# Patient Record
Sex: Male | Born: 1945 | Race: White | Hispanic: No | Marital: Married | Smoking: Former smoker
Health system: Southern US, Community
[De-identification: ages and names within clinical notes are randomized; demographics above are authoritative.]

## PROBLEM LIST (undated history)

## (undated) DIAGNOSIS — T7840XA Allergy, unspecified, initial encounter: Secondary | ICD-10-CM

## (undated) DIAGNOSIS — K589 Irritable bowel syndrome without diarrhea: Secondary | ICD-10-CM

## (undated) DIAGNOSIS — D496 Neoplasm of unspecified behavior of brain: Secondary | ICD-10-CM

## (undated) DIAGNOSIS — C61 Malignant neoplasm of prostate: Secondary | ICD-10-CM

## (undated) DIAGNOSIS — G4730 Sleep apnea, unspecified: Secondary | ICD-10-CM

## (undated) DIAGNOSIS — E7800 Pure hypercholesterolemia, unspecified: Secondary | ICD-10-CM

## (undated) DIAGNOSIS — B029 Zoster without complications: Secondary | ICD-10-CM

## (undated) DIAGNOSIS — G459 Transient cerebral ischemic attack, unspecified: Secondary | ICD-10-CM

## (undated) DIAGNOSIS — I639 Cerebral infarction, unspecified: Secondary | ICD-10-CM

## (undated) DIAGNOSIS — R569 Unspecified convulsions: Secondary | ICD-10-CM

## (undated) DIAGNOSIS — T4145XA Adverse effect of unspecified anesthetic, initial encounter: Secondary | ICD-10-CM

## (undated) DIAGNOSIS — T8859XA Other complications of anesthesia, initial encounter: Secondary | ICD-10-CM

## (undated) HISTORY — PX: JOINT REPLACEMENT: SHX530

## (undated) HISTORY — DX: Allergy, unspecified, initial encounter: T78.40XA

---

## 1978-11-04 HISTORY — PX: UVULOPALATOPHARYNGOPLASTY: SHX827

## 1988-11-03 HISTORY — PX: PROSTATECTOMY: SHX69

## 1998-04-22 ENCOUNTER — Encounter: Payer: Self-pay | Admitting: Neurology

## 1998-04-22 ENCOUNTER — Ambulatory Visit (HOSPITAL_COMMUNITY): Admission: RE | Admit: 1998-04-22 | Discharge: 1998-04-22 | Payer: Self-pay | Admitting: Neurology

## 1998-11-04 HISTORY — PX: SHOULDER ARTHROSCOPY: SHX128

## 1998-11-04 HISTORY — PX: CATARACT EXTRACTION W/ INTRAOCULAR LENS IMPLANT: SHX1309

## 1998-11-04 HISTORY — PX: TOTAL KNEE ARTHROPLASTY: SHX125

## 2000-10-17 ENCOUNTER — Encounter (INDEPENDENT_AMBULATORY_CARE_PROVIDER_SITE_OTHER): Payer: Self-pay | Admitting: Specialist

## 2000-10-17 ENCOUNTER — Other Ambulatory Visit: Admission: RE | Admit: 2000-10-17 | Discharge: 2000-10-17 | Payer: Self-pay | Admitting: Urology

## 2000-10-22 ENCOUNTER — Encounter: Payer: Self-pay | Admitting: Urology

## 2000-10-22 ENCOUNTER — Encounter: Admission: RE | Admit: 2000-10-22 | Discharge: 2000-10-22 | Payer: Self-pay | Admitting: Urology

## 2002-01-23 ENCOUNTER — Encounter (INDEPENDENT_AMBULATORY_CARE_PROVIDER_SITE_OTHER): Payer: Self-pay | Admitting: Specialist

## 2002-01-23 ENCOUNTER — Ambulatory Visit (HOSPITAL_COMMUNITY): Admission: RE | Admit: 2002-01-23 | Discharge: 2002-01-23 | Payer: Self-pay | Admitting: Gastroenterology

## 2002-08-10 ENCOUNTER — Encounter: Admission: RE | Admit: 2002-08-10 | Discharge: 2002-08-25 | Payer: Self-pay | Admitting: Family Medicine

## 2007-12-19 ENCOUNTER — Ambulatory Visit (HOSPITAL_BASED_OUTPATIENT_CLINIC_OR_DEPARTMENT_OTHER): Admission: RE | Admit: 2007-12-19 | Discharge: 2007-12-19 | Payer: Self-pay | Admitting: Specialist

## 2009-03-07 ENCOUNTER — Inpatient Hospital Stay (HOSPITAL_COMMUNITY): Admission: RE | Admit: 2009-03-07 | Discharge: 2009-03-12 | Payer: Self-pay | Admitting: Specialist

## 2009-03-07 ENCOUNTER — Encounter (INDEPENDENT_AMBULATORY_CARE_PROVIDER_SITE_OTHER): Payer: Self-pay | Admitting: Specialist

## 2010-05-21 LAB — BASIC METABOLIC PANEL
BUN: 12 mg/dL (ref 6–23)
BUN: 8 mg/dL (ref 6–23)
CO2: 28 mEq/L (ref 19–32)
CO2: 29 mEq/L (ref 19–32)
CO2: 29 mEq/L (ref 19–32)
Calcium: 8.6 mg/dL (ref 8.4–10.5)
Chloride: 101 mEq/L (ref 96–112)
Chloride: 101 mEq/L (ref 96–112)
Chloride: 101 mEq/L (ref 96–112)
Chloride: 101 mEq/L (ref 96–112)
Creatinine, Ser: 0.81 mg/dL (ref 0.4–1.5)
GFR calc non Af Amer: >60 mL/min (ref >60)
Glucose, Bld: 96 mg/dL (ref 70–99)
Potassium: 3.2 mEq/L — ABNORMAL LOW (ref 3.5–5.1)
Potassium: 3.3 mEq/L — ABNORMAL LOW (ref 3.5–5.1)
Potassium: 3.6 mEq/L (ref 3.5–5.1)
Potassium: 4.1 mEq/L (ref 3.5–5.1)
Sodium: 135 mEq/L (ref 135–145)
Sodium: 136 mEq/L (ref 135–145)
Sodium: 137 mEq/L (ref 135–145)
Sodium: 137 mEq/L (ref 135–145)

## 2010-05-21 LAB — PROTIME-INR
INR: 1.1 (ref 0.00–1.49)
INR: 1.89 — ABNORMAL HIGH (ref 0.00–1.49)
Prothrombin Time: 14.1 seconds (ref 11.6–15.2)
Prothrombin Time: 16.1 seconds — ABNORMAL HIGH (ref 11.6–15.2)

## 2010-05-21 LAB — CBC
HCT: 30.7 % — ABNORMAL LOW (ref 39.0–52.0)
HCT: 32.3 % — ABNORMAL LOW (ref 39.0–52.0)
HCT: 33.1 % — ABNORMAL LOW (ref 39.0–52.0)
Hemoglobin: 10.3 g/dL — ABNORMAL LOW (ref 13.0–17.0)
Hemoglobin: 10.9 g/dL — ABNORMAL LOW (ref 13.0–17.0)
Hemoglobin: 11.1 g/dL — ABNORMAL LOW (ref 13.0–17.0)
Hemoglobin: 12.2 g/dL — ABNORMAL LOW (ref 13.0–17.0)
MCHC: 33.8 g/dL (ref 30.0–36.0)
MCV: 95.6 fL (ref 78.0–100.0)
MCV: 95.8 fL (ref 78.0–100.0)
MCV: 96 fL (ref 78.0–100.0)
Platelets: 211 10*3/uL (ref 150–400)
RBC: 3.37 MIL/uL — ABNORMAL LOW (ref 4.22–5.81)
RBC: 3.44 MIL/uL — ABNORMAL LOW (ref 4.22–5.81)
RBC: 3.64 MIL/uL — ABNORMAL LOW (ref 4.22–5.81)
RBC: 3.83 MIL/uL — ABNORMAL LOW (ref 4.22–5.81)
RDW: 13 % (ref 11.5–15.5)
RDW: 13.5 % (ref 11.5–15.5)
RDW: 13.6 % (ref 11.5–15.5)
WBC: 11.1 10*3/uL — ABNORMAL HIGH (ref 4.0–10.5)
WBC: 8.1 10*3/uL (ref 4.0–10.5)
WBC: 9.6 10*3/uL (ref 4.0–10.5)

## 2010-06-05 LAB — URINALYSIS, ROUTINE W REFLEX MICROSCOPIC
Bilirubin Urine: NEGATIVE
Glucose, UA: NEGATIVE mg/dL
Hgb urine dipstick: NEGATIVE
Ketones, ur: NEGATIVE mg/dL
Protein, ur: NEGATIVE mg/dL

## 2010-06-05 LAB — CBC
HCT: 45.2 % (ref 39.0–52.0)
MCHC: 33.4 g/dL (ref 30.0–36.0)
MCV: 96.3 fL (ref 78.0–100.0)
RBC: 4.69 MIL/uL (ref 4.22–5.81)
WBC: 5.5 10*3/uL (ref 4.0–10.5)

## 2010-06-05 LAB — TYPE AND SCREEN
ABO/RH(D): O POS
Antibody Screen: NEGATIVE

## 2010-06-05 LAB — COMPREHENSIVE METABOLIC PANEL
CO2: 29 mEq/L (ref 19–32)
Calcium: 9.2 mg/dL (ref 8.4–10.5)
Creatinine, Ser: 0.86 mg/dL (ref 0.4–1.5)
GFR calc non Af Amer: >60 mL/min (ref >60)
Glucose, Bld: 61 mg/dL — ABNORMAL LOW (ref 70–99)
Sodium: 141 mEq/L (ref 135–145)
Total Protein: 7.2 g/dL (ref 6.0–8.3)

## 2010-06-05 LAB — PROTIME-INR
INR: 0.99 (ref 0.00–1.49)
Prothrombin Time: 13 seconds (ref 11.6–15.2)

## 2010-06-05 LAB — DIFFERENTIAL
Basophils Absolute: 0 10*3/uL (ref 0.0–0.1)
Lymphocytes Relative: 21 % (ref 12–46)
Lymphs Abs: 1.2 10*3/uL (ref 0.7–4.0)
Neutro Abs: 3.7 10*3/uL (ref 1.7–7.7)
Neutrophils Relative %: 68 % (ref 43–77)

## 2010-06-05 LAB — ABO/RH: ABO/RH(D): O POS

## 2010-06-05 LAB — APTT: aPTT: 32 seconds (ref 24–37)

## 2010-07-04 ENCOUNTER — Ambulatory Visit (HOSPITAL_COMMUNITY)
Admission: RE | Admit: 2010-07-04 | Discharge: 2010-07-04 | Disposition: A | Discharge status: Home / Self Care | Payer: BC Managed Care – PPO | Source: Ambulatory Visit | Attending: Specialist | Admitting: Specialist

## 2010-07-04 ENCOUNTER — Other Ambulatory Visit: Payer: Self-pay | Admitting: Specialist

## 2010-07-04 ENCOUNTER — Other Ambulatory Visit (HOSPITAL_COMMUNITY): Payer: Self-pay | Admitting: Specialist

## 2010-07-04 ENCOUNTER — Encounter (HOSPITAL_COMMUNITY): Payer: BC Managed Care – PPO

## 2010-07-04 DIAGNOSIS — M25819 Other specified joint disorders, unspecified shoulder: Secondary | ICD-10-CM | POA: Insufficient documentation

## 2010-07-04 DIAGNOSIS — Z01810 Encounter for preprocedural cardiovascular examination: Secondary | ICD-10-CM | POA: Insufficient documentation

## 2010-07-04 DIAGNOSIS — Z01812 Encounter for preprocedural laboratory examination: Secondary | ICD-10-CM | POA: Insufficient documentation

## 2010-07-04 DIAGNOSIS — G4730 Sleep apnea, unspecified: Secondary | ICD-10-CM | POA: Insufficient documentation

## 2010-07-04 DIAGNOSIS — I498 Other specified cardiac arrhythmias: Secondary | ICD-10-CM | POA: Insufficient documentation

## 2010-07-04 DIAGNOSIS — C61 Malignant neoplasm of prostate: Secondary | ICD-10-CM | POA: Insufficient documentation

## 2010-07-04 DIAGNOSIS — Z01818 Encounter for other preprocedural examination: Secondary | ICD-10-CM | POA: Insufficient documentation

## 2010-07-04 DIAGNOSIS — M7542 Impingement syndrome of left shoulder: Secondary | ICD-10-CM

## 2010-07-04 LAB — SURGICAL PCR SCREEN: MRSA, PCR: NEGATIVE

## 2010-07-04 LAB — BASIC METABOLIC PANEL
CO2: 30 mEq/L (ref 19–32)
Chloride: 103 mEq/L (ref 96–112)
Creatinine, Ser: 0.99 mg/dL (ref 0.4–1.5)
GFR calc Af Amer: >60 mL/min (ref >60)

## 2010-07-04 LAB — CBC
HCT: 43.8 % (ref 39.0–52.0)
Hemoglobin: 14.4 g/dL (ref 13.0–17.0)
MCHC: 32.9 g/dL (ref 30.0–36.0)
MCV: 94.6 fL (ref 78.0–100.0)

## 2010-07-13 ENCOUNTER — Ambulatory Visit (HOSPITAL_COMMUNITY)
Admission: RE | Admit: 2010-07-13 | Discharge: 2010-07-13 | Disposition: A | Discharge status: Home / Self Care | Payer: BC Managed Care – PPO | Source: Ambulatory Visit | Attending: Specialist | Admitting: Specialist

## 2010-07-13 DIAGNOSIS — Z79899 Other long term (current) drug therapy: Secondary | ICD-10-CM | POA: Insufficient documentation

## 2010-07-13 DIAGNOSIS — M19019 Primary osteoarthritis, unspecified shoulder: Secondary | ICD-10-CM | POA: Insufficient documentation

## 2010-07-13 DIAGNOSIS — G40909 Epilepsy, unspecified, not intractable, without status epilepticus: Secondary | ICD-10-CM | POA: Insufficient documentation

## 2010-07-13 DIAGNOSIS — S43429A Sprain of unspecified rotator cuff capsule, initial encounter: Secondary | ICD-10-CM | POA: Insufficient documentation

## 2010-07-13 DIAGNOSIS — X58XXXA Exposure to other specified factors, initial encounter: Secondary | ICD-10-CM | POA: Insufficient documentation

## 2010-07-13 DIAGNOSIS — M25819 Other specified joint disorders, unspecified shoulder: Secondary | ICD-10-CM | POA: Insufficient documentation

## 2010-07-13 DIAGNOSIS — M67919 Unspecified disorder of synovium and tendon, unspecified shoulder: Secondary | ICD-10-CM | POA: Insufficient documentation

## 2010-07-13 DIAGNOSIS — M719 Bursopathy, unspecified: Secondary | ICD-10-CM | POA: Insufficient documentation

## 2010-07-18 NOTE — Op Note (Signed)
NAME:  Rodney Potts, Rodney Potts NO.:  [id number]   MEDICAL RECORD NO.:  [id number]          PATIENT TYPE:  AMB   LOCATION:  [facility]                         FACILITY:  [facility]   PHYSICIAN:  Jene Every, M.D.    DATE OF BIRTH:  1945/12/18   DATE OF PROCEDURE:  12/19/2007  DATE OF DISCHARGE:                               OPERATIVE REPORT   PREOPERATIVE DIAGNOSIS:  Impingement syndrome, rotator cuff arthropathy,  interstitial tear in the rotator cuff, arthritis.   POSTOPERATIVE DIAGNOSIS:  Impingement syndrome, rotator cuff  arthropathy, interstitial tear in the rotator cuff, arthritis, glenoid  labral tear.   PROCEDURE:  1. Right shoulder exam under anesthesia.  2. Right shoulder arthroscopy, debridement of glenoid labral tear,      debridement of glenohumeral joint.  3. Subacromial decompression with bursectomy.  4. Debridement of partial tear of the rotator cuff.   SURGEON:  Jene Every, M.D.   ASSISTANT:  Roma Schanz, P.A.   INDICATION:  This is a 65 year old with shoulder pain refractory.  MRI  indicating degenerative arthritis, interstitial tear of the rotator  cuff.  Refractory to conservative treatment including corticosteroid  injection, activity modification, partial relief from a subacromial  injection who is indicated for arthroscopic debridement, subacromial  decompression, possible rotator cuff repair.  Risks and benefits  discussed including bleeding, infection, damage to adjacent structures,  no change in symptoms, worsening symptoms, need for repeat debridement,  open rotator cuff or debridement of rotator cuff in the future.   TECHNIQUE:  With the patient in supine beach chair position after  adequate induction of general anesthesia and a gram of vanco the right  shoulder and upper extremity was prepped and draped in the usual sterile  fashion.  Range of motion was examined on the patient.  He had slightly  decreased forward flexion otherwise  unremarkable.  Surgical marker was  utilized to delineate the acromion and coracoid.  Incision was made  through the skin only in the posterolateral corner, the anterolateral  aspect of the lateral to the acromion and anteriorly where the standard  anterior lateral and posterior portals were placed.  The arm in a 70/30  position.  We guided the arthroscopic cannula into the glenohumeral  joint, slightly tight though not overly, penetrating atraumatically.  Through the anterior portal under direct visualization we introduced a  cannula blunt beneath the biceps penetrating the capsule atraumatically.  There were significant degenerative changes of glenohumeral joint and a  degenerative tearing of the anterior and superior part of the labrum.  This was debrided with a shaver to a stable base.  The joint was lavaged  as well as some light debridement of glenoid.  We evaluated the biceps  tendon, mild tendinopathy, mild fraying of the rotator cuff, no full  tear.  We examined it in internal and external rotation.  Subscap was  unremarkable.   We redirected the camera in the subacromial space and the anterior  portal was switched to the lateral portal.  Exuberant hypertrophic bursa  was noted in the subacromial space.  We therefore then performed a full  bursectomy.  Some tightness anteriorly.  We therefore released the CA  ligament and morselized it with an ArthroWand.  Next we examined  anteriorly and posteriorly and laterally.  There was a small tearing of  the rotator cuff out laterally.  It was not significant, perhaps 10% of  tendon thickness.  This was debrided with a light shaving.  Internal and  external rotation and full evaluation and no evidence of full thickness  tear was noted.  This was probed extensively.   We therefore decided with the release of the CA ligament and bursectomy  hopefully this will go on to healing.  There was no evidence of a  significant spur in the acromion  noted here after the release of the CA  ligament.   Next the shoulder was copiously lavaged and all instrumentation was  removed.  Portals were closed with 4-0 nylon simple suture.  Marcaine  0.25% with epinephrine was infiltrated in the joint.  The wound was  dressed sterilely and was awakened without difficulty and transported to  recovery in satisfactory condition.  The patient tolerated the  procedure.  No complications.      Jene Every, M.D.  Electronically Signed     JB/MEDQ  D:  12/19/2007  T:  12/19/2007  Job:  [id number]

## 2010-07-21 NOTE — Op Note (Signed)
NAME:  Rodney Potts, Rodney Potts NO.:  [id number]   MEDICAL RECORD NO.:  [id number]                   PATIENT TYPE:  AMB   LOCATION:  ENDO                                 FACILITY:  [facility]   PHYSICIAN:  Petra Kuba, M.D.                 DATE OF BIRTH:  December 26, 1945   DATE OF PROCEDURE:  01/23/2002  DATE OF DISCHARGE:                                 OPERATIVE REPORT   PROCEDURE:  Colonoscopy with polypectomy.   INDICATIONS:  Patient with multiple cancers and tumors, due for colonic  screening.  Consent was signed after risks, benefits, methods, and options  thoroughly discussed in the office.   MEDICATIONS:  Demerol 60 mg, Versed 5 mg.   DESCRIPTION OF PROCEDURE:  Rectal inspection was pertinent for external  hemorrhoids, small.  Digital exam was negative.  The video pediatric  adjustable colonoscope was inserted and advanced around the colon to the  cecum.  This did require rolling him on his back and some abdominal  pressure.  On insertion in the proximal level of the hepatic flexure, a  small polyp was seen which was snared, electrocautery applied, and the polyp  was suctioned through the scope and collected in the trap.  The cecum was  identified by the appendiceal orifice and the ileocecal valve.  In fact, the  scope was inserted a short way into the terminal ileum, which was normal.  Photo documentation was obtained, and the scope was slowly withdrawn.  Prep  was adequate.  There was some liquid stool that required washing and  suctioning.  On slow withdrawal through the colon, in the midascending a  questionable tiny polyp was seen.  It was cold biopsied x2 and put in the  same container with the polyp snared on insertion.  The scope was slowly  withdrawn.  There were a few other tiny questionable polyps in the  descending and sigmoid and rectum, which appeared hyperplastic and were cold  biopsied and put in the same container.  Also in the rectum was a  small  polyp that was snared, electrocautery applied.  This polyp was suctioned  through the scope, collected in the trap, and this was put in a separate  container.  No other abnormalities were seen as we slowly withdrew back to  the rectum.  Once back in the rectum the scope was then retroflexed,  pertinent for some internal hemorrhoids.  The scope was straightened and  readvanced a short way up the left side of the colon, air was suctioned, the  scope removed.  The patient tolerated the procedure well.  There was no  obvious immediate complication.   ENDOSCOPIC DIAGNOSES:  1. Internal-external hemorrhoids.  2. Small rectal polyp, snared, put in container #2.  3. Multiple tiny probably hyperplastic-appearing polyps in the rectum,     descending, sigmoid, and ascending, all cold biopsied.  4. Small  hepatic flexure polyp snared and removed on insertion.  5. Otherwise within normal limits to the terminal ileum.   PLAN:  Await pathology to determine future colonic screening.  Happy to see  back p.r.n., otherwise return care to Dr. Andrey Campanile for the customary health  care maintenance to include yearly rectals and guaiacs.                                              Petra Kuba, M.D.     MEM/MEDQ  D:  01/23/2002  T:  01/23/2002  Job:  [id number]   cc:   Gloriajean Dell. Andrey Campanile, M.D.

## 2010-07-23 NOTE — Op Note (Signed)
NAME:  Rodney Potts, Rodney Potts NO.:  [id number]  MEDICAL RECORD NO.:  [id number]           PATIENT TYPE:  O  LOCATION:  [facility]                         FACILITY:  [facility]  PHYSICIAN:  Jene Every, M.D.    DATE OF BIRTH:  1945-07-08  DATE OF PROCEDURE:  07/13/2010 DATE OF DISCHARGE:                              OPERATIVE REPORT   PREOPERATIVE DIAGNOSES:  Impingement syndrome, left shoulder; acromioclavicular arthrosis; possible rotator cuff tear.  POSTOPERATIVE DIAGNOSES: 1. Glenohumeral arthrosis with anterior-superior labral tear. 2. Impingement syndrome. 3. Acromioclavicular arthrosis.  PROCEDURES PERFORMED: 1. Left shoulder arthroscopy with debridement of the anterior and     superior labrum and bicipital tendonitis. 2. Subacromial decompression, acromioplasty, repair of rotator cuff     tear. 3. Mini-open distal clavicle resection.  ANESTHESIA:  General.  ASSISTANT:  Roma Schanz, P.A.  BRIEF HISTORY:  The patient is a 65 year old with shoulder pain, AC arthrosis, impingement pain, glenohumeral arthrosis, indicated for arthroscopic debridement, distal clavicle resection, possible open rotator cuff repair.  Risks and benefits discussed including bleeding, infection, no change in symptoms, worsening of symptoms, need for repeat debridement, DVT, PE, anesthetic complications, etc.  TECHNIQUE:  The patient in supine beach-chair position, adequate general anesthesia, 1 g of Kefzol, shoulder was examined, full range.  Left shoulder and upper extremity was prepped and draped in the usual sterile fashion.  Surgical marker was utilized to delineate the acromion, AC joint, coracoid.  Standard posterolateral, anterolateral incisions were made through the skin with the arm in 70/30 position.  We advanced the cannula into glenohumeral joint penetrating atraumatically.  An 18 gauge needle was advanced in the glenohumeral joint beneath the biceps tendon. I made a  small incision and inserted cannula and 3.5 Cuda shaver.  He had significant glenohumeral arthrosis, multiple grade 3 changes, mild grade 4 changes and degenerative fraying and tearing of the labrum, bicipital tendonitis, and partial tears of the biceps tendon.  We debrided the labrum and tendonitis and the synovitis around the tendon sheath.  He did not have a detachment of his labrum.  Felt the majority of the biceps was intact.  We preserved the mechanism, we lavaged the glenohumeral joint.  Rotator cuff was not torn from beneath the subscap, was unremarkable as well, redirected camera in subacromial space. Hypertrophic bursa and synovitis was noted, performed a bursectomy with mild fraying of the biceps tendon that was over the rotator cuff and the supraspinatus was lightly debrided.  Good bleeding tissue.  Less than 10% thickness.  Next we evaluated the rotator cuff, unable to deliver the subacromial space, decided to perform a mini-open rotator cuff repair, made a small incision over the clavicle.  Subcutaneous tissue was dissected.Electrocautery was utilized to achieve hemostasis.  Deltotrapezial fascia and the capsule was divided in line with the skin incision.  We skeletonized distal clavicle, removed 1.5 cm of distal clavicle with an oscillating saw, protecting anteriorly, posteriorly and the rotator cuff.  We then undercut the distal clavicle.  Large anterior spur which was impinging upon the cuff of the supraspinatus muscle.  We removed that with 3 mm Kerrison, copiously irrigated the  wound, no active bleeding, bone wax over the distal clavicle.  It was stable.  We preserved the coracoclavicular ligaments, repaired the capsule with 0 Vicryl interrupted figure-of-8 sutures, subcu with 2-0 Vicryl simple sutures.  Skin was reapproximated with 4-0 subcuticular Prolene.  Wound reinforced with Steri-Strips.  Sterile dressing applied.  We injected Marcaine with epinephrine in the  subacromial joint, entered the glenohumeral joint.  Sterile dressing applied, placing a sling, extubated without difficulty and transported to the recovery in satisfactory condition.  The patient tolerated the procedure well.  No complications.  Minimal blood loss.     Jene Every, M.D.     [name]  D:  07/13/2010  T:  07/13/2010  Job:  [id number]  Electronically Signed by Jene Every M.D. on 07/23/2010 01:30:18 PM

## 2010-09-15 ENCOUNTER — Ambulatory Visit (HOSPITAL_BASED_OUTPATIENT_CLINIC_OR_DEPARTMENT_OTHER): Admission: RE | Admit: 2010-09-15 | Payer: BC Managed Care – PPO | Source: Ambulatory Visit | Admitting: Specialist

## 2010-10-23 ENCOUNTER — Other Ambulatory Visit (HOSPITAL_COMMUNITY): Payer: Self-pay | Admitting: Specialist

## 2010-10-23 ENCOUNTER — Encounter (HOSPITAL_COMMUNITY): Payer: BC Managed Care – PPO

## 2010-10-23 ENCOUNTER — Other Ambulatory Visit: Payer: Self-pay | Admitting: Specialist

## 2010-10-23 ENCOUNTER — Ambulatory Visit (HOSPITAL_COMMUNITY)
Admission: RE | Admit: 2010-10-23 | Discharge: 2010-10-23 | Disposition: A | Discharge status: Home / Self Care | Payer: BC Managed Care – PPO | Source: Ambulatory Visit | Attending: Specialist | Admitting: Specialist

## 2010-10-23 DIAGNOSIS — M1710 Unilateral primary osteoarthritis, unspecified knee: Secondary | ICD-10-CM | POA: Insufficient documentation

## 2010-10-23 DIAGNOSIS — Z01812 Encounter for preprocedural laboratory examination: Secondary | ICD-10-CM | POA: Insufficient documentation

## 2010-10-23 DIAGNOSIS — M25469 Effusion, unspecified knee: Secondary | ICD-10-CM | POA: Insufficient documentation

## 2010-10-23 DIAGNOSIS — Z01818 Encounter for other preprocedural examination: Secondary | ICD-10-CM

## 2010-10-23 LAB — CBC
Platelets: 225 10*3/uL (ref 150–400)
RBC: 4.75 MIL/uL (ref 4.22–5.81)
RDW: 13.8 % (ref 11.5–15.5)
WBC: 6.7 10*3/uL (ref 4.0–10.5)

## 2010-10-23 LAB — URINALYSIS, ROUTINE W REFLEX MICROSCOPIC
Bilirubin Urine: NEGATIVE
Hgb urine dipstick: NEGATIVE
Ketones, ur: NEGATIVE mg/dL
Specific Gravity, Urine: 1.025 (ref 1.005–1.030)
Urobilinogen, UA: 0.2 mg/dL (ref 0.0–1.0)
pH: 6 (ref 5.0–8.0)

## 2010-10-23 LAB — COMPREHENSIVE METABOLIC PANEL
ALT: 11 U/L (ref 0–53)
AST: 18 U/L (ref 0–37)
Alkaline Phosphatase: 63 U/L (ref 39–117)
CO2: 32 mEq/L (ref 19–32)
Calcium: 10.1 mg/dL (ref 8.4–10.5)
GFR calc non Af Amer: >60 mL/min (ref >60)
Glucose, Bld: 96 mg/dL (ref 70–99)
Potassium: 4.7 mEq/L (ref 3.5–5.1)
Sodium: 139 mEq/L (ref 135–145)

## 2010-10-23 LAB — DIFFERENTIAL
Basophils Absolute: 0 10*3/uL (ref 0.0–0.1)
Eosinophils Relative: 1 % (ref 0–5)
Lymphocytes Relative: 22 % (ref 12–46)
Lymphs Abs: 1.5 10*3/uL (ref 0.7–4.0)
Neutro Abs: 4.5 10*3/uL (ref 1.7–7.7)
Neutrophils Relative %: 67 % (ref 43–77)

## 2010-10-23 LAB — URINE MICROSCOPIC-ADD ON

## 2010-10-23 LAB — SURGICAL PCR SCREEN: MRSA, PCR: NEGATIVE

## 2010-10-23 LAB — APTT: aPTT: 32 seconds (ref 24–37)

## 2010-10-23 LAB — PROTIME-INR: INR: 1.04 (ref 0.00–1.49)

## 2010-11-02 ENCOUNTER — Inpatient Hospital Stay (HOSPITAL_COMMUNITY): Payer: BC Managed Care – PPO

## 2010-11-02 ENCOUNTER — Inpatient Hospital Stay (HOSPITAL_COMMUNITY)
Admission: RE | Admit: 2010-11-02 | Discharge: 2010-11-05 | DRG: 209 | Disposition: A | Discharge status: Home Health | Payer: BC Managed Care – PPO | Source: Ambulatory Visit | Attending: Specialist | Admitting: Specialist

## 2010-11-02 DIAGNOSIS — Z96659 Presence of unspecified artificial knee joint: Secondary | ICD-10-CM

## 2010-11-02 DIAGNOSIS — M1710 Unilateral primary osteoarthritis, unspecified knee: Principal | ICD-10-CM | POA: Diagnosis present

## 2010-11-02 DIAGNOSIS — Z01812 Encounter for preprocedural laboratory examination: Secondary | ICD-10-CM

## 2010-11-02 DIAGNOSIS — Z8546 Personal history of malignant neoplasm of prostate: Secondary | ICD-10-CM

## 2010-11-02 LAB — TYPE AND SCREEN

## 2010-11-03 LAB — BASIC METABOLIC PANEL
BUN: 11 mg/dL (ref 6–23)
Chloride: 101 mEq/L (ref 96–112)
GFR calc non Af Amer: >60 mL/min (ref >60)
Glucose, Bld: 111 mg/dL — ABNORMAL HIGH (ref 70–99)
Potassium: 4.3 mEq/L (ref 3.5–5.1)

## 2010-11-03 LAB — CBC
HCT: 41.6 % (ref 39.0–52.0)
Hemoglobin: 13.8 g/dL (ref 13.0–17.0)
MCHC: 33.2 g/dL (ref 30.0–36.0)

## 2010-11-03 LAB — URINALYSIS, ROUTINE W REFLEX MICROSCOPIC
Bilirubin Urine: NEGATIVE
Hgb urine dipstick: NEGATIVE
Specific Gravity, Urine: 1.015 (ref 1.005–1.030)
pH: 5.5 (ref 5.0–8.0)

## 2010-11-03 NOTE — H&P (Signed)
NAME:  Rodney Potts, Rodney Potts NO.:  [id number]  MEDICAL RECORD NO.:  [id number]  LOCATION:                                 FACILITY:  PHYSICIAN:  Jene Every, M.D.    DATE OF BIRTH:  Nov 14, 1945  DATE OF ADMISSION:  11/02/2010 DATE OF DISCHARGE:                             HISTORY & PHYSICAL   CHIEF COMPLAINT:  Left knee pain.  HISTORY:  Rodney Potts is a 65 year old gentleman is well known to our practice.  He has had previous right knee arthroplasty in the past and did well from this.  He has now developed recurrent left knee pain.  We proceeded with conservative treatment including injections without significant relief.  Dr. Shelle Iron did recommend arthroscopy for his knee medial meniscal tear; however, the patient preferred to proceed with a total knee replacement.  He did not want to undergo multiple treatments for his knee.  X-rays do show a valgus deformity as well as significant medial meniscal tear, so at this time, the patient would benefit from total knee arthroplasty.  The risks and benefits of the surgery were discussed with the patient.  He does wish to proceed.  PAST MEDICAL HISTORY:  Significant for: 1. Brain tumor. 2. Sleep apnea. 3. IBS. 4. Prostate cancer.  CURRENT MEDICATIONS: 1. Phenobarbital 2 tablets at bedtime. 2. Keppra 1 p.o. b.i.d.  ALLERGIES: 1. SULFA. 2. AMOXICILLIN. 3. DILAUDID cause delusions. 4. He is sensitive to LACTOSE.  PREVIOUS SURGERIES: 1. Prostate surgery. 2. Soft palate. 3. Right total knee and shoulder arthroscopy.  SOCIAL HISTORY:  The patient is married.  He is retired.  He does drink occasional alcohol.  He did smoke.  PRIMARY CARE PHYSICIAN:  Dr. Benedetto Goad.  He plans to go home with his wife following surgery.  FAMILY HISTORY:  His father deceased at 55 from melanoma complications. Mother with history of breast cancer and colon disease.  REVIEW OF SYSTEMS:  GENERAL:  The patient denies any fever,  chills, night sweats, or bleeding tendencies.  CNS:  The patient does note balance disturbance.  No blurred double vision, seizure, headache, or paralysis.  RESPIRATORY:  The patient has occasional cough.  Denies any shortness of breath.  He denies any chest pain or palpitations.  GI:  He does note occasional diarrhea.  No nausea, vomiting, or melena.  GU:  He does note urinary incontinence.  No frequency or painful urination. MUSCULOSKELETAL:  As per in HPI.  PHYSICAL EXAMINATION:  GENERAL:  This healthy gentleman, seen upright in no acute distress.  He does have antalgic gait.  He has a very blunt affect. HEENT:  Atraumatic. NECK:  Supple.  No lymphadenopathy. CHEST:  Clear to auscultation bilaterally. HEART:  Regular rate and rhythm without murmurs, gallops, or rubs. ABDOMEN:  Soft, nontender, and nondistended. SKIN:  No rashes or lesions noted. MUSCULOSKELETAL:  In regarding to the left knee, there is mild effusion noted.  He is tender along the medial joint line.  Range of motion 0 to 140.  He has slight laxity with valgus stressing.  IMPRESSION:  Degenerative joint disease, left knee.  PLAN:  The patient will be admitted and to undergo a left  total knee arthroplasty.     Roma Schanz, P.A.   ______________________________ Jene Every, M.D.    CS/MEDQ  D:  11/01/2010  T:  11/01/2010  Job:  [id number]  Electronically Signed by Roma Schanz P.A. on 11/03/2010 12:13:43 PM Electronically Signed by Jene Every M.D. on 11/03/2010 02:42:06 PM

## 2010-11-03 NOTE — Op Note (Signed)
NAME:  Rodney Potts, Rodney Potts NO.:  [id number]  MEDICAL RECORD NO.:  [id number]  LOCATION:  1614                         [facility]ACILITY:  [facility]  PHYSICIAN:  Jene Every, M.D.    DATE OF BIRTH:  Jul 13, 1945  DATE OF PROCEDURE:  11/02/2010 DATE OF DISCHARGE:                              OPERATIVE REPORT   PREOPERATIVE DIAGNOSIS:  Degenerative joint disease of left knee, bone- on-bone arthritis, lateral compartment, slight valgus deformity.  POSTOPERATIVE DIAGNOSIS:  Degenerative joint disease of left knee, bone- on-bone arthritis, lateral compartment, slight valgus deformity.  PROCEDURES PERFORMED:  Left total knee arthroplasty utilizing DePuy rotating platform, 5 femur, 6 tibia 10 mm insert, 41 patella, 10 polyethylene.  HISTORY:  65 year old with end-stage osteoarthrosis, bone-on-bone, lateral compartment, varus deformity, refractory to conservative treatment was indicated for replacement of degenerated joint, failing conservative treatment including therapy, corticosteroid injection, activity modification, and assisted devices.  Risks and benefits discussed including bleeding, infection, damage to neurovascular structures, no change in symptoms, worsening symptoms, need for revision, DVT, PE, anesthetic complications, etc.  TECHNIQUE:  With the patient in supine position, after adequate general anesthesia 1 gram of vancomycin, left lower extremity was prepped and draped in usual sterile fashion.  Thigh tourniquet inflated to 300 mmHg after it was exsanguinated.  Midline incision was made over the patella. Full-thickness flaps developed.  Median parapatellar arthrotomy performed.  Patella was everted, knee was flexed, tricompartmental osteoarthrosis was noted, severe in the lateral compartment, bone-on- bone, removed the median and lateral menisci remnants.  We then elevated soft tissue medially, removed osteotomes with a Beyer rongeur.  Step drill was utilized,  entered the femoral canal used a 5 degree left, 11 off the distal femur and with a slight contracture.  Soft tissues protected, performed a cut.  We completed the femur by sizing off the anterior cortex of the femur to 5.  This was pinned to 3 degrees of external rotation.  Oscillating saw utilized to perform the anterior- posterior chamfer cuts and soft tissues protected at all times. Attention turned then towards the tibia was subluxed with McHale's, medial and lateral menisci remnants removed, cauterized the geniculate. Using 2-degree posterior slope, external alignment guide bisecting the ankle.  We chose two of the defects which were laterally, pinned, performed a proximal tibial cut.  Then we incised in flexion/extension with extension block, they were equivalent and stable in varus-valgus stressing, 0 and 30 degrees.  Once patient's all trials were removed, attention turned back toward the tibia and subluxed it, actually had fairly good bone and decided not to perform the revision tray.  Sized to a 6 with maximal coverage, noted just over the medial tibial tubercle.  This was then pinned. Central drill utilized, punch guide utilized as well, returned attention back towards the tibia, performed a box cut jig bisecting the canal, pinned and used the oscillating saw to perform the box cut.  Then we used a trial femur, tibia 10 mm insert.  We had slightly off neutral from the extension, with full flexion, good stability, varus-valgus stressing 0 to 30 degrees, good patellofemoral tracking, and the patella measured at 26, __________ 15, performed a patellar cut and then drilled  3 peg holes, medializing the patellar component.  We trialed the patella and there was good patellofemoral tracking.  All instrumentation was then removed.  There was a slight flexion and extension.  I used a release posterior capsule. Soft tissues protected.  I then used pulsatile lavage cleaning all bony  surfaces from the bone marrow.  I then flexed the knee, dried the surfaces after mixing the cement in the appropriate fashion.  We injected into the tibial canal, digitally pressurized the canals and cemented the proximal tibial component at 6.  Cemented the femur.  Cemented the __________ .  Redundant cement removed.  Placed a 10 insert, brought into extension and provided an axial load throughout the case.  We had full extension.  During this curing we cemented the patella as well.  After curing with the cement, we flexed knee and removed the insert.  We had actually good flexion, good extension, good stability, varus-valgus stressing 0 to 30 degrees.  We removed all redundant cement, copiously irrigated the joint, low-pressure pulsatile lavage with bone wax on the free cancellous surfaces.  I then placed 10 permanent insert.  Again, full extension, full flexion, good stability, varus valgus stressing 0 to 30 degrees, negative anterior drawer at 90 degrees.  We then copiously irrigated with warm saline, placed Hemovac and brought out through a lateral stab wound of the skin.  We injected Marcaine with epinephrine into periosteum over the femur and repairedthe patellar arthrotomy with #1 Vicryl interrupted figure-of-eight sutures with slight flexion, subcu with 2-0 Vicryl simple sutures.  Skin was reapproximated with staples with flexion to gravity at 90 degrees. Sterile dressing was applied.  Tourniquet was deflated with adequate vascularization of lower extremity appreciated.  The patient tolerated the procedure well.  No complications.  ASSISTANT:  Roma Schanz, P.A.  TOURNIQUET TIME:  1 hour and 45 minutes.     Jene Every, M.D.     [name]  D:  11/02/2010  T:  11/03/2010  Job:  [id number]  Electronically Signed by Jene Every M.D. on 11/03/2010 02:42:09 PM

## 2010-11-04 LAB — BASIC METABOLIC PANEL
BUN: 13 mg/dL (ref 6–23)
CO2: 29 mEq/L (ref 19–32)
Chloride: 101 mEq/L (ref 96–112)
Glucose, Bld: 98 mg/dL (ref 70–99)
Potassium: 3.7 mEq/L (ref 3.5–5.1)

## 2010-11-04 LAB — CBC
HCT: 37.2 % — ABNORMAL LOW (ref 39.0–52.0)
Hemoglobin: 12.1 g/dL — ABNORMAL LOW (ref 13.0–17.0)
MCH: 30.9 pg (ref 26.0–34.0)
MCHC: 32.5 g/dL (ref 30.0–36.0)

## 2010-11-05 LAB — BASIC METABOLIC PANEL
BUN: 12 mg/dL (ref 6–23)
CO2: 29 mEq/L (ref 19–32)
Calcium: 8.5 mg/dL (ref 8.4–10.5)
Creatinine, Ser: 0.88 mg/dL (ref 0.50–1.35)
GFR calc Af Amer: >60 mL/min (ref >60)

## 2010-11-05 LAB — CBC
MCH: 31.2 pg (ref 26.0–34.0)
MCV: 94.5 fL (ref 78.0–100.0)
Platelets: 187 10*3/uL (ref 150–400)
RDW: 14 % (ref 11.5–15.5)

## 2010-12-01 NOTE — Discharge Summary (Signed)
NAMEMarland Kitchen  FLORIAN, CHAUCA NO.:  [id number]  MEDICAL RECORD NO.:  [id number]  LOCATION:  1614                         FACILITY:  [facility]  PHYSICIAN:  Jene Every, M.D.    DATE OF BIRTH:  1945-11-17  DATE OF ADMISSION:  11/02/2010 DATE OF DISCHARGE:  11/05/2010                              DISCHARGE SUMMARY   ADMISSION DIAGNOSES: 1. Degenerative joint disease, left knee. 2. Previous brain tumor. 3. Sleep apnea. 4. Corrected irritable bowel syndrome. 5. Prostate cancer.  DISCHARGE DIAGNOSES: 1. Degenerative joint disease, left knee. 2. Previous brain tumor. 3. Sleep apnea. 4. Corrected irritable bowel syndrome. 5. Prostate cancer. 6. Status post left total knee arthroplasty.  HISTORY:  Rodney Potts is a pleasant 65 year old gentleman who is well- known to our practice.  He has had previous right knee arthroplasty in the past and did well from this.  He has now developed persistent left knee pain, which has failed conservative treatment.  Radiographs do reveal end-stage osteoarthritis of knee with valgus deformity.  It is felt at this time, the patient would benefit from a total knee arthroplasty.  The risks and benefits of the surgery were discussed with the patient.  He does elect to proceed.  PROCEDURE:  The patient was taken to the OR, underwent left total knee arthroplasty.  SURGEON:  Jene Every, MD  ASSISTANT:  Roma Schanz, PA-C  ANESTHESIA:  General.  COMPLICATIONS:  None.  CONSULTS:  PT/OT, Care Management.  POSTOPERATIVE LABORATORIES:  Showed a white cell count 11.3, hemoglobin 13.8.  This was monitored throughout the hospital stay.  White cell count normalized at time of discharge at 9.6, hemoglobin stable at 10.7, hematocrit 32.4.  Routine chemistries done postoperatively showed sodium 138, potassium 4.3 with normal BUN and creatinine.  These were monitored throughout the hospital stay.  Sodium remained stable.   Potassium slightly decreased at time of discharge 3.4.  BUN remained within normal range.  GFR is greater than 60.  Postoperative urinalysis was negative.  HOSPITAL COURSE:  The patient was admitted, taken to the OR, and underwent the above-stated procedure.  One Hemovac drain was placed intraoperatively.  He was then transferred to PACU and then to the orthopedic floor for continued postoperative care.  He was placed on PCA for pain relief.  Postop day #1, the patient was doing fairly well.  He did note some stiffness in the knee.  Otherwise, no complaints.  Denied any nausea, vomiting.  Vital signs were stable.  He was afebrile. Hemovac was removed, tip intact.  Xarelto was started per Pharmacy. Discharge planning was initiated.  The patient did well over the course of the next few days.  Postop day #2, dressing was changed.  Incision was clean, dry, and intact.  Postop day #3, it was felt at this time the patient was stable to be discharged home.  DISPOSITION:  He is stable to be discharged home with all home health needs met.  He is to follow with Dr. Shelle Iron in approximately 10 to 14 days for suture removal and x-ray.  ACTIVITIES:  To ambulate as tolerated utilizing fall precautions. Continue to use the knee immobilizer doing straight leg raise x10, ice  and elevate legs 6 times a day for 20 minutes at a time.  DIET:  As tolerated.  CONDITION ON DISCHARGE:  Stable.  MEDICATIONS:  As per med rec sheet.  The patient will need to be on Xarelto for a total of 3 weeks.  FINAL DIAGNOSIS:  Doing well, status post left total knee arthroplasty.     Roma Schanz, P.A.   ______________________________ Jene Every, M.D.    CS/MEDQ  D:  11/24/2010  T:  11/24/2010  Job:  [id number]  Electronically Signed by Roma Schanz P.A. on 11/27/2010 12:14:28 PM Electronically Signed by Jene Every M.D. on 12/01/2010 01:52:41 PM

## 2010-12-05 LAB — CBC
MCHC: 32.8
Platelets: 218
RBC: 4.3

## 2010-12-05 LAB — BASIC METABOLIC PANEL
BUN: 15
CO2: 30
Calcium: 9.2
Creatinine, Ser: 0.9
GFR calc Af Amer: >60

## 2011-01-19 ENCOUNTER — Other Ambulatory Visit: Payer: Self-pay | Admitting: Family Medicine

## 2011-01-19 DIAGNOSIS — R270 Ataxia, unspecified: Secondary | ICD-10-CM

## 2011-01-22 ENCOUNTER — Ambulatory Visit
Admission: RE | Admit: 2011-01-22 | Discharge: 2011-01-22 | Disposition: A | Discharge status: Home / Self Care | Payer: BC Managed Care – PPO | Source: Ambulatory Visit | Attending: Family Medicine | Admitting: Family Medicine

## 2011-01-22 DIAGNOSIS — R270 Ataxia, unspecified: Secondary | ICD-10-CM

## 2011-03-24 ENCOUNTER — Emergency Department (HOSPITAL_COMMUNITY): Payer: BC Managed Care – PPO

## 2011-03-24 ENCOUNTER — Other Ambulatory Visit: Payer: Self-pay

## 2011-03-24 ENCOUNTER — Inpatient Hospital Stay (HOSPITAL_COMMUNITY)
Admission: EM | Admit: 2011-03-24 | Discharge: 2011-03-27 | DRG: 014 | Disposition: A | Discharge status: Home / Self Care | Payer: BC Managed Care – PPO | Attending: Internal Medicine | Admitting: Internal Medicine

## 2011-03-24 ENCOUNTER — Encounter (HOSPITAL_COMMUNITY): Payer: Self-pay | Admitting: Emergency Medicine

## 2011-03-24 DIAGNOSIS — R29810 Facial weakness: Secondary | ICD-10-CM | POA: Diagnosis present

## 2011-03-24 DIAGNOSIS — Z880 Allergy status to penicillin: Secondary | ICD-10-CM

## 2011-03-24 DIAGNOSIS — Z8546 Personal history of malignant neoplasm of prostate: Secondary | ICD-10-CM

## 2011-03-24 DIAGNOSIS — R209 Unspecified disturbances of skin sensation: Secondary | ICD-10-CM | POA: Diagnosis present

## 2011-03-24 DIAGNOSIS — G40909 Epilepsy, unspecified, not intractable, without status epilepticus: Secondary | ICD-10-CM | POA: Diagnosis present

## 2011-03-24 DIAGNOSIS — Z8673 Personal history of transient ischemic attack (TIA), and cerebral infarction without residual deficits: Secondary | ICD-10-CM

## 2011-03-24 DIAGNOSIS — R531 Weakness: Secondary | ICD-10-CM

## 2011-03-24 DIAGNOSIS — R5381 Other malaise: Secondary | ICD-10-CM | POA: Diagnosis present

## 2011-03-24 DIAGNOSIS — R569 Unspecified convulsions: Secondary | ICD-10-CM | POA: Diagnosis present

## 2011-03-24 DIAGNOSIS — Z791 Long term (current) use of non-steroidal anti-inflammatories (NSAID): Secondary | ICD-10-CM

## 2011-03-24 DIAGNOSIS — I6350 Cerebral infarction due to unspecified occlusion or stenosis of unspecified cerebral artery: Principal | ICD-10-CM | POA: Diagnosis present

## 2011-03-24 DIAGNOSIS — Z888 Allergy status to other drugs, medicaments and biological substances status: Secondary | ICD-10-CM

## 2011-03-24 DIAGNOSIS — E785 Hyperlipidemia, unspecified: Secondary | ICD-10-CM | POA: Diagnosis present

## 2011-03-24 DIAGNOSIS — R200 Anesthesia of skin: Secondary | ICD-10-CM

## 2011-03-24 DIAGNOSIS — G4733 Obstructive sleep apnea (adult) (pediatric): Secondary | ICD-10-CM | POA: Diagnosis present

## 2011-03-24 DIAGNOSIS — Z882 Allergy status to sulfonamides status: Secondary | ICD-10-CM

## 2011-03-24 DIAGNOSIS — Z85841 Personal history of malignant neoplasm of brain: Secondary | ICD-10-CM

## 2011-03-24 DIAGNOSIS — K589 Irritable bowel syndrome without diarrhea: Secondary | ICD-10-CM | POA: Diagnosis present

## 2011-03-24 DIAGNOSIS — I639 Cerebral infarction, unspecified: Secondary | ICD-10-CM | POA: Diagnosis present

## 2011-03-24 DIAGNOSIS — C719 Malignant neoplasm of brain, unspecified: Secondary | ICD-10-CM | POA: Insufficient documentation

## 2011-03-24 HISTORY — DX: Zoster without complications: B02.9

## 2011-03-24 HISTORY — DX: Sleep apnea, unspecified: G47.30

## 2011-03-24 HISTORY — DX: Neoplasm of unspecified behavior of brain: D49.6

## 2011-03-24 HISTORY — DX: Irritable bowel syndrome, unspecified: K58.9

## 2011-03-24 HISTORY — DX: Transient cerebral ischemic attack, unspecified: G45.9

## 2011-03-24 HISTORY — DX: Unspecified convulsions: R56.9

## 2011-03-24 LAB — DIFFERENTIAL
Basophils Absolute: 0 10*3/uL (ref 0.0–0.1)
Lymphocytes Relative: 18 % (ref 12–46)
Lymphs Abs: 1.1 10*3/uL (ref 0.7–4.0)
Neutro Abs: 4.1 10*3/uL (ref 1.7–7.7)
Neutrophils Relative %: 66 % (ref 43–77)

## 2011-03-24 LAB — URINALYSIS, ROUTINE W REFLEX MICROSCOPIC
Bilirubin Urine: NEGATIVE
Glucose, UA: NEGATIVE mg/dL
Ketones, ur: NEGATIVE mg/dL
Leukocytes, UA: NEGATIVE
Nitrite: NEGATIVE
Specific Gravity, Urine: 1.012 (ref 1.005–1.030)
pH: 7 (ref 5.0–8.0)

## 2011-03-24 LAB — CBC
MCV: 92.4 fL (ref 78.0–100.0)
Platelets: 230 10*3/uL (ref 150–400)
RBC: 4.87 MIL/uL (ref 4.22–5.81)
WBC: 6.2 10*3/uL (ref 4.0–10.5)

## 2011-03-24 LAB — COMPREHENSIVE METABOLIC PANEL
ALT: 14 U/L (ref 0–53)
AST: 16 U/L (ref 0–37)
Alkaline Phosphatase: 62 U/L (ref 39–117)
CO2: 29 mEq/L (ref 19–32)
Chloride: 101 mEq/L (ref 96–112)
GFR calc Af Amer: >90 mL/min (ref >90)
GFR calc non Af Amer: 85 mL/min — ABNORMAL LOW (ref >90)
Glucose, Bld: 90 mg/dL (ref 70–99)
Potassium: 4.3 mEq/L (ref 3.5–5.1)
Sodium: 138 mEq/L (ref 135–145)

## 2011-03-24 LAB — URINE CULTURE
Colony Count: 35000
Culture  Setup Time: 201301200244

## 2011-03-24 MED ORDER — COENZYME Q10 30 MG PO CAPS: 200.0000 mg | ORAL_CAPSULE | Freq: Every day | ORAL | Status: DC

## 2011-03-24 MED ORDER — ADULT MULTIVITAMIN W/MINERALS CH
1.0000 | ORAL_TABLET | Freq: Every day | ORAL | Status: DC
  Administered 2011-03-25 – 2011-03-27 (×3): 1 via ORAL
  Filled 2011-03-24 (×3): qty 1

## 2011-03-24 MED ORDER — ONDANSETRON HCL 4 MG/2ML IJ SOLN: 4.0000 mg | Freq: Four times a day (QID) | INTRAMUSCULAR | Status: DC | PRN

## 2011-03-24 MED ORDER — PHENOBARBITAL 32.4 MG PO TABS
97.2000 mg | ORAL_TABLET | Freq: Every day | ORAL | Status: DC
  Administered 2011-03-24: 97.2 mg via ORAL
  Filled 2011-03-24: qty 3

## 2011-03-24 MED ORDER — SODIUM CHLORIDE 0.9 % IV SOLN
INTRAVENOUS | Status: AC
  Administered 2011-03-24 – 2011-03-25 (×2): via INTRAVENOUS

## 2011-03-24 MED ORDER — LEVETIRACETAM 500 MG PO TABS
1000.0000 mg | ORAL_TABLET | Freq: Two times a day (BID) | ORAL | Status: DC
  Administered 2011-03-24 – 2011-03-27 (×6): 1000 mg via ORAL
  Filled 2011-03-24 (×9): qty 2

## 2011-03-24 MED ORDER — ROSUVASTATIN CALCIUM 20 MG PO TABS
20.0000 mg | ORAL_TABLET | Freq: Every day | ORAL | Status: DC
  Administered 2011-03-25 – 2011-03-26 (×2): 20 mg via ORAL
  Filled 2011-03-24 (×4): qty 1

## 2011-03-24 MED ORDER — ASPIRIN EC 81 MG PO TBEC
81.0000 mg | DELAYED_RELEASE_TABLET | Freq: Every day | ORAL | Status: DC
  Administered 2011-03-25 – 2011-03-27 (×3): 81 mg via ORAL
  Filled 2011-03-24 (×5): qty 1

## 2011-03-24 MED ORDER — SENNOSIDES-DOCUSATE SODIUM 8.6-50 MG PO TABS: 1.0000 | ORAL_TABLET | Freq: Every evening | ORAL | Status: DC | PRN

## 2011-03-24 NOTE — H&P (Signed)
Rodney Potts MRN: [id number] DOB/AGE: May 03, 1945 66 y.o. 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 Patient states that one day prior to admission at approximately 11 AM after bringing in some firewood he noted some left hand numbness he also noted some left facial numbness and tingling which has been constant in nature and hasn't improved. Per patient's wife overnight patient almost fell due to left lower extremity weakness to the point that it affected his gait. Patient had to use his cane that he hasn't used in a while. Patient denies any slurred speech, no facial droop, no fever, no chills, no chest pain, no nausea, no vomiting, no abdominal pain, no diarrhea, no constipation, no dysuria. Patient as did have some intermittent nonproductive cough as been ongoing for the past 6 months to one year. Patient does endorse some generalized weakness. Patient said that and due to non-improvement of his symptoms and due to his left lower extremity weakness subsequently presented to the emergency room. In the emergency room CBC which was obtained was within normal limits. He met which was obtained was within normal limits. CT of the head which was done was negative for any acute intracranial abnormality and showed posttreatment changes in the right frontal lobe. MRI of the head was done which showed an acute subcentimeter right thalamic infarction. We were called to admit the patient for further evaluation and management.  Past Medical History    Diagnosis Date  . Brain tumor   . Cancer     PROSTATE CANCER  . TIA (transient ischemic attack)     APPROX N2 MONTHS AGO 01/2011  . Sleep apnea   . IBS (irritable bowel syndrome)   . Shingles   . Cataract   . Seizures     Past Surgical History  Procedure Date  . Eye surgery     CATARACT REMOVAL  . Joint replacement     BILATERAL KNEE REPLACEMENT  . Prostatectomy   . Palate surgery   . Shoulder arthroscopy     Prior to Admission medications   Medication Sig Start Date End Date Taking? Authorizing Provider  aspirin EC 81 MG tablet Take 81 mg by mouth daily.   Yes Historical Provider, MD  atorvastatin (LIPITOR) 40 MG tablet Take 40 mg by mouth daily.   Yes Historical Provider, MD  calcium-vitamin D (OSCAL WITH D) 500-200 MG-UNIT per tablet Take 1 tablet by mouth daily.   Yes Historical Provider, MD  co-enzyme Q-10 30 MG capsule Take 200 mg by mouth daily.   Yes Historical Provider, MD  levETIRAcetam (KEPPRA) 1000 MG tablet Take 1,000 mg by mouth 2 (two) times daily.   Yes Historical Provider, MD  Multiple Vitamin (MULITIVITAMIN WITH MINERALS) TABS Take 1 tablet by mouth daily.   Yes Historical Provider, MD  PHENobarbital (LUMINAL) 97.2 MG tablet Take 97.2 mg by mouth daily.   Yes Historical Provider, MD    Allergies:  Allergies  Allergen Reactions  . Amoxicillin Diarrhea  . Dilaudid (Hydromorphone Hcl) Other (See Comments)    DELUSIONS  . Lactose Intolerance (Gi) Diarrhea  .  Sulfa Antibiotics Other (See Comments)    Cant control bladder    History reviewed. No pertinent family history.  Social History:  reports that he has quit smoking. He does not have any smokeless tobacco history on file. He reports that he drinks about 3 ounces of alcohol per week. His drug history not on file.  ROS: All systems reviewed with the patient and was positive as per HPI otherwise all other systems are negative.  PHYSICAL EXAM: Blood pressure 107/75, pulse 69, temperature 97.3 F  (36.3 C), temperature source Oral, resp. rate 20, SpO2 95.00%. General: Well-developed well-nourished in no acute cardiopulmonary distress.  HEENT: Normocephalic atraumatic. Pupils equal round and reactive to light and accommodation. Extraocular movements intact. Oropharynx is clear, no lesions, no exudates. Neck is supple no lymphadenopathy. No bruits, no goiter. Heart: Regular rate and rhythm, without murmurs, rubs, gallops. Lungs: Clear to auscultation bilaterally. Abdomen: Soft, nontender, nondistended, positive bowel sounds. Extremities: No clubbing cyanosis or edema with positive pedal pulses. Neuro: Alert and oriented x3. Cranial nerves II through XII are grossly intact. Visual fields are intact. 5 out of 5 bilateral upper extremity strength. 5 out of 5 bilateral lower extremity strength. Unable to elicit reflexes symmetrically and are diffusely. Gait not tested secondary to safety. Patient with slight dysmetria with finger to nose on the left upper extremity. Decreased sensation in the left lower face otherwise intact elsewhere .   EKG: Normal sinus rhythm  No results found for this or any previous visit (from the past 240 hour(s)).   Lab results:  [facility] 03/24/11 1259  NA 138  K 4.3  CL 101  CO2 29  GLUCOSE 90  BUN 17  CREATININE 0.97  CALCIUM 9.6  MG --  PHOS --    Basename 03/24/11 1259  AST 16  ALT 14  ALKPHOS 62  BILITOT 0.2*  PROT 7.2  ALBUMIN 4.0   No results found for this basename: LIPASE:2,AMYLASE:2 in the last 72 hours  Basename 03/24/11 1259  WBC 6.2  NEUTROABS 4.1  HGB 15.0  HCT 45.0  MCV 92.4  PLT 230   No results found for this basename: CKTOTAL:3,CKMB:3,CKMBINDEX:3,TROPONINI:3 in the last 72 hours No components found with this basename: POCBNP:3 No results found for this basename: DDIMER in the last 72 hours No results found for this basename: HGBA1C:2 in the last 72 hours No results found for this basename:  CHOL:2,HDL:2,LDLCALC:2,TRIG:2,CHOLHDL:2,LDLDIRECT:2 in the last 72 hours No results found for this basename: TSH,T4TOTAL,FREET3,T3FREE,THYROIDAB in the last 72 hours No results found for this basename: VITAMINB12:2,FOLATE:2,FERRITIN:2,TIBC:2,IRON:2,RETICCTPCT:2 in the last 72 hours Imaging results:  Ct Head Wo Contrast  03/24/2011  *RADIOLOGY REPORT*  Clinical Data: Left-sided weakness, confusion  CT HEAD WITHOUT CONTRAST  Technique:  Contiguous axial images were obtained from the base of the skull through the vertex without contrast.  Comparison: [facility] MRI brain dated 01/22/2011  Findings: No evidence of parenchymal hemorrhage or extra-axial fluid collection. No mass lesion, mass effect, or midline shift.  No CT evidence of acute infarction.  Post-treatment changes in the right frontal lobe.  Old right thalamic lacunar infarct.  Extensive subcortical white matter and periventricular small vessel ischemic changes. Intracranial atherosclerosis.  Mild global cortical atrophy.  Secondary ventricular prominence.  The visualized paranasal sinuses are essentially clear. The mastoid air cells are unopacified.  No evidence of calvarial fracture.  IMPRESSION: No evidence of acute intracranial abnormality.  Post-treatment changes in the right frontal lobe.  Atrophy with extensive small vessel ischemic changes and  intracranial atherosclerosis.  Original Report Authenticated By: Charline Bills, M.D.   Mr Brain Wo Contrast  03/24/2011  *RADIOLOGY REPORT*  Clinical Data: New left hand and jaw numbness.  History of brain tumor.  MRI HEAD WITHOUT CONTRAST  Technique:  Multiplanar, multiecho pulse sequences of the brain and surrounding structures were obtained according to standard protocol without intravenous contrast.  Comparison: 03/24/2011 CT earlier in the day  Findings: There is a 4 x 9 mm area of acute infarction affecting the right lateral thalamus adjacent to an area of old lacunar infarction.  There  is gliosis and encephalomalacia in the right frontotemporal region where reportedly there has been treatment for a brain tumor in 1982.  I do not see changes of craniotomy either on today's study or on the prior CT. There is no visible recurrent disease based on lack of mass effect and no visible vasogenic edema.  There is no acute hemorrhage, mass lesion, acute hydrocephalus, or extra-axial fluid.  Ventricular enlargement is felt to be on an  ex vacuo basis.  Widespread foci of chronic hemorrhage are seen throughout the brain.  These likely represent post treatment effect from radiotherapy.  Extensive atrophy and chronic microvascular ischemic changes are seen in the supratentorial compartment. There is no midline shift.  The intracranial vasculature is widely patent. There is no acute sinus or mastoid disease.  Compared with prior CT, there is no significant change.  Compared with prior MR, the acute infarction was not present at that time.  IMPRESSION: Acute subcentimeter right thalamic infarction likely explains the patient's symptoms.  Atrophy and small vessel disease with multiple small foci of chronic hemorrhage, likely related to post treatment effect from radiation.  Post treatment changes right inferior frontal lobe, reportedly brain tumor many years ago with no visible recurrent disease.  Original Report Authenticated By: Elsie Stain, M.D.   Impression/Plan:  Principal Problem:  *CVA (cerebral infarction) Active Problems:  Seizures   #1 acute subcentimeter right thalamic infarction Patient with prior TIA 2 months ago. On the chronic baby aspirin. Will admit the patient to telemetry. We'll get a stroke workup including carotid Dopplers 2-D echo. We'll check a fasting lipid panel. Will check MRA of the head and neck. EKG does show normal sinus rhythm. PT OT ST. Patient was told per his cardiologist and his neurologist never to go on Plavix do too increase bleeding from his prior glioma. Will  continue patient on his home dose of aspirin. Will consult with neurology for further evaluation and recommendations.?? Is Aggrenox suitable for this candidate. #2 history of seizures Secondary to history of glioma. Continue home dose Keppra and phenobarbital. Seizure precautions. #3 IBS Stable #4 prophylaxis SCDs for DVT prophylaxis.   Guthrie Lemme 03/24/2011, 7:07 PM

## 2011-03-24 NOTE — ED Notes (Signed)
Pt is in mri 

## 2011-03-24 NOTE — ED Notes (Signed)
Pt presenting to ed with c/o left side weakness onset yesterday. Pt states he developed weakness in his left arm and left leg. Pt states he had some difficulty walking in the middle of the night. Pt and wife deny slurred speech at this time. Pt with no facial droop noted. Pt is alert and oriented at this time. Per pt's wife pt was disoriented yesterday. No arm drift noted at this time. Pt with slight weakness noted on the left side.

## 2011-03-24 NOTE — ED Provider Notes (Signed)
Ct Head Wo Contrast  03/24/2011  *RADIOLOGY REPORT*  Clinical Data: Left-sided weakness, confusion  CT HEAD WITHOUT CONTRAST  Technique:  Contiguous axial images were obtained from the base of the skull through the vertex without contrast.  Comparison: [facility] MRI brain dated 01/22/2011  Findings: No evidence of parenchymal hemorrhage or extra-axial fluid collection. No mass lesion, mass effect, or midline shift.  No CT evidence of acute infarction.  Post-treatment changes in the right frontal lobe.  Old right thalamic lacunar infarct.  Extensive subcortical white matter and periventricular small vessel ischemic changes. Intracranial atherosclerosis.  Mild global cortical atrophy.  Secondary ventricular prominence.  The visualized paranasal sinuses are essentially clear. The mastoid air cells are unopacified.  No evidence of calvarial fracture.  IMPRESSION: No evidence of acute intracranial abnormality.  Post-treatment changes in the right frontal lobe.  Atrophy with extensive small vessel ischemic changes and intracranial atherosclerosis.  Original Report Authenticated By: Charline Bills, M.D.   Mr Brain Wo Contrast  03/24/2011  *RADIOLOGY REPORT*  Clinical Data: New left hand and jaw numbness.  History of brain tumor.  MRI HEAD WITHOUT CONTRAST  Technique:  Multiplanar, multiecho pulse sequences of the brain and surrounding structures were obtained according to standard protocol without intravenous contrast.  Comparison: 03/24/2011 CT earlier in the day  Findings: There is a 4 x 9 mm area of acute infarction affecting the right lateral thalamus adjacent to an area of old lacunar infarction.  There is gliosis and encephalomalacia in the right frontotemporal region where reportedly there has been treatment for a brain tumor in 1982.  I do not see changes of craniotomy either on today's study or on the prior CT. There is no visible recurrent disease based on lack of mass effect and no visible  vasogenic edema.  There is no acute hemorrhage, mass lesion, acute hydrocephalus, or extra-axial fluid.  Ventricular enlargement is felt to be on an  ex vacuo basis.  Widespread foci of chronic hemorrhage are seen throughout the brain.  These likely represent post treatment effect from radiotherapy.  Extensive atrophy and chronic microvascular ischemic changes are seen in the supratentorial compartment. There is no midline shift.  The intracranial vasculature is widely patent. There is no acute sinus or mastoid disease.  Compared with prior CT, there is no significant change.  Compared with prior MR, the acute infarction was not present at that time.  IMPRESSION: Acute subcentimeter right thalamic infarction likely explains the patient's symptoms.  Atrophy and small vessel disease with multiple small foci of chronic hemorrhage, likely related to post treatment effect from radiation.  Post treatment changes right inferior frontal lobe, reportedly brain tumor many years ago with no visible recurrent disease.  Original Report Authenticated By: Elsie Stain, M.D.    MRI report reviewed, discussed with Dr. Lyman Speller and he asked that the patient be transferred to [facility] to stroke team with hospitalist to admit.  Discussed with hospitalist and family.    Hilario Quarry, MD 03/24/11 [phone]

## 2011-03-24 NOTE — ED Notes (Signed)
Patient transported to X-ray 

## 2011-03-24 NOTE — ED Provider Notes (Addendum)
History     CSN: [id number]  Arrival date & time 03/24/11  1155   First MD Initiated Contact with Patient 03/24/11 1201      Chief Complaint  Patient presents with  . Weakness    (Consider location/radiation/quality/duration/timing/severity/associated sxs/prior treatment) HPI  Patient relates yesterday he was bringing in firewood and noticed he started getting numbness in his left hand and on his left facial cheek. He relates that has persisted. He relates this morning he got up to go to the bathroom and started having weakness in his left lower leg and it gave out on him with weight bearing and now is using a cane. He relates he's had some weakness in his left leg since had a total knee replacement done but relates it's much worse today and he hasn't had to use his cane in months. He's had normal speech per his wife. He relates he has good grip with his left hand however he's having trouble with fine motor control such as buttoning his clothing. He denies headache, facial drooping, visual change, nausea, vomiting, chest pain, or shortness of breath. They relate he had something similar to this a few months ago when he had inability to talk for about 10 minutes. He had MRI showing he has chronic changes from having had radiation therapy and he was having bleeding from a blood vessel damage from the radiation therapy.   PCP Dr. Benedetto Goad with [facility] Cardiologist Dr. Alanda Amass Neurologist Dr. Terrace Arabia  Past Medical History  Diagnosis Date  . Cancer   . Brain tumor     No past surgical history on file.  No family history on file.  History  Substance Use Topics  . Smoking status: no  . Smokeless tobacco: Not on file  . 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    Allergies  Amoxicillin; Lactose intolerance (gi); and Sulfa antibiotics  Home Medications    Current Outpatient Rx  Name Route Sig Dispense Refill  . ASPIRIN EC 81 MG PO TBEC Oral Take 81 mg by mouth daily.    . ATORVASTATIN CALCIUM 40 MG PO TABS Oral Take 40 mg by mouth daily.    Marland Kitchen CALCIUM CARBONATE-VITAMIN D 500-200 MG-UNIT PO TABS Oral Take 1 tablet by mouth daily.    Marland Kitchen CLOPIDOGREL BISULFATE 75 MG PO TABS Oral Take 75 mg by mouth daily.    Marland Kitchen COENZYME Q10 30 MG PO CAPS Oral Take 200 mg by mouth daily.    Marland Kitchen LEVETIRACETAM 1000 MG PO TABS Oral Take 1,000 mg by mouth 2 (two) times daily.    . ADULT MULTIVITAMIN W/MINERALS CH Oral Take 1 tablet by mouth daily.    Marland Kitchen PHENOBARBITAL 97.2 MG PO TABS Oral Take 97.2 mg by mouth daily.      BP 107/75  Pulse 69  Temp(Src) 97.3 F (36.3 C) (Oral)  Resp 20  SpO2 95% Vital signs normal    Physical Exam  Nursing note and vitals reviewed. Constitutional: He is oriented to person, place, and time. He appears well-developed and well-nourished.  Non-toxic appearance. He does not appear ill. No distress.  HENT:  Head: Normocephalic and atraumatic.  Right Ear: External ear normal.  Left Ear: External ear normal.  Nose: Nose normal. No mucosal edema or rhinorrhea.  Mouth/Throat: Oropharynx is clear and moist and mucous membranes are normal. No dental abscesses or uvula swelling.  Eyes: Conjunctivae and EOM are normal. Pupils are equal, round, and reactive to light.  Neck: Normal range of motion and full passive range of motion without pain. Neck supple.  Cardiovascular: Normal rate, regular rhythm and normal heart sounds.  Exam reveals no gallop and no friction rub.   No murmur heard. Pulmonary/Chest: Effort normal and breath sounds normal. No respiratory distress. He has no wheezes. He has no rhonchi. He has no rales. He exhibits no tenderness and no crepitus.  Abdominal: Soft. Normal appearance and bowel sounds are normal. He exhibits no distension. There is no tenderness. There is no rebound and no guarding.  Musculoskeletal: Normal  range of motion. He exhibits no edema and no tenderness.       Moves all extremities well.   Neurological: He is alert and oriented to person, place, and time. He has normal strength. No cranial nerve deficit.       Patient describes subjective will change in sensation of his left facial cheek. He has no pronator drift. He has intact straight leg raising but it appears to be a little difficult and he has some wavering while he is holding it up. His grip is normal.  Skin: Skin is warm, dry and intact. No rash noted. No erythema. No pallor.  Psychiatric: He has a normal mood and affect. His speech is normal and behavior is normal. His mood appears not anxious.    ED Course  Procedures (including critical care time)  Dr Lyman Speller 14:57 States if MRI is negative to discharge, if postive to call back.   Results for orders placed during the hospital encounter of 03/24/11  CBC      Component Value Range   WBC 6.2  4.0 - 10.5 (K/uL)   RBC 4.87  4.22 - 5.81 (MIL/uL)   Hemoglobin 15.0  13.0 - 17.0 (g/dL)   HCT 56.2  13.0 - 86.5 (%)   MCV 92.4  78.0 - 100.0 (fL)   MCH 30.8  26.0 - 34.0 (pg)   MCHC 33.3  30.0 - 36.0 (g/dL)   RDW 78.4  69.6 - 29.5 (%)   Platelets 230  150 - 400 (K/uL)  DIFFERENTIAL      Component Value Range   Neutrophils Relative 66  43 - 77 (%)   Neutro Abs 4.1  1.7 - 7.7 (K/uL)   Lymphocytes Relative 18  12 - 46 (%)   Lymphs Abs 1.1  0.7 - 4.0 (K/uL)   Monocytes Relative 15 (*) 3 - 12 (%)   Monocytes Absolute 0.9  0.1 - 1.0 (K/uL)   Eosinophils Relative 1  0 - 5 (%)   Eosinophils Absolute 0.1  0.0 - 0.7 (K/uL)   Basophils Relative 0  0 - 1 (%)   Basophils Absolute 0.0  0.0 - 0.1 (K/uL)  COMPREHENSIVE METABOLIC PANEL      Component Value Range   Sodium 138  135 - 145 (mEq/L)   Potassium 4.3  3.5 - 5.1 (mEq/L)   Chloride 101  96 - 112 (mEq/L)   CO2 29  19 - 32 (mEq/L)   Glucose, Bld 90  70 - 99 (mg/dL)   BUN 17  6 - 23 (mg/dL)   Creatinine, Ser 2.84  0.50 - 1.35 (mg/dL)    Calcium 9.6  8.4 - 10.5 (mg/dL)   Total Protein 7.2  6.0 - 8.3 (g/dL)   Albumin 4.0  3.5 - 5.2 (g/dL)   AST 16  0 - 37 (U/L)   ALT  14  0 - 53 (U/L)   Alkaline Phosphatase 62  39 - 117 (U/L)   Total Bilirubin 0.2 (*) 0.3 - 1.2 (mg/dL)   GFR calc non Af Amer 85 (*) >90 (mL/min)   GFR calc Af Amer >90  >90 (mL/min)  APTT      Component Value Range   aPTT 31  24 - 37 (seconds)  PROTIME-INR      Component Value Range   Prothrombin Time 13.4  11.6 - 15.2 (seconds)   INR 1.00  0.00 - 1.49    Laboratory interpretation all normal   Ct Head Wo Contrast  03/24/2011  *RADIOLOGY REPORT*  Clinical Data: Left-sided weakness, confusion  CT HEAD WITHOUT CONTRAST  Technique:  Contiguous axial images were obtained from the base of the skull through the vertex without contrast.  Comparison: [facility] MRI brain dated 01/22/2011  Findings: No evidence of parenchymal hemorrhage or extra-axial fluid collection. No mass lesion, mass effect, or midline shift.  No CT evidence of acute infarction.  Post-treatment changes in the right frontal lobe.  Old right thalamic lacunar infarct.  Extensive subcortical white matter and periventricular small vessel ischemic changes. Intracranial atherosclerosis.  Mild global cortical atrophy.  Secondary ventricular prominence.  The visualized paranasal sinuses are essentially clear. The mastoid air cells are unopacified.  No evidence of calvarial fracture.  IMPRESSION: No evidence of acute intracranial abnormality.  Post-treatment changes in the right frontal lobe.  Atrophy with extensive small vessel ischemic changes and intracranial atherosclerosis.  Original Report Authenticated By: Charline Bills, M.D.     Date: 03/24/2011  Rate: 65  Rhythm: normal sinus rhythm  QRS Axis: normal  Intervals: normal  ST/T Wave abnormalities: normal  Conduction Disutrbances:none  Narrative Interpretation:   Old EKG Reviewed: unchanged from 07/04/2010 (rate was 50)    Diagnoses  that have been ruled out:  None  Diagnoses that are still under consideration:  None  Final diagnoses:  Numbness  Weakness   Plan per Dr Rosalia Hammers after reviewing his MR head results  Devoria Albe, MD, FACEP     MDM    8:18 AM 04/02/2010 Mr Brain Wo Contrast  03/24/2011  *RADIOLOGY REPORT*  Clinical Data: New left hand and jaw numbness.  History of brain tumor.  MRI HEAD WITHOUT CONTRAST  Technique:  Multiplanar, multiecho pulse sequences of the brain and surrounding structures were obtained according to standard protocol without intravenous contrast.  Comparison: 03/24/2011 CT earlier in the day  Findings: There is a 4 x 9 mm area of acute infarction affecting the right lateral thalamus adjacent to an area of old lacunar infarction.  There is gliosis and encephalomalacia in the right frontotemporal region where reportedly there has been treatment for a brain tumor in 1982.  I do not see changes of craniotomy either on today's study or on the prior CT. There is no visible recurrent disease based on lack of mass effect and no visible vasogenic edema.  There is no acute hemorrhage, mass lesion, acute hydrocephalus, or extra-axial fluid.  Ventricular enlargement is felt to be on an  ex vacuo basis.  Widespread foci of chronic hemorrhage are seen throughout the brain.  These likely represent post treatment effect from radiotherapy.  Extensive atrophy and chronic microvascular ischemic changes are seen in the supratentorial compartment. There is no midline shift.  The intracranial vasculature is widely patent. There is no acute sinus or mastoid disease.  Compared with prior CT, there is no significant change.  Compared with  prior MR, the acute infarction was not present at that time.  IMPRESSION: Acute subcentimeter right thalamic infarction likely explains the patient's symptoms.  Atrophy and small vessel disease with multiple small foci of chronic hemorrhage, likely related to post treatment effect from  radiation.  Post treatment changes right inferior frontal lobe, reportedly brain tumor many years ago with no visible recurrent disease.  Original Report Authenticated By: Elsie Stain, M.D.    Additional diagnosis acute stroke  Plan admission     Ward Givens, MD 03/24/11 1458  Ward Givens, MD 03/25/11 [phone]

## 2011-03-24 NOTE — Progress Notes (Signed)
PHARMACIST - PHYSICIAN ORDER COMMUNICATION  CONCERNING: P&T Medication Policy on Herbal Medications  DESCRIPTION:  This patient's order for:  Co-enzyme Q10 has been noted.  This product(s) is classified as an "herbal" or natural product. Due to a lack of definitive safety studies or FDA approval, nonstandard manufacturing practices, plus the potential risk of unknown drug-drug interactions while on inpatient medications, the Pharmacy and Therapeutics Committee does not permit the use of "herbal" or natural products of this type within [place].   ACTION TAKEN: The pharmacy department is unable to verify this order at this time and your patient has been informed of this safety policy. Please reevaluate patient's clinical condition at discharge and address if the herbal or natural product(s) should be resumed at that time.   

## 2011-03-25 MED ORDER — PHENOBARBITAL 32.4 MG PO TABS
194.4000 mg | ORAL_TABLET | Freq: Every day | ORAL | Status: DC
  Administered 2011-03-25 – 2011-03-26 (×2): 194.4 mg via ORAL
  Filled 2011-03-25 (×2): qty 6

## 2011-03-25 MED ORDER — ACETAMINOPHEN 325 MG PO TABS
650.0000 mg | ORAL_TABLET | Freq: Four times a day (QID) | ORAL | Status: DC | PRN
  Administered 2011-03-25 – 2011-03-26 (×3): 650 mg via ORAL
  Filled 2011-03-25 (×3): qty 2

## 2011-03-25 NOTE — Progress Notes (Signed)
Physical Therapy Evaluation Patient Details Name: Rodney Potts MRN: [id number] DOB: Apr 27, 1945 Today's Date: 03/25/2011  Problem List:  Patient Active Problem List  Diagnoses  . CVA (cerebral infarction)  . Seizures  . OSA (obstructive sleep apnea)  . IBS (irritable bowel syndrome)  . Glioma of brain    Past Medical History:  Past Medical History  Diagnosis Date  . Brain tumor   . Cancer     PROSTATE CANCER  . TIA (transient ischemic attack)     APPROX N2 MONTHS AGO 01/2011  . Sleep apnea   . IBS (irritable bowel syndrome)   . Shingles   . Cataract   . Seizures    Past Surgical History:  Past Surgical History  Procedure Date  . Eye surgery     CATARACT REMOVAL  . Joint replacement     BILATERAL KNEE REPLACEMENT  . Prostatectomy   . Palate surgery   . Shoulder arthroscopy     PT Assessment/Plan/Recommendation PT Assessment Clinical Impression Statement: 66 y.o. male with history of right-sided glioma with radiation treatments who developed stroke like symptoms while carrying fire-wood. Patient found to have an acute right thalamic stroke. Pt presents today with Lt. sided weakness and decreased motor control of Lt. side. Pt has h/o Lt. knee surgery and has had difficulty getting knee extension with ambulation however this was worsened by the stroke. Attempted gait with 1 hand hold assist, cane, and RW. Pt only safe right now with RW. Recommend outpatient PT per pt preference for improved gait mechanics, safety, and to establish a community exercise program.  PT Recommendation/Assessment: Patient will need skilled PT in the acute care venue PT Problem List: Decreased strength;Decreased activity tolerance;Decreased range of motion;Decreased balance;Decreased mobility;Decreased coordination;Decreased knowledge of use of DME Barriers to Discharge: None PT Therapy Diagnosis : Difficulty walking;Abnormality of gait;Generalized weakness PT Plan PT Frequency: Min 4X/week PT  Treatment/Interventions: DME instruction;Gait training;Stair training;Functional mobility training;Therapeutic activities;Therapeutic exercise;Balance training;Neuromuscular re-education;Patient/family education PT Recommendation Recommendations for Other Services: OT consult Follow Up Recommendations: Outpatient PT;Supervision for mobility/OOB Equipment Recommended: Rolling walker with 5" wheels PT Goals  Acute Rehab PT Goals PT Goal Formulation: With patient Time For Goal Achievement: 2 weeks Pt will go Sit to Stand: with modified independence PT Goal: Sit to Stand - Progress: Not met Pt will go Stand to Sit: with modified independence PT Goal: Stand to Sit - Progress: Not met Pt will Ambulate: >150 feet;with modified independence;with least restrictive assistive device PT Goal: Ambulate - Progress: Not met Pt will Go Up / Down Stairs: 3-5 stairs;with modified independence;with least restrictive assistive device PT Goal: Up/Down Stairs - Progress: Not met Additional Goals Additional Goal #1: Verbalize signs/symptoms of stroke, appropriate response to symptoms.  PT Goal: Additional Goal #1 - Progress: Not met  PT Evaluation Precautions/Restrictions  Precautions Precautions: Fall Precaution Comments: Needs supervision with gait, needs RW as well.  Prior Functioning  Home Living Lives With: Spouse Type of Home: House Home Layout: Two level;Able to live on main level with bedroom/bathroom Alternate Level Stairs-Rails: Left Alternate Level Stairs-Number of Steps: 12 Home Access: Stairs to enter Entrance Stairs-Rails: Right Entrance Stairs-Number of Steps: 3 Bathroom Shower/Tub: Walk-in shower;Tub/shower unit Bathroom Toilet: Standard Home Adaptive Equipment: Straight cane Prior Function Level of Independence: Independent with basic ADLs Driving: Yes Vocation: Retired Financial risk analyst Arousal/Alertness: Awake/alert Overall Cognitive Status: Appears within functional  limits for tasks assessed Orientation Level: Oriented X4 Cognition - Other Comments: minimal decreased processing.  Sensation/Coordination Sensation Light Touch: Appears  Intact Coordination Gross Motor Movements are Fluid and Coordinated: Yes Finger Nose Finger Test: WNL but slow, pt notices difference.  Extremity Assessment RLE Assessment RLE Assessment: Within Functional Limits LLE Assessment LLE Assessment: Exceptions to WFL LLE AROM (degrees) Overall AROM Left Lower Extremity: Deficits LLE Overall AROM Comments: Decreased knee extension (limited by ~20 degrees)  LLE Strength LLE Overall Strength: Deficits LLE Overall Strength Comments: Generally weaker than Rt. LE, grossly >/= 4/5 Mobility (including Balance) Bed Mobility Bed Mobility: Yes Supine to Sit: 7: Independent Sit to Supine: 7: Independent Transfers Transfers: Yes Sit to Stand: 5: Supervision;4: Min assist;From bed;From chair/3-in-1;With upper extremity assist Sit to Stand Details (indicate cue type and reason): min assist without assistive device in standing, supervision with.  Stand to Sit: 5: Supervision;To chair/3-in-1;To bed Ambulation/Gait Ambulation/Gait: Yes Ambulation/Gait Assistance: 4: Min assist;3: Mod assist;5: Supervision Ambulation/Gait Assistance Details (indicate cue type and reason): Up to moderate assist with ambulation with 1 hand hold assist and with cane, particularly with turning Lt. secondary loss of balance. With RW pt at supervisional level. Pt with decreased ability to bear weight on Lt. LE, knee flexed through stance. Decreased foot clearance of Lt. foot. (Present pta however worsened secondary to recent stroke.) Verbal cues to increase knee extension with heel strike/throughout stance and increase heel strike/dorsiflexion.  Ambulation Distance (Feet): 100 Feet (1 seated rest at 50' ) Assistive device: Rolling walker;1 person hand held assist;Straight cane Gait Pattern: Step-to  pattern;Step-through pattern;Trunk flexed;Decreased stance time - left;Decreased weight shift to left;Decreased dorsiflexion - left;Left foot flat Stairs: Yes Stairs Assistance: 5: Supervision Stairs Assistance Details (indicate cue type and reason): Verbal cues for safe sequencing of steps. Supervision needed for safety.  Stair Management Technique: One rail Left;Step to pattern;Forwards Number of Stairs: 4   Posture/Postural Control Posture/Postural Control: No significant limitations Balance Balance Assessed: Yes Static Sitting Balance Static Sitting - Balance Support: No upper extremity supported;Feet supported Static Sitting - Level of Assistance: 7: Independent Static Standing Balance Static Standing - Balance Support: No upper extremity supported Static Standing - Level of Assistance: 4: Min assist Static Standing - Comment/# of Minutes: Min assist needed with eyes closed normal base, narrow base, and with looking over shoulder. Pt unable to perform single limb stance bilaterally.  End of Session PT - End of Session Equipment Utilized During Treatment: Gait belt Activity Tolerance: Patient tolerated treatment well;Patient limited by fatigue Patient left: in bed;with call bell in reach;with family/visitor present Nurse Communication: Mobility status for ambulation General Behavior During Session: [facility] for tasks performed Cognition: [facility] for tasks performed  Sherie Don 03/25/2011, 12:07 PM   Sherie Don) Carleene Mains PT, DPT Acute Rehabilitation [phone]

## 2011-03-25 NOTE — Progress Notes (Signed)
Transport came up to transport pt for MRA.  Pt refused.  States he already had test done 4-6 weeks ago. Dr. Eather Colas. Dr going to get copy of results tomorrow. A. Ethon Wymer, RN

## 2011-03-25 NOTE — Progress Notes (Signed)
Subjective: No acute complaints. Feels well. States he has some chronic balance issues due to a left knee replacement.  Objective: Vital signs in last 24 hours: Temp:  [97.2 F (36.2 C)-97.8 F (36.6 C)] 97.6 F (36.4 C) (01/20 0645) Pulse Rate:  [50-69] 50  (01/20 0645) Resp:  [16-20] 18  (01/20 0645) BP: (106-156)/(62-77) 114/73 mmHg (01/20 0645) SpO2:  [91 %-97 %] 93 % (01/20 0645) Weight:  [77.4 kg (170 lb 10.2 oz)] 77.4 kg (170 lb 10.2 oz) (01/19 2248) Weight change:     Intake/Output from previous day: 01/19 0701 - 01/20 0700 In: -  Out: 600 [Urine:600]     Physical Exam: General: Alert, awake, oriented x3, in no acute distress. HEENT: No bruits, no goiter. Heart: Regular rate and rhythm, without murmurs, rubs, gallops. Lungs: Clear to auscultation bilaterally. Abdomen: Soft, nontender, nondistended, positive bowel sounds. Extremities: No clubbing cyanosis or edema with positive pedal pulses. Neuro: Grossly intact, nonfocal.    Lab Results: Basic Metabolic Panel:  Basename 03/24/11 1259  NA 138  K 4.3  CL 101  CO2 29  GLUCOSE 90  BUN 17  CREATININE 0.97  CALCIUM 9.6  MG --  PHOS --   Liver Function Tests:  Basename 03/24/11 1259  AST 16  ALT 14  ALKPHOS 62  BILITOT 0.2*  PROT 7.2  ALBUMIN 4.0   CBC:  Basename 03/24/11 1259  WBC 6.2  NEUTROABS 4.1  HGB 15.0  HCT 45.0  MCV 92.4  PLT 230    Coagulation:  Basename 03/24/11 1259  LABPROT 13.4  INR 1.00    Studies/Results: Dg Chest 2 View  03/24/2011  *RADIOLOGY REPORT*  Clinical Data: Stroke.  CHEST - 2 VIEW  Comparison: 07/04/2010  Findings: Heart and mediastinal contours are within normal limits. No focal opacities or effusions.  No acute bony abnormality.  IMPRESSION: No active cardiopulmonary disease.  Original Report Authenticated By: Cyndie Chime, M.D.   Ct Head Wo Contrast  03/24/2011  *RADIOLOGY REPORT*  Clinical Data: Left-sided weakness, confusion  CT HEAD WITHOUT  CONTRAST  Technique:  Contiguous axial images were obtained from the base of the skull through the vertex without contrast.  Comparison: [facility] MRI brain dated 01/22/2011  Findings: No evidence of parenchymal hemorrhage or extra-axial fluid collection. No mass lesion, mass effect, or midline shift.  No CT evidence of acute infarction.  Post-treatment changes in the right frontal lobe.  Old right thalamic lacunar infarct.  Extensive subcortical white matter and periventricular small vessel ischemic changes. Intracranial atherosclerosis.  Mild global cortical atrophy.  Secondary ventricular prominence.  The visualized paranasal sinuses are essentially clear. The mastoid air cells are unopacified.  No evidence of calvarial fracture.  IMPRESSION: No evidence of acute intracranial abnormality.  Post-treatment changes in the right frontal lobe.  Atrophy with extensive small vessel ischemic changes and intracranial atherosclerosis.  Original Report Authenticated By: Charline Bills, M.D.   Mr Brain Wo Contrast  03/24/2011  *RADIOLOGY REPORT*  Clinical Data: New left hand and jaw numbness.  History of brain tumor.  MRI HEAD WITHOUT CONTRAST  Technique:  Multiplanar, multiecho pulse sequences of the brain and surrounding structures were obtained according to standard protocol without intravenous contrast.  Comparison: 03/24/2011 CT earlier in the day  Findings: There is a 4 x 9 mm area of acute infarction affecting the right lateral thalamus adjacent to an area of old lacunar infarction.  There is gliosis and encephalomalacia in the right frontotemporal region where reportedly there has  been treatment for a brain tumor in 1982.  I do not see changes of craniotomy either on today's study or on the prior CT. There is no visible recurrent disease based on lack of mass effect and no visible vasogenic edema.  There is no acute hemorrhage, mass lesion, acute hydrocephalus, or extra-axial fluid.  Ventricular  enlargement is felt to be on an  ex vacuo basis.  Widespread foci of chronic hemorrhage are seen throughout the brain.  These likely represent post treatment effect from radiotherapy.  Extensive atrophy and chronic microvascular ischemic changes are seen in the supratentorial compartment. There is no midline shift.  The intracranial vasculature is widely patent. There is no acute sinus or mastoid disease.  Compared with prior CT, there is no significant change.  Compared with prior MR, the acute infarction was not present at that time.  IMPRESSION: Acute subcentimeter right thalamic infarction likely explains the patient's symptoms.  Atrophy and small vessel disease with multiple small foci of chronic hemorrhage, likely related to post treatment effect from radiation.  Post treatment changes right inferior frontal lobe, reportedly brain tumor many years ago with no visible recurrent disease.  Original Report Authenticated By: Elsie Stain, M.D.    Medications: Scheduled Meds:   . aspirin EC  81 mg Oral Daily  . levETIRAcetam  1,000 mg Oral BID  . mulitivitamin with minerals  1 tablet Oral Daily  . PHENobarbital  194.4 mg Oral Daily  . rosuvastatin  20 mg Oral q1800  . DISCONTD: co-enzyme Q-10  210 mg Oral Daily  . DISCONTD: PHENobarbital  97.2 mg Oral Daily   Continuous Infusions:   . sodium chloride 75 mL/hr at 03/24/11 2028   PRN Meds:.acetaminophen, ondansetron (ZOFRAN) IV, senna-docusate  Assessment/Plan:  Principal Problem:  *CVA (cerebral infarction) Active Problems:  Seizures  #1 small right thalamic CVA: Only risk factor for CVA is hyperlipidemia. He is already on a statin as well as aspirin. He has been seen in consultation by neurology. No further recommendations. He recently had a 2-D echo and a carotid Doppler performed about 10 days ago at his cardiologist's office that per his report is normal, we'll see no need to repeat these tests at this time. Will await physical and  occupational therapy evaluation.   LOS: 1 day   [facility] Pager: [phone] 03/25/2011, 9:30 AM

## 2011-03-25 NOTE — Progress Notes (Signed)
*  PRELIMINARY RESULTS*  Patient stated that he had an Echo and Carotid Dopplers last week at [facility]. Doctor said OK to disregard Echo and Carotid orders.     Sherren Kerns Renee 03/25/2011, 9:13 AM

## 2011-03-25 NOTE — Progress Notes (Signed)
[facility] progress note. Chief complaint. Left upper extremity and left facial numbness. History of present illness. 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 He presented to [facility] long emergency room with complaint as above. CT scan of the head was negative but an MRI of the head did show an acute sub-centimeter right thalamic infarction. The patient was transferred to [facility] and I am seeing him to ensure he remained stable and ensure his orders transferred appropriately. Vital signs. Temperature 97.6, pulse 61, respiration 18, blood pressure 110/62. O2 sats 91%. General appearance well-developed male. He is alert and oriented. Cardiac. Rate and rhythm regular. No jugular venous distention. Negative Homans. Lungs. Clear and equal bilaterally. No distress or cough. Abdomen. Soft with positive bowel sounds. No pain or guarding. Neurologic. Cranial nerves 2-12 grossly intact. Patient alert and oriented x3. No unilateral or focal defects. He states the numbness left arm and face appears improved but not resolved. Impression/plan Problem #1 acute right polemic infarction. Patient for MRA, carotid Dopplers, and 2-D echocardiogram. Aspirin continued. Neurology consult. Problem #2 seizure disorder. Patient on Keppra and phenobarbital. He reports that his phenobarbital dosing at bedtime is not 97.2 mg but instead 194.4 mg. I have made this change per patient's request. I have added a phenobarbital level with the a.m. blood draw. All orders are reviewed and appear appropriate.

## 2011-03-25 NOTE — Consult Note (Signed)
Chief Complaint: "left facial droop, left face and arm numbness and tingling  HPI: Rodney Potts is an 66 y.o. male with history of right-sided glioma with radiation treatments to this area who 3 days ago developed above-mentioned symptoms while carrying fire-wood. Patient went to [facility] where he was found to have an acute right thalamic stroke. He was transferred to [facility] for work-up. Patient had echo and CD last week and was started on ASA and Lipitor 2 weeks ago.   LSN: 3 days ago  tPA Given: No: out of window mRankin: 0  Past Medical History  Diagnosis Date  . Brain tumor   . Cancer     PROSTATE CANCER  . TIA (transient ischemic attack)     APPROX N2 MONTHS AGO 01/2011  . Sleep apnea   . IBS (irritable bowel syndrome)   . Shingles   . Cataract   . Seizures    Past Surgical History  Procedure Date  . Eye surgery     CATARACT REMOVAL  . Joint replacement     BILATERAL KNEE REPLACEMENT  . Prostatectomy   . Palate surgery   . Shoulder arthroscopy    History reviewed. No pertinent family history. Social History:  reports that he has quit smoking. He does not have any smokeless tobacco history on file. He reports that he drinks about 3 ounces of alcohol per week. His drug history not on file.  Allergies:  Allergies  Allergen Reactions  . Amoxicillin Diarrhea  . Dilaudid (Hydromorphone Hcl) Other (See Comments)    DELUSIONS  . Lactose Intolerance (Gi) Diarrhea  . Sulfa Antibiotics Other (See Comments)    Cant control bladder   Medications: I have reviewed the patient's current medications.  ROS: As above  Physical Examination: Blood pressure 114/73, pulse 50, temperature 97.6 F (36.4 C), temperature source Oral, resp. rate 18, height 5' 9.5" (1.765 m), weight 77.4 kg (170 lb 10.2 oz), SpO2 93.00%.  Neurologic Examination: MS: AAO*3, no aphasia, follows complex commands CN: EOMI, PERRL, VFF, left facial droop, tongue midline, V1-V3 intact and symmetric  bilaterally Motor: no drift, 5/5 strength throughout Sensory: decreased sensation to pain in LLE Coord: F to N intact b/l Reflexes: 1+ in UE, 1+ KJ, trace AJ, mute plantars b/l Gait: deferred  Results for orders placed during the hospital encounter of 03/24/11 (from the past 48 hour(s))  CBC     Status: Normal   Collection Time   03/24/11 12:59 PM      Component Value Range Comment   WBC 6.2  4.0 - 10.5 (K/uL)    RBC 4.87  4.22 - 5.81 (MIL/uL)    Hemoglobin 15.0  13.0 - 17.0 (g/dL)    HCT 09.8  11.9 - 14.7 (%)    MCV 92.4  78.0 - 100.0 (fL)    MCH 30.8  26.0 - 34.0 (pg)    MCHC 33.3  30.0 - 36.0 (g/dL)    RDW 82.9  56.2 - 13.0 (%)    Platelets 230  150 - 400 (K/uL)   DIFFERENTIAL     Status: Abnormal   Collection Time   03/24/11 12:59 PM      Component Value Range Comment   Neutrophils Relative 66  43 - 77 (%)    Neutro Abs 4.1  1.7 - 7.7 (K/uL)    Lymphocytes Relative 18  12 - 46 (%)    Lymphs Abs 1.1  0.7 - 4.0 (K/uL)    Monocytes Relative 15 (*) 3 -  12 (%)    Monocytes Absolute 0.9  0.1 - 1.0 (K/uL)    Eosinophils Relative 1  0 - 5 (%)    Eosinophils Absolute 0.1  0.0 - 0.7 (K/uL)    Basophils Relative 0  0 - 1 (%)    Basophils Absolute 0.0  0.0 - 0.1 (K/uL)   COMPREHENSIVE METABOLIC PANEL     Status: Abnormal   Collection Time   03/24/11 12:59 PM      Component Value Range Comment   Sodium 138  135 - 145 (mEq/L)    Potassium 4.3  3.5 - 5.1 (mEq/L)    Chloride 101  96 - 112 (mEq/L)    CO2 29  19 - 32 (mEq/L)    Glucose, Bld 90  70 - 99 (mg/dL)    BUN 17  6 - 23 (mg/dL)    Creatinine, Ser 9.81  0.50 - 1.35 (mg/dL)    Calcium 9.6  8.4 - 10.5 (mg/dL)    Total Protein 7.2  6.0 - 8.3 (g/dL)    Albumin 4.0  3.5 - 5.2 (g/dL)    AST 16  0 - 37 (U/L)    ALT 14  0 - 53 (U/L)    Alkaline Phosphatase 62  39 - 117 (U/L)    Total Bilirubin 0.2 (*) 0.3 - 1.2 (mg/dL)    GFR calc non Af Amer 85 (*) >90 (mL/min)    GFR calc Af Amer >90  >90 (mL/min)   APTT     Status: Normal    Collection Time   03/24/11 12:59 PM      Component Value Range Comment   aPTT 31  24 - 37 (seconds)   PROTIME-INR     Status: Normal   Collection Time   03/24/11 12:59 PM      Component Value Range Comment   Prothrombin Time 13.4  11.6 - 15.2 (seconds)    INR 1.00  0.00 - 1.49    URINALYSIS, ROUTINE W REFLEX MICROSCOPIC     Status: Normal   Collection Time   03/24/11  8:55 PM      Component Value Range Comment   Color, Urine YELLOW  YELLOW     APPearance CLEAR  CLEAR     Specific Gravity, Urine 1.012  1.005 - 1.030     pH 7.0  5.0 - 8.0     Glucose, UA NEGATIVE  NEGATIVE (mg/dL)    Hgb urine dipstick NEGATIVE  NEGATIVE     Bilirubin Urine NEGATIVE  NEGATIVE     Ketones, ur NEGATIVE  NEGATIVE (mg/dL)    Protein, ur NEGATIVE  NEGATIVE (mg/dL)    Urobilinogen, UA 0.2  0.0 - 1.0 (mg/dL)    Nitrite NEGATIVE  NEGATIVE     Leukocytes, UA NEGATIVE  NEGATIVE  MICROSCOPIC NOT DONE ON URINES WITH NEGATIVE PROTEIN, BLOOD, LEUKOCYTES, NITRITE, OR GLUCOSE <1000 mg/dL.  PHENOBARBITAL LEVEL     Status: Normal   Collection Time   03/25/11  6:15 AM      Component Value Range Comment   Phenobarbital 28.1  15.0 - 40.0 (ug/mL)    Dg Chest 2 View  03/24/2011  *RADIOLOGY REPORT*  Clinical Data: Stroke.  CHEST - 2 VIEW  Comparison: 07/04/2010  Findings: Heart and mediastinal contours are within normal limits. No focal opacities or effusions.  No acute bony abnormality.  IMPRESSION: No active cardiopulmonary disease.  Original Report Authenticated By: Cyndie Chime, M.D.   Ct Head Wo Contrast  03/24/2011  *  RADIOLOGY REPORT*  Clinical Data: Left-sided weakness, confusion  CT HEAD WITHOUT CONTRAST  Technique:  Contiguous axial images were obtained from the base of the skull through the vertex without contrast.  Comparison: [facility] MRI brain dated 01/22/2011  Findings: No evidence of parenchymal hemorrhage or extra-axial fluid collection. No mass lesion, mass effect, or midline shift.  No CT evidence  of acute infarction.  Post-treatment changes in the right frontal lobe.  Old right thalamic lacunar infarct.  Extensive subcortical white matter and periventricular small vessel ischemic changes. Intracranial atherosclerosis.  Mild global cortical atrophy.  Secondary ventricular prominence.  The visualized paranasal sinuses are essentially clear. The mastoid air cells are unopacified.  No evidence of calvarial fracture.  IMPRESSION: No evidence of acute intracranial abnormality.  Post-treatment changes in the right frontal lobe.  Atrophy with extensive small vessel ischemic changes and intracranial atherosclerosis.  Original Report Authenticated By: Charline Bills, M.D.   Mr Brain Wo Contrast  03/24/2011  *RADIOLOGY REPORT*  Clinical Data: New left hand and jaw numbness.  History of brain tumor.  MRI HEAD WITHOUT CONTRAST  Technique:  Multiplanar, multiecho pulse sequences of the brain and surrounding structures were obtained according to standard protocol without intravenous contrast.  Comparison: 03/24/2011 CT earlier in the day  Findings: There is a 4 x 9 mm area of acute infarction affecting the right lateral thalamus adjacent to an area of old lacunar infarction.  There is gliosis and encephalomalacia in the right frontotemporal region where reportedly there has been treatment for a brain tumor in 1982.  I do not see changes of craniotomy either on today's study or on the prior CT. There is no visible recurrent disease based on lack of mass effect and no visible vasogenic edema.  There is no acute hemorrhage, mass lesion, acute hydrocephalus, or extra-axial fluid.  Ventricular enlargement is felt to be on an  ex vacuo basis.  Widespread foci of chronic hemorrhage are seen throughout the brain.  These likely represent post treatment effect from radiotherapy.  Extensive atrophy and chronic microvascular ischemic changes are seen in the supratentorial compartment. There is no midline shift.  The intracranial  vasculature is widely patent. There is no acute sinus or mastoid disease.  Compared with prior CT, there is no significant change.  Compared with prior MR, the acute infarction was not present at that time.  IMPRESSION: Acute subcentimeter right thalamic infarction likely explains the patient's symptoms.  Atrophy and small vessel disease with multiple small foci of chronic hemorrhage, likely related to post treatment effect from radiation.  Post treatment changes right inferior frontal lobe, reportedly brain tumor many years ago with no visible recurrent disease.  Original Report Authenticated By: Elsie Stain, M.D.   Assessment: 66 y.o. male with new right thalamic CVA  Stroke Risk Factors - hyperlipidemia  Plan: 1. HgbA1c, fasting lipid panel 2. PT consult, OT consult, Speech consult 3. Prophylactic therapy-Antiplatelet med: Aspirin - dose 81 mg PO daily 4. Risk factor modification 5. Please get in touch with Dr. Alanda Amass regarding patient's latest work-up results (echo, CD, MRA)  Kayley Zeiders 03/25/2011, 9:07 AM

## 2011-03-26 ENCOUNTER — Inpatient Hospital Stay (HOSPITAL_COMMUNITY): Payer: BC Managed Care – PPO

## 2011-03-26 MED ORDER — ASPIRIN-DIPYRIDAMOLE ER 25-200 MG PO CP12
1.0000 | ORAL_CAPSULE | Freq: Every day | ORAL | Status: DC
  Administered 2011-03-26: 1 via ORAL
  Filled 2011-03-26 (×2): qty 1

## 2011-03-26 MED ORDER — IOHEXOL 350 MG/ML SOLN
50.0000 mL | Freq: Once | INTRAVENOUS | Status: AC | PRN
  Administered 2011-03-26: 50 mL via INTRAVENOUS

## 2011-03-26 MED ORDER — ASPIRIN-DIPYRIDAMOLE ER 25-200 MG PO CP12: 1.0000 | ORAL_CAPSULE | Freq: Two times a day (BID) | ORAL | Status: DC

## 2011-03-26 NOTE — Progress Notes (Signed)
Speech Language Pathology  Spoke with both the pt and his wife, report he has baseline memory deficits but they have not noticed any cognitive changes since recent CVA. Pt passed stroke screen and family reports pt is tolerating a regular diet with thin liquids without overt s/s of aspiration. Defer BSE per protocol. Pt does not need skilled SLP services at this time.   Feliberto Gottron, MA,CCC-SLP [phone]

## 2011-03-26 NOTE — Progress Notes (Signed)
VASCULAR LAB  TCD completed.  Terance Hart RVT 03/26/2011 2:36 PM

## 2011-03-26 NOTE — Progress Notes (Signed)
Stroke Team Progress Note  HISTORY Rodney Potts is a 66 year old Caucasian gentleman with prior history of right frontal glioma status post radiation and chemotherapy, history of obstructive sleep apnea, history of recent TIA per patient in approximately 2 months ago who is on daily baby aspirin, history of seizures who presents to the ED with a one to two-day history of left hand numbness and left facial numbness. Patient states that one day prior to admission at approximately 11 AM after bringing in some firewood he noted some left hand numbness he also noted some left facial numbness and tingling which has been constant in nature and hasn't improved. Per patient's wife overnight patient almost fell due to left lower extremity weakness to the point that it affected his gait. Patient had to use his cane that he hasn't used in a while. Patient denies any slurred speech, no facial droop, no fever, no chills, no chest pain, no nausea, no vomiting, no abdominal pain, no diarrhea, no constipation, no dysuria. Patient as did have some intermittent nonproductive cough as been ongoing for the past 6 months to one year. Patient does endorse some generalized weakness. Patient said that and due to non-improvement of his symptoms and due to his left lower extremity weakness subsequently presented to the emergency room. In the emergency room CBC which was obtained was within normal limits. He met which was obtained was within normal limits. CT of the head which was done was negative for any acute intracranial abnormality and showed posttreatment changes in the right frontal lobe. MRI of the head was done which showed an acute subcentimeter right thalamic infarction. We were called to admit the patient for further evaluation and management.  SUBJECTIVE His wife is at the bedside. Overall he feels his condition is stable. He had a stroke 6 weeks ago - was seen by Dr. Alanda Amass and Dr. Anne Hahn as an OP with complete  workup.  OBJECTIVE Most recent Vital Signs: Temp: 97.4 F (36.3 C) (01/21 0941) Temp src: Oral (01/21 0941) BP: 110/70 mmHg (01/21 0941) Pulse Rate: 57  (01/21 0941) Respiratory Rate: 18 O2 Saturation: 95%  CBG (last 3) No results found for this basename: GLUCAP:3 in the last 72 hours Intake/Output from previous day: 01/20 0701 - 01/21 0700 In: 630 [P.O.:630] Out: 950 [Urine:950]  IV Fluid Intake:     . sodium chloride 75 mL/hr at 03/25/11 1153   Medications    . aspirin EC  81 mg Oral Daily  . levETIRAcetam  1,000 mg Oral BID  . mulitivitamin with minerals  1 tablet Oral Daily  . PHENobarbital  194.4 mg Oral Daily  . rosuvastatin  20 mg Oral q1800  PRN acetaminophen, ondansetron (ZOFRAN) IV, senna-docusate  Diet:  Cardiac thin liquids Activity:   Up with assistance DVT Prophylaxis:  SCDs   Studies: CBC    Component Value Date/Time   WBC 6.2 03/24/2011 1259   RBC 4.87 03/24/2011 1259   HGB 15.0 03/24/2011 1259   HCT 45.0 03/24/2011 1259   PLT 230 03/24/2011 1259   MCV 92.4 03/24/2011 1259   MCH 30.8 03/24/2011 1259   MCHC 33.3 03/24/2011 1259   RDW 14.2 03/24/2011 1259   LYMPHSABS 1.1 03/24/2011 1259   MONOABS 0.9 03/24/2011 1259   EOSABS 0.1 03/24/2011 1259   BASOSABS 0.0 03/24/2011 1259   CMP    Component Value Date/Time   NA 138 03/24/2011 1259   K 4.3 03/24/2011 1259   CL 101 03/24/2011 1259   CO2  29 03/24/2011 1259   GLUCOSE 90 03/24/2011 1259   BUN 17 03/24/2011 1259   CREATININE 0.97 03/24/2011 1259   CALCIUM 9.6 03/24/2011 1259   PROT 7.2 03/24/2011 1259   ALBUMIN 4.0 03/24/2011 1259   AST 16 03/24/2011 1259   ALT 14 03/24/2011 1259   ALKPHOS 62 03/24/2011 1259   BILITOT 0.2* 03/24/2011 1259   GFRNONAA 85* 03/24/2011 1259   GFRAA >90 03/24/2011 1259   COAGS Lab Results  Component Value Date   INR 1.00 03/24/2011   INR 1.04 10/23/2010   INR 2.01* 03/12/2009   Lipid Panel No results found for this basename: chol, trig, hdl, cholhdl, vldl, ldlcalc   HgbA1C  No  results found for this basename: HGBA1C   Urine Drug Screen  No results found for this basename: labopia, cocainscrnur, labbenz, amphetmu, thcu, labbarb    Alcohol Level No results found for this basename: eth     No results found for this or any previous visit (from the past 24 hour(s)).  Dg Chest 2 View  CT of the brain   No evidence of acute intracranial abnormality.  Post-treatment changes in the right frontal lobe.  Atrophy with extensive small vessel ischemic changes and intracranial atherosclerosis.   CT angio head and neck ordered   MRI of the brain  Acute subcentimeter right thalamic infarction likely explains the patient's symptoms.  Atrophy and small vessel disease with multiple small foci of chronic hemorrhage, likely related to post treatment effect from radiation.  Post treatment changes right inferior frontal lobe, reportedly brain tumor many years ago with no visible recurrent disease.   MRA of the brain  Done 6 wks ago as on outpatient through [facility]  2D Echocardiogram  Done 6 weeks ago at Dr. Kandis Cocking office, [facility] has copy  Carotid Doppler  See CT angio  CXR   No active cardiopulmonary disease.    EKG  nonspecific ST and T waves changes, 1st degree AV block.   Physical Exam   Present middle-aged Caucasian male currently not in distress. Afebrile. Head is nontraumatic. Neck is supple without bruit. Hearing is normal. Cardiac exam the murmur or gallop. Lungs clear to auscultation. This no pedal edema.  Neurological exam awake alert oriented x3 with normal speech and language function. Extraocular movements are full range without nystagmus. Face is symmetric without weakness. His smile is likely a symmetric. Tongue is midline. Motor system exam revealed no upper or lower expected risk symmetric and equal strength in all 4 extremities. Fine finger movements are slightly diminished on the left. He has some subjective paresthesias in the left hand but no objective sensory  loss. Gait was not tested. ASSESSMENT [name] is a 66 y.o. male with a right thalamic infarct, secondary to likely small vessel disease. On aspirin 81 mg orally every day for secondary stroke prevention.  Hyperlipidemia. On zocor.  OSA   Stroke risk factors:  hyperlipidemia and OSA, TIA  Hospital day # 2  TREATMENT/PLAN Add dipyridamole SR 250 mg/aspirin 25 mg orally q hs for 2 weeks then increase to twice a day for secondary stroke prevention. Aspirin 81mg  every morning x 2 weeks, then discontinue. Cancel MRA and carotid doppler. Check CTA of head and neck. Discontinue telemetry.  Joaquin Music, ANP-BC, GNP-BC [facility] Pager: [phone] 03/26/2011 11:25 AM  Dr. Delia Heady, Stroke Center Medical Director, has personally reviewed chart, pertinent data, examined the patient and developed the plan of care.

## 2011-03-26 NOTE — Progress Notes (Signed)
Physical Therapy Treatment Patient Details Name: Rodney Potts MRN: [id number] DOB: [birth year] Today's Date: 03/26/2011  PT Assessment/Plan  PT - Assessment/Plan Comments on Treatment Session: 66 y.o. male with history of right-sided glioma with radiation treatments who developed stroke like symptoms while carrying fire-wood. Patient found to have an acute right thalamic stroke. Pt continues with Lt. sided weakness and decreased motor control of Lt. side however showed much improvement from yesterday. Recommend pt continue to use RW and have supervision for long distances. Educated pt on need to keep ambulation to short distances at home when he does not have supervision. Neighbors are to come by for intermittant supervision while wife at work.   PT Plan: Discharge plan needs to be updated PT Frequency: Min 4X/week Recommendations for Other Services: OT consult Follow Up Recommendations: Outpatient PT;Supervision - Intermittent Equipment Recommended: Rolling walker with 5" wheels (Declining 3n1) PT Goals  Acute Rehab PT Goals Pt will go Sit to Stand: with modified independence PT Goal: Sit to Stand - Progress: Met Pt will go Stand to Sit: with modified independence PT Goal: Stand to Sit - Progress: Met Pt will Ambulate: >150 feet;with modified independence;with least restrictive assistive device PT Goal: Ambulate - Progress: Progressing toward goal Pt will Go Up / Down Stairs: 3-5 stairs;with modified independence;with least restrictive assistive device PT Goal: Up/Down Stairs - Progress: Progressing toward goal Additional Goals Additional Goal #1: Verbalize signs/symptoms of stroke, appropriate response to symptoms.  PT Goal: Additional Goal #1 - Progress: Progressing toward goal  PT Treatment Precautions/Restrictions  Precautions Precautions: Fall Precaution Comments: Needs supervision with long distance gait, needs RW  Restrictions Weight Bearing Restrictions: No Mobility  (including Balance) Bed Mobility Bed Mobility: Yes Supine to Sit: 7: Independent Sit to Supine: 7: Independent Transfers Transfers: Yes Sit to Stand: From chair/3-in-1;With upper extremity assist;6: Modified independent (Device/Increase time);From bed Stand to Sit: 5: Supervision;To chair/3-in-1 Stand to Sit Details: Supervision for cues to lower self using armrests. Pt progressing to modified independent.  Ambulation/Gait Ambulation/Gait: Yes Ambulation/Gait Assistance: 5: Supervision Ambulation/Gait Assistance Details (indicate cue type and reason): Supervision for long distances for safety as deficits increase with fatigue. Verbal cues for improved heel strike (Lt.) and increased knee extension and hip extension during stance phase.  Ambulation Distance (Feet): 300 Feet Assistive device: Rolling walker Gait Pattern: Step-through pattern;Trunk flexed;Decreased stance time - left;Decreased weight shift to left;Decreased dorsiflexion - left;Left foot flat Stairs: No (Verbally reviewed sequencing and safety for pt and wife)    Exercise  General Exercises - Lower Extremity Long Arc Quad: AROM;Left;20 reps (with full dorsiflexion) Toe Raises: AROM;Both;10 reps;Seated Heel Raises: AROM;Both;10 reps;Seated Other Exercises Other Exercises: Performed sit to/from stand for quadricep strengthening, 2 x5 reps.  End of Session PT - End of Session Equipment Utilized During Treatment: Gait belt Activity Tolerance: Patient tolerated treatment well;Patient limited by fatigue Patient left: in bed;with call bell in reach;with family/visitor present (initially in chair, needed to be in bed for IV Team) Nurse Communication: Mobility status for ambulation General Behavior During Session: [facility] for tasks performed Cognition: [facility] for tasks performed  Sherie Don 03/26/2011, 12:45 PM  Sherie Don) Carleene Mains PT, DPT Acute Rehabilitation [phone]

## 2011-03-26 NOTE — Progress Notes (Signed)
Subjective: Chart reviewed. Overall feeling better. Improved general strength especially in the left leg. Decreased numbness the left side of the face and left upper extremity. Complains of mild headache.  Objective: Blood pressure 101/67, pulse 65, temperature 98.3 F (36.8 C), temperature source Oral, resp. rate 18, height 5' 9.5" (1.765 m), weight 77.4 kg (170 lb 10.2 oz), SpO2 93.00%.  Intake/Output Summary (Last 24 hours) at 03/26/11 1438 Last data filed at 03/26/11 1200  Gross per 24 hour  Intake    470 ml  Output   1250 ml  Net   -780 ml    General Exam: Comfortable. Respiratory System: Clear. No increased work of breathing. Off telemetry Cardiovascular System: First and second heart sounds heard. Regular rate and rhythm. No JVD/murmurs. Gastrointestinal System: Abdomen is non distended, soft and normal bowel sounds heard. Central Nervous System: Alert and oriented. ? Left facial asymmetry and mild dysarthria. Extremities: Grade 4 x 5 power in the left limbs.  Lab Results: Basic Metabolic Panel:  Basename 03/24/11 1259  NA 138  K 4.3  CL 101  CO2 29  GLUCOSE 90  BUN 17  CREATININE 0.97  CALCIUM 9.6  MG --  PHOS --   Liver Function Tests:  Basename 03/24/11 1259  AST 16  ALT 14  ALKPHOS 62  BILITOT 0.2*  PROT 7.2  ALBUMIN 4.0   No results found for this basename: LIPASE:2,AMYLASE:2 in the last 72 hours No results found for this basename: AMMONIA:2 in the last 72 hours CBC:  Basename 03/24/11 1259  WBC 6.2  NEUTROABS 4.1  HGB 15.0  HCT 45.0  MCV 92.4  PLT 230   Cardiac Enzymes: No results found for this basename: CKTOTAL:3,CKMB:3,CKMBINDEX:3,TROPONINI:3 in the last 72 hours BNP: No results found for this basename: PROBNP:3 in the last 72 hours D-Dimer: No results found for this basename: DDIMER:2 in the last 72 hours CBG: No results found for this basename: GLUCAP:6 in the last 72 hours Hemoglobin A1C: No results found for this basename: HGBA1C  in the last 72 hours Fasting Lipid Panel: No results found for this basename: CHOL,HDL,LDLCALC,TRIG,CHOLHDL,LDLDIRECT in the last 72 hours Thyroid Function Tests: No results found for this basename: TSH,T4TOTAL,FREET4,T3FREE,THYROIDAB in the last 72 hours Anemia Panel: No results found for this basename: VITAMINB12,FOLATE,FERRITIN,TIBC,IRON,RETICCTPCT in the last 72 hours Coagulation:  Basename 03/24/11 1259  LABPROT 13.4  INR 1.00   Urine Drug Screen: Drugs of Abuse  No results found for this basename: labopia,  cocainscrnur,  labbenz,  amphetmu,  thcu,  labbarb    Alcohol Level: No results found for this basename: ETH:2 in the last 72 hours  Micro Results: Recent Results (from the past 240 hour(s))  URINE CULTURE     Status: Normal   Collection Time   03/24/11  8:55 PM      Component Value Range Status Comment   Specimen Description URINE, CLEAN CATCH   Final    Special Requests NONE   Final    Setup Time [id number]   Final    Colony Count 35,000 COLONIES/ML   Final    Culture     Final    Value: Multiple bacterial morphotypes present, none predominant. Suggest appropriate recollection if clinically indicated.   Report Status 03/25/2011 FINAL   Final     Studies/Results: Dg Chest 2 View  03/24/2011  *RADIOLOGY REPORT*  Clinical Data: Stroke.  CHEST - 2 VIEW  Comparison: 07/04/2010  Findings: Heart and mediastinal contours are within normal limits. No focal opacities  or effusions.  No acute bony abnormality.  IMPRESSION: No active cardiopulmonary disease.  Original Report Authenticated By: Cyndie Chime, M.D.   Ct Head Wo Contrast  03/24/2011  *RADIOLOGY REPORT*  Clinical Data: Left-sided weakness, confusion  CT HEAD WITHOUT CONTRAST  Technique:  Contiguous axial images were obtained from the base of the skull through the vertex without contrast.  Comparison: [facility] MRI brain dated 01/22/2011  Findings: No evidence of parenchymal hemorrhage or extra-axial fluid  collection. No mass lesion, mass effect, or midline shift.  No CT evidence of acute infarction.  Post-treatment changes in the right frontal lobe.  Old right thalamic lacunar infarct.  Extensive subcortical white matter and periventricular small vessel ischemic changes. Intracranial atherosclerosis.  Mild global cortical atrophy.  Secondary ventricular prominence.  The visualized paranasal sinuses are essentially clear. The mastoid air cells are unopacified.  No evidence of calvarial fracture.  IMPRESSION: No evidence of acute intracranial abnormality.  Post-treatment changes in the right frontal lobe.  Atrophy with extensive small vessel ischemic changes and intracranial atherosclerosis.  Original Report Authenticated By: Charline Bills, M.D.   Mr Brain Wo Contrast  03/24/2011  *RADIOLOGY REPORT*  Clinical Data: New left hand and jaw numbness.  History of brain tumor.  MRI HEAD WITHOUT CONTRAST  Technique:  Multiplanar, multiecho pulse sequences of the brain and surrounding structures were obtained according to standard protocol without intravenous contrast.  Comparison: 03/24/2011 CT earlier in the day  Findings: There is a 4 x 9 mm area of acute infarction affecting the right lateral thalamus adjacent to an area of old lacunar infarction.  There is gliosis and encephalomalacia in the right frontotemporal region where reportedly there has been treatment for a brain tumor in 1982.  I do not see changes of craniotomy either on today's study or on the prior CT. There is no visible recurrent disease based on lack of mass effect and no visible vasogenic edema.  There is no acute hemorrhage, mass lesion, acute hydrocephalus, or extra-axial fluid.  Ventricular enlargement is felt to be on an  ex vacuo basis.  Widespread foci of chronic hemorrhage are seen throughout the brain.  These likely represent post treatment effect from radiotherapy.  Extensive atrophy and chronic microvascular ischemic changes are seen in  the supratentorial compartment. There is no midline shift.  The intracranial vasculature is widely patent. There is no acute sinus or mastoid disease.  Compared with prior CT, there is no significant change.  Compared with prior MR, the acute infarction was not present at that time.  IMPRESSION: Acute subcentimeter right thalamic infarction likely explains the patient's symptoms.  Atrophy and small vessel disease with multiple small foci of chronic hemorrhage, likely related to post treatment effect from radiation.  Post treatment changes right inferior frontal lobe, reportedly brain tumor many years ago with no visible recurrent disease.  Original Report Authenticated By: Elsie Stain, M.D.    Medications: Scheduled Meds:    . aspirin EC  81 mg Oral Daily  . dipyridamole-aspirin  1 capsule Oral QHS   Followed by  . dipyridamole-aspirin  1 capsule Oral BID  . levETIRAcetam  1,000 mg Oral BID  . mulitivitamin with minerals  1 tablet Oral Daily  . PHENobarbital  194.4 mg Oral Daily  . rosuvastatin  20 mg Oral q1800   Continuous Infusions:    . sodium chloride 75 mL/hr at 03/25/11 1153   PRN Meds:.acetaminophen, ondansetron (ZOFRAN) IV, senna-docusate  Assessment/Plan: 1. Right thalamic infarct, secondary to  likely small vessel disease: Neurology followup appreciated. Patient is being started on Aggrenox. Followup CT angiogram of the head and neck. Followup transcranial Doppler results. For outpatient physical therapy on discharge. 2. History of seizures: Secondary to history of glioma. Continue Keppra and phenobarbital. 3. History of Right frontal glioma status post radiation and chemotherapy: Outpatient follow up  Disposition: Possible discharge tomorrow after neurology followup with CT angiogram results. Discussed patient's care at length with his spouse who was at the bedside.   Domani Bakos 03/26/2011, 2:38 PM

## 2011-03-27 MED ORDER — ASPIRIN EC 81 MG PO TBEC: 81.0000 mg | DELAYED_RELEASE_TABLET | Freq: Every day | ORAL | Status: DC

## 2011-03-27 MED ORDER — ACETAMINOPHEN 325 MG PO TABS: 650.0000 mg | ORAL_TABLET | Freq: Four times a day (QID) | ORAL | Status: DC | PRN

## 2011-03-27 MED ORDER — ASPIRIN-DIPYRIDAMOLE ER 25-200 MG PO CP12: 1.0000 | ORAL_CAPSULE | Freq: Every day | ORAL | Status: DC

## 2011-03-27 NOTE — Evaluation (Signed)
Occupational Therapy Evaluation Patient Details Name: Rodney Potts MRN: [id number] DOB: January 13, 1946 Today's Date: 03/27/2011  Problem List:  Patient Active Problem List  Diagnoses  . CVA (cerebral infarction)  . Seizures  . OSA (obstructive sleep apnea)  . IBS (irritable bowel syndrome)  . Glioma of brain    Past Medical History:  Past Medical History  Diagnosis Date  . Brain tumor   . Cancer     PROSTATE CANCER  . TIA (transient ischemic attack)     APPROX N2 MONTHS AGO 01/2011  . Sleep apnea   . IBS (irritable bowel syndrome)   . Shingles   . Cataract   . Seizures    Past Surgical History:  Past Surgical History  Procedure Date  . Eye surgery     CATARACT REMOVAL  . Joint replacement     BILATERAL KNEE REPLACEMENT  . Prostatectomy   . Palate surgery   . Shoulder arthroscopy     OT Assessment/Plan/Recommendation OT Assessment Clinical Impression Statement: Pleasant 66 yr old man suffered a right thalamic CVA.  Pt presents with slight coordiantion deficits in the LUE and weakness in the LLE. He also exhibits slight impairment in balance affecting independence with ADLs.  Feel pt will benefit from follow-up outpatient OT since he is D/C ing later today. Recommend  intermittent supervison at D/C. OT Recommendation/Assessment: All further OT needs can be met in the next venue of care OT Problem List: Decreased coordination;Impaired balance (sitting and/or standing) OT Recommendation Follow Up Recommendations: Outpatient OT Equipment Recommended: None recommended by OT Individuals Consulted Consulted and Agree with Results and Recommendations: Patient OT Goals    OT Evaluation Precautions/Restrictions  Precautions Precautions: Fall Precaution Comments: Needs supervision with long distance gait, needs RW  Required Braces or Orthoses: No Restrictions Weight Bearing Restrictions: No Prior Functioning Home Living Lives With: Spouse Type of Home: House Home  Layout: Two level;Able to live on main level with bedroom/bathroom Alternate Level Stairs-Rails: Left Alternate Level Stairs-Number of Steps: 12 Home Access: Stairs to enter Entrance Stairs-Rails: Right Entrance Stairs-Number of Steps: 3 Bathroom Shower/Tub: Walk-in shower;Tub/shower unit (Walk-in shower is on second level.) Bathroom Toilet: Standard Bathroom Accessibility: Yes Home Adaptive Equipment: Straight cane;Grab bars in shower Additional Comments: Pt has built in shower set on second level walk-in shower. Prior Function Level of Independence: Independent with basic ADLs Driving: Yes Vocation: Retired ADL ADL Eating/Feeding: Simulated;Modified independent Where Assessed - Eating/Feeding: Edge of bed Grooming: Simulated;Supervision/safety Where Assessed - Grooming: Standing at sink Upper Body Bathing: Simulated;Set up Where Assessed - Upper Body Bathing: Sitting, bed;Unsupported Lower Body Bathing: Simulated;Set up Where Assessed - Lower Body Bathing: Sit to stand from bed Upper Body Dressing: Simulated;Set up Where Assessed - Upper Body Dressing: Sitting, bed;Unsupported Lower Body Dressing: Performed;Set up Lower Body Dressing Details (indicate cue type and reason): For gripper socks only Where Assessed - Lower Body Dressing: Sit to stand from bed Toilet Transfer: Performed;Supervision/safety Toilet Transfer Details (indicate cue type and reason): Pt supervision without assistive device to regular toilet. Toilet Transfer Method: Proofreader: Regular height toilet Toileting - Clothing Manipulation: Simulated;Supervision/safety Where Assessed - Toileting Clothing Manipulation: Sit to stand from 3-in-1 or toilet Toileting - Hygiene: Simulated;Modified independent Where Assessed - Toileting Hygiene: Sit on 3-in-1 or toilet Tub/Shower Transfer: Performed;Supervision/safety;Other (comment) Tub/Shower Transfer Details (indicate cue type and reason):  Step over edge of bath tub Tub/Shower Transfer Method: Ambulating Ambulation Related to ADLs: Pt overall close supervision for ambulation to and from the  gym to practice showere transfers.  Demonstrates increased weakness and increased slight knee buckling on the left side. ADL Comments: Pt overall supervision to modified independent with simulated selfcare tasks and ADLs.  Recommend use of a RW for safety and use of his built in shower seat as well.  Overall needs increased time for tying and buttoning secondary to decreased FM capabilities on the left hand. Vision/Perception  Vision - History Baseline Vision: Wears glasses all the time Patient Visual Report: No change from baseline Vision - Assessment Eye Alignment: Within Functional Limits Vision Assessment: Vision tested Additional Comments: WFLs for tracking, saccades, and peripheral vision. Perception Perception: Within Functional Limits Praxis Praxis: Intact Cognition Cognition Arousal/Alertness: Awake/alert Overall Cognitive Status: Appears within functional limits for tasks assessed Orientation Level: Oriented X4 Sensation/Coordination Sensation Light Touch: Impaired Detail (Pt with very slight difference in acuity of the left hand ) Stereognosis: Not tested Hot/Cold: Not tested Proprioception: Appears Intact Coordination Gross Motor Movements are Fluid and Coordinated: No Fine Motor Movements are Fluid and Coordinated: No Coordination and Movement Description: Pt with decreased FM coordination and gross motor coordination in the left hand. Finger Nose Finger Test: Pt with decreased accurracy and speed on the left side compared to the right. Extremity Assessment RUE Assessment RUE Assessment: Within Functional Limits LUE Assessment LUE Assessment: Exceptions to WFL LUE AROM (degrees) LUE Overall AROM Comments: Pt with LUE coordination deficits but strength 4/5 throughout. Mobility  Bed Mobility Bed Mobility:  Yes Supine to Sit: 7: Independent Transfers Transfers: Yes Stand to Sit: 5: Supervision;Without upper extremity assist;To bed Exercises Other Exercises Other Exercises: Pt perfomred theraputty exercise on the left hand using red theraputty to increase dexterity.  Pt also educated and given handout on FM coordination exercises. End of Session OT - End of Session Equipment Utilized During Treatment: Gait belt Activity Tolerance: Patient tolerated treatment well Patient left: in bed;with call bell in reach General Behavior During Session: [facility] for tasks performed Cognition: [facility] for tasks performed   Alayiah Fontes OTR/L 03/27/2011, 10:45 AM  Pager number [phone]

## 2011-03-27 NOTE — Discharge Summary (Signed)
Discharge Summary  Rodney Potts MR#: [id number]  DOB:May 16, 1945  Date of Admission: 03/24/2011 Date of Discharge: 03/27/2011  Patient's PCP: Pamelia Hoit, MD, MD  Attending Physician:Salaam Battershell  Consults: Neurology: Delia Heady, M.D.   Discharge Diagnoses: 1. Right thalamic infarct/CVA 2. Hyperlipidemia 3. History of seizures 4. 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 History of obstructive sleep apnea 6. History of recent TIA   Brief Admitting History and Physical 66 year old Caucasian gentleman with prior history of right frontal glioma status post radiation and chemotherapy, history of obstructive sleep apnea, history of recent TIA per patient in approximately 2 months ago who is on daily baby aspirin, history of seizures who presents to the ED with a one to two-day history of left hand numbness and left facial numbness. Patient states that one day prior to admission at approximately 11 AM after bringing in some firewood he noted some left hand numbness he also noted some left facial numbness and tingling which has been constant in nature and hasn't improved. Per patient's wife overnight patient almost fell due to left lower extremity weakness to the point that it affected his gait. Patient had to use his cane that he hasn't used in a while. Patient denies any slurred speech, no facial droop, no fever, no chills, no chest pain, no nausea, no vomiting, no abdominal pain, no diarrhea, no constipation, no dysuria. Patient as did have some intermittent nonproductive cough as been ongoing for the past 6 months to one year. Patient does endorse some generalized weakness. Patient said that and due to non-improvement of his symptoms and due to his left lower extremity weakness subsequently presented to the emergency room. In the emergency room CBC which was obtained was within normal limits. BMP which was obtained was within normal limits. CT of the head  which was done was negative for any acute intracranial abnormality and showed posttreatment changes in the right frontal lobe. MRI of the head was done which showed an acute subcentimeter right thalamic infarction. We were called to admit the patient for further evaluation and management.   Discharge Medications Current Discharge Medication List    START taking these medications   Details  acetaminophen (TYLENOL) 325 MG tablet Take 2 tablets (650 mg total) by mouth every 6 (six) hours as needed (headache prevention. Take half an hour before Aggrenox for 1 week only.).    dipyridamole-aspirin (AGGRENOX) 25-200 MG per 12 hr capsule Take 1 capsule by mouth at bedtime. Take 1 capsule by mouth at bedtime daily until 04/08/2011. From 04/09/2011, take 1 capsule by mouth 2 times daily. Qty: 60 capsule, Refills: 0      CONTINUE these medications which have CHANGED   Details  aspirin EC 81 MG tablet Take 1 tablet (81 mg total) by mouth daily. Stop taking after 04/08/2011 dose.      CONTINUE these medications which have NOT CHANGED   Details  atorvastatin (LIPITOR) 40 MG tablet Take 40 mg by mouth daily.    calcium-vitamin D (OSCAL WITH D) 500-200 MG-UNIT per tablet Take 1 tablet by mouth daily.    co-enzyme Q-10 30 MG capsule Take 200 mg by mouth daily.    levETIRAcetam (KEPPRA) 1000 MG tablet Take 1,000 mg by mouth 2 (two) times daily.    Multiple Vitamin (MULITIVITAMIN WITH MINERALS) TABS Take 1 tablet by mouth daily.    PHENobarbital (LUMINAL) 97.2 MG tablet Take 97.2 mg by mouth daily.      STOP taking these medications  clopidogrel (PLAVIX) 75 MG tablet Comments:  Reason for Stopping:          Hospital Course: 1. Right thalamic infarct, secondary to small vessel disease: Patient was admitted. Neurology was consulted and extensive stroke workup was completed. He was started on Aggrenox 1 capsule orally at bedtime which will be titrated to twice daily in 2 weeks. He is to continue  low dose aspirin until he starts twice daily Aggrenox. Physical therapy and occupational therapy recommend outpatient therapy which will be arranged. He will followup with the neurologist in 2 weeks from hospital discharge. 2. History of seizures: No no seizures while hospitalized. Continue Keppra and phenobarbital. 3. Hyperlipidemia: Continue home statins 4. Right frontal glioma status post radiation and chemotherapy:outpatient followup  Day of Discharge BP 114/75  Pulse 55  Temp(Src) 97.4 F (36.3 C) (Oral)  Resp 18  Ht 5' 9.5" (1.765 m)  Wt 77.4 kg (170 lb 10.2 oz)  BMI 24.84 kg/m2  SpO2 96% General Exam: Comfortable.  Respiratory System: Clear. No increased work of breathing. Off telemetry  Cardiovascular System: First and second heart sounds heard. Regular rate and rhythm. No JVD/murmurs.  Gastrointestinal System: Abdomen is non distended, soft and normal bowel sounds heard.  Central Nervous System: Alert and oriented. ? Left facial asymmetry and mild dysarthria.  Extremities: Grade 4 x 5 power in the left limbs  Lab data 1. BMP was unremarkable. BUN 17 and creatinine 0.97 2. Hepatic panel unremarkable. 3. CBC within normal limits 4. Coagulation in the cyst were within normal limits. 5. CBGs ranged between 90-111 mg/dL. 6. Urinalysis negative for features of urinary tract infection. 7. Urine culture was suggestive of contamination.  CT of the brain No evidence of acute intracranial abnormality. Post-treatment changes in the right frontal lobe. Atrophy with extensive small vessel ischemic changes and intracranial atherosclerosis.  CT angio of head 1. 5 mm segment of occluded right PCA with reconstituted distal right PCA flow. This is congruent with the recent MRI finding of acute right thalamic lacunar infarct. 2. Otherwise negative posterior circulation. 3. ICA siphon atherosclerosis without stenosis. Mild left MCA origin stenosis. Multifocal right MCA branch irregularity without  branch stenosis or occlusion. 4. No new intracranial abnormality.  CT angio of neck 1. Mild carotid bifurcation and distal arch atherosclerosis with no arterial stenosis in the neck.  MRI of the brain Acute subcentimeter right thalamic infarction likely explains the patient's symptoms. Atrophy and small vessel disease with multiple small foci of chronic hemorrhage, likely related to post treatment effect from radiation. Post treatment changes right inferior frontal lobe, reportedly brain tumor many years ago with no visible recurrent disease.  MRA of the brain Done 6 wks ago as on outpatient through [facility]  2D Echocardiogram Done 6 weeks ago at Dr. Kandis Cocking office, [facility] has copy  Carotid Doppler See CT angio  CXR No active cardiopulmonary disease.  EKG nonspecific ST and T waves changes, 1st degree AV block.  Transcranial Doppler: Summary: This was a normal transcranial Doppler study, with normal flow direction and velocity of all identified vessels of the anterior and posterior circulations, with no evidence of stenosis, vasospasm or occlusion. Low vertebral mean flow velocities are due to suboptimal waveforms from poor windows.       Disposition: Discharged home in stable condition  Diet: Heart healthy  Activity: Increase activity gradually  Follow-up Appts: Discharge Orders    Future Orders Please Complete By Expires   Diet - low sodium heart healthy  Increase activity slowly      Call MD for:  severe uncontrolled pain         TESTS THAT NEED FOLLOW-UP None  Time spent on discharge, talking to the patient, and coordinating care: 45 mins.   SignedMarcellus Scott, MD 03/27/2011, 11:48 AM

## 2011-03-27 NOTE — Progress Notes (Signed)
Stroke Team Progress Note  HISTORY Rodney Potts is a 66 year old Caucasian gentleman with prior history of right frontal glioma status post radiation and chemotherapy, history of obstructive sleep apnea, history of recent TIA per patient in approximately 2 months ago who is on daily baby aspirin, history of seizures who presents to the ED with a one to two-day history of left hand numbness and left facial numbness. Patient states that one day prior to admission at approximately 11 AM after bringing in some firewood he noted some left hand numbness he also noted some left facial numbness and tingling which has been constant in nature and hasn't improved. Per patient's wife overnight patient almost fell due to left lower extremity weakness to the point that it affected his gait. Patient had to use his cane that he hasn't used in a while. Patient denies any slurred speech, no facial droop, no fever, no chills, no chest pain, no nausea, no vomiting, no abdominal pain, no diarrhea, no constipation, no dysuria. Patient as did have some intermittent nonproductive cough as been ongoing for the past 6 months to one year. Patient does endorse some generalized weakness. Patient said that and due to non-improvement of his symptoms and due to his left lower extremity weakness subsequently presented to the emergency room. In the emergency room CBC which was obtained was within normal limits. He met which was obtained was within normal limits. CT of the head which was done was negative for any acute intracranial abnormality and showed posttreatment changes in the right frontal lobe. MRI of the head was done which showed an acute subcentimeter right thalamic infarction. We were called to admit the patient for further evaluation and management.  SUBJECTIVE His wife is at the bedside. Overall he feels his condition is stable. He had a stroke 6 weeks ago - was seen by Dr. Alanda Amass and Dr. Anne Hahn as an OP with complete  workup.  OBJECTIVE Most recent Vital Signs: Temp: 97.9 F (36.6 C) (01/22 0500) Temp src: Oral (01/22 0500) BP: 110/76 mmHg (01/22 0500) Pulse Rate: 57  (01/22 0500) Respiratory Rate: 18 O2 Saturation: 96%  CBG (last 3) No results found for this basename: GLUCAP:3 in the last 72 hours Intake/Output from previous day: 01/21 0701 - 01/22 0700 In: 800 [P.O.:800] Out: 300 [Urine:300]  IV Fluid Intake:    Medications     . aspirin EC  81 mg Oral Daily  . dipyridamole-aspirin  1 capsule Oral QHS   Followed by  . dipyridamole-aspirin  1 capsule Oral BID  . levETIRAcetam  1,000 mg Oral BID  . mulitivitamin with minerals  1 tablet Oral Daily  . PHENobarbital  194.4 mg Oral Daily  . rosuvastatin  20 mg Oral q1800  PRN acetaminophen, iohexol, ondansetron (ZOFRAN) IV, senna-docusate  Diet:  Cardiac thin liquids Activity:   Up with assistance DVT Prophylaxis:  SCDs   Studies: CBC    Component Value Date/Time   WBC 6.2 03/24/2011 1259   RBC 4.87 03/24/2011 1259   HGB 15.0 03/24/2011 1259   HCT 45.0 03/24/2011 1259   PLT 230 03/24/2011 1259   MCV 92.4 03/24/2011 1259   MCH 30.8 03/24/2011 1259   MCHC 33.3 03/24/2011 1259   RDW 14.2 03/24/2011 1259   LYMPHSABS 1.1 03/24/2011 1259   MONOABS 0.9 03/24/2011 1259   EOSABS 0.1 03/24/2011 1259   BASOSABS 0.0 03/24/2011 1259   CMP    Component Value Date/Time   NA 138 03/24/2011 1259   K 4.3  03/24/2011 1259   CL 101 03/24/2011 1259   CO2 29 03/24/2011 1259   GLUCOSE 90 03/24/2011 1259   BUN 17 03/24/2011 1259   CREATININE 0.97 03/24/2011 1259   CALCIUM 9.6 03/24/2011 1259   PROT 7.2 03/24/2011 1259   ALBUMIN 4.0 03/24/2011 1259   AST 16 03/24/2011 1259   ALT 14 03/24/2011 1259   ALKPHOS 62 03/24/2011 1259   BILITOT 0.2* 03/24/2011 1259   GFRNONAA 85* 03/24/2011 1259   GFRAA >90 03/24/2011 1259   COAGS Lab Results  Component Value Date   INR 1.00 03/24/2011   INR 1.04 10/23/2010   INR 2.01* 03/12/2009   Lipid Panel No results found for this  basename: chol,  trig,  hdl,  cholhdl,  vldl,  ldlcalc   HgbA1C  No results found for this basename: HGBA1C   Urine Drug Screen  No results found for this basename: labopia,  cocainscrnur,  labbenz,  amphetmu,  thcu,  labbarb    Alcohol Level No results found for this basename: eth   CT of the brain   No evidence of acute intracranial abnormality.  Post-treatment changes in the right frontal lobe.  Atrophy with extensive small vessel ischemic changes and intracranial atherosclerosis.   CT angio of head  1.  5 mm segment of occluded right PCA with reconstituted distal right PCA flow. This is congruent with the recent MRI finding of acute right thalamic lacunar infarct. 2.  Otherwise negative posterior circulation. 3.  ICA siphon atherosclerosis without stenosis.  Mild left MCA origin stenosis.  Multifocal right MCA branch irregularity without branch stenosis or occlusion. 4.  No new intracranial abnormality.    CT angio of neck  1.  Mild carotid bifurcation and distal arch atherosclerosis with no arterial stenosis in the neck.  MRI of the brain  Acute subcentimeter right thalamic infarction likely explains the patient's symptoms.  Atrophy and small vessel disease with multiple small foci of chronic hemorrhage, likely related to post treatment effect from radiation.  Post treatment changes right inferior frontal lobe, reportedly brain tumor many years ago with no visible recurrent disease.   MRA of the brain  Done 6 wks ago as on outpatient through [facility]  2D Echocardiogram  Done 6 weeks ago at Dr. Kandis Cocking office, [facility] has copy  Carotid Doppler  See CT angio  CXR   No active cardiopulmonary disease.    EKG  nonspecific ST and T waves changes, 1st degree AV block.   Physical Exam   Present middle-aged Caucasian male currently not in distress. Afebrile. Head is nontraumatic. Neck is supple without bruit. Hearing is normal. Cardiac exam the murmur or gallop. Lungs clear to auscultation. This  no pedal edema.  Neurological exam awake alert oriented x3 with normal speech and language function. Extraocular movements are full range without nystagmus. Face is symmetric without weakness. His smile is likely a symmetric. Tongue is midline. Motor system exam revealed no upper or lower expected risk symmetric and equal strength in all 4 extremities. Fine finger movements are slightly diminished on the left. He has some subjective paresthesias in the left hand but no objective sensory loss. Gait was not tested.  ASSESSMENT [name] is a 66 y.o. male with a right thalamic infarct, secondary to small vessel disease. On aspirin 81 mg orally every day and Aggrenox for secondary stroke prevention.  Hyperlipidemia. On zocor.  OSA   Stroke risk factors:  hyperlipidemia and OSA, TIA  Hospital day # 3  TREATMENT/PLAN Add dipyridamole SR 250 mg/aspirin 25 mg orally q hs for 2 weeks then increase to twice a day for secondary stroke prevention. Aspirin 81mg  every morning x 2 weeks, then discontinue. Take tylenol 650mg  1 hour prior to Aggrenosx dose x 1 week only. Outpatient PT & OT evaluation.  Joaquin Music, ANP-BC, GNP-BC [facility] Pager: [phone] 03/27/2011 10:27 AM  Dr. Delia Heady, Stroke Center Medical Director, has personally reviewed chart, pertinent data, examined the patient and developed the plan of care.

## 2011-03-27 NOTE — Progress Notes (Signed)
   CARE MANAGEMENT NOTE 03/27/2011  Patient:  XAYDEN, LINSEY   Account Number:  [id number]  Date Initiated:  03/27/2011  Documentation initiated by:  [facility]  Subjective/Objective Assessment:   Admitted with CVA. Lives with wife.     Action/Plan:   PT and OT evals-recommended outpatient PT and OT and rolling walker   Anticipated DC Date:  03/27/2011   Anticipated DC Plan:  HOME/SELF CARE      DC Planning Services  CM consult      Choice offered to / List presented to:     DME arranged  Levan Hurst      DME agency  [facility]        Status of service:  Completed, signed off Medicare Important Message given?   (If response is "NO", the following Medicare IM given date fields will be blank) Date Medicare IM given:   Date Additional Medicare IM given:    Discharge Disposition:  HOME/SELF CARE  Per UR Regulation:  Reviewed for med. necessity/level of care/duration of stay  Comments:  PCP Dr. Benedetto Goad  03/27/11 Spoke with patient and his wife about outpatient PT and OT. They are agreeable to going to [facility].Faxed a referral and PT and OT notes to [phone] and confirmed   receipt. Gave patient's wife directions and phone #. Offered to make first appt but wife stated that she preferred to make appt due to her schedule. Neurorehab will call them at home to make the appt.Contacted [facility] and requested rolling walker be delivered to room prior to discharge.   Jacquelynn Cree RN, BSN, CCM

## 2011-03-27 NOTE — Progress Notes (Signed)
PT Cancellation Note  Pt going home soon. Answered questions about RW, pt and wife have no further questions.   Maleny Candy (Beverely Pace) Carleene Mains PT, DPT Acute Rehabilitation [phone]

## 2011-04-09 ENCOUNTER — Ambulatory Visit: Payer: BC Managed Care – PPO | Attending: Neurology | Admitting: Physical Therapy

## 2011-04-09 DIAGNOSIS — M6281 Muscle weakness (generalized): Secondary | ICD-10-CM | POA: Insufficient documentation

## 2011-04-09 DIAGNOSIS — R269 Unspecified abnormalities of gait and mobility: Secondary | ICD-10-CM | POA: Insufficient documentation

## 2011-04-09 DIAGNOSIS — IMO0001 Reserved for inherently not codable concepts without codable children: Secondary | ICD-10-CM | POA: Insufficient documentation

## 2011-04-13 ENCOUNTER — Ambulatory Visit: Payer: BC Managed Care – PPO | Admitting: Physical Therapy

## 2011-04-18 ENCOUNTER — Ambulatory Visit: Payer: BC Managed Care – PPO | Admitting: Physical Therapy

## 2011-04-20 ENCOUNTER — Ambulatory Visit: Payer: BC Managed Care – PPO | Admitting: Physical Therapy

## 2011-04-23 ENCOUNTER — Ambulatory Visit: Payer: BC Managed Care – PPO | Admitting: Physical Therapy

## 2011-04-26 ENCOUNTER — Ambulatory Visit: Payer: BC Managed Care – PPO | Admitting: Physical Therapy

## 2011-05-02 ENCOUNTER — Ambulatory Visit: Payer: BC Managed Care – PPO | Admitting: Physical Therapy

## 2011-05-04 ENCOUNTER — Ambulatory Visit: Payer: Medicare Other | Attending: Neurology | Admitting: Physical Therapy

## 2011-05-04 DIAGNOSIS — M6281 Muscle weakness (generalized): Secondary | ICD-10-CM | POA: Insufficient documentation

## 2011-05-04 DIAGNOSIS — IMO0001 Reserved for inherently not codable concepts without codable children: Secondary | ICD-10-CM | POA: Insufficient documentation

## 2011-05-04 DIAGNOSIS — R269 Unspecified abnormalities of gait and mobility: Secondary | ICD-10-CM | POA: Insufficient documentation

## 2011-05-04 DIAGNOSIS — I69998 Other sequelae following unspecified cerebrovascular disease: Secondary | ICD-10-CM | POA: Insufficient documentation

## 2011-05-06 ENCOUNTER — Other Ambulatory Visit: Payer: Self-pay | Admitting: Internal Medicine

## 2011-05-09 ENCOUNTER — Ambulatory Visit: Payer: Medicare Other | Admitting: Physical Therapy

## 2011-05-11 ENCOUNTER — Ambulatory Visit: Payer: Medicare Other | Admitting: Physical Therapy

## 2011-05-14 ENCOUNTER — Ambulatory Visit: Payer: Medicare Other | Admitting: Physical Therapy

## 2011-05-16 ENCOUNTER — Ambulatory Visit: Payer: BC Managed Care – PPO | Admitting: Physical Therapy

## 2011-05-16 ENCOUNTER — Ambulatory Visit: Payer: Medicare Other | Admitting: Physical Therapy

## 2011-05-21 ENCOUNTER — Ambulatory Visit: Payer: Medicare Other | Admitting: Physical Therapy

## 2011-05-25 ENCOUNTER — Ambulatory Visit: Payer: BC Managed Care – PPO | Admitting: Physical Therapy

## 2011-05-30 ENCOUNTER — Ambulatory Visit: Payer: Medicare Other | Admitting: Physical Therapy

## 2011-06-09 ENCOUNTER — Emergency Department (HOSPITAL_COMMUNITY): Payer: Medicare Other

## 2011-06-09 ENCOUNTER — Other Ambulatory Visit: Payer: Self-pay

## 2011-06-09 ENCOUNTER — Encounter (HOSPITAL_COMMUNITY): Payer: Self-pay | Admitting: *Deleted

## 2011-06-09 ENCOUNTER — Inpatient Hospital Stay (HOSPITAL_COMMUNITY)
Admission: EM | Admit: 2011-06-09 | Discharge: 2011-06-12 | DRG: 093 | Disposition: A | Discharge status: Home Health | Payer: Medicare Other | Attending: Internal Medicine | Admitting: Internal Medicine

## 2011-06-09 DIAGNOSIS — R4789 Other speech disturbances: Principal | ICD-10-CM | POA: Diagnosis present

## 2011-06-09 DIAGNOSIS — Z888 Allergy status to other drugs, medicaments and biological substances status: Secondary | ICD-10-CM

## 2011-06-09 DIAGNOSIS — I639 Cerebral infarction, unspecified: Secondary | ICD-10-CM

## 2011-06-09 DIAGNOSIS — Z8546 Personal history of malignant neoplasm of prostate: Secondary | ICD-10-CM

## 2011-06-09 DIAGNOSIS — R531 Weakness: Secondary | ICD-10-CM | POA: Diagnosis present

## 2011-06-09 DIAGNOSIS — Z87891 Personal history of nicotine dependence: Secondary | ICD-10-CM

## 2011-06-09 DIAGNOSIS — I498 Other specified cardiac arrhythmias: Secondary | ICD-10-CM | POA: Diagnosis present

## 2011-06-09 DIAGNOSIS — Z8673 Personal history of transient ischemic attack (TIA), and cerebral infarction without residual deficits: Secondary | ICD-10-CM

## 2011-06-09 DIAGNOSIS — G4733 Obstructive sleep apnea (adult) (pediatric): Secondary | ICD-10-CM | POA: Diagnosis present

## 2011-06-09 DIAGNOSIS — R4781 Slurred speech: Secondary | ICD-10-CM | POA: Diagnosis present

## 2011-06-09 DIAGNOSIS — T423X5A Adverse effect of barbiturates, initial encounter: Secondary | ICD-10-CM | POA: Diagnosis present

## 2011-06-09 DIAGNOSIS — Z96659 Presence of unspecified artificial knee joint: Secondary | ICD-10-CM

## 2011-06-09 DIAGNOSIS — R569 Unspecified convulsions: Secondary | ICD-10-CM | POA: Diagnosis present

## 2011-06-09 DIAGNOSIS — K589 Irritable bowel syndrome without diarrhea: Secondary | ICD-10-CM | POA: Diagnosis present

## 2011-06-09 DIAGNOSIS — Z6826 Body mass index (BMI) 26.0-26.9, adult: Secondary | ICD-10-CM

## 2011-06-09 DIAGNOSIS — Z880 Allergy status to penicillin: Secondary | ICD-10-CM

## 2011-06-09 DIAGNOSIS — Z882 Allergy status to sulfonamides status: Secondary | ICD-10-CM

## 2011-06-09 DIAGNOSIS — G40909 Epilepsy, unspecified, not intractable, without status epilepticus: Secondary | ICD-10-CM | POA: Diagnosis present

## 2011-06-09 DIAGNOSIS — Z79899 Other long term (current) drug therapy: Secondary | ICD-10-CM

## 2011-06-09 DIAGNOSIS — Z9849 Cataract extraction status, unspecified eye: Secondary | ICD-10-CM

## 2011-06-09 DIAGNOSIS — R5381 Other malaise: Secondary | ICD-10-CM | POA: Diagnosis present

## 2011-06-09 DIAGNOSIS — C719 Malignant neoplasm of brain, unspecified: Secondary | ICD-10-CM

## 2011-06-09 DIAGNOSIS — R001 Bradycardia, unspecified: Secondary | ICD-10-CM

## 2011-06-09 HISTORY — DX: Cerebral infarction, unspecified: I63.9

## 2011-06-09 LAB — URINALYSIS, ROUTINE W REFLEX MICROSCOPIC
Bilirubin Urine: NEGATIVE
Hgb urine dipstick: NEGATIVE
Specific Gravity, Urine: 1.014 (ref 1.005–1.030)
Urobilinogen, UA: 0.2 mg/dL (ref 0.0–1.0)
pH: 6.5 (ref 5.0–8.0)

## 2011-06-09 LAB — COMPREHENSIVE METABOLIC PANEL
ALT: 10 U/L (ref 0–53)
AST: 17 U/L (ref 0–37)
Albumin: 3.6 g/dL (ref 3.5–5.2)
CO2: 32 mEq/L (ref 19–32)
Calcium: 9.5 mg/dL (ref 8.4–10.5)
Creatinine, Ser: 0.92 mg/dL (ref 0.50–1.35)
GFR calc non Af Amer: 87 mL/min — ABNORMAL LOW (ref >90)
Sodium: 142 mEq/L (ref 135–145)
Total Protein: 6.2 g/dL (ref 6.0–8.3)

## 2011-06-09 LAB — CBC
HCT: 42.8 % (ref 39.0–52.0)
MCH: 31.4 pg (ref 26.0–34.0)
MCHC: 33.4 g/dL (ref 30.0–36.0)
MCV: 94.1 fL (ref 78.0–100.0)
Platelets: 223 10*3/uL (ref 150–400)
RDW: 14.5 % (ref 11.5–15.5)
WBC: 6.3 10*3/uL (ref 4.0–10.5)

## 2011-06-09 LAB — LACTIC ACID, PLASMA: Lactic Acid, Venous: 1 mmol/L (ref 0.5–2.2)

## 2011-06-09 LAB — DIFFERENTIAL
Basophils Absolute: 0 10*3/uL (ref 0.0–0.1)
Basophils Relative: 0 % (ref 0–1)
Eosinophils Absolute: 0.2 10*3/uL (ref 0.0–0.7)
Eosinophils Relative: 2 % (ref 0–5)
Lymphocytes Relative: 25 % (ref 12–46)
Monocytes Absolute: 0.6 10*3/uL (ref 0.1–1.0)

## 2011-06-09 LAB — PHENOBARBITAL LEVEL: Phenobarbital: 51.5 ug/mL (ref 15.0–40.0)

## 2011-06-09 LAB — POCT I-STAT TROPONIN I: Troponin i, poc: 0 ng/mL (ref 0.00–0.08)

## 2011-06-09 LAB — RAPID URINE DRUG SCREEN, HOSP PERFORMED
Amphetamines: NOT DETECTED
Barbiturates: POSITIVE — AB

## 2011-06-09 LAB — PROTIME-INR: INR: 1.11 (ref 0.00–1.49)

## 2011-06-09 LAB — CARDIAC PANEL(CRET KIN+CKTOT+MB+TROPI)
Relative Index: 4.6 — ABNORMAL HIGH (ref 0.0–2.5)
Total CK: 236 U/L — ABNORMAL HIGH (ref 7–232)

## 2011-06-09 MED ORDER — LEVETIRACETAM 500 MG PO TABS: 500.0000 mg | ORAL_TABLET | Freq: Two times a day (BID) | ORAL | Status: DC

## 2011-06-09 MED ORDER — SODIUM CHLORIDE 0.9 % IJ SOLN
3.0000 mL | Freq: Two times a day (BID) | INTRAMUSCULAR | Status: DC
  Administered 2011-06-09 – 2011-06-12 (×6): 3 mL via INTRAVENOUS

## 2011-06-09 MED ORDER — SODIUM CHLORIDE 0.9 % IV BOLUS (SEPSIS): 250.0000 mL | Freq: Once | INTRAVENOUS | Status: DC

## 2011-06-09 MED ORDER — ONDANSETRON HCL 4 MG/2ML IJ SOLN: 4.0000 mg | Freq: Three times a day (TID) | INTRAMUSCULAR | Status: AC | PRN

## 2011-06-09 MED ORDER — ADULT MULTIVITAMIN W/MINERALS CH
1.0000 | ORAL_TABLET | Freq: Every day | ORAL | Status: DC
  Administered 2011-06-09 – 2011-06-12 (×4): 1 via ORAL
  Filled 2011-06-09 (×4): qty 1

## 2011-06-09 MED ORDER — SENNOSIDES-DOCUSATE SODIUM 8.6-50 MG PO TABS
1.0000 | ORAL_TABLET | Freq: Every evening | ORAL | Status: DC | PRN
  Filled 2011-06-09: qty 1

## 2011-06-09 MED ORDER — ATROPINE SULFATE 1 MG/ML IJ SOLN: 0.5000 mg | Freq: Once | INTRAMUSCULAR | Status: DC

## 2011-06-09 MED ORDER — ATORVASTATIN CALCIUM 40 MG PO TABS
40.0000 mg | ORAL_TABLET | Freq: Every day | ORAL | Status: DC
  Administered 2011-06-09 – 2011-06-11 (×3): 40 mg via ORAL
  Filled 2011-06-09 (×4): qty 1

## 2011-06-09 MED ORDER — ONDANSETRON HCL 4 MG/2ML IJ SOLN: 4.0000 mg | Freq: Four times a day (QID) | INTRAMUSCULAR | Status: DC | PRN

## 2011-06-09 MED ORDER — ATROPINE SULFATE 0.1 MG/ML IJ SOLN
INTRAMUSCULAR | Status: AC
  Filled 2011-06-09: qty 10

## 2011-06-09 MED ORDER — ATROPINE SULFATE 1 MG/ML IJ SOLN
0.5000 mg | Freq: Once | INTRAMUSCULAR | Status: AC
  Administered 2011-06-09: 0.5 mg via INTRAVENOUS
  Filled 2011-06-09: qty 1

## 2011-06-09 MED ORDER — PHENOBARBITAL 32.4 MG PO TABS: 194.4000 mg | ORAL_TABLET | Freq: Every day | ORAL | Status: DC

## 2011-06-09 MED ORDER — ACETAMINOPHEN 650 MG RE SUPP: 650.0000 mg | RECTAL | Status: DC | PRN

## 2011-06-09 MED ORDER — GADOBENATE DIMEGLUMINE 529 MG/ML IV SOLN
15.0000 mL | Freq: Once | INTRAVENOUS | Status: AC | PRN
  Administered 2011-06-09: 15 mL via INTRAVENOUS

## 2011-06-09 MED ORDER — DARIFENACIN HYDROBROMIDE ER 7.5 MG PO TB24
7.5000 mg | ORAL_TABLET | Freq: Every day | ORAL | Status: DC
  Administered 2011-06-09 – 2011-06-12 (×4): 7.5 mg via ORAL
  Filled 2011-06-09 (×4): qty 1

## 2011-06-09 MED ORDER — ASPIRIN-DIPYRIDAMOLE ER 25-200 MG PO CP12
1.0000 | ORAL_CAPSULE | Freq: Two times a day (BID) | ORAL | Status: DC
  Administered 2011-06-09 – 2011-06-12 (×6): 1 via ORAL
  Filled 2011-06-09 (×7): qty 1

## 2011-06-09 MED ORDER — CALCIUM CARBONATE-VITAMIN D 500-200 MG-UNIT PO TABS
1.0000 | ORAL_TABLET | Freq: Every day | ORAL | Status: DC
  Administered 2011-06-09 – 2011-06-12 (×4): 1 via ORAL
  Filled 2011-06-09 (×4): qty 1

## 2011-06-09 MED ORDER — SODIUM CHLORIDE 0.9 % IJ SOLN: 3.0000 mL | INTRAMUSCULAR | Status: DC | PRN

## 2011-06-09 MED ORDER — ACETAMINOPHEN 325 MG PO TABS
650.0000 mg | ORAL_TABLET | ORAL | Status: DC | PRN
  Administered 2011-06-10 – 2011-06-12 (×4): 650 mg via ORAL
  Filled 2011-06-09 (×4): qty 2
  Filled 2011-06-09: qty 1

## 2011-06-09 MED ORDER — SODIUM CHLORIDE 0.9 % IV SOLN: 250.0000 mL | INTRAVENOUS | Status: DC | PRN

## 2011-06-09 MED ORDER — LEVETIRACETAM 500 MG PO TABS
500.0000 mg | ORAL_TABLET | Freq: Every day | ORAL | Status: DC
  Administered 2011-06-09 – 2011-06-12 (×4): 500 mg via ORAL
  Filled 2011-06-09 (×4): qty 1

## 2011-06-09 MED ORDER — LEVETIRACETAM 500 MG PO TABS
1000.0000 mg | ORAL_TABLET | Freq: Every day | ORAL | Status: DC
  Administered 2011-06-09 – 2011-06-11 (×3): 1000 mg via ORAL
  Filled 2011-06-09 (×4): qty 2

## 2011-06-09 MED ORDER — SODIUM CHLORIDE 0.9 % IV SOLN
INTRAVENOUS | Status: AC
  Administered 2011-06-09: 22:00:00 via INTRAVENOUS

## 2011-06-09 MED ORDER — ASPIRIN 81 MG PO CHEW
324.0000 mg | CHEWABLE_TABLET | Freq: Once | ORAL | Status: AC
  Administered 2011-06-09: 324 mg via ORAL
  Filled 2011-06-09: qty 4

## 2011-06-09 NOTE — ED Notes (Signed)
Patient reported to have traveled home from [place] last night,  Went to bed at 10pm.  Today patient could not get up out of bed.  He has hx of cva and bil knee surgery.  He also has hx of brain cancer.  Patient with reported slowed speech today,  No other new neuro deficits.  Patient is alert and oriented.

## 2011-06-09 NOTE — Progress Notes (Signed)
CRITICAL VALUE ALERT  Critical value received:  ckmb of 10.8  Date of notification:  06/09/2011  Time of notification:  1850  Critical value read back:yes  Nurse who received alert:  Kandyce Dieguez L.Maja Mccaffery RN,BSN  MD notified (1st page):  vann  Time of first page:  1850  MD notified (2nd page):  Time of second page:  Responding MD:  vann  Time MD responded:  [phone]

## 2011-06-09 NOTE — H&P (Signed)
Patient's PCP: Pamelia Hoit, MD, MD  Chief Complaint: slurred speech and weakness  History of Present Illness: Rodney Potts is a 66 y.o. white male who just came back from [facility] yesterday.  Has some mild difficulty walking at baseline but wife thinks it was worse yesterday as he walked upstairs to bed.  He does have baseline walking problems related to his knee replacements.  Patient woke up this AM with generalized weakness and slurred speech, no facial drooping.  No fever. No chills.  Patient was recently hospitalized with a CVA back in January 2013.  He also has a history of TIAs and a glioma that was treated with radiation and chemotherapy.  He thinks he is constantly bradycardic and this is not new.      Meds: Scheduled Meds:    . aspirin  324 mg Oral Once  . atropine      . atropine  0.5 mg Intravenous Once   Continuous Infusions:  PRN Meds:. Allergies: Amoxicillin; Dilaudid; Lactose intolerance (gi); and Sulfa antibiotics Past Medical History  Diagnosis Date  . Brain tumor     glioma  . Cancer     PROSTATE CANCER  . TIA (transient ischemic attack)     APPROX N2 MONTHS AGO 01/2011  . Sleep apnea   . IBS (irritable bowel syndrome)   . Shingles   . Cataract   . Seizures   . CVA (cerebral infarction)     R thalamic  . Stroke    Past Surgical History  Procedure Date  . Eye surgery     CATARACT REMOVAL  . Joint replacement     BILATERAL KNEE REPLACEMENT  . Prostatectomy   . Palate surgery   . Shoulder arthroscopy    No family history on file. History   Social History  . Marital Status: Married    Spouse Name: N/A    Number of Children: N/A  . Years of Education: N/A   Occupational History  . Not on file.   Social History Main Topics  . Smoking status: Former Games developer  . Smokeless tobacco: Not on file  . Alcohol Use: 3.0 oz/week    5 Glasses of wine per week  . Drug Use:   . Sexually Active:    Other Topics Concern  . Not on file   Social  History Narrative  . No narrative on file   Review of Systems: All systems reviewed with the patient and positive as per history of present illness, otherwise all other systems are negative.   Physical Exam: Blood pressure 94/72, pulse 48, temperature 98 F (36.7 C), temperature source Oral, resp. rate 11, SpO2 100.00%. General: Awake, Oriented x3, No acute distress, some slurred speech HEENT: EOMI, Moist mucous membranes Neck: Supple CV: S1 and S2, bradycardic Lungs: Clear to ascultation bilaterally, no wheezing Abdomen: Soft, Nontender, Nondistended, +bowel sounds. Ext: Good pulses. Trace edema. No clubbing or cyanosis noted. Neuro: Cranial Nerves II-XII grossly intact. Has mild L sided weakness    Lab results:  Basename 06/09/11 1122  NA 142  K 4.1  CL 105  CO2 32  GLUCOSE 82  BUN 15  CREATININE 0.92  CALCIUM 9.5  MG --  PHOS --    Basename 06/09/11 1122  AST 17  ALT 10  ALKPHOS 59  BILITOT 0.3  PROT 6.2  ALBUMIN 3.6   No results found for this basename: LIPASE:2,AMYLASE:2 in the last 72 hours  Basename 06/09/11 1122  WBC 6.3  NEUTROABS 4.0  HGB 14.3  HCT 42.8  MCV 94.1  PLT 223   No results found for this basename: CKTOTAL:3,CKMB:3,CKMBINDEX:3,TROPONINI:3 in the last 72 hours No components found with this basename: POCBNP:3 No results found for this basename: DDIMER in the last 72 hours No results found for this basename: HGBA1C:2 in the last 72 hours No results found for this basename: CHOL:2,HDL:2,LDLCALC:2,TRIG:2,CHOLHDL:2,LDLDIRECT:2 in the last 72 hours No results found for this basename: TSH,T4TOTAL,FREET3,T3FREE,THYROIDAB in the last 72 hours No results found for this basename: VITAMINB12:2,FOLATE:2,FERRITIN:2,TIBC:2,IRON:2,RETICCTPCT:2 in the last 72 hours Imaging results:  Dg Chest 1 View  06/09/2011  *RADIOLOGY REPORT*  Clinical Data: 66 year old male with weakness.  CHEST - 1 VIEW  Comparison: 03/24/2011 and earlier.  Findings: AP semi  upright view 1152 hours.  Slightly lower lung volumes.  Cardiac size and mediastinal contours are within normal limits.  Visualized tracheal air column is within normal limits. Allowing for portable technique, the lungs are clear.  No pneumothorax or effusion.  Stable scoliosis.  IMPRESSION: No acute cardiopulmonary abnormality.  Original Report Authenticated By: Harley Hallmark, M.D.   Ct Head Wo Contrast  06/09/2011  *RADIOLOGY REPORT*  Clinical Data: Weakness and altered mental status  CT HEAD WITHOUT CONTRAST  Technique:  Contiguous axial images were obtained from the base of the skull through the vertex without contrast.  Comparison: 03/26/2011  Findings: There is diffuse patchy low density throughout the subcortical and periventricular white matter consistent with chronic small vessel ischemic change.  There is prominence of the sulci and ventricles consistent with brain atrophy.  Right frontal lobe calcification and adjacent encephalomalacia is again noted.  There is ex vacuo dilatation of the anterior horn of the right lateral ventricle.  There is no evidence for acute brain infarct, hemorrhage or mass.  The paranasal sinuses are clear.  The mastoid air cells are clear. The skull appears intact.  IMPRESSION:  1.  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 2.  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 3.  No acute intracranial abnormalities.  Original Report Authenticated By: Rosealee Albee, M.D.   Other results: EKG: bradycardic  Assessment & Plan by Problem:   *Slurred speech- MRI/MRA pending, carotids and echo done    CVA (cerebral infarction)   Seizures- keppra, seizure precuations   OSA (obstructive sleep apnea)- bipap at night   Glioma of brain- treated with chemo/radiation   Weakness generalized- check U/A, ? Seizure, PT eval  Bradycardia- monitor on tele   Time spent on admission, talking to the patient, and coordinating care was: 45  mins.  Jaggar Benko, DO 06/09/2011, 1:50 PM

## 2011-06-09 NOTE — ED Notes (Signed)
Patient is alert and oriented.  He denies any pain.  His complaint is weakness. He states he could not get up out of bed and had incontinence of urine.  Patient is on aggrenox,  CT of head has been ordered.  Patient with slurred speech,

## 2011-06-09 NOTE — Progress Notes (Signed)
Pt BP 94/71, neuro exam unchanged, pt denies any complaints.  NS at 75cc/hr started per previous MD orders.  MD on call paged.  Will continue to monitor.

## 2011-06-09 NOTE — Consult Note (Signed)
[facility] CONSULT NOTE     Reason for Consult: Slurred speech and generalized weakness    HPI:    Rodney Potts is an 66 y.o. 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 Exact onset of symptoms is unclear. Patient indicates that he has been very exhausted since he's got back from [place] yesterday. His wife indicated that his speech was more slurred than yesterday and patient feels generally weak. He has some residual left-sided weakness from his old tumor as well as surgery for knee replacement. Patient is not aware of any more weakness than usual involving his left side. He said no difficulty with swallowing. He's been taking Aggrenox daily for antiplatelet therapy. Patient also seizure disorder and has been on Keppra as well as phenobarbital. Said no changes in anticonvulsant medication doses. CT scan of his head showed no acute intracranial abnormality. Area of encephalomalacia and calcification was noted associated with his previous tumor and unchanged from January 2013. MRI and MRA of the brain were ordered and results are pending. NIH stroke score was 1 for dysarthria. Laboratory studies were unremarkable with no demonstrable metabolic abnormality. Patient was afebrile. Phenobarbital level is pending.  Past Medical History  Diagnosis Date  . Brain tumor     glioma  . Cancer     PROSTATE CANCER  . TIA (transient ischemic attack)     APPROX N2 MONTHS AGO 01/2011  . Sleep apnea   . IBS (irritable bowel syndrome)   . Shingles   . Cataract   . Seizures   . CVA (cerebral infarction)     R thalamic  . Stroke     Past Surgical History  Procedure Date  . Eye surgery     CATARACT REMOVAL  . Joint replacement     BILATERAL KNEE REPLACEMENT  . Prostatectomy   . Palate surgery   . Shoulder arthroscopy      No family history on file.  Social History:  reports that he has quit smoking. He does not have any smokeless tobacco history on file. He reports that he drinks about 3 ounces of alcohol per week. His drug history not on file.  Allergies  Allergen Reactions  . Amoxicillin Diarrhea  . Dilaudid (Hydromorphone Hcl) Other (See Comments)    DELUSIONS  . Lactose Intolerance (Gi) Diarrhea  . Sulfa Antibiotics Other (See Comments)    Cant control bladder    Medications:    Prior to Admission:  Prescriptions prior to admission  Medication Sig Dispense Refill  . calcium-vitamin D (OSCAL WITH D) 500-200 MG-UNIT per tablet Take 1 tablet by mouth daily.      . Coenzyme Q10 (CO Q 10 PO) Take 1 tablet by mouth daily.      Marland Kitchen dipyridamole-aspirin (AGGRENOX) 25-200 MG per 12 hr capsule Take 1 capsule by mouth 2 (two) times daily. Take 1 capsule by mouth at bedtime daily until 04/08/2011. From 04/09/2011, take 1 capsule by mouth 2 times daily.      Marland Kitchen levETIRAcetam (KEPPRA) 1000 MG tablet Take 500-1,000 mg by mouth 2 (two) times daily. 500 mg in the am and 1000 mg at bedtime.      . Multiple Vitamin (MULITIVITAMIN WITH MINERALS) TABS Take 1 tablet by mouth daily.      Marland Kitchen  PHENobarbital (LUMINAL) 97.2 MG tablet Take 194.4 mg by mouth at bedtime.       . solifenacin (VESICARE) 10 MG tablet Take 10 mg by mouth daily.      Marland Kitchen atorvastatin (LIPITOR) 40 MG tablet Take 40 mg by mouth daily.        Blood pressure 94/72, pulse 48, temperature 98 F (36.7 C), temperature source Oral, resp. rate 11, SpO2 100.00%.  Neurologic Examination:  Mental Status: Alert, oriented, thought content appropriate.  Speech dysarthric without evidence of aphasia. Able to follow commands without difficulty. Cranial Nerves: II-Visual fields were normal. III/IV/VI-Pupils were equal and reacted. Extraocular movements were full and conjugate.    V/VII-no facial numbness and no facial weakness. VIII-normal. X-normal speech and  symmetrical palatal movement. XII-midline tongue extension Motor: 5/5 bilaterally with normal tone and bulk Sensory: Normal throughout. Deep Tendon Reflexes: Hypoactive throughout and symmetric Plantars: Mute bilaterally Cerebellar: Normal finger-to-nose testing. Carotid auscultation: Normal   Dg Chest 1 View  06/09/2011  *RADIOLOGY REPORT*  Clinical Data: 66 year old male with weakness.  CHEST - 1 VIEW  Comparison: 03/24/2011 and earlier.  Findings: AP semi upright view 1152 hours.  Slightly lower lung volumes.  Cardiac size and mediastinal contours are within normal limits.  Visualized tracheal air column is within normal limits. Allowing for portable technique, the lungs are clear.  No pneumothorax or effusion.  Stable scoliosis.  IMPRESSION: No acute cardiopulmonary abnormality.  Original Report Authenticated By: Harley Hallmark, M.D.   Ct Head Wo Contrast  06/09/2011  *RADIOLOGY REPORT*  Clinical Data: Weakness and altered mental status  CT HEAD WITHOUT CONTRAST  Technique:  Contiguous axial images were obtained from the base of the skull through the vertex without contrast.  Comparison: 03/26/2011  Findings: There is diffuse patchy low density throughout the subcortical and periventricular white matter consistent with chronic small vessel ischemic change.  There is prominence of the sulci and ventricles consistent with brain atrophy.  Right frontal lobe calcification and adjacent encephalomalacia is again noted.  There is ex vacuo dilatation of the anterior horn of the right lateral ventricle.  There is no evidence for acute brain infarct, hemorrhage or mass.  The paranasal sinuses are clear.  The mastoid air cells are clear. The skull appears intact.  IMPRESSION:  1.  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 2.  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 3.  No acute intracranial abnormalities.  Original Report Authenticated By: Rosealee Albee, M.D.    Assessment/Plan:  66 year old man with history of brain tumor treated with radiation therapy and chemotherapy, as well as small subcortical right CVA in January 2013, presenting with slurred speech and feeling of generalized weakness. Patient does not clinically show any indications of acute stroke. Etiology for this artery is unclear. Stroke cannot be ruled out at this point however.  Recommendations: 1. Agree with obtaining MRI of the brain to rule out acute stroke. 2. Stroke workup to include cerebral vascular studies if MRI shows recurrent acute stroke. 3. Speech therapy consult 4. Phenobarbital level to rule out phenobarbital toxicity as a contributing factor to the patient's dysarthria and feelings of generalized weakness. 5. Continue Aggrenox for antiplatelet therapy twice a day.  Venetia Maxon M.D. [facility] [phone]  06/09/2011, 3:37 PM

## 2011-06-09 NOTE — ED Provider Notes (Signed)
History     CSN: [id number]  Arrival date & time 06/09/11  4098   First MD Initiated Contact with Patient 06/09/11 1106      Chief Complaint  Patient presents with  . Weakness  . Altered Mental Status  . Bradycardia    (Consider location/radiation/quality/duration/timing/severity/associated sxs/prior treatment) Patient is a 66 y.o. male presenting with weakness. The history is provided by the patient. No language interpreter was used.  Weakness The primary symptoms include speech change. Primary symptoms do not include headaches, syncope, loss of consciousness, altered mental status, seizures, dizziness, paresthesias, focal weakness, loss of sensation, fever, nausea or vomiting. The symptoms began yesterday. The symptoms are worsening. The neurological symptoms are multifocal. Context: on awakening.  Additional symptoms include weakness and leg pain. Additional symptoms do not include neck stiffness or lower back pain.    Past Medical History  Diagnosis Date  . Brain tumor   . Cancer     PROSTATE CANCER  . TIA (transient ischemic attack)     APPROX N2 MONTHS AGO 01/2011  . Sleep apnea   . IBS (irritable bowel syndrome)   . Shingles   . Cataract   . Seizures     Past Surgical History  Procedure Date  . Eye surgery     CATARACT REMOVAL  . Joint replacement     BILATERAL KNEE REPLACEMENT  . Prostatectomy   . Palate surgery   . Shoulder arthroscopy     No family history on file.  History  Substance Use Topics  . Smoking status: Former Games developer  . Smokeless tobacco: Not on file  . Alcohol Use: 3.0 oz/week    5 Glasses of wine per week      Review of Systems  Constitutional: Positive for activity change, appetite change and fatigue. Negative for fever and chills.  HENT: Negative for congestion, sore throat, rhinorrhea, neck pain and neck stiffness.   Respiratory: Negative for cough and shortness of breath.   Cardiovascular: Negative for chest pain, palpitations  and syncope.  Gastrointestinal: Positive for diarrhea. Negative for nausea, vomiting and abdominal pain.  Genitourinary: Negative for dysuria, urgency, frequency and flank pain.  Musculoskeletal: Positive for gait problem. Negative for myalgias, back pain and arthralgias.  Neurological: Positive for speech change, weakness and numbness. Negative for dizziness, focal weakness, seizures, loss of consciousness, light-headedness, headaches and paresthesias.  Psychiatric/Behavioral: Negative for altered mental status.  All other systems reviewed and are negative.    Allergies  Amoxicillin; Dilaudid; Lactose intolerance (gi); and Sulfa antibiotics  Home Medications   Current Outpatient Rx  Name Route Sig Dispense Refill  . CALCIUM CARBONATE-VITAMIN D 500-200 MG-UNIT PO TABS Oral Take 1 tablet by mouth daily.    . CO Q 10 PO Oral Take 1 tablet by mouth daily.    . ASPIRIN-DIPYRIDAMOLE ER 25-200 MG PO CP12 Oral Take 1 capsule by mouth 2 (two) times daily. Take 1 capsule by mouth at bedtime daily until 04/08/2011. From 04/09/2011, take 1 capsule by mouth 2 times daily.    Marland Kitchen LEVETIRACETAM 1000 MG PO TABS Oral Take 500-1,000 mg by mouth 2 (two) times daily. 500 mg in the am and 1000 mg at bedtime.    . ADULT MULTIVITAMIN W/MINERALS CH Oral Take 1 tablet by mouth daily.    Marland Kitchen PHENOBARBITAL 97.2 MG PO TABS Oral Take 194.4 mg by mouth at bedtime.     . SOLIFENACIN SUCCINATE 10 MG PO TABS Oral Take 10 mg by mouth daily.    Marland Kitchen  ATORVASTATIN CALCIUM 40 MG PO TABS Oral Take 40 mg by mouth daily.      BP 123/85  Pulse 41  Temp(Src) 98 F (36.7 C) (Oral)  Resp 20  SpO2 100%  Physical Exam  Nursing note and vitals reviewed. Constitutional: He is oriented to person, place, and time. He appears well-developed and well-nourished. No distress.       Slurred and slowed speech  HENT:  Head: Normocephalic and atraumatic.  Mouth/Throat: Oropharynx is clear and moist.  Eyes: Conjunctivae and EOM are normal.  Pupils are equal, round, and reactive to light.  Neck: Normal range of motion. Neck supple.  Cardiovascular: Regular rhythm, normal heart sounds and intact distal pulses.  Exam reveals no gallop and no friction rub.   No murmur heard.      Bradycardic rate  Pulmonary/Chest: Effort normal and breath sounds normal. No respiratory distress. He exhibits no tenderness.  Abdominal: Soft. Bowel sounds are normal. There is no tenderness. There is no rebound and no guarding.  Neurological: He is alert and oriented to person, place, and time. He has normal strength and normal reflexes. No cranial nerve deficit or sensory deficit.       Slurred speech  Skin: Skin is warm and dry. No rash noted.    ED Course  Procedures (including critical care time)   Date: 06/09/2011  Rate: 51  Rhythm: sinus bradycardia  QRS Axis: left  Intervals: normal  ST/T Wave abnormalities: normal  Conduction Disutrbances:none  Narrative Interpretation:   Old EKG Reviewed: changes noted  Labs Reviewed  COMPREHENSIVE METABOLIC PANEL - Abnormal; Notable for the following:    GFR calc non Af Amer 87 (*)    All other components within normal limits  CBC  DIFFERENTIAL  APTT  PROTIME-INR  POCT I-STAT TROPONIN I  URINALYSIS, ROUTINE W REFLEX MICROSCOPIC  URINE CULTURE  LACTIC ACID, PLASMA   Dg Chest 1 View  06/09/2011  *RADIOLOGY REPORT*  Clinical Data: 66 year old male with weakness.  CHEST - 1 VIEW  Comparison: 03/24/2011 and earlier.  Findings: AP semi upright view 1152 hours.  Slightly lower lung volumes.  Cardiac size and mediastinal contours are within normal limits.  Visualized tracheal air column is within normal limits. Allowing for portable technique, the lungs are clear.  No pneumothorax or effusion.  Stable scoliosis.  IMPRESSION: No acute cardiopulmonary abnormality.  Original Report Authenticated By: Harley Hallmark, M.D.   Ct Head Wo Contrast  06/09/2011  *RADIOLOGY REPORT*  Clinical Data: Weakness and  altered mental status  CT HEAD WITHOUT CONTRAST  Technique:  Contiguous axial images were obtained from the base of the skull through the vertex without contrast.  Comparison: 03/26/2011  Findings: There is diffuse patchy low density throughout the subcortical and periventricular white matter consistent with chronic small vessel ischemic change.  There is prominence of the sulci and ventricles consistent with brain atrophy.  Right frontal lobe calcification and adjacent encephalomalacia is again noted.  There is ex vacuo dilatation of the anterior horn of the right lateral ventricle.  There is no evidence for acute brain infarct, hemorrhage or mass.  The paranasal sinuses are clear.  The mastoid air cells are clear. The skull appears intact.  IMPRESSION:  1.  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 2.  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 3.  No acute intracranial abnormalities.  Original Report Authenticated By: Rosealee Albee, M.D.     1. Weakness generalized   2. Bradycardia  MDM  Generalized weakness. CT scan of the head was negative. He received aspirin. He is unable to ambulate therefore he warrants admission to the hospital. remainder of his workup was relatively unremarkable. MRI of the rain and MRAs were ordered. I discussed the case the [facility] who accepted the patient for dimension. He received IV fluids and one dose of atropine. He is not to be bradycardic. On review of his medications I do not see a beta blocker. There is no evidence of heart block.        Dayton Bailiff, MD 06/09/11 1315

## 2011-06-09 NOTE — ED Notes (Signed)
Pt placed on cardiac monitor,cont. Pulse ox. o2 @2lt .  cont. bp.

## 2011-06-09 NOTE — Progress Notes (Signed)
CRITICAL VALUE ALERT  Critical value received:  Phenobarbital level of 51.5  Date of notification:  06/09/2011  Time of notification:  1605  Critical value read back:yes  Nurse who received alert:  Yeimy Brabant L. Solon Alban RN,BSN  MD notified (1st page):  Benjamine Mola, j  Time of first page:  16:11  MD notified (2nd page):  Time of second page:  Responding MD:  vann  Time MD responded:  [phone]

## 2011-06-09 NOTE — Progress Notes (Signed)
Per MD order 250cc saline bolus given with bp 110/90.

## 2011-06-09 NOTE — Evaluation (Signed)
Physical Therapy Evaluation Patient Details Name: Rodney Potts MRN: [id number] DOB: [birth year] Today's Date: 06/09/2011  Problem List:  Patient Active Problem List  Diagnoses  . CVA (cerebral infarction)  . Seizures  . OSA (obstructive sleep apnea)  . IBS (irritable bowel syndrome)  . Glioma of brain  . Slurred speech  . Weakness generalized    Past Medical History:  Past Medical History  Diagnosis Date  . Brain tumor     glioma  . Cancer     PROSTATE CANCER  . TIA (transient ischemic attack)     APPROX N2 MONTHS AGO 01/2011  . Sleep apnea   . IBS (irritable bowel syndrome)   . Shingles   . Cataract   . Seizures   . CVA (cerebral infarction)     R thalamic  . Stroke    Past Surgical History:  Past Surgical History  Procedure Date  . Eye surgery     CATARACT REMOVAL  . Joint replacement     BILATERAL KNEE REPLACEMENT  . Prostatectomy   . Palate surgery   . Shoulder arthroscopy     PT Assessment/Plan/Recommendation PT Assessment Clinical Impression Statement: 66 year old admitted for slurred speech, weakness. Found to have bradycardia and very high Phenobarbital levels, MRI pending. Pt presents with generalized weakness and decreased safety awareness, awareness of deficits, and slight impulsiveness which all increase his risk for falls. Pt familiar to me from previous admission, completed bout of outpatient PT and was able to ambulate with cane independently. He now needs a walker and min assist for safety. Wife works, gone ~9 hours a day. Discussed need for more care in the home, [facility] & HHPT may be needed. Hopefully some of pt's cognitive symptoms will clear with decreased levels of Phenobarbital, if they do not pt may need more extensive rehab for safety given decreased support at home.  PT Recommendation/Assessment: Patient will need skilled PT in the acute care venue PT Problem List: Decreased strength;Decreased activity tolerance;Decreased balance;Decreased  mobility;Decreased cognition;Decreased knowledge of use of DME;Decreased safety awareness;Decreased knowledge of precautions Barriers to Discharge: Decreased caregiver support PT Therapy Diagnosis : Difficulty walking;Abnormality of gait;Generalized weakness;Altered mental status PT Plan PT Frequency: Min 4X/week PT Treatment/Interventions: DME instruction;Gait training;Stair training;Functional mobility training;Therapeutic activities;Therapeutic exercise;Balance training;Neuromuscular re-education;Patient/family education;Cognitive remediation PT Recommendation Follow Up Recommendations: Home health PT;Skilled nursing facility;Supervision for mobility/OOB Equipment Recommended: Other (comment) (to be determined) PT Goals  Acute Rehab PT Goals PT Goal Formulation: With patient Time For Goal Achievement: 2 weeks Pt will go Supine/Side to Sit: with modified independence PT Goal: Supine/Side to Sit - Progress: Goal set today Pt will go Sit to Stand: with modified independence PT Goal: Sit to Stand - Progress: Goal set today Pt will go Stand to Sit: with modified independence PT Goal: Stand to Sit - Progress: Goal set today Pt will Ambulate: >150 feet;with modified independence;with least restrictive assistive device PT Goal: Ambulate - Progress: Goal set today Pt will Go Up / Down Stairs: 3-5 stairs;with modified independence;with least restrictive assistive device PT Goal: Up/Down Stairs - Progress: Goal set today  PT Evaluation Precautions/Restrictions  Precautions Precautions: Fall Precaution Comments: h/o falls, weakness, confusion Required Braces or Orthoses: No Restrictions Weight Bearing Restrictions: No Prior Functioning  Home Living Lives With: Spouse Type of Home: House Home Layout: Two level;Able to live on main level with bedroom/bathroom Alternate Level Stairs-Rails: Left Alternate Level Stairs-Number of Steps: 12 Home Access: Stairs to enter Entrance Stairs-Rails:  Right Entrance Stairs-Number of  Steps: 3 Bathroom Shower/Tub: Tub/shower unit;Walk-in shower Bathroom Toilet: Standard Bathroom Accessibility: Yes Home Adaptive Equipment: Straight cane Prior Function Level of Independence: Independent with basic ADLs;Requires assistive device for independence Driving: Yes Vocation: Retired Comments: Pt and wife report pt was to level of ambulating with cane and sometimes without cane. Pt was able to carry wood into house, up steps.  Cognition Cognition Arousal/Alertness: Awake/alert Overall Cognitive Status: Impaired Memory: Appears impaired Memory Deficits: Slightly impaired Orientation Level: Oriented X4 Safety/Judgement: Decreased awareness of safety precautions Decreased Safety/Judgement: Decreased awareness of need for assistance Awareness of Errors: Decreased awareness of errors made Problem Solving: Requires assistance for problem solving Sensation/Coordination Sensation Light Touch: Impaired by gross assessment (from prior stroke, not assessed in full detail.) Coordination Gross Motor Movements are Fluid and Coordinated: No Coordination and Movement Description: Impaired coordination of Lt. side, Lt. LE more impaired than Lt UE.  Extremity Assessment RLE Assessment RLE Assessment: Exceptions to [facility] RLE AROM (degrees) Overall AROM Right Lower Extremity: Within functional limits for tasks assessed RLE Strength RLE Overall Strength: Deficits RLE Overall Strength Comments: Generalized weakness, grossly >/= 4-/5 LLE Assessment LLE Assessment: Exceptions to WFL LLE AROM (degrees) Overall AROM Left Lower Extremity: Deficits (tone limits ROM secondary prior CVA) LLE Strength LLE Overall Strength: Deficits LLE Overall Strength Comments: Overal pt able to tolerate functional weightbearing, grossly >/= 3+/5. Lt. LE fatigues much faster than Rt. LE.  Mobility (including Balance) Bed Mobility Bed Mobility: Yes Supine to Sit: 4: Min  assist Supine to Sit Details (indicate cue type and reason): RN helping pt, pt likely able to perform by self. Pt eager to get OOB Transfers Transfers: Yes Sit to Stand: Other (comment) (min-guard assist) Sit to Stand Details (indicate cue type and reason): Verbal cues for UE placement, pt pulling up on RW instead of safe placement on bed. Stand to Sit: Other (comment) (min-guard assist) Stand to Sit Details: Verbal cues for squaring up with chair prior to sitting, verbal cues for UE placement. Pt initially with RW very far anterior to body, cues to correct. Min-guard assist for safety.  Ambulation/Gait Ambulation/Gait: Yes Ambulation/Gait Assistance: 4: Min assist Ambulation/Gait Assistance Details (indicate cue type and reason): Assist primarily for negotiation of RW and stabilization secondary to increased lateral sway. Pt allows RW to get very far ahead of body and requires cues to correct, also tends to get feet outside of base of walker increasing falls risk. Decreased problem solving in tight spaces such as bathroom noted.  Ambulation Distance (Feet): 130 Feet Assistive device: Rolling walker Gait Pattern: Step-through pattern;Decreased step length - left;Decreased stance time - left;Decreased hip/knee flexion - left (Decreased foot clearance bilaterally, Lt > impaired than Rt) Stairs: No  Posture/Postural Control Posture/Postural Control: No significant limitations Balance Balance Assessed: Yes Dynamic Standing Balance Dynamic Standing - Balance Support: Bilateral upper extremity supported;During functional activity Dynamic Standing - Level of Assistance: 4: Min assist Dynamic Standing - Comments: Pt with decreased safety awareness with use of RW, particularly in bathroom, increased imbalance. needs assist for safety. End of Session PT - End of Session Equipment Utilized During Treatment: Gait belt Activity Tolerance: Patient tolerated treatment well Patient left: in chair;with  call bell in reach;with family/visitor present Nurse Communication: Mobility status for ambulation;Other (comment) (need to be in bed with alarm if family leaves) General Behavior During Session: [facility] for tasks performed Cognition: Impaired Cognitive Impairment: Pt with decreased safety awareness, decreased ability to problem solve increasing his risk for falls.   Wilhemina Bonito  06/09/2011, 6:04 PM  Sherie Don) Carleene Mains PT, DPT Acute Rehabilitation [phone]

## 2011-06-10 LAB — LIPID PANEL
Cholesterol: 130 mg/dL (ref 0–200)
Total CHOL/HDL Ratio: 2.4 RATIO
VLDL: 11 mg/dL (ref 0–40)

## 2011-06-10 LAB — CBC
MCH: 31 pg (ref 26.0–34.0)
MCHC: 33.1 g/dL (ref 30.0–36.0)
MCV: 93.7 fL (ref 78.0–100.0)
Platelets: 220 10*3/uL (ref 150–400)
RBC: 4.1 MIL/uL — ABNORMAL LOW (ref 4.22–5.81)
RDW: 14.5 % (ref 11.5–15.5)

## 2011-06-10 LAB — CARDIAC PANEL(CRET KIN+CKTOT+MB+TROPI)
Relative Index: 4.4 — ABNORMAL HIGH (ref 0.0–2.5)
Total CK: 146 U/L (ref 7–232)

## 2011-06-10 LAB — GLUCOSE, CAPILLARY

## 2011-06-10 LAB — BASIC METABOLIC PANEL
CO2: 29 mEq/L (ref 19–32)
Calcium: 8.8 mg/dL (ref 8.4–10.5)
Creatinine, Ser: 0.87 mg/dL (ref 0.50–1.35)

## 2011-06-10 LAB — HEMOGLOBIN A1C: Hgb A1c MFr Bld: 5.5 % (ref <5.7)

## 2011-06-10 NOTE — Progress Notes (Signed)
Subjective: To improve speech was less dysarthria. Confusion has improved as well as level of energy overall. Phenobarbital toxicity as primary etiology for patient's new symptomatology with a level of 51.5 on admission.  Objective: Current vital signs: BP 101/74  Pulse 53  Temp(Src) 97.6 F (36.4 C) (Oral)  Resp 16  Ht 5' 9.5" (1.765 m)  Wt 81.194 kg (179 lb)  BMI 26.05 kg/m2  SpO2 98%  Neurologic Exam: Alert with no complaints. Patient is oriented well to time person and place. Dysarthria has improved. No focal motor deficits were noted. Coordination was normal.  Lab Results: Phenobarbital level today was 44.3 (normal level = 15.0-40.0)  Studies/Results: Dg Chest 1 View  06/09/2011  *RADIOLOGY REPORT*  Clinical Data: 66 year old male with weakness.  CHEST - 1 VIEW  Comparison: 03/24/2011 and earlier.  Findings: AP semi upright view 1152 hours.  Slightly lower lung volumes.  Cardiac size and mediastinal contours are within normal limits.  Visualized tracheal air column is within normal limits. Allowing for portable technique, the lungs are clear.  No pneumothorax or effusion.  Stable scoliosis.  IMPRESSION: No acute cardiopulmonary abnormality.  Original Report Authenticated By: Harley Hallmark, M.D.   Ct Head Wo Contrast  06/09/2011  *RADIOLOGY REPORT*  Clinical Data: Weakness and altered mental status  CT HEAD WITHOUT CONTRAST  Technique:  Contiguous axial images were obtained from the base of the skull through the vertex without contrast.  Comparison: 03/26/2011  Findings: There is diffuse patchy low density throughout the subcortical and periventricular white matter consistent with chronic small vessel ischemic change.  There is prominence of the sulci and ventricles consistent with brain atrophy.  Right frontal lobe calcification and adjacent encephalomalacia is again noted.  There is ex vacuo dilatation of the anterior horn of the right lateral ventricle.  There is no evidence for  acute brain infarct, hemorrhage or mass.  The paranasal sinuses are clear.  The mastoid air cells are clear. The skull appears intact.  IMPRESSION:  1.  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 2.  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 3.  No acute intracranial abnormalities.  Original Report Authenticated By: Rosealee Albee, M.D.   Mr Angiogram Head Wo Contrast  06/09/2011  *RADIOLOGY REPORT*  Clinical Data:  New onset gait instability and slurred speech and weakness.  History of brain tumor with radiation and chemotherapy.  MRI HEAD WITHOUT AND WITH CONTRAST MRA HEAD WITHOUT CONTRAST  Technique:  Multiplanar, multiecho pulse sequences of the brain and surrounding structures were obtained without and with intravenous contrast.  Angiographic images of the head were obtained using MRA technique without contrast.  Contrast: 15mL MULTIHANCE GADOBENATE DIMEGLUMINE 529 MG/ML IV SOLN  Comparison:  CT 06/09/2011, MRI 03/24/2011  MRI HEAD WITHOUT AND WITH CONTRAST  Findings:  Generalized atrophy.  Encephalomalacia in the right frontal temporal lobe is unchanged and does  not show abnormal enhancement.  There is a coarse calcification in this area on CT. No abnormal enhancement.  This may be a site of prior tumor treatment without recurrence.  No evidence of craniotomy is identified.  Chronic microvascular ischemia is present throughout the white matter.  Chronic infarct right thalamus.  Negative for acute infarct.  Numerous areas of chronic micro hemorrhage is seen throughout the brain. This may be related to prior radiation to the head.  Chronic hypertension and cerebral amyloid can also cause this.  Postcontrast images the brain reveal normal enhancement.  No mass lesion is present.  IMPRESSION: Atrophy and chronic microvascular ischemic changes. Encephalomalacia in the right frontal temporal lobe may be related to prior tumor treatment.  Extensive chronic micro hemorrhage  throughout the brain may be related prior cranial radiation.  Negative for acute infarct.  MRA HEAD  Findings: Both vertebral arteries are patent to the basilar. Basilar is widely patent.  Superior cerebellar arteries are patent. 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  Left posterior cerebral artery has a mild stenosis in the midportion.  Internal carotid artery is patent bilaterally without stenosis. The anterior and middle cerebral arteries are patent bilaterally without significant stenosis.  Negative for cerebral aneurysm.  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  Mild stenosis left posterior cerebral artery.  *RADIOLOGY REPORT*  Clinical Data:  New onset gait instability and slurred speech. Rule out TIA.  MRA NECK WITHOUT AND WITH CONTRAST  Technique:  Angiographic images of the neck were obtained using MRA technique without and with intravenous contrast.  Carotid stenosis measurements (when applicable) are obtained utilizing NASCET criteria, using the distal internal carotid diameter as the denominator.  Contrast: 15mL MULTIHANCE GADOBENATE DIMEGLUMINE 529 MG/ML IV SOLN  Comparison:   None.  Findings:  Suboptimal study due to timing of the injection.  There is suboptimal contrast opacification. The arteries are better visualized on the delayed venous images.  Both vertebral arteries are widely patent without stenosis.  Both carotid arteries are widely patent without stenosis.  IMPRESSION: No significant carotid or vertebral artery stenosis.  Suboptimal study for technical reasons.  Original Report Authenticated By: Camelia Phenes, M.D.   Mr Angiogram Neck W Wo Contrast  06/09/2011  *RADIOLOGY REPORT*  Clinical Data:  New onset gait instability and slurred speech and weakness.  History of brain tumor with radiation and chemotherapy.  MRI HEAD WITHOUT AND WITH CONTRAST MRA HEAD WITHOUT CONTRAST  Technique:  Multiplanar,  multiecho pulse sequences of the brain and surrounding structures were obtained without and with intravenous contrast.  Angiographic images of the head were obtained using MRA technique without contrast.  Contrast: 15mL MULTIHANCE GADOBENATE DIMEGLUMINE 529 MG/ML IV SOLN  Comparison:  CT 06/09/2011, MRI 03/24/2011  MRI HEAD WITHOUT AND WITH CONTRAST  Findings:  Generalized atrophy.  Encephalomalacia in the right frontal temporal lobe is unchanged and does  not show abnormal enhancement.  There is a coarse calcification in this area on CT. No abnormal enhancement.  This may be a site of prior tumor treatment without recurrence.  No evidence of craniotomy is identified.  Chronic microvascular ischemia is present throughout the white matter.  Chronic infarct right thalamus.  Negative for acute infarct.  Numerous areas of chronic micro hemorrhage is seen throughout the brain. This may be related to prior radiation to the head.  Chronic hypertension and cerebral amyloid can also cause this.  Postcontrast images the brain reveal normal enhancement.  No mass lesion is present.  IMPRESSION: Atrophy and chronic microvascular ischemic changes. Encephalomalacia in the right frontal temporal lobe may be related to prior tumor treatment.  Extensive chronic micro hemorrhage throughout the brain may be related prior cranial radiation.  Negative for acute infarct.  MRA HEAD  Findings: Both vertebral arteries are patent to the basilar. Basilar is widely patent.  Superior cerebellar arteries are patent. 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  Left posterior cerebral artery has a mild stenosis in the midportion.  Internal carotid artery is patent bilaterally without stenosis.  The anterior and middle cerebral arteries are patent bilaterally without significant stenosis.  Negative for cerebral aneurysm.  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  Mild  stenosis left posterior cerebral artery.  *RADIOLOGY REPORT*  Clinical Data:  New onset gait instability and slurred speech. Rule out TIA.  MRA NECK WITHOUT AND WITH CONTRAST  Technique:  Angiographic images of the neck were obtained using MRA technique without and with intravenous contrast.  Carotid stenosis measurements (when applicable) are obtained utilizing NASCET criteria, using the distal internal carotid diameter as the denominator.  Contrast: 15mL MULTIHANCE GADOBENATE DIMEGLUMINE 529 MG/ML IV SOLN  Comparison:   None.  Findings:  Suboptimal study due to timing of the injection.  There is suboptimal contrast opacification. The arteries are better visualized on the delayed venous images.  Both vertebral arteries are widely patent without stenosis.  Both carotid arteries are widely patent without stenosis.  IMPRESSION: No significant carotid or vertebral artery stenosis.  Suboptimal study for technical reasons.  Original Report Authenticated By: Camelia Phenes, M.D.   Mr Laqueta Jean Wo Contrast  06/09/2011  *RADIOLOGY REPORT*  Clinical Data:  New onset gait instability and slurred speech and weakness.  History of brain tumor with radiation and chemotherapy.  MRI HEAD WITHOUT AND WITH CONTRAST MRA HEAD WITHOUT CONTRAST  Technique:  Multiplanar, multiecho pulse sequences of the brain and surrounding structures were obtained without and with intravenous contrast.  Angiographic images of the head were obtained using MRA technique without contrast.  Contrast: 15mL MULTIHANCE GADOBENATE DIMEGLUMINE 529 MG/ML IV SOLN  Comparison:  CT 06/09/2011, MRI 03/24/2011  MRI HEAD WITHOUT AND WITH CONTRAST  Findings:  Generalized atrophy.  Encephalomalacia in the right frontal temporal lobe is unchanged and does  not show abnormal enhancement.  There is a coarse calcification in this area on CT. No abnormal enhancement.  This may be a site of prior tumor treatment without recurrence.  No evidence of craniotomy is identified.   Chronic microvascular ischemia is present throughout the white matter.  Chronic infarct right thalamus.  Negative for acute infarct.  Numerous areas of chronic micro hemorrhage is seen throughout the brain. This may be related to prior radiation to the head.  Chronic hypertension and cerebral amyloid can also cause this.  Postcontrast images the brain reveal normal enhancement.  No mass lesion is present.  IMPRESSION: Atrophy and chronic microvascular ischemic changes. Encephalomalacia in the right frontal temporal lobe may be related to prior tumor treatment.  Extensive chronic micro hemorrhage throughout the brain may be related prior cranial radiation.  Negative for acute infarct.  MRA HEAD  Findings: Both vertebral arteries are patent to the basilar. Basilar is widely patent.  Superior cerebellar arteries are patent. 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  Left posterior cerebral artery has a mild stenosis in the midportion.  Internal carotid artery is patent bilaterally without stenosis. The anterior and middle cerebral arteries are patent bilaterally without significant stenosis.  Negative for cerebral aneurysm.  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  Mild stenosis left posterior cerebral artery.  *RADIOLOGY REPORT*  Clinical Data:  New onset gait instability and slurred speech. Rule out TIA.  MRA NECK WITHOUT AND WITH CONTRAST  Technique:  Angiographic images of the neck were obtained using MRA technique without and with intravenous contrast.  Carotid stenosis measurements (when applicable) are obtained utilizing NASCET criteria, using the distal internal carotid diameter  as the denominator.  Contrast: 15mL MULTIHANCE GADOBENATE DIMEGLUMINE 529 MG/ML IV SOLN  Comparison:   None.  Findings:  Suboptimal study due to timing of the injection.  There is suboptimal contrast opacification. The arteries are better visualized on  the delayed venous images.  Both vertebral arteries are widely patent without stenosis.  Both carotid arteries are widely patent without stenosis.  IMPRESSION: No significant carotid or vertebral artery stenosis.  Suboptimal study for technical reasons.  Original Report Authenticated By: Camelia Phenes, M.D.    Medications:  Scheduled:   . sodium chloride   Intravenous STAT  . aspirin  324 mg Oral Once  . atorvastatin  40 mg Oral q1800  . atropine      . atropine  0.5 mg Intravenous Once  . atropine  0.5 mg Intravenous Once  . calcium-vitamin D  1 tablet Oral Daily  . darifenacin  7.5 mg Oral Daily  . dipyridamole-aspirin  1 capsule Oral BID  . levETIRAcetam  1,000 mg Oral QHS  . levETIRAcetam  500 mg Oral Daily  . mulitivitamin with minerals  1 tablet Oral Daily  . sodium chloride  250 mL Intravenous Once  . sodium chloride  3 mL Intravenous Q12H  . DISCONTD: levETIRAcetam  500-1,000 mg Oral BID  . DISCONTD: PHENobarbital  194.4 mg Oral QHS    Assessment/Plan: 1. Phenobarbital toxicity, resolving. Patient's blood level phenobarbital is still subtherapeutic however. 2. We'll continue to with goal phenobarbital until this level has returned to within normal therapeutic range. 3. Recommend resuming phenobarbital 97.2 mg at one and a half tablets each bedtime, instead of 2 tablets, once phenobarbital level has returned to normal therapeutic range. 4. Continued physical therapy intervention and recommendations for gait abnormality. 5. No further stroke workup is indicated.  C.R. Roseanne Reno, MD [facility]  06/10/2011  9:51 AM

## 2011-06-10 NOTE — Progress Notes (Signed)
Pt BP 94/50 with HR 50, pt remains asymptomatic.  MD notified with no new orders at present.  Will continue to monitor.

## 2011-06-10 NOTE — Progress Notes (Signed)
Subjective: Patient feeling better, no new c/o overnight No fever, no chills, speech better Weakness better   Objective: Vital signs in last 24 hours: Filed Vitals:   06/10/11 0200 06/10/11 0400 06/10/11 0551 06/10/11 0600  BP: 91/64 91/63  101/74  Pulse: 47 48  53  Temp:   97.6 F (36.4 C)   TempSrc:      Resp:   16   Height:      Weight:      SpO2:   98%    Weight change:   Intake/Output Summary (Last 24 hours) at 06/10/11 0823 Last data filed at 06/10/11 0700  Gross per 24 hour  Intake   1243 ml  Output    425 ml  Net    818 ml    Physical Exam: General: Awake, Oriented, No acute distress. HEENT: EOMI. Neck: Supple CV: S1 and S2 Lungs: Clear to ascultation bilaterally Abdomen: Soft, Nontender, Nondistended, +bowel sounds. Ext: Good pulses. Trace edema.  Lab Results:  [facility] 06/10/11 0007 06/09/11 1122  NA 139 142  K 3.8 4.1  CL 103 105  CO2 29 32  GLUCOSE 74 82  BUN 16 15  CREATININE 0.87 0.92  CALCIUM 8.8 9.5  MG -- --  PHOS -- --    Basename 06/09/11 1122  AST 17  ALT 10  ALKPHOS 59  BILITOT 0.3  PROT 6.2  ALBUMIN 3.6   No results found for this basename: LIPASE:2,AMYLASE:2 in the last 72 hours  Basename 06/10/11 0007 06/09/11 1122  WBC 6.1 6.3  NEUTROABS -- 4.0  HGB 12.7* 14.3  HCT 38.4* 42.8  MCV 93.7 94.1  PLT 220 223    Basename 06/10/11 0007 06/09/11 1650  CKTOTAL 173 236*  CKMB 7.6* 10.8*  CKMBINDEX -- --  TROPONINI <0.30 <0.30   No components found with this basename: POCBNP:3 No results found for this basename: DDIMER:2 in the last 72 hours No results found for this basename: HGBA1C:2 in the last 72 hours  Basename 06/10/11 0007  CHOL 130  HDL 55  LDLCALC 64  TRIG 53  CHOLHDL 2.4  LDLDIRECT --   No results found for this basename: TSH,T4TOTAL,FREET3,T3FREE,THYROIDAB in the last 72 hours No results found for this basename: VITAMINB12:2,FOLATE:2,FERRITIN:2,TIBC:2,IRON:2,RETICCTPCT:2 in the last 72 hours  Micro  Results: No results found for this or any previous visit (from the past 240 hour(s)).  Studies/Results: Dg Chest 1 View  06/09/2011  *RADIOLOGY REPORT*  Clinical Data: 66 year old male with weakness.  CHEST - 1 VIEW  Comparison: 03/24/2011 and earlier.  Findings: AP semi upright view 1152 hours.  Slightly lower lung volumes.  Cardiac size and mediastinal contours are within normal limits.  Visualized tracheal air column is within normal limits. Allowing for portable technique, the lungs are clear.  No pneumothorax or effusion.  Stable scoliosis.  IMPRESSION: No acute cardiopulmonary abnormality.  Original Report Authenticated By: Harley Hallmark, M.D.   Ct Head Wo Contrast  06/09/2011  *RADIOLOGY REPORT*  Clinical Data: Weakness and altered mental status  CT HEAD WITHOUT CONTRAST  Technique:  Contiguous axial images were obtained from the base of the skull through the vertex without contrast.  Comparison: 03/26/2011  Findings: There is diffuse patchy low density throughout the subcortical and periventricular white matter consistent with chronic small vessel ischemic change.  There is prominence of the sulci and ventricles consistent with brain atrophy.  Right frontal lobe calcification and adjacent encephalomalacia is again noted.  There is ex vacuo dilatation of the anterior  horn of the right lateral ventricle.  There is no evidence for acute brain infarct, hemorrhage or mass.  The paranasal sinuses are clear.  The mastoid air cells are clear. The skull appears intact.  IMPRESSION:  1.  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 2.  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 3.  No acute intracranial abnormalities.  Original Report Authenticated By: Rosealee Albee, M.D.   Mr Angiogram Head Wo Contrast  06/09/2011  *RADIOLOGY REPORT*  Clinical Data:  New onset gait instability and slurred speech and weakness.  History of brain tumor with radiation and chemotherapy.  MRI HEAD  WITHOUT AND WITH CONTRAST MRA HEAD WITHOUT CONTRAST  Technique:  Multiplanar, multiecho pulse sequences of the brain and surrounding structures were obtained without and with intravenous contrast.  Angiographic images of the head were obtained using MRA technique without contrast.  Contrast: 15mL MULTIHANCE GADOBENATE DIMEGLUMINE 529 MG/ML IV SOLN  Comparison:  CT 06/09/2011, MRI 03/24/2011  MRI HEAD WITHOUT AND WITH CONTRAST  Findings:  Generalized atrophy.  Encephalomalacia in the right frontal temporal lobe is unchanged and does  not show abnormal enhancement.  There is a coarse calcification in this area on CT. No abnormal enhancement.  This may be a site of prior tumor treatment without recurrence.  No evidence of craniotomy is identified.  Chronic microvascular ischemia is present throughout the white matter.  Chronic infarct right thalamus.  Negative for acute infarct.  Numerous areas of chronic micro hemorrhage is seen throughout the brain. This may be related to prior radiation to the head.  Chronic hypertension and cerebral amyloid can also cause this.  Postcontrast images the brain reveal normal enhancement.  No mass lesion is present.  IMPRESSION: Atrophy and chronic microvascular ischemic changes. Encephalomalacia in the right frontal temporal lobe may be related to prior tumor treatment.  Extensive chronic micro hemorrhage throughout the brain may be related prior cranial radiation.  Negative for acute infarct.  MRA HEAD  Findings: Both vertebral arteries are patent to the basilar. Basilar is widely patent.  Superior cerebellar arteries are patent. 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  Left posterior cerebral artery has a mild stenosis in the midportion.  Internal carotid artery is patent bilaterally without stenosis. The anterior and middle cerebral arteries are patent bilaterally without significant stenosis.  Negative for cerebral aneurysm.  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  Mild stenosis left posterior cerebral artery.  *RADIOLOGY REPORT*  Clinical Data:  New onset gait instability and slurred speech. Rule out TIA.  MRA NECK WITHOUT AND WITH CONTRAST  Technique:  Angiographic images of the neck were obtained using MRA technique without and with intravenous contrast.  Carotid stenosis measurements (when applicable) are obtained utilizing NASCET criteria, using the distal internal carotid diameter as the denominator.  Contrast: 15mL MULTIHANCE GADOBENATE DIMEGLUMINE 529 MG/ML IV SOLN  Comparison:   None.  Findings:  Suboptimal study due to timing of the injection.  There is suboptimal contrast opacification. The arteries are better visualized on the delayed venous images.  Both vertebral arteries are widely patent without stenosis.  Both carotid arteries are widely patent without stenosis.  IMPRESSION: No significant carotid or vertebral artery stenosis.  Suboptimal study for technical reasons.  Original Report Authenticated By: Camelia Phenes, M.D.   Mr Angiogram Neck W Wo Contrast  06/09/2011  *RADIOLOGY REPORT*  Clinical Data:  New onset gait instability and slurred speech  and weakness.  History of brain tumor with radiation and chemotherapy.  MRI HEAD WITHOUT AND WITH CONTRAST MRA HEAD WITHOUT CONTRAST  Technique:  Multiplanar, multiecho pulse sequences of the brain and surrounding structures were obtained without and with intravenous contrast.  Angiographic images of the head were obtained using MRA technique without contrast.  Contrast: 15mL MULTIHANCE GADOBENATE DIMEGLUMINE 529 MG/ML IV SOLN  Comparison:  CT 06/09/2011, MRI 03/24/2011  MRI HEAD WITHOUT AND WITH CONTRAST  Findings:  Generalized atrophy.  Encephalomalacia in the right frontal temporal lobe is unchanged and does  not show abnormal enhancement.  There is a coarse calcification in this area on CT. No abnormal enhancement.  This may be a  site of prior tumor treatment without recurrence.  No evidence of craniotomy is identified.  Chronic microvascular ischemia is present throughout the white matter.  Chronic infarct right thalamus.  Negative for acute infarct.  Numerous areas of chronic micro hemorrhage is seen throughout the brain. This may be related to prior radiation to the head.  Chronic hypertension and cerebral amyloid can also cause this.  Postcontrast images the brain reveal normal enhancement.  No mass lesion is present.  IMPRESSION: Atrophy and chronic microvascular ischemic changes. Encephalomalacia in the right frontal temporal lobe may be related to prior tumor treatment.  Extensive chronic micro hemorrhage throughout the brain may be related prior cranial radiation.  Negative for acute infarct.  MRA HEAD  Findings: Both vertebral arteries are patent to the basilar. Basilar is widely patent.  Superior cerebellar arteries are patent. 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  Left posterior cerebral artery has a mild stenosis in the midportion.  Internal carotid artery is patent bilaterally without stenosis. The anterior and middle cerebral arteries are patent bilaterally without significant stenosis.  Negative for cerebral aneurysm.  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  Mild stenosis left posterior cerebral artery.  *RADIOLOGY REPORT*  Clinical Data:  New onset gait instability and slurred speech. Rule out TIA.  MRA NECK WITHOUT AND WITH CONTRAST  Technique:  Angiographic images of the neck were obtained using MRA technique without and with intravenous contrast.  Carotid stenosis measurements (when applicable) are obtained utilizing NASCET criteria, using the distal internal carotid diameter as the denominator.  Contrast: 15mL MULTIHANCE GADOBENATE DIMEGLUMINE 529 MG/ML IV SOLN  Comparison:   None.  Findings:  Suboptimal study due to timing of the  injection.  There is suboptimal contrast opacification. The arteries are better visualized on the delayed venous images.  Both vertebral arteries are widely patent without stenosis.  Both carotid arteries are widely patent without stenosis.  IMPRESSION: No significant carotid or vertebral artery stenosis.  Suboptimal study for technical reasons.  Original Report Authenticated By: Camelia Phenes, M.D.   Mr Laqueta Jean Wo Contrast  06/09/2011  *RADIOLOGY REPORT*  Clinical Data:  New onset gait instability and slurred speech and weakness.  History of brain tumor with radiation and chemotherapy.  MRI HEAD WITHOUT AND WITH CONTRAST MRA HEAD WITHOUT CONTRAST  Technique:  Multiplanar, multiecho pulse sequences of the brain and surrounding structures were obtained without and with intravenous contrast.  Angiographic images of the head were obtained using MRA technique without contrast.  Contrast: 15mL MULTIHANCE GADOBENATE DIMEGLUMINE 529 MG/ML IV SOLN  Comparison:  CT 06/09/2011, MRI 03/24/2011  MRI HEAD WITHOUT AND WITH CONTRAST  Findings:  Generalized atrophy.  Encephalomalacia in the right frontal temporal lobe is unchanged and does  not show abnormal enhancement.  There is a coarse calcification in this area on CT. No abnormal enhancement.  This may be a site of prior tumor treatment without recurrence.  No evidence of craniotomy is identified.  Chronic microvascular ischemia is present throughout the white matter.  Chronic infarct right thalamus.  Negative for acute infarct.  Numerous areas of chronic micro hemorrhage is seen throughout the brain. This may be related to prior radiation to the head.  Chronic hypertension and cerebral amyloid can also cause this.  Postcontrast images the brain reveal normal enhancement.  No mass lesion is present.  IMPRESSION: Atrophy and chronic microvascular ischemic changes. Encephalomalacia in the right frontal temporal lobe may be related to prior tumor treatment.  Extensive chronic  micro hemorrhage throughout the brain may be related prior cranial radiation.  Negative for acute infarct.  MRA HEAD  Findings: Both vertebral arteries are patent to the basilar. Basilar is widely patent.  Superior cerebellar arteries are patent. 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  Left posterior cerebral artery has a mild stenosis in the midportion.  Internal carotid artery is patent bilaterally without stenosis. The anterior and middle cerebral arteries are patent bilaterally without significant stenosis.  Negative for cerebral aneurysm.  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  Mild stenosis left posterior cerebral artery.  *RADIOLOGY REPORT*  Clinical Data:  New onset gait instability and slurred speech. Rule out TIA.  MRA NECK WITHOUT AND WITH CONTRAST  Technique:  Angiographic images of the neck were obtained using MRA technique without and with intravenous contrast.  Carotid stenosis measurements (when applicable) are obtained utilizing NASCET criteria, using the distal internal carotid diameter as the denominator.  Contrast: 15mL MULTIHANCE GADOBENATE DIMEGLUMINE 529 MG/ML IV SOLN  Comparison:   None.  Findings:  Suboptimal study due to timing of the injection.  There is suboptimal contrast opacification. The arteries are better visualized on the delayed venous images.  Both vertebral arteries are widely patent without stenosis.  Both carotid arteries are widely patent without stenosis.  IMPRESSION: No significant carotid or vertebral artery stenosis.  Suboptimal study for technical reasons.  Original Report Authenticated By: Camelia Phenes, M.D.    Medications: I have reviewed the patient's current medications. Scheduled Meds:   . sodium chloride   Intravenous STAT  . aspirin  324 mg Oral Once  . atorvastatin  40 mg Oral q1800  . atropine      . atropine  0.5 mg Intravenous Once  . atropine  0.5 mg  Intravenous Once  . calcium-vitamin D  1 tablet Oral Daily  . darifenacin  7.5 mg Oral Daily  . dipyridamole-aspirin  1 capsule Oral BID  . levETIRAcetam  1,000 mg Oral QHS  . levETIRAcetam  500 mg Oral Daily  . mulitivitamin with minerals  1 tablet Oral Daily  . sodium chloride  250 mL Intravenous Once  . sodium chloride  3 mL Intravenous Q12H  . DISCONTD: levETIRAcetam  500-1,000 mg Oral BID  . 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 Rehab  vs HH     LOS: 1 day  Cortland Crehan, DO 06/10/2011, 8:23 AM

## 2011-06-11 LAB — URINE CULTURE

## 2011-06-11 MED ORDER — LOPERAMIDE HCL 2 MG PO CAPS
2.0000 mg | ORAL_CAPSULE | ORAL | Status: DC | PRN
  Administered 2011-06-11: 2 mg via ORAL
  Filled 2011-06-11 (×2): qty 1
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
## 2011-06-11 NOTE — Progress Notes (Signed)
06/11/2011 Fransico Michael SPARKS Case Management Note [phone]  HOME HEALTH AGENCIES SERVING [facility]   Agencies that are Medicare-Certified and are affiliated with [facility]  Telephone Number Address  [facility]   The [facility] has ownership interest in this company; however, you are under no obligation to use this agency. [phone] or  [phone] [address] [place], [place] [address]   Agencies that are Medicare-Certified and are not affiliated with [facility] Agency Telephone Number Address  [facility] [phone] Fax [phone] [address] [place], [place]  [address]  [facility] [phone] or [phone] Fax [phone] [address] [place], [place] [address]  [facility] [phone] Fax [phone] [address] [place], [place] [address]  [facility] [phone] Fax [phone] [address] [place], [place]  [address]  [facility] [phone] Fax [phone] [address], Suite [place], [place] [address]  [facility] [phone] [address] [place], [place] [address]  [facility] [phone]  [address] [place], [place] [address]  [facility] [phone] or [phone] Fax [phone] [address] [place], [place]  [address]  [facility] [phone] Fax [phone] [address] [place], [place]  [address]  [facility]  [phone] Fax [phone] [address] [place], [place] [address]               Agencies that are not Medicare-Certified and are not affiliated with [facility] Agency Telephone Number Address  [facility] [place], [place] [phone] or [phone] Fax [phone] [address] [place]  [address]  [facility] [phone] Fax [phone] [address] [place], [place]  [address]  [facility]  [phone] Fax [phone] [address] [place], [place] [address]  [facility] [phone] Fax [phone] [address] [place], [place] [address]  [facility] [phone] or [phone] Fax [phone] [address] [place], [place]  [address]  [facility] [phone] or [phone] Fax [phone] [address] Suite [place], [place]  [address]  [facility] [phone] Fax [phone] [address] [place], [place]  [address]  [facility] [phone] [address] [place], [place]  [address]  [facility] [phone] Fax [phone] [address] [place], [place]  [address]  [facility] [phone] Fax [phone] [address] [place], [place]  [address]  [facility] [phone] Fax [phone] [address] [place], [place] [address]  [facility] [phone] Fax [phone] [address] [place], [place]  [address]   In to speak with patient regarding recommendations for home OT/PT  services. Choice of agencies given to patient. Chose [facility]. Appropriate referral will be made.

## 2011-06-11 NOTE — Progress Notes (Addendum)
Physical Therapy Treatment Patient Details Name: Rodney Potts MRN: [id number] DOB: August 31, 1945 Today's Date: 06/11/2011  PT Assessment/Plan  PT - Assessment/Plan Comments on Treatment Session: Patient with improving functional mobility/activity tolerance.  PT Plan: Frequency remains appropriate Follow Up Recommendations: Home health PT;Supervision/Assistance - 24 hour Equipment Recommended: Defer to next venue PT Goals  Acute Rehab PT Goals PT Goal: Supine/Side to Sit - Progress: Met PT Goal: Sit to Stand - Progress: Progressing toward goal PT Goal: Stand to Sit - Progress: Progressing toward goal PT Goal: Ambulate - Progress: Progressing toward goal  PT Treatment Precautions/Restrictions  Precautions Precautions: Fall Precaution Comments: h/o falls, weakness, confusion Required Braces or Orthoses: No Restrictions Weight Bearing Restrictions: No Mobility (including Balance) Bed Mobility Supine to Sit: 6: Modified independent (Device/Increase time) Transfers Sit to Stand: Other (comment);With upper extremity assist;From bed (Min-guard assistance) Sit to Stand Details (indicate cue type and reason): Practiced 4 times during functional activity and rest breaks. Re-educated in hand placement to walker as attempts to pull up. Occasional weight posterior with stand so worked on impeoved anterior weight shift. Stand to Sit: Other (comment) (Min-guard assistance) Ambulation/Gait Ambulation/Gait Assistance: 4: Min assist Ambulation/Gait Assistance Details (indicate cue type and reason): Worked on improved foot clearance/heel strike, improved upright posture, and balane with gait. Patient able to make changes with gait pattern but requires cueing 50% of the time to correct  Ambulation Distance (Feet): 200 Feet Assistive device: Rolling walker Gait Pattern: Step-through pattern;Decreased stride length;Shuffle;Decreased dorsiflexion - left;Decreased hip/knee flexion - right;Decreased  hip/knee flexion - left;Decreased stance time - left (Some pre-morbid limitations secondary to lack of left knee extension)  Static Standing Balance Static Standing - Comment/# of Minutes: Worked on romberg/semi tandem with eyes open/closed and with pertubations. Patient with signifcant decline in balance with eyes clsoed with static activites -not dynamic and tendency to lose balance to left. High Level Balance High Level Balance Activites: Turns High Level Balance Comments: forward lunges - alternating and with eyes open/closed End of Session PT - End of Session Equipment Utilized During Treatment: Gait belt Activity Tolerance: Patient tolerated treatment well Patient left: in chair;with call bell in reach General Behavior During Session: [facility] for tasks performed Cognition: [facility] for tasks performed  Edwyna Perfect, PT  Pager [phone]  06/11/2011, 11:00 AM

## 2011-06-11 NOTE — Progress Notes (Signed)
Utilization review completed. Rodney Potts 06/11/2011 

## 2011-06-11 NOTE — Progress Notes (Addendum)
Subjective: Speech and gait both improved   Objective: Vital signs in last 24 hours: Filed Vitals:   06/10/11 1600 06/10/11 2100 06/11/11 0100 06/11/11 0500  BP: 107/69 111/79 106/64 107/65  Pulse: 54 57 52 55  Temp: 97.9 F (36.6 C) 97.8 F (36.6 C) 98 F (36.7 C) 98.1 F (36.7 C)  TempSrc: Oral Oral Oral Oral  Resp: 18 18 18 18   Height:      Weight:      SpO2: 98% 96% 96% 95%   Weight change:   Intake/Output Summary (Last 24 hours) at 06/11/11 0935 Last data filed at 06/11/11 0900  Gross per 24 hour  Intake    960 ml  Output   1301 ml  Net   -341 ml    Physical Exam: General: Awake, Oriented, No acute distress. HEENT: EOMI. Neck: Supple CV: S1 and S2, brady Lungs: Clear to ascultation bilaterally Abdomen: Soft, Nontender, Nondistended, +bowel sounds. Ext: Good pulses. Trace edema.  Lab Results:  [facility] 06/10/11 0007 06/09/11 1122  NA 139 142  K 3.8 4.1  CL 103 105  CO2 29 32  GLUCOSE 74 82  BUN 16 15  CREATININE 0.87 0.92  CALCIUM 8.8 9.5  MG -- --  PHOS -- --    Basename 06/09/11 1122  AST 17  ALT 10  ALKPHOS 59  BILITOT 0.3  PROT 6.2  ALBUMIN 3.6   No results found for this basename: LIPASE:2,AMYLASE:2 in the last 72 hours  Basename 06/10/11 0007 06/09/11 1122  WBC 6.1 6.3  NEUTROABS -- 4.0  HGB 12.7* 14.3  HCT 38.4* 42.8  MCV 93.7 94.1  PLT 220 223    Basename 06/10/11 0834 06/10/11 0007 06/09/11 1650  CKTOTAL 146 173 236*  CKMB 6.2* 7.6* 10.8*  CKMBINDEX -- -- --  TROPONINI <0.30 <0.30 <0.30   No components found with this basename: POCBNP:3 No results found for this basename: DDIMER:2 in the last 72 hours  Basename 06/10/11 0007  HGBA1C 5.5    Basename 06/10/11 0007  CHOL 130  HDL 55  LDLCALC 64  TRIG 53  CHOLHDL 2.4  LDLDIRECT --   No results found for this basename: TSH,T4TOTAL,FREET3,T3FREE,THYROIDAB in the last 72 hours No results found for this basename:  VITAMINB12:2,FOLATE:2,FERRITIN:2,TIBC:2,IRON:2,RETICCTPCT:2 in the last 72 hours  Micro Results: Recent Results (from the past 240 hour(s))  URINE CULTURE     Status: Normal (Preliminary result)   Collection Time   06/09/11  9:46 PM      Component Value Range Status Comment   Specimen Description URINE, CLEAN CATCH   Final    Special Requests NONE   Final    Culture  Setup Time [id number]   Final    Colony Count PENDING   Incomplete    Culture Culture reincubated for better growth   Final    Report Status PENDING   Incomplete     Studies/Results: Dg Chest 1 View  06/09/2011  *RADIOLOGY REPORT*  Clinical Data: 66 year old male with weakness.  CHEST - 1 VIEW  Comparison: 03/24/2011 and earlier.  Findings: AP semi upright view 1152 hours.  Slightly lower lung volumes.  Cardiac size and mediastinal contours are within normal limits.  Visualized tracheal air column is within normal limits. Allowing for portable technique, the lungs are clear.  No pneumothorax or effusion.  Stable scoliosis.  IMPRESSION: No acute cardiopulmonary abnormality.  Original Report Authenticated By: Harley Hallmark, M.D.   Ct Head Wo Contrast  06/09/2011  *RADIOLOGY REPORT*  Clinical Data: Weakness and altered mental status  CT HEAD WITHOUT CONTRAST  Technique:  Contiguous axial images were obtained from the base of the skull through the vertex without contrast.  Comparison: 03/26/2011  Findings: There is diffuse patchy low density throughout the subcortical and periventricular white matter consistent with chronic small vessel ischemic change.  There is prominence of the sulci and ventricles consistent with brain atrophy.  Right frontal lobe calcification and adjacent encephalomalacia is again noted.  There is ex vacuo dilatation of the anterior horn of the right lateral ventricle.  There is no evidence for acute brain infarct, hemorrhage or mass.  The paranasal sinuses are clear.  The mastoid air cells are clear. The skull  appears intact.  IMPRESSION:  1.  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 2.  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 3.  No acute intracranial abnormalities.  Original Report Authenticated By: Rosealee Albee, M.D.   Mr Angiogram Head Wo Contrast  06/09/2011  *RADIOLOGY REPORT*  Clinical Data:  New onset gait instability and slurred speech and weakness.  History of brain tumor with radiation and chemotherapy.  MRI HEAD WITHOUT AND WITH CONTRAST MRA HEAD WITHOUT CONTRAST  Technique:  Multiplanar, multiecho pulse sequences of the brain and surrounding structures were obtained without and with intravenous contrast.  Angiographic images of the head were obtained using MRA technique without contrast.  Contrast: 15mL MULTIHANCE GADOBENATE DIMEGLUMINE 529 MG/ML IV SOLN  Comparison:  CT 06/09/2011, MRI 03/24/2011  MRI HEAD WITHOUT AND WITH CONTRAST  Findings:  Generalized atrophy.  Encephalomalacia in the right frontal temporal lobe is unchanged and does  not show abnormal enhancement.  There is a coarse calcification in this area on CT. No abnormal enhancement.  This may be a site of prior tumor treatment without recurrence.  No evidence of craniotomy is identified.  Chronic microvascular ischemia is present throughout the white matter.  Chronic infarct right thalamus.  Negative for acute infarct.  Numerous areas of chronic micro hemorrhage is seen throughout the brain. This may be related to prior radiation to the head.  Chronic hypertension and cerebral amyloid can also cause this.  Postcontrast images the brain reveal normal enhancement.  No mass lesion is present.  IMPRESSION: Atrophy and chronic microvascular ischemic changes. Encephalomalacia in the right frontal temporal lobe may be related to prior tumor treatment.  Extensive chronic micro hemorrhage throughout the brain may be related prior cranial radiation.  Negative for acute infarct.  MRA HEAD  Findings: Both  vertebral arteries are patent to the basilar. Basilar is widely patent.  Superior cerebellar arteries are patent. 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  Left posterior cerebral artery has a mild stenosis in the midportion.  Internal carotid artery is patent bilaterally without stenosis. The anterior and middle cerebral arteries are patent bilaterally without significant stenosis.  Negative for cerebral aneurysm.  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  Mild stenosis left posterior cerebral artery.  *RADIOLOGY REPORT*  Clinical Data:  New onset gait instability and slurred speech. Rule out TIA.  MRA NECK WITHOUT AND WITH CONTRAST  Technique:  Angiographic images of the neck were obtained using MRA technique without and with intravenous contrast.  Carotid stenosis measurements (when applicable) are obtained utilizing NASCET criteria, using the distal internal carotid diameter as the denominator.  Contrast: 15mL MULTIHANCE GADOBENATE DIMEGLUMINE 529 MG/ML IV SOLN  Comparison:   None.  Findings:  Suboptimal  study due to timing of the injection.  There is suboptimal contrast opacification. The arteries are better visualized on the delayed venous images.  Both vertebral arteries are widely patent without stenosis.  Both carotid arteries are widely patent without stenosis.  IMPRESSION: No significant carotid or vertebral artery stenosis.  Suboptimal study for technical reasons.  Original Report Authenticated By: Camelia Phenes, M.D.   Mr Angiogram Neck W Wo Contrast  06/09/2011  *RADIOLOGY REPORT*  Clinical Data:  New onset gait instability and slurred speech and weakness.  History of brain tumor with radiation and chemotherapy.  MRI HEAD WITHOUT AND WITH CONTRAST MRA HEAD WITHOUT CONTRAST  Technique:  Multiplanar, multiecho pulse sequences of the brain and surrounding structures were obtained without and with intravenous contrast.   Angiographic images of the head were obtained using MRA technique without contrast.  Contrast: 15mL MULTIHANCE GADOBENATE DIMEGLUMINE 529 MG/ML IV SOLN  Comparison:  CT 06/09/2011, MRI 03/24/2011  MRI HEAD WITHOUT AND WITH CONTRAST  Findings:  Generalized atrophy.  Encephalomalacia in the right frontal temporal lobe is unchanged and does  not show abnormal enhancement.  There is a coarse calcification in this area on CT. No abnormal enhancement.  This may be a site of prior tumor treatment without recurrence.  No evidence of craniotomy is identified.  Chronic microvascular ischemia is present throughout the white matter.  Chronic infarct right thalamus.  Negative for acute infarct.  Numerous areas of chronic micro hemorrhage is seen throughout the brain. This may be related to prior radiation to the head.  Chronic hypertension and cerebral amyloid can also cause this.  Postcontrast images the brain reveal normal enhancement.  No mass lesion is present.  IMPRESSION: Atrophy and chronic microvascular ischemic changes. Encephalomalacia in the right frontal temporal lobe may be related to prior tumor treatment.  Extensive chronic micro hemorrhage throughout the brain may be related prior cranial radiation.  Negative for acute infarct.  MRA HEAD  Findings: Both vertebral arteries are patent to the basilar. Basilar is widely patent.  Superior cerebellar arteries are patent. 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  Left posterior cerebral artery has a mild stenosis in the midportion.  Internal carotid artery is patent bilaterally without stenosis. The anterior and middle cerebral arteries are patent bilaterally without significant stenosis.  Negative for cerebral aneurysm.  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  Mild stenosis left posterior cerebral artery.  *RADIOLOGY REPORT*  Clinical Data:  New onset gait instability and slurred  speech. Rule out TIA.  MRA NECK WITHOUT AND WITH CONTRAST  Technique:  Angiographic images of the neck were obtained using MRA technique without and with intravenous contrast.  Carotid stenosis measurements (when applicable) are obtained utilizing NASCET criteria, using the distal internal carotid diameter as the denominator.  Contrast: 15mL MULTIHANCE GADOBENATE DIMEGLUMINE 529 MG/ML IV SOLN  Comparison:   None.  Findings:  Suboptimal study due to timing of the injection.  There is suboptimal contrast opacification. The arteries are better visualized on the delayed venous images.  Both vertebral arteries are widely patent without stenosis.  Both carotid arteries are widely patent without stenosis.  IMPRESSION: No significant carotid or vertebral artery stenosis.  Suboptimal study for technical reasons.  Original Report Authenticated By: Camelia Phenes, M.D.   Mr Laqueta Jean Wo Contrast  06/09/2011  *RADIOLOGY REPORT*  Clinical Data:  New onset gait instability and slurred speech and weakness.  History of brain tumor  with radiation and chemotherapy.  MRI HEAD WITHOUT AND WITH CONTRAST MRA HEAD WITHOUT CONTRAST  Technique:  Multiplanar, multiecho pulse sequences of the brain and surrounding structures were obtained without and with intravenous contrast.  Angiographic images of the head were obtained using MRA technique without contrast.  Contrast: 15mL MULTIHANCE GADOBENATE DIMEGLUMINE 529 MG/ML IV SOLN  Comparison:  CT 06/09/2011, MRI 03/24/2011  MRI HEAD WITHOUT AND WITH CONTRAST  Findings:  Generalized atrophy.  Encephalomalacia in the right frontal temporal lobe is unchanged and does  not show abnormal enhancement.  There is a coarse calcification in this area on CT. No abnormal enhancement.  This may be a site of prior tumor treatment without recurrence.  No evidence of craniotomy is identified.  Chronic microvascular ischemia is present throughout the white matter.  Chronic infarct right thalamus.  Negative for  acute infarct.  Numerous areas of chronic micro hemorrhage is seen throughout the brain. This may be related to prior radiation to the head.  Chronic hypertension and cerebral amyloid can also cause this.  Postcontrast images the brain reveal normal enhancement.  No mass lesion is present.  IMPRESSION: Atrophy and chronic microvascular ischemic changes. Encephalomalacia in the right frontal temporal lobe may be related to prior tumor treatment.  Extensive chronic micro hemorrhage throughout the brain may be related prior cranial radiation.  Negative for acute infarct.  MRA HEAD  Findings: Both vertebral arteries are patent to the basilar. Basilar is widely patent.  Superior cerebellar arteries are patent. 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  Left posterior cerebral artery has a mild stenosis in the midportion.  Internal carotid artery is patent bilaterally without stenosis. The anterior and middle cerebral arteries are patent bilaterally without significant stenosis.  Negative for cerebral aneurysm.  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  Mild stenosis left posterior cerebral artery.  *RADIOLOGY REPORT*  Clinical Data:  New onset gait instability and slurred speech. Rule out TIA.  MRA NECK WITHOUT AND WITH CONTRAST  Technique:  Angiographic images of the neck were obtained using MRA technique without and with intravenous contrast.  Carotid stenosis measurements (when applicable) are obtained utilizing NASCET criteria, using the distal internal carotid diameter as the denominator.  Contrast: 15mL MULTIHANCE GADOBENATE DIMEGLUMINE 529 MG/ML IV SOLN  Comparison:   None.  Findings:  Suboptimal study due to timing of the injection.  There is suboptimal contrast opacification. The arteries are better visualized on the delayed venous images.  Both vertebral arteries are widely patent without stenosis.  Both carotid arteries are  widely patent without stenosis.  IMPRESSION: No significant carotid or vertebral artery stenosis.  Suboptimal study for technical reasons.  Original Report Authenticated By: Camelia Phenes, M.D.    Medications: I have reviewed the patient's current medications. Scheduled Meds:    . sodium chloride   Intravenous STAT  . atorvastatin  40 mg Oral q1800  . atropine  0.5 mg Intravenous Once  . calcium-vitamin D  1 tablet Oral Daily  . darifenacin  7.5 mg Oral Daily  . dipyridamole-aspirin  1 capsule Oral BID  . levETIRAcetam  1,000 mg Oral QHS  . levETIRAcetam  500 mg Oral Daily  . mulitivitamin with minerals  1 tablet Oral Daily  . sodium chloride  250 mL Intravenous Once  . sodium chloride  3 mL Intravenous Q12H   Continuous Infusions:  PRN Meds:.sodium chloride, acetaminophen, acetaminophen, loperamide, ondansetron (ZOFRAN) IV, senna-docusate, sodium chloride  Assessment/Plan: Phenobarbitol toxicity- holding medications- trend level (51-44- 41 today), appreciate neurology's help, once back in therapeutic range (<40) will plan on resuming phenobarbital 97.2 mg at one and a half tablets each bedtime, instead of 2 tablets, once phenobarbital level has returned to normal therapeutic range < 35 May need to go home and follow up with Dr. 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 Rehab vs HH     LOS: 2 days  Jamiah Recore, DO 06/11/2011, 9:35 AM

## 2011-06-11 NOTE — Progress Notes (Signed)
06/11/2011 [facility], Bosie Clos SPARKS Case Management Note [phone]    CARE MANAGEMENT NOTE 06/11/2011  Patient:  MACINTYRE, ALEXA   Account Number:  [id number]  Date Initiated:  06/11/2011  Documentation initiated by:  Fransico Michael  Subjective/Objective Assessment:   admitted on 06/09/11 with slurred speech and weakness     Action/Plan:   prior to admission, patient lived at home with spouse. Independent with ADLs   Anticipated DC Date:  06/12/2011   Anticipated DC Plan:  HOME W HOME HEALTH SERVICES      DC Planning Services  CM consult      [facility] Choice  HOME HEALTH   Choice offered to / List presented to:  [name]        HH arranged  HH-2 PT      [facility] agency  [facility]   Status of service:  In process, will continue to follow Medicare Important Message given?   (If response is "NO", the following Medicare IM given date fields will be blank) Date Medicare IM given:   Date Additional Medicare IM given:    Discharge Disposition:    Per UR Regulation:  Reviewed for med. necessity/level of care/duration of stay  If discussed at Long Length of Stay Meetings, dates discussed:    Comments:  PCP: Fredricka BonineZORIAN, GUNDERMAN (spouse) [phone]  06/11/11-1518-J.Elna Radovich,RN,BSN [phone]      In to speak with patient to offer choice of home care agencies due to recommendations of home health PT. [facility]. [name], RN notified of referral. CM will continue to follow.

## 2011-06-11 NOTE — Progress Notes (Signed)
[facility] PROGRESS NOTE    SUBJECTIVE   Speech has improved and feels his gait has also improved as phenobarbital levels have become therapeutic.   OBJECTIVE   Vital signs in last 24 hours: Temp:  [97.3 F (36.3 C)-98.1 F (36.7 C)] 98.1 F (36.7 C) (04/08 0500) Pulse Rate:  [52-68] 55  (04/08 0500) Resp:  [18-20] 18  (04/08 0500) BP: (88-111)/(56-79) 107/65 mmHg (04/08 0500) SpO2:  [92 %-98 %] 95 % (04/08 0500)  Intake/Output from previous day: 04/07 0701 - 04/08 0700 In: 840 [P.O.:840] Out: 1525 [Urine:1525] Intake/Output this shift:   Nutritional status: Cardiac  Past Medical History  Diagnosis Date  . Brain tumor     glioma  . Cancer     PROSTATE CANCER  . TIA (transient ischemic attack)     APPROX N2 MONTHS AGO 01/2011  . Sleep apnea   . IBS (irritable bowel syndrome)   . Shingles   . Cataract   . Seizures   . CVA (cerebral infarction)     R thalamic  . Stroke     Neurologic Exam:  Mental Status: Alert, oriented, thought content appropriate.  Speech fluent without evidence of aphasia. Able to follow 3 step commands without difficulty. Cranial Nerves: II-Visual fields grossly intact. III/IV/VI-Extraocular movements intact.  Pupils reactive bilaterally. V/VII-Smile symmetric VIII-grossly intact IX/X-normal gag XI-bilateral shoulder shrug XII-midline tongue extension Motor: 5/5 bilaterally with normal tone and bulk Sensory: Pinprick and light touch intact throughout, bilaterally Deep Tendon Reflexes: 1+ and symmetric throughout Plantars: mute bilaterally Cerebellar: Normal finger-to-nose, normal heel-to-shin test.    Lab Results: Lab Results  Component Value Date/Time   CHOL 130 06/10/2011 12:07 AM   Lipid Panel  Basename 06/10/11 0007  CHOL 130  TRIG 53  HDL 55  CHOLHDL 2.4  VLDL 11  LDLCALC 64    Studies/Results: Dg Chest 1 View  06/09/2011  *RADIOLOGY REPORT*  Clinical Data:  65 year old male with weakness.  CHEST - 1 VIEW  Comparison: 03/24/2011 and earlier.  Findings: AP semi upright view 1152 hours.  Slightly lower lung volumes.  Cardiac size and mediastinal contours are within normal limits.  Visualized tracheal air column is within normal limits. Allowing for portable technique, the lungs are clear.  No pneumothorax or effusion.  Stable scoliosis.  IMPRESSION: No acute cardiopulmonary abnormality.  Original Report Authenticated By: Harley Hallmark, M.D.   Ct Head Wo Contrast  06/09/2011  *RADIOLOGY REPORT*  Clinical Data: Weakness and altered mental status  CT HEAD WITHOUT CONTRAST  Technique:  Contiguous axial images were obtained from the base of the skull through the vertex without contrast.  Comparison: 03/26/2011  Findings: There is diffuse patchy low density throughout the subcortical and periventricular white matter consistent with chronic small vessel ischemic change.  There is prominence of the sulci and ventricles consistent with brain atrophy.  Right frontal lobe calcification and adjacent encephalomalacia is again noted.  There is ex vacuo dilatation of the anterior horn of the right lateral ventricle.  There is no evidence for acute brain infarct, hemorrhage or mass.  The paranasal sinuses are clear.  The mastoid air cells are clear. The skull appears intact.  IMPRESSION:  1.  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 2.  Stable, chronic right frontal  lobe encephalomalacia and calcification. 3.  No acute intracranial abnormalities.  Original Report Authenticated By: Rosealee Albee, M.D.   Mr Angiogram Head Wo Contrast  06/09/2011  *RADIOLOGY REPORT*  Clinical Data:  New onset gait instability and slurred speech and weakness.  History of brain tumor with radiation and chemotherapy.  MRI HEAD WITHOUT AND WITH CONTRAST MRA HEAD WITHOUT CONTRAST  Technique:  Multiplanar, multiecho pulse sequences of the brain and surrounding structures were obtained without and  with intravenous contrast.  Angiographic images of the head were obtained using MRA technique without contrast.  Contrast: 15mL MULTIHANCE GADOBENATE DIMEGLUMINE 529 MG/ML IV SOLN  Comparison:  CT 06/09/2011, MRI 03/24/2011  MRI HEAD WITHOUT AND WITH CONTRAST  Findings:  Generalized atrophy.  Encephalomalacia in the right frontal temporal lobe is unchanged and does  not show abnormal enhancement.  There is a coarse calcification in this area on CT. No abnormal enhancement.  This may be a site of prior tumor treatment without recurrence.  No evidence of craniotomy is identified.  Chronic microvascular ischemia is present throughout the white matter.  Chronic infarct right thalamus.  Negative for acute infarct.  Numerous areas of chronic micro hemorrhage is seen throughout the brain. This may be related to prior radiation to the head.  Chronic hypertension and cerebral amyloid can also cause this.  Postcontrast images the brain reveal normal enhancement.  No mass lesion is present.  IMPRESSION: Atrophy and chronic microvascular ischemic changes. Encephalomalacia in the right frontal temporal lobe may be related to prior tumor treatment.  Extensive chronic micro hemorrhage throughout the brain may be related prior cranial radiation.  Negative for acute infarct.  MRA HEAD  Findings: Both vertebral arteries are patent to the basilar. Basilar is widely patent.  Superior cerebellar arteries are patent. 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  Left posterior cerebral artery has a mild stenosis in the midportion.  Internal carotid artery is patent bilaterally without stenosis. The anterior and middle cerebral arteries are patent bilaterally without significant stenosis.  Negative for cerebral aneurysm.  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  Mild stenosis left posterior cerebral artery.  *RADIOLOGY REPORT*  Clinical Data:  New onset gait  instability and slurred speech. Rule out TIA.  MRA NECK WITHOUT AND WITH CONTRAST  Technique:  Angiographic images of the neck were obtained using MRA technique without and with intravenous contrast.  Carotid stenosis measurements (when applicable) are obtained utilizing NASCET criteria, using the distal internal carotid diameter as the denominator.  Contrast: 15mL MULTIHANCE GADOBENATE DIMEGLUMINE 529 MG/ML IV SOLN  Comparison:   None.  Findings:  Suboptimal study due to timing of the injection.  There is suboptimal contrast opacification. The arteries are better visualized on the delayed venous images.  Both vertebral arteries are widely patent without stenosis.  Both carotid arteries are widely patent without stenosis.  IMPRESSION: No significant carotid or vertebral artery stenosis.  Suboptimal study for technical reasons.  Original Report Authenticated By: Camelia Phenes, M.D.   Mr Angiogram Neck W Wo Contrast  06/09/2011  *RADIOLOGY REPORT*  Clinical Data:  New onset gait instability and slurred speech and weakness.  History of brain tumor with radiation and chemotherapy.  MRI HEAD WITHOUT AND WITH CONTRAST MRA HEAD WITHOUT CONTRAST  Technique:  Multiplanar, multiecho pulse sequences of the brain and surrounding structures were obtained without and with intravenous contrast.  Angiographic images of the head were obtained using MRA technique  without contrast.  Contrast: 15mL MULTIHANCE GADOBENATE DIMEGLUMINE 529 MG/ML IV SOLN  Comparison:  CT 06/09/2011, MRI 03/24/2011  MRI HEAD WITHOUT AND WITH CONTRAST  Findings:  Generalized atrophy.  Encephalomalacia in the right frontal temporal lobe is unchanged and does  not show abnormal enhancement.  There is a coarse calcification in this area on CT. No abnormal enhancement.  This may be a site of prior tumor treatment without recurrence.  No evidence of craniotomy is identified.  Chronic microvascular ischemia is present throughout the white matter.  Chronic  infarct right thalamus.  Negative for acute infarct.  Numerous areas of chronic micro hemorrhage is seen throughout the brain. This may be related to prior radiation to the head.  Chronic hypertension and cerebral amyloid can also cause this.  Postcontrast images the brain reveal normal enhancement.  No mass lesion is present.  IMPRESSION: Atrophy and chronic microvascular ischemic changes. Encephalomalacia in the right frontal temporal lobe may be related to prior tumor treatment.  Extensive chronic micro hemorrhage throughout the brain may be related prior cranial radiation.  Negative for acute infarct.  MRA HEAD  Findings: Both vertebral arteries are patent to the basilar. Basilar is widely patent.  Superior cerebellar arteries are patent. 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  Left posterior cerebral artery has a mild stenosis in the midportion.  Internal carotid artery is patent bilaterally without stenosis. The anterior and middle cerebral arteries are patent bilaterally without significant stenosis.  Negative for cerebral aneurysm.  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  Mild stenosis left posterior cerebral artery.  *RADIOLOGY REPORT*  Clinical Data:  New onset gait instability and slurred speech. Rule out TIA.  MRA NECK WITHOUT AND WITH CONTRAST  Technique:  Angiographic images of the neck were obtained using MRA technique without and with intravenous contrast.  Carotid stenosis measurements (when applicable) are obtained utilizing NASCET criteria, using the distal internal carotid diameter as the denominator.  Contrast: 15mL MULTIHANCE GADOBENATE DIMEGLUMINE 529 MG/ML IV SOLN  Comparison:   None.  Findings:  Suboptimal study due to timing of the injection.  There is suboptimal contrast opacification. The arteries are better visualized on the delayed venous images.  Both vertebral arteries are widely patent without  stenosis.  Both carotid arteries are widely patent without stenosis.  IMPRESSION: No significant carotid or vertebral artery stenosis.  Suboptimal study for technical reasons.  Original Report Authenticated By: Camelia Phenes, M.D.   Mr Laqueta Jean Wo Contrast  06/09/2011  *RADIOLOGY REPORT*  Clinical Data:  New onset gait instability and slurred speech and weakness.  History of brain tumor with radiation and chemotherapy.  MRI HEAD WITHOUT AND WITH CONTRAST MRA HEAD WITHOUT CONTRAST  Technique:  Multiplanar, multiecho pulse sequences of the brain and surrounding structures were obtained without and with intravenous contrast.  Angiographic images of the head were obtained using MRA technique without contrast.  Contrast: 15mL MULTIHANCE GADOBENATE DIMEGLUMINE 529 MG/ML IV SOLN  Comparison:  CT 06/09/2011, MRI 03/24/2011  MRI HEAD WITHOUT AND WITH CONTRAST  Findings:  Generalized atrophy.  Encephalomalacia in the right frontal temporal lobe is unchanged and does  not show abnormal enhancement.  There is a coarse calcification in this area on CT. No abnormal enhancement.  This may be a site of prior tumor treatment without recurrence.  No evidence of craniotomy is identified.  Chronic microvascular ischemia is present throughout the white matter.  Chronic infarct right thalamus.  Negative for acute infarct.  Numerous areas of chronic micro hemorrhage is seen throughout the brain. This may be related to prior radiation to the head.  Chronic hypertension and cerebral amyloid can also cause this.  Postcontrast images the brain reveal normal enhancement.  No mass lesion is present.  IMPRESSION: Atrophy and chronic microvascular ischemic changes. Encephalomalacia in the right frontal temporal lobe may be related to prior tumor treatment.  Extensive chronic micro hemorrhage throughout the brain may be related prior cranial radiation.  Negative for acute infarct.  MRA HEAD  Findings: Both vertebral arteries are patent to the  basilar. Basilar is widely patent.  Superior cerebellar arteries are patent. 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  Left posterior cerebral artery has a mild stenosis in the midportion.  Internal carotid artery is patent bilaterally without stenosis. The anterior and middle cerebral arteries are patent bilaterally without significant stenosis.  Negative for cerebral aneurysm.  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  Mild stenosis left posterior cerebral artery.  *RADIOLOGY REPORT*  Clinical Data:  New onset gait instability and slurred speech. Rule out TIA.  MRA NECK WITHOUT AND WITH CONTRAST  Technique:  Angiographic images of the neck were obtained using MRA technique without and with intravenous contrast.  Carotid stenosis measurements (when applicable) are obtained utilizing NASCET criteria, using the distal internal carotid diameter as the denominator.  Contrast: 15mL MULTIHANCE GADOBENATE DIMEGLUMINE 529 MG/ML IV SOLN  Comparison:   None.  Findings:  Suboptimal study due to timing of the injection.  There is suboptimal contrast opacification. The arteries are better visualized on the delayed venous images.  Both vertebral arteries are widely patent without stenosis.  Both carotid arteries are widely patent without stenosis.  IMPRESSION: No significant carotid or vertebral artery stenosis.  Suboptimal study for technical reasons.  Original Report Authenticated By: Camelia Phenes, M.D.    Medications:     Scheduled:   . sodium chloride   Intravenous STAT  . atorvastatin  40 mg Oral q1800  . atropine  0.5 mg Intravenous Once  . calcium-vitamin D  1 tablet Oral Daily  . darifenacin  7.5 mg Oral Daily  . dipyridamole-aspirin  1 capsule Oral BID  . levETIRAcetam  1,000 mg Oral QHS  . levETIRAcetam  500 mg Oral Daily  . mulitivitamin with minerals  1 tablet Oral Daily  . sodium chloride  250 mL Intravenous Once    . sodium chloride  3 mL Intravenous Q12H    Assessment/Plan:   1. Phenobarbital toxicity, resolving. Patient's blood level phenobarbital is 41 today (06/11/2011) with resolving symptoms of slurred speech and improving gait 2. Recommend resuming phenobarbital 97.2 mg at one and a half tablets each bedtime, instead of 2 tablets, once phenobarbital level has returned to normal therapeutic range.  3. Continued physical therapy intervention and recommendations for gait abnormality PT should be seeing patient today.  5. No further stroke workup is indicated.    Felicie Morn PA-C [facility] [phone]  06/11/2011, 8:26 AM

## 2011-06-12 LAB — PHENOBARBITAL LEVEL: Phenobarbital: 33.7 ug/mL (ref 15.0–40.0)

## 2011-06-12 MED ORDER — PHENOBARBITAL 97.2 MG PO TABS: 145.8000 mg | ORAL_TABLET | Freq: Every day | ORAL | Status: DC

## 2011-06-12 MED ORDER — UNABLE TO FIND: Status: DC

## 2011-06-12 NOTE — Discharge Summary (Signed)
Discharge Summary  Rodney Potts MR#: [id number]  DOB:[birth year]  Date of Admission: 06/09/2011 Date of Discharge: 06/12/2011  Patient's PCP: Pamelia Hoit, MD, MD  Attending Physician:Zakyria Metzinger   Discharge Diagnoses: Principal Problem:  *Slurred speech Active Problems:  CVA (cerebral infarction)  Seizures  OSA (obstructive sleep apnea)  Glioma of brain  Weakness generalized   Brief Admitting History and Physical Rodney Potts is a 66 y.o. white male who just came back from [facility] yesterday. Has some mild difficulty walking at baseline but wife thinks it was worse yesterday as he walked upstairs to bed. He does have baseline walking problems related to his knee replacements. Patient woke up this AM with generalized weakness and slurred speech, no facial drooping. No fever. No chills. Patient was recently hospitalized with a CVA back in January 2013. He also has a history of TIAs and a glioma that was treated with radiation and chemotherapy. He thinks he is constantly bradycardic and this is not new.    Discharge Medications Medication List  As of 06/12/2011 11:58 AM   TAKE these medications         atorvastatin 40 MG tablet   Commonly known as: LIPITOR   Take 40 mg by mouth daily.      calcium-vitamin D 500-200 MG-UNIT per tablet   Commonly known as: OSCAL WITH D   Take 1 tablet by mouth daily.      CO Q 10 PO   Take 1 tablet by mouth daily.      dipyridamole-aspirin 25-200 MG per 12 hr capsule   Commonly known as: AGGRENOX   Take 1 capsule by mouth 2 (two) times daily. Take 1 capsule by mouth at bedtime daily until 04/08/2011. From 04/09/2011, take 1 capsule by mouth 2 times daily.      levETIRAcetam 1000 MG tablet   Commonly known as: KEPPRA   Take 500-1,000 mg by mouth 2 (two) times daily. 500 mg in the am and 1000 mg at bedtime.      mulitivitamin with minerals Tabs   Take 1 tablet by mouth daily.      PHENobarbital 97.2 MG tablet   Commonly known as: LUMINAL     Take 1.5 tablets (145.8 mg total) by mouth at bedtime.      solifenacin 10 MG tablet   Commonly known as: VESICARE   Take 10 mg by mouth daily.      UNABLE TO FIND   phenobarbitol level in 1 week- results to Dr. Kathaleen Grinder Course: Slurred speech- due to increased phenobarbitol- held and trended down on own   .Weakness generalized- improved as level of phenobarbitol decreased  .CVA (cerebral infarction)- no sign of new stroke  .Glioma of brain  .OSA (obstructive sleep apnea)  .Seizures    Day of Discharge BP 109/70  Pulse 70  Temp(Src) 97.6 F (36.4 C) (Oral)  Resp 18  Ht 5' 9.5" (1.765 m)  Wt 81.194 kg (179 lb)  BMI 26.05 kg/m2  SpO2 91% A+O x 3 NAD -c/c/e Clear ant  Results for orders placed during the hospital encounter of 06/09/11 (from the past 48 hour(s))  PHENOBARBITAL LEVEL     Status: Abnormal   Collection Time   06/11/11  6:55 AM      Component Value Range Comment   Phenobarbital 41.0 (*) 15.0 - 40.0 (ug/mL)   PHENOBARBITAL LEVEL     Status: Normal   Collection Time  06/12/11  8:48 AM      Component Value Range Comment   Phenobarbital 33.7  15.0 - 40.0 (ug/mL)     Dg Chest 1 View  06/09/2011  *RADIOLOGY REPORT*  Clinical Data: 66 year old male with weakness.  CHEST - 1 VIEW  Comparison: 03/24/2011 and earlier.  Findings: AP semi upright view 1152 hours.  Slightly lower lung volumes.  Cardiac size and mediastinal contours are within normal limits.  Visualized tracheal air column is within normal limits. Allowing for portable technique, the lungs are clear.  No pneumothorax or effusion.  Stable scoliosis.  IMPRESSION: No acute cardiopulmonary abnormality.  Original Report Authenticated By: Harley Hallmark, M.D.   Ct Head Wo Contrast  06/09/2011  *RADIOLOGY REPORT*  Clinical Data: Weakness and altered mental status  CT HEAD WITHOUT CONTRAST  Technique:  Contiguous axial images were obtained from the base of the skull through the vertex  without contrast.  Comparison: 03/26/2011  Findings: There is diffuse patchy low density throughout the subcortical and periventricular white matter consistent with chronic small vessel ischemic change.  There is prominence of the sulci and ventricles consistent with brain atrophy.  Right frontal lobe calcification and adjacent encephalomalacia is again noted.  There is ex vacuo dilatation of the anterior horn of the right lateral ventricle.  There is no evidence for acute brain infarct, hemorrhage or mass.  The paranasal sinuses are clear.  The mastoid air cells are clear. The skull appears intact.  IMPRESSION:  1.  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 2.  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 3.  No acute intracranial abnormalities.  Original Report Authenticated By: Rosealee Albee, M.D.   Mr Angiogram Head Wo Contrast  06/09/2011  *RADIOLOGY REPORT*  Clinical Data:  New onset gait instability and slurred speech and weakness.  History of brain tumor with radiation and chemotherapy.  MRI HEAD WITHOUT AND WITH CONTRAST MRA HEAD WITHOUT CONTRAST  Technique:  Multiplanar, multiecho pulse sequences of the brain and surrounding structures were obtained without and with intravenous contrast.  Angiographic images of the head were obtained using MRA technique without contrast.  Contrast: 15mL MULTIHANCE GADOBENATE DIMEGLUMINE 529 MG/ML IV SOLN  Comparison:  CT 06/09/2011, MRI 03/24/2011  MRI HEAD WITHOUT AND WITH CONTRAST  Findings:  Generalized atrophy.  Encephalomalacia in the right frontal temporal lobe is unchanged and does  not show abnormal enhancement.  There is a coarse calcification in this area on CT. No abnormal enhancement.  This may be a site of prior tumor treatment without recurrence.  No evidence of craniotomy is identified.  Chronic microvascular ischemia is present throughout the white matter.  Chronic infarct right thalamus.  Negative for acute infarct.   Numerous areas of chronic micro hemorrhage is seen throughout the brain. This may be related to prior radiation to the head.  Chronic hypertension and cerebral amyloid can also cause this.  Postcontrast images the brain reveal normal enhancement.  No mass lesion is present.  IMPRESSION: Atrophy and chronic microvascular ischemic changes. Encephalomalacia in the right frontal temporal lobe may be related to prior tumor treatment.  Extensive chronic micro hemorrhage throughout the brain may be related prior cranial radiation.  Negative for acute infarct.  MRA HEAD  Findings: Both vertebral arteries are patent to the basilar. Basilar is widely patent.  Superior cerebellar arteries are patent. 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  Left posterior cerebral artery has a mild stenosis in the midportion.  Internal carotid artery is patent bilaterally without stenosis. The anterior and middle cerebral arteries are patent bilaterally without significant stenosis.  Negative for cerebral aneurysm.  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  Mild stenosis left posterior cerebral artery.  *RADIOLOGY REPORT*  Clinical Data:  New onset gait instability and slurred speech. Rule out TIA.  MRA NECK WITHOUT AND WITH CONTRAST  Technique:  Angiographic images of the neck were obtained using MRA technique without and with intravenous contrast.  Carotid stenosis measurements (when applicable) are obtained utilizing NASCET criteria, using the distal internal carotid diameter as the denominator.  Contrast: 15mL MULTIHANCE GADOBENATE DIMEGLUMINE 529 MG/ML IV SOLN  Comparison:   None.  Findings:  Suboptimal study due to timing of the injection.  There is suboptimal contrast opacification. The arteries are better visualized on the delayed venous images.  Both vertebral arteries are widely patent without stenosis.  Both carotid arteries are widely patent  without stenosis.  IMPRESSION: No significant carotid or vertebral artery stenosis.  Suboptimal study for technical reasons.  Original Report Authenticated By: Camelia Phenes, M.D.   Mr Angiogram Neck W Wo Contrast  06/09/2011  *RADIOLOGY REPORT*  Clinical Data:  New onset gait instability and slurred speech and weakness.  History of brain tumor with radiation and chemotherapy.  MRI HEAD WITHOUT AND WITH CONTRAST MRA HEAD WITHOUT CONTRAST  Technique:  Multiplanar, multiecho pulse sequences of the brain and surrounding structures were obtained without and with intravenous contrast.  Angiographic images of the head were obtained using MRA technique without contrast.  Contrast: 15mL MULTIHANCE GADOBENATE DIMEGLUMINE 529 MG/ML IV SOLN  Comparison:  CT 06/09/2011, MRI 03/24/2011  MRI HEAD WITHOUT AND WITH CONTRAST  Findings:  Generalized atrophy.  Encephalomalacia in the right frontal temporal lobe is unchanged and does  not show abnormal enhancement.  There is a coarse calcification in this area on CT. No abnormal enhancement.  This may be a site of prior tumor treatment without recurrence.  No evidence of craniotomy is identified.  Chronic microvascular ischemia is present throughout the white matter.  Chronic infarct right thalamus.  Negative for acute infarct.  Numerous areas of chronic micro hemorrhage is seen throughout the brain. This may be related to prior radiation to the head.  Chronic hypertension and cerebral amyloid can also cause this.  Postcontrast images the brain reveal normal enhancement.  No mass lesion is present.  IMPRESSION: Atrophy and chronic microvascular ischemic changes. Encephalomalacia in the right frontal temporal lobe may be related to prior tumor treatment.  Extensive chronic micro hemorrhage throughout the brain may be related prior cranial radiation.  Negative for acute infarct.  MRA HEAD  Findings: Both vertebral arteries are patent to the basilar. Basilar is widely patent.  Superior  cerebellar arteries are patent. 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  Left posterior cerebral artery has a mild stenosis in the midportion.  Internal carotid artery is patent bilaterally without stenosis. The anterior and middle cerebral arteries are patent bilaterally without significant stenosis.  Negative for cerebral aneurysm.  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  Mild stenosis left posterior cerebral artery.  *RADIOLOGY REPORT*  Clinical Data:  New onset gait instability and slurred speech. Rule out TIA.  MRA NECK WITHOUT AND WITH CONTRAST  Technique:  Angiographic images of the neck were obtained using MRA technique without and with intravenous contrast.  Carotid stenosis measurements (when applicable) are obtained  utilizing NASCET criteria, using the distal internal carotid diameter as the denominator.  Contrast: 15mL MULTIHANCE GADOBENATE DIMEGLUMINE 529 MG/ML IV SOLN  Comparison:   None.  Findings:  Suboptimal study due to timing of the injection.  There is suboptimal contrast opacification. The arteries are better visualized on the delayed venous images.  Both vertebral arteries are widely patent without stenosis.  Both carotid arteries are widely patent without stenosis.  IMPRESSION: No significant carotid or vertebral artery stenosis.  Suboptimal study for technical reasons.  Original Report Authenticated By: Camelia Phenes, M.D.   Mr Laqueta Jean Wo Contrast  06/09/2011  *RADIOLOGY REPORT*  Clinical Data:  New onset gait instability and slurred speech and weakness.  History of brain tumor with radiation and chemotherapy.  MRI HEAD WITHOUT AND WITH CONTRAST MRA HEAD WITHOUT CONTRAST  Technique:  Multiplanar, multiecho pulse sequences of the brain and surrounding structures were obtained without and with intravenous contrast.  Angiographic images of the head were obtained using MRA technique without contrast.   Contrast: 15mL MULTIHANCE GADOBENATE DIMEGLUMINE 529 MG/ML IV SOLN  Comparison:  CT 06/09/2011, MRI 03/24/2011  MRI HEAD WITHOUT AND WITH CONTRAST  Findings:  Generalized atrophy.  Encephalomalacia in the right frontal temporal lobe is unchanged and does  not show abnormal enhancement.  There is a coarse calcification in this area on CT. No abnormal enhancement.  This may be a site of prior tumor treatment without recurrence.  No evidence of craniotomy is identified.  Chronic microvascular ischemia is present throughout the white matter.  Chronic infarct right thalamus.  Negative for acute infarct.  Numerous areas of chronic micro hemorrhage is seen throughout the brain. This may be related to prior radiation to the head.  Chronic hypertension and cerebral amyloid can also cause this.  Postcontrast images the brain reveal normal enhancement.  No mass lesion is present.  IMPRESSION: Atrophy and chronic microvascular ischemic changes. Encephalomalacia in the right frontal temporal lobe may be related to prior tumor treatment.  Extensive chronic micro hemorrhage throughout the brain may be related prior cranial radiation.  Negative for acute infarct.  MRA HEAD  Findings: Both vertebral arteries are patent to the basilar. Basilar is widely patent.  Superior cerebellar arteries are patent. 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  Left posterior cerebral artery has a mild stenosis in the midportion.  Internal carotid artery is patent bilaterally without stenosis. The anterior and middle cerebral arteries are patent bilaterally without significant stenosis.  Negative for cerebral aneurysm.  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  Mild stenosis left posterior cerebral artery.  *RADIOLOGY REPORT*  Clinical Data:  New onset gait instability and slurred speech. Rule out TIA.  MRA NECK WITHOUT AND WITH CONTRAST  Technique:  Angiographic  images of the neck were obtained using MRA technique without and with intravenous contrast.  Carotid stenosis measurements (when applicable) are obtained utilizing NASCET criteria, using the distal internal carotid diameter as the denominator.  Contrast: 15mL MULTIHANCE GADOBENATE DIMEGLUMINE 529 MG/ML IV SOLN  Comparison:   None.  Findings:  Suboptimal study due to timing of the injection.  There is suboptimal contrast opacification. The arteries are better visualized on the delayed venous images.  Both vertebral arteries are widely patent without stenosis.  Both carotid arteries are widely patent without stenosis.  IMPRESSION: No significant carotid or vertebral artery stenosis.  Suboptimal study for technical reasons.  Original Report Authenticated By: Camelia Phenes, M.D.  Disposition: home with home health PT/OT  Diet: cardiac  Activity: as tolerated   Follow-up Appts: Discharge Orders    Future Orders Please Complete By Expires   Diet - low sodium heart healthy      Increase activity slowly      Discharge instructions      Comments:   Home health PT/OT Will need 24 hour monitoring Follow up with Dr. Pearlean Brownie- get phenobarbitol level in 1 week Wheeled walker at home      TESTS THAT NEED FOLLOW-UP phenobarbitol in 1 week  Time spent on discharge, talking to the patient, and coordinating care: 60 mins.   SignedMarlin Canary, DO 06/12/2011, 11:58 AM

## 2011-06-12 NOTE — Progress Notes (Signed)
06/12/2011 [facility], Bosie Clos SPARKS Case Management Note [phone]    CARE MANAGEMENT NOTE 06/12/2011  Patient:  Rodney Potts, Rodney Potts   Account Number:  [id number]  Date Initiated:  06/11/2011  Documentation initiated by:  Fransico Michael  Subjective/Objective Assessment:   admitted on 06/09/11 with slurred speech and weakness     Action/Plan:   prior to admission, patient lived at home with spouse. Independent with ADLs   Anticipated DC Date:  06/12/2011   Anticipated DC Plan:  HOME W HOME HEALTH SERVICES      DC Planning Services  CM consult      [facility] Choice  HOME HEALTH   Choice offered to / List presented to:  [name]        HH arranged  HH-2 PT      [facility] agency  [facility]   Status of service:  Completed, signed off Medicare Important Message given?   (If response is "NO", the following Medicare IM given date fields will be blank) Date Medicare IM given:   Date Additional Medicare IM given:    Discharge Disposition:  HOME W HOME HEALTH SERVICES  Per UR Regulation:  Reviewed for med. necessity/level of care/duration of stay  If discussed at Long Length of Stay Meetings, dates discussed:    Comments:  PCP: Fredricka BonineGLYNN, YEPES (spouse) [phone]  06/11/11-1518-[name],RN,BSN [phone]      In to speak with patient to offer choice of home care agencies due to recommendations of home health PT. [facility]. [name], RN notified of referral. CM will continue to follow.

## 2011-06-12 NOTE — Progress Notes (Signed)
Physical Therapy Treatment Patient Details Name: THIERRY DOBOSZ MRN: [id number] DOB: 1945/11/29 Today's Date: 06/12/2011  PT Assessment/Plan  PT - Assessment/Plan Comments on Treatment Session: Pt is making progress towards goals. Pt was educated and giving VC's on body posture during amb due to pt's forward truck posture while amb. Pt was able to tolerance increased activity without any adnoralmities.  PT Plan: Discharge plan remains appropriate;Frequency remains appropriate PT Frequency: Min 4X/week Follow Up Recommendations: Home health PT Equipment Recommended: None recommended by PT PT Goals  Acute Rehab PT Goals PT Goal: Supine/Side to Sit - Progress: Met PT Goal: Sit to Stand - Progress: Met PT Goal: Stand to Sit - Progress: Met PT Goal: Ambulate - Progress: Progressing toward goal PT Goal: Up/Down Stairs - Progress: Progressing toward goal  PT Treatment Precautions/Restrictions  Precautions Precautions: Fall Precaution Comments: h/o falls, weakness, confusion Required Braces or Orthoses: No Restrictions Weight Bearing Restrictions: No Mobility (including Balance) Bed Mobility Bed Mobility: Yes Rolling Left: 6: Modified independent (Device/Increase time) Supine to Sit: 6: Modified independent (Device/Increase time) Sit to Supine: 6: Modified independent (Device/Increase time) Transfers Transfers: Yes Sit to Stand: From bed;From chair/3-in-1;From toilet;6: Modified independent (Device/Increase time);Without upper extremity assist Stand to Sit: Without upper extremity assist Ambulation/Gait Ambulation/Gait: Yes Ambulation/Gait Assistance Details (indicate cue type and reason): Pt required VC and demo to correct body posture during amb. Pt amb with flexed trunk and tends to look down during amb. Pt was educated on proper body posture during amb to decrease fall risk.  Ambulation Distance (Feet): 200 Feet Assistive device: Rolling walker Gait Pattern: Within Functional  Limits;Trunk flexed;Step-through pattern;Decreased stride length;Decreased dorsiflexion - left;Left flexed knee in stance;Decreased hip/knee flexion - left Stairs: Yes Stairs Assistance: 4: Min assist;Other (comment) (Minguard A) Stairs Assistance Details (indicate cue type and reason):   Encouraged pt to utilze rails during amb stairs to increase stability and decrease fall risk.    Stair Management Technique: One rail Right Number of Stairs: 15  Wheelchair Mobility Wheelchair Mobility: No    Exercise    End of Session PT - End of Session Equipment Utilized During Treatment: Gait belt Activity Tolerance: Patient tolerated treatment well Patient left: in bed;with call bell in reach Nurse Communication: Mobility status for ambulation General Behavior During Session: [facility] for tasks performed Cognition: [facility] for tasks performed  Tamera Stands 06/12/2011, 2:51 PM

## 2011-06-12 NOTE — Progress Notes (Signed)
06/12/2011 Shanard Treto Elizabeth PTA [phone] pager [phone] office    

## 2011-06-12 NOTE — Progress Notes (Signed)
Subjective: No complaints. Speech and gait continue to improve.  Objective: Current vital signs: BP 116/75  Pulse 58  Temp(Src) 97.9 F (36.6 C) (Oral)  Resp 18  Ht 5' 9.5" (1.765 m)  Wt 81.194 kg (179 lb)  BMI 26.05 kg/m2  SpO2 93%  Neurologic Exam: Alert; oriented x 3. Normal speech with no dysarthria.  EOM's normal with no nystagmus. Coordination normal.  Lab Results: Phenobarbital level for today is pending.  Medications:  Scheduled:   . atorvastatin  40 mg Oral q1800  . atropine  0.5 mg Intravenous Once  . calcium-vitamin D  1 tablet Oral Daily  . darifenacin  7.5 mg Oral Daily  . dipyridamole-aspirin  1 capsule Oral BID  . levETIRAcetam  1,000 mg Oral QHS  . levETIRAcetam  500 mg Oral Daily  . mulitivitamin with minerals  1 tablet Oral Daily  . sodium chloride  250 mL Intravenous Once  . sodium chloride  3 mL Intravenous Q12H    Assessment/Plan: Phenobarbital toxicity, resolving. Level today is pending.  Recommend discharge to home if gait is stable per P.T. Resume Phenobarbital tabs 97.2 mg at 1 1/2 tabs QHs when level is < 35.0. Repeat Phenobarb level on 06/13/2011 if > 35.0 today.  C.R. Roseanne Reno, MD [facility] [phone]  06/12/2011  8:20 AM

## 2011-08-11 ENCOUNTER — Encounter (HOSPITAL_COMMUNITY): Payer: Self-pay | Admitting: Emergency Medicine

## 2011-08-11 ENCOUNTER — Observation Stay (HOSPITAL_COMMUNITY)
Admission: EM | Admit: 2011-08-11 | Discharge: 2011-08-11 | Disposition: A | Discharge status: Home / Self Care | Payer: Medicare Other | Attending: Emergency Medicine | Admitting: Emergency Medicine

## 2011-08-11 ENCOUNTER — Emergency Department (HOSPITAL_COMMUNITY): Payer: Medicare Other

## 2011-08-11 ENCOUNTER — Observation Stay (HOSPITAL_COMMUNITY): Payer: Medicare Other

## 2011-08-11 DIAGNOSIS — G459 Transient cerebral ischemic attack, unspecified: Principal | ICD-10-CM | POA: Insufficient documentation

## 2011-08-11 DIAGNOSIS — R279 Unspecified lack of coordination: Secondary | ICD-10-CM | POA: Insufficient documentation

## 2011-08-11 DIAGNOSIS — R29898 Other symptoms and signs involving the musculoskeletal system: Secondary | ICD-10-CM | POA: Insufficient documentation

## 2011-08-11 DIAGNOSIS — G9389 Other specified disorders of brain: Secondary | ICD-10-CM | POA: Insufficient documentation

## 2011-08-11 DIAGNOSIS — I6789 Other cerebrovascular disease: Secondary | ICD-10-CM

## 2011-08-11 DIAGNOSIS — R4789 Other speech disturbances: Secondary | ICD-10-CM | POA: Insufficient documentation

## 2011-08-11 LAB — COMPREHENSIVE METABOLIC PANEL
ALT: 15 U/L (ref 0–53)
AST: 31 U/L (ref 0–37)
Albumin: 4 g/dL (ref 3.5–5.2)
CO2: 28 mEq/L (ref 19–32)
Calcium: 9.6 mg/dL (ref 8.4–10.5)
Chloride: 102 mEq/L (ref 96–112)
GFR calc non Af Amer: 73 mL/min — ABNORMAL LOW (ref >90)
Sodium: 138 mEq/L (ref 135–145)

## 2011-08-11 LAB — PROTIME-INR
INR: 1.07 (ref 0.00–1.49)
Prothrombin Time: 14.1 seconds (ref 11.6–15.2)

## 2011-08-11 LAB — POCT I-STAT 3, ART BLOOD GAS (G3+)
TCO2: 28 mmol/L (ref 0–100)
pCO2 arterial: 42.1 mmHg (ref 35.0–45.0)
pH, Arterial: 7.414 (ref 7.350–7.450)
pO2, Arterial: 74 mmHg — ABNORMAL LOW (ref 80.0–100.0)

## 2011-08-11 LAB — DIFFERENTIAL
Basophils Absolute: 0 10*3/uL (ref 0.0–0.1)
Basophils Relative: 0 % (ref 0–1)
Lymphocytes Relative: 22 % (ref 12–46)
Neutro Abs: 4.7 10*3/uL (ref 1.7–7.7)

## 2011-08-11 LAB — LIPID PANEL: Cholesterol: 165 mg/dL (ref 0–200)

## 2011-08-11 LAB — POCT I-STAT, CHEM 8
BUN: 23 mg/dL (ref 6–23)
Chloride: 102 mEq/L (ref 96–112)
HCT: 45 % (ref 39.0–52.0)
Sodium: 140 mEq/L (ref 135–145)

## 2011-08-11 LAB — URINALYSIS, ROUTINE W REFLEX MICROSCOPIC
Glucose, UA: NEGATIVE mg/dL
Leukocytes, UA: NEGATIVE
Nitrite: NEGATIVE
Protein, ur: NEGATIVE mg/dL
Urobilinogen, UA: 0.2 mg/dL (ref 0.0–1.0)

## 2011-08-11 LAB — CBC
Platelets: 220 10*3/uL (ref 150–400)
RDW: 13.8 % (ref 11.5–15.5)
WBC: 7.1 10*3/uL (ref 4.0–10.5)

## 2011-08-11 LAB — PHENOBARBITAL LEVEL: Phenobarbital: 24.1 ug/mL (ref 15.0–40.0)

## 2011-08-11 LAB — CK TOTAL AND CKMB (NOT AT ARMC): Relative Index: 2.7 — ABNORMAL HIGH (ref 0.0–2.5)

## 2011-08-11 LAB — HEMOGLOBIN A1C: Mean Plasma Glucose: 103 mg/dL (ref <117)

## 2011-08-11 LAB — APTT: aPTT: 33 seconds (ref 24–37)

## 2011-08-11 LAB — GLUCOSE, CAPILLARY: Glucose-Capillary: 93 mg/dL (ref 70–99)

## 2011-08-11 NOTE — Progress Notes (Signed)
*  PRELIMINARY RESULTS* Vascular Ultrasound Carotid Duplex (Doppler) has been completed.   No evidence of internal carotid artery stenosis bilaterally. Bilateral antegrade vertebral artery flow.  Malachy Moan, RDMS, RDCS 08/11/2011, 4:14 PM

## 2011-08-11 NOTE — Discharge Instructions (Signed)
Transient Ischemic Attack Take her Aggrenox and other medicines as prescribed. Followup with Dr. Pearlean Brownie next week. Return to ED if you develop new or worsening symptoms. A transient ischemic attack (TIA) is a "warning stroke" that causes stroke-like symptoms. Unlike a stroke, a TIA does not cause permanent damage to the brain. The symptoms of a TIA can happen very fast and do not last long. It is important to know the symptoms of a TIA and what to do. This can help prevent a major stroke or death. CAUSES   A TIA is caused by a temporary blockage in an artery in the brain or neck (carotid artery). The blockage does not allow the brain to get the blood supply it needs and can cause different symptoms. The blockage can be caused by either:   A blood clot.   Fatty buildup (plaque) in a neck or brain artery.  SYMPTOMS  TIA symptoms are the same as a stroke but are temporary. Symptoms can include sudden:  Numbness or weakness on one side of the body. Especially to the:   Face.   Arm.   Leg.   Trouble speaking, thinking, or confusion.   Change in vision, such as trouble seeing in one or both eyes.   Dizziness, loss of balance, or difficulty walking.   Severe headache.  ANY OF THESE SYMPTOMS MAY REPRESENT A SERIOUS PROBLEM THAT IS AN EMERGENCY. Do not wait to see if the symptoms will go away. Get medical help at once. Call your local emergency services (911 in [place] IMMEDIATELY. DO NOT drive yourself to the hospital. RISK FACTORS Risk factors can increase the risk of developing a TIA. These can include.   High blood pressure (hypertension).   High cholesterol (hyperlipidemia).   Heart disease (atherosclerosis).   Smoking.   Diabetes.   Abnormal heart rhythm (atrial fibrillation).   Family history of a stroke or heart attack.   Use of oral contraceptives (especially when combined with smoking).  DIAGNOSIS   A TIA can be diagnosed based on your:   Symptoms.   History.    Risk factors.   Tests that can help diagnose the symptoms of a TIA include:   CT or MRI scan. These tests can provide detailed images of the brain.   Carotid ultrasound. This test looks to see if there are blockages in the carotid arteries of your neck.   Arteriography. A thin, small flexible tube (catheter) is inserted through a small cut (incision) in your groin. The catheter is threaded to your carotid or vertebral artery. A dye is then injected into the catheter. The dye highlights the arteries in your brain and allows your caregiver to look for narrowing or blockages that can cause a TIA.  TREATMENT  Based on the cause of a TIA, treatment options can vary. Treatment is important to help prevent a stroke. Treatment options can include:  Medication. Such as:   Clot-busting medicine.   Anti-platelet medicine.   Blood pressure medicine.   Blood thinner medicine.   Surgery:   Carotid endarterectomy. The carotid arteries are the arteries that supply the head and neck with oxygenated blood. This surgery can help remove fatty deposits (plaque) in the carotid arteries.   Angioplasty and stenting. This surgery uses a balloon to dilate a blocked artery in the brain. A stent is a small, metal mesh tube that can help keep an artery open  HOME CARE INSTRUCTIONS   It is important to take all medicine as told by  your caregiver. If the medicine has side effects that affect you negatively, tell your caregiver right away. Do not stop taking medicine unless told by your caregiver. Some medicines may need to be changed to better treat your condition.   Do not smoke. Talk to your caregiver on how to quit smoking.   Eat a diet high in fruits, vegetables and lean meat. Avoid a high fat, high salt diet. A dietician can you help you make healthy food choices.   Maintain a healthy weight. Develop an exercise plan approved by your caregiver.  SEEK IMMEDIATE MEDICAL CARE IF:   You develop weakness  or numbness on one side of your body.   You have problems thinking, speaking, or feel confused.   You have vision changes.   You feel dizzy, have trouble walking, or lose your balance.   You develop a severe headache.  MAKE SURE YOU:   Understand these instructions.   Will watch your condition.   Will get help right away if you are not doing well or get worse.  Document Released: 11/29/2004 Document Revised: 02/08/2011 Document Reviewed: 04/14/2009 [facility] Patient Information 2012 [place], [place].

## 2011-08-11 NOTE — ED Notes (Signed)
Pt remains in MRI - spouse at bedside

## 2011-08-11 NOTE — ED Notes (Signed)
T. Stated, i was hanging clothes on a line and i felt lightheadness and i couldn't touch my finger to my nose, my speech was slurred.  I use to have seizures and seen a neurologist and this is different. Pt. alert and oriented, follow commands , speech according to pt was a little slurred, "it is a lot better now" bilateral equal grips, smile is symmetrical.

## 2011-08-11 NOTE — ED Provider Notes (Signed)
Patient remains in CDU TIA protocol. If MRI is negative. His echocardiogram shows no thrombotic source. His carotid Dopplers are negative. Patient has 5 out of 5 strength, no ataxia, no work finding difficulty, feels back to baseline. He is on Aggrenox already for previous strokes. Phenobarbital level therapeutic. D/w Dr. Thad Ranger of neurology.  Recommends continuing aggrenox and following up with Dr. Pearlean Brownie this week.  Rodney Octave, MD 08/12/11 [phone]

## 2011-08-11 NOTE — Progress Notes (Signed)
  Echocardiogram 2D Echocardiogram has been performed.  Rodney Potts 08/11/2011, 5:39 PM

## 2011-08-11 NOTE — ED Provider Notes (Signed)
History     CSN: [id number]  Arrival date & time 08/11/11  1336   First MD Initiated Contact with Patient 08/11/11 1403      Chief Complaint  Patient presents with  . Code Stroke    (Consider location/radiation/quality/duration/timing/severity/associated sxs/prior treatment) HPI Pt states he was hanging clothes out when he had sudden onset of slurred speech and dysmetria with L hand as chesked by wife. Symptoms have since resolved. Denies HA, weakness, numbness, fever, chills, CP, abd pain, N/V. Pt states he has had TIA's in the past.  Past Medical History  Diagnosis Date  . Brain tumor     glioma  . Cancer     PROSTATE CANCER  . TIA (transient ischemic attack)     APPROX N2 MONTHS AGO 01/2011  . Sleep apnea   . IBS (irritable bowel syndrome)   . Shingles   . Cataract   . Seizures   . CVA (cerebral infarction)     R thalamic  . Stroke     Past Surgical History  Procedure Date  . Eye surgery     CATARACT REMOVAL  . Joint replacement     BILATERAL KNEE REPLACEMENT  . Prostatectomy   . Palate surgery   . Shoulder arthroscopy     No family history on file.  History  Substance Use Topics  . Smoking status: Former Games developer  . Smokeless tobacco: Not on file  . Alcohol Use: 3.0 oz/week    5 Glasses of wine per week      Review of Systems  Constitutional: Negative for fever and chills.  HENT: Positive for voice change. Negative for neck pain.   Eyes: Negative for visual disturbance.  Respiratory: Negative for shortness of breath.   Cardiovascular: Negative for chest pain and leg swelling.  Gastrointestinal: Negative for nausea, vomiting and abdominal pain.  Neurological: Negative for dizziness, facial asymmetry, weakness, light-headedness, numbness and headaches.    Allergies  Amoxicillin; Dilaudid; Lactose intolerance (gi); and Sulfa antibiotics  Home Medications   Current Outpatient Rx  Name Route Sig Dispense Refill  . ATORVASTATIN CALCIUM 40 MG PO  TABS Oral Take 40 mg by mouth daily.    Marland Kitchen CALCIUM CARBONATE-VITAMIN D 500-200 MG-UNIT PO TABS Oral Take 1 tablet by mouth daily.    . CO Q 10 PO Oral Take 1 tablet by mouth daily.    . ASPIRIN-DIPYRIDAMOLE ER 25-200 MG PO CP12 Oral Take 1 capsule by mouth 2 (two) times daily. Take 1 capsule by mouth at bedtime daily until 04/08/2011. From 04/09/2011, take 1 capsule by mouth 2 times daily.    Marland Kitchen LEVETIRACETAM 1000 MG PO TABS Oral Take 500-1,000 mg by mouth 2 (two) times daily. 500 mg in the am and 1000 mg at bedtime.    . ADULT MULTIVITAMIN W/MINERALS CH Oral Take 1 tablet by mouth daily.    Marland Kitchen PHENOBARBITAL 97.2 MG PO TABS Oral Take 145.8 mg by mouth at bedtime.    . SOLIFENACIN SUCCINATE 10 MG PO TABS Oral Take 10 mg by mouth daily.      BP 113/74  Pulse 68  Temp(Src) 97.7 F (36.5 C) (Oral)  Resp 16  SpO2 97%  Physical Exam  Nursing note and vitals reviewed. Constitutional: He is oriented to person, place, and time. He appears well-developed and well-nourished. No distress.  HENT:  Head: Normocephalic and atraumatic.  Mouth/Throat: Oropharynx is clear and moist.  Eyes: EOM are normal. Pupils are equal, round, and reactive to light.  Neck: Normal range of motion. Neck supple.  Cardiovascular: Normal rate and regular rhythm.   Pulmonary/Chest: Effort normal and breath sounds normal. No respiratory distress. He has no wheezes. He has no rales.  Abdominal: Soft. Bowel sounds are normal. There is no tenderness. There is no rebound and no guarding.  Musculoskeletal: Normal range of motion. He exhibits no edema and no tenderness.  Neurological: He is alert and oriented to person, place, and time.       5/5 motor in all ext, no drift. Sensation fully intact. Speech is at baseline per wife. No facial droop, Cn II-XII intact. Finger to nose intact bl.   Skin: Skin is warm and dry. No rash noted. No erythema.  Psychiatric: He has a normal mood and affect. His behavior is normal.    ED Course    Procedures (including critical care time)  Labs Reviewed  COMPREHENSIVE METABOLIC PANEL - Abnormal; Notable for the following:    Potassium 5.3 (*) SLIGHT HEMOLYSIS   Glucose, Bld 101 (*)    GFR calc non Af Amer 73 (*)    GFR calc Af Amer 85 (*)    All other components within normal limits  CK TOTAL AND CKMB - Abnormal; Notable for the following:    Relative Index 2.7 (*)    All other components within normal limits  POCT I-STAT 3, BLOOD GAS (G3+) - Abnormal; Notable for the following:    pO2, Arterial 74.0 (*)    Bicarbonate 26.9 (*)    All other components within normal limits  PROTIME-INR  APTT  CBC  DIFFERENTIAL  TROPONIN I  GLUCOSE, CAPILLARY  URINALYSIS, ROUTINE W REFLEX MICROSCOPIC  LIPID PANEL  HEMOGLOBIN A1C   Ct Head Wo Contrast  08/11/2011  *RADIOLOGY REPORT*  Clinical Data: Left-sided weakness and slurred speech. History of brain tumor with radiation and chemotherapy.  CT HEAD WITHOUT CONTRAST  Technique:  Contiguous axial images were obtained from the base of the skull through the vertex without contrast.  Comparison: MRI brain 06/09/2011 and CT head 06/09/2011  Findings: Stable cerebral atrophy, chronic bilateral microvascular ischemic changes.  A focal calcification in the right frontal lobe with adjacent encephalomalacia is stable.  Focal hypodensity in the right thalamus is stable.  Ventricular size is stable, commensurate with the degree of atrophy.  Negative for hemorrhage, mass effect, mass lesion, or evidence of acute cortically based infarction.  The visualized paranasal sinuses, mastoid air cells, and middle ears are clear.The skull is intact.  IMPRESSION:  1.  No acute intracranial abnormality identified. 2.  Chronic small vessel ischemic changes, brain atrophy, and chronic right frontal lobe encephalomalacia and focal calcification.  Report discussed by telephone with Dr. Ranae Palms at 2 o'clock p.m. 08/11/2011.  Original Report Authenticated By: Britta Mccreedy, M.D.      1. TIA (transient ischemic attack)      Date: 08/11/2011  Rate: 52  Rhythm: sinus brady   QRS Axis: normal  Intervals: normal  ST/T Wave abnormalities: normal  Conduction Disutrbances:none  Narrative Interpretation:   Old EKG Reviewed: unchanged    MDM  Code stroke cancelled due to complete return to neurologic baseline.   Remains asymptomatic. No changes to CT head. Will place on CDU TIA protocol and move to CDU. Pt aware of plan.        Loren Racer, MD 08/11/11 1536

## 2011-08-11 NOTE — ED Notes (Signed)
Pt to MRI and will be taken to CDU 1.  Pt's wife transferred to CDU to wait for pt.

## 2011-09-12 ENCOUNTER — Other Ambulatory Visit: Payer: Self-pay | Admitting: Gastroenterology

## 2011-09-21 ENCOUNTER — Encounter (HOSPITAL_COMMUNITY): Payer: Self-pay

## 2011-09-21 ENCOUNTER — Emergency Department (HOSPITAL_COMMUNITY)
Admission: EM | Admit: 2011-09-21 | Discharge: 2011-09-21 | Disposition: A | Discharge status: Home / Self Care | Payer: Medicare Other | Attending: Emergency Medicine | Admitting: Emergency Medicine

## 2011-09-21 ENCOUNTER — Emergency Department (HOSPITAL_COMMUNITY): Payer: Medicare Other

## 2011-09-21 DIAGNOSIS — R4789 Other speech disturbances: Secondary | ICD-10-CM

## 2011-09-21 DIAGNOSIS — R5381 Other malaise: Secondary | ICD-10-CM | POA: Insufficient documentation

## 2011-09-21 DIAGNOSIS — R4781 Slurred speech: Secondary | ICD-10-CM

## 2011-09-21 DIAGNOSIS — Z8673 Personal history of transient ischemic attack (TIA), and cerebral infarction without residual deficits: Secondary | ICD-10-CM | POA: Insufficient documentation

## 2011-09-21 DIAGNOSIS — Z87891 Personal history of nicotine dependence: Secondary | ICD-10-CM | POA: Insufficient documentation

## 2011-09-21 DIAGNOSIS — Z8546 Personal history of malignant neoplasm of prostate: Secondary | ICD-10-CM | POA: Insufficient documentation

## 2011-09-21 DIAGNOSIS — G459 Transient cerebral ischemic attack, unspecified: Secondary | ICD-10-CM

## 2011-09-21 DIAGNOSIS — R471 Dysarthria and anarthria: Secondary | ICD-10-CM | POA: Insufficient documentation

## 2011-09-21 DIAGNOSIS — G4730 Sleep apnea, unspecified: Secondary | ICD-10-CM | POA: Insufficient documentation

## 2011-09-21 LAB — CBC
MCHC: 32.7 g/dL (ref 30.0–36.0)
MCV: 95.9 fL (ref 78.0–100.0)
Platelets: 220 10*3/uL (ref 150–400)
RDW: 14 % (ref 11.5–15.5)
WBC: 5.4 10*3/uL (ref 4.0–10.5)

## 2011-09-21 LAB — DIFFERENTIAL
Basophils Absolute: 0 10*3/uL (ref 0.0–0.1)
Basophils Relative: 0 % (ref 0–1)
Eosinophils Relative: 2 % (ref 0–5)
Lymphocytes Relative: 20 % (ref 12–46)
Neutro Abs: 3.5 10*3/uL (ref 1.7–7.7)

## 2011-09-21 LAB — COMPREHENSIVE METABOLIC PANEL
ALT: 12 U/L (ref 0–53)
AST: 22 U/L (ref 0–37)
Albumin: 4.3 g/dL (ref 3.5–5.2)
CO2: 27 mEq/L (ref 19–32)
Calcium: 9.7 mg/dL (ref 8.4–10.5)
Creatinine, Ser: 1.01 mg/dL (ref 0.50–1.35)
GFR calc non Af Amer: 76 mL/min — ABNORMAL LOW (ref >90)
Sodium: 142 mEq/L (ref 135–145)

## 2011-09-21 LAB — CK TOTAL AND CKMB (NOT AT ARMC)
CK, MB: 3.1 ng/mL (ref 0.3–4.0)
Relative Index: 2.5 (ref 0.0–2.5)

## 2011-09-21 LAB — TROPONIN I: Troponin I: <0.3 ng/mL (ref <0.30)

## 2011-09-21 LAB — APTT: aPTT: 33 seconds (ref 24–37)

## 2011-09-21 LAB — GLUCOSE, CAPILLARY: Glucose-Capillary: 82 mg/dL (ref 70–99)

## 2011-09-21 LAB — PROTIME-INR: Prothrombin Time: 13.6 seconds (ref 11.6–15.2)

## 2011-09-21 MED ORDER — CLOPIDOGREL BISULFATE 75 MG PO TABS: 75.0000 mg | ORAL_TABLET | ORAL | Status: AC

## 2011-09-21 MED ORDER — CLOPIDOGREL BISULFATE 75 MG PO TABS
75.0000 mg | ORAL_TABLET | ORAL | Status: AC
  Administered 2011-09-21: 75 mg via ORAL
  Filled 2011-09-21: qty 1

## 2011-09-21 NOTE — ED Provider Notes (Signed)
History     CSN: [id number]  Arrival date & time 09/21/11  1103   First MD Initiated Contact with Patient 09/21/11 1121      Chief Complaint  Patient presents with  . Transient Ischemic Attack    HPI The patient presents after an episode of dysarthria and unilateral weakness.  Notably, the patient was seen here 2 days ago for similar complaints. Had an MRI that did not show acutefindings at that time.  He was discharged with Aggrenox.  He notes that he was in his usual state of health until today.  After spending some time out doors, doing yardwork, the patient felt lightheaded, had slurred speech, witnessed by his wife.  He also complained of new left upper extremity weakness.  These symptoms lasted approximately 20 minutes, resolved spontaneously.  There was no pain during the episode.  On resolution the patient had no complaints.    Past Medical History  Diagnosis Date  . Brain tumor     glioma  . Cancer     PROSTATE CANCER  . TIA (transient ischemic attack)     APPROX N2 MONTHS AGO 01/2011  . Sleep apnea   . IBS (irritable bowel syndrome)   . Shingles   . Cataract   . Seizures   . CVA (cerebral infarction)     R thalamic  . Stroke     Past Surgical History  Procedure Date  . Eye surgery     CATARACT REMOVAL  . Joint replacement     BILATERAL KNEE REPLACEMENT  . Prostatectomy   . Palate surgery   . Shoulder arthroscopy     No family history on file.  History  Substance Use Topics  . Smoking status: Former Games developer  . Smokeless tobacco: Not on file  . Alcohol Use: 3.0 oz/week    5 Glasses of wine per week      Review of Systems  Constitutional:       Per HPI, otherwise negative  HENT:       Per HPI, otherwise negative  Eyes: Negative.   Respiratory:       Per HPI, otherwise negative  Cardiovascular:       Per HPI, otherwise negative  Gastrointestinal: Negative for vomiting.  Genitourinary: Negative.   Musculoskeletal:       Per HPI, otherwise  negative  Skin: Negative.   Neurological: Negative for syncope.    Allergies  Amoxicillin; Dilaudid; Lactose intolerance (gi); and Sulfa antibiotics  Home Medications   Current Outpatient Rx  Name Route Sig Dispense Refill  . ATORVASTATIN CALCIUM 40 MG PO TABS Oral Take 40 mg by mouth daily.    Marland Kitchen CALCIUM CARBONATE-VITAMIN D 500-200 MG-UNIT PO TABS Oral Take 1 tablet by mouth daily.    . ASPIRIN-DIPYRIDAMOLE ER 25-200 MG PO CP12 Oral Take 1 capsule by mouth 2 (two) times daily. Take 1 capsule by mouth at bedtime daily until 04/08/2011. From 04/09/2011, take 1 capsule by mouth 2 times daily.    Marland Kitchen LEVETIRACETAM 1000 MG PO TABS Oral Take 500-1,000 mg by mouth 2 (two) times daily. 500 mg in the am and 1000 mg at bedtime.    Marland Kitchen PHENOBARBITAL 97.2 MG PO TABS Oral Take 145.8 mg by mouth at bedtime.    . SOLIFENACIN SUCCINATE 10 MG PO TABS Oral Take 10 mg by mouth daily.      BP 103/66  Pulse 63  Temp 98.2 F (36.8 C) (Oral)  Resp 20  SpO2 94%  Physical Exam  Nursing note and vitals reviewed. Constitutional: He is oriented to person, place, and time. He appears well-developed and well-nourished. No distress.  HENT:  Head: Normocephalic and atraumatic.  Mouth/Throat: Oropharynx is clear and moist.  Eyes: EOM are normal. Pupils are equal, round, and reactive to light.  Neck: Normal range of motion. Neck supple.  Cardiovascular: Normal rate and regular rhythm.   Pulmonary/Chest: Effort normal and breath sounds normal. No respiratory distress. He has no wheezes. He has no rales.  Abdominal: Soft. Bowel sounds are normal. There is no tenderness. There is no rebound and no guarding.  Musculoskeletal: Normal range of motion. He exhibits no edema and no tenderness.  Neurological: He is alert and oriented to person, place, and time.       5/5 motor in all ext, no drift. Sensation fully intact. Speech is at baseline per wife. No facial droop, Cn II-XII intact. Finger to nose intact bl.   Skin: Skin  is warm and dry. No rash noted. No erythema.  Psychiatric: He has a normal mood and affect. His behavior is normal.    ED Course  Procedures (including critical care time)  Labs Reviewed  DIFFERENTIAL - Abnormal; Notable for the following:    Monocytes Relative 13 (*)     All other components within normal limits  COMPREHENSIVE METABOLIC PANEL - Abnormal; Notable for the following:    GFR calc non Af Amer 76 (*)     GFR calc Af Amer 88 (*)     All other components within normal limits  PROTIME-INR  APTT  CBC  CK TOTAL AND CKMB  TROPONIN I  GLUCOSE, CAPILLARY  PHENOBARBITAL LEVEL   Ct Head Wo Contrast  09/21/2011  *RADIOLOGY REPORT*  Clinical Data: Slurred speech.  Left side weakness.  CT HEAD WITHOUT CONTRAST  Technique:  Contiguous axial images were obtained from the base of the skull through the vertex without contrast.  Comparison: Head CT and brain MRI 08/11/2011.  Findings: The brain is atrophic with extensive hypoattenuation in the subcortical periventricular deep white matter.  Small, focal calcification in the right frontal lobe is again identified with surrounding encephalomalacia is unchanged.  No evidence of acute abnormality including infarct, hemorrhage, mass, mass effect, midline shift or abnormal extra-axial fluid collection.  No hydrocephalus or pneumocephalus.  IMPRESSION: No acute finding with extensive chronic microvascular ischemic change and atrophy again seen. Right frontal lobe calcification and encephalomalacia also noted.  Stable compared to prior exam.  Original Report Authenticated By: Bernadene Bell. Maricela Curet, M.D.     No diagnosis found.   cardiac: 55sb, abnormal  Pulse ox 99%ra, normal   Date: 09/21/2011  Rate: 53  Rhythm: normal sinus rhythm  QRS Axis: left  Intervals: PR prolonged  ST/T Wave abnormalities: normal  Conduction Disutrbances:first-degree A-V block   Narrative Interpretation:   Old EKG Reviewed: unchanged ABNORMAL  On re-eval the  patient continued to have no neuro complaints.  MDM  This elderly M w Hx of brain tumor in the distant past now p/w Sx c/w TIA.  Given the patient's endorsement of medication compliance, there is some suspicion for dehydration related diaskesis vs. TIA.  With resolution of Sx, reassuring labs / CT I obtained a neurology consult.  The patient was loaded with plavix.  Per Dr. Thad Ranger, the patient will be d/c w outpatient F/U, initiation of Plavix, cessation of Aggrenox.  Gerhard Munch, MD 09/21/11 [phone]

## 2011-09-21 NOTE — ED Notes (Signed)
Pt states he had an episode of slurred speech and weakness in his left side dizziness. States that some of s/s have resloved but he has hx of brain tumor in 18 with chemo and also has hx of sz which he states has not had one in years

## 2011-09-21 NOTE — Consult Note (Signed)
[facility] STROKE CONSULT [facility]       Chief Complaint: TIA   HPI:    Rodney Potts is an 66 y.o. male who was seen on 08/11/11 at [facility] [place] for sudden onset of slurred speech and left hand dysmetria.  At that time his symptoms resolved prior to arriving to the ED. He has a history of brain tumor, right thalamic CVA and seizures. Patients home medications include Aggrenox, Lipitor and Keppra. During that visit his 2D echo showed 55-60% EF without PFO, carotid dopplers showed no ICA stenosis, HbA1C was 5.8, LDL was 80, MRI and CT head showed no acute stroke. Patient was out in his yard working with wife today around 10 AM when he noted left facial weakness, dysarthria and left arm and hand weakness. By time he arrived in the ED all his symptoms resolved.   Patient does have a seizure history. 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  Will at times just have auras ("feeling strange").  Last seizure was multiple years ago per wife.  LSN: 10 AM tPA Given: No: symptoms resolved    Past Medical History  Diagnosis Date  . 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 Cancer     PROSTATE CANCER  . TIA (transient ischemic attack)     APPROX N2 MONTHS AGO 01/2011  . Sleep apnea   . IBS (irritable bowel syndrome)   . Shingles   . Cataract   . Seizures   . CVA (cerebral infarction)     R thalamic  . Stroke     Past Surgical History  Procedure Date  . Eye surgery     CATARACT REMOVAL  . Joint replacement     BILATERAL KNEE REPLACEMENT  . Prostatectomy   . Palate surgery   . Shoulder arthroscopy     No family history on file. Social History:  reports that he has quit smoking. He does not have any smokeless tobacco history on file. He reports that he drinks about 3 ounces of alcohol per week. His drug history not on file.  Allergies:  Allergies  Allergen Reactions  . Amoxicillin Diarrhea  . Dilaudid  (Hydromorphone Hcl) Other (See Comments)    DELUSIONS  . Lactose Intolerance (Gi) Diarrhea  . Sulfa Antibiotics Other (See Comments)    Cant control bladder    Medications:    No current facility-administered medications for this encounter.   Current Outpatient Prescriptions  Medication Sig Dispense Refill  . atorvastatin (LIPITOR) 40 MG tablet Take 40 mg by mouth daily.      . calcium-vitamin D (OSCAL WITH D) 500-200 MG-UNIT per tablet Take 1 tablet by mouth daily.      Marland Kitchen dipyridamole-aspirin (AGGRENOX) 25-200 MG per 12 hr capsule Take 1 capsule by mouth 2 (two) times daily. Take 1 capsule by mouth at bedtime daily until 04/08/2011. From 04/09/2011, take 1 capsule by mouth 2 times daily.      Marland Kitchen levETIRAcetam (KEPPRA) 1000 MG tablet Take 500-1,000 mg by mouth 2 (two) times daily. 500 mg in the am and 1000 mg at bedtime.      Marland Kitchen PHENobarbital (LUMINAL) 97.2 MG tablet Take 145.8 mg by mouth at bedtime.      . solifenacin (VESICARE) 10 MG tablet Take 10 mg by mouth daily.        ROS: History obtained from  the patient  General ROS: negative for - chills, fatigue, fever, night sweats, weight gain or weight loss Psychological ROS: negative for - behavioral disorder, hallucinations, memory difficulties, mood swings or suicidal ideation Ophthalmic ROS: negative for - blurry vision, double vision, eye pain or loss of vision ENT ROS: negative for - epistaxis, nasal discharge, oral lesions, sore throat, tinnitus or vertigo Allergy and Immunology ROS: negative for - hives or itchy/watery eyes Hematological and Lymphatic ROS: negative for - bleeding problems, bruising or swollen lymph nodes Endocrine ROS: negative for - galactorrhea, hair pattern changes, polydipsia/polyuria or temperature intolerance Respiratory ROS: negative for - cough, hemoptysis, shortness of breath or wheezing Cardiovascular ROS: negative for - chest pain, dyspnea on exertion, edema or irregular heartbeat Gastrointestinal ROS:  negative for - abdominal pain, diarrhea, hematemesis, nausea/vomiting or stool incontinence Genito-Urinary ROS: negative for - dysuria, hematuria, incontinence or urinary frequency/urgency Musculoskeletal ROS: negative for - joint swelling or muscular weakness Neurological ROS: as noted in HPI Dermatological ROS: negative for rash and skin lesion changes   Physical Examination: Blood pressure 99/60, pulse 63, temperature 98.2 F (36.8 C), temperature source Oral, resp. rate 18, SpO2 96.00%.  Neurologic Examination:   Mental Status: Alert, oriented, thought content appropriate.  Speech fluent without evidence of aphasia.  Able to follow 3 step commands without difficulty. Cranial Nerves: II: visual fields grossly normal, pupils equal, round, reactive to light and accommodation III,IV, VI: ptosis not present, extraocular muscles extra-ocular motions intact bilaterally with some jerky eye movements when following my finger V,VII: decrease in left NLF.  Facial light touch sensation normal bilaterally VIII: hearing normal bilaterally IX,X: gag reflex present XI: trapezius strength/neck flexion strength normal bilaterally XII: tongue strength normal  Motor: Right : Upper extremity    Left:     Upper extremity 5/5 deltoid       5/5 deltoid 5/5 tricep      5/5 tricep 5/5 biceps      5/5 biceps  5/5wrist flexion     5/5 wrist flexion 5/5 wrist extension     5/5 wrist extension 5/5 hand grip      5/5 hand grip  Lower extremity     Lower extremity 5/5 hip flexor      5/5 hip flexor 5/5 hip adductors     5/5 hip adductors 5/5 hip abductors     5/5 hip abductors 5/5 quadricep      5/5 quadriceps  5/5 hamstrings     5/5 hamstrings 5/5 plantar flexion       5/5 plantar flexion 5/5 plantar extension     5/5 plantar extension Tone and bulk:normal tone throughout; no atrophy noted Sensory: Pinprick and light touch intact throughout, bilaterally Deep Tendon Reflexes: 2+ UE bilaterally, depressed  KJ and AJ bilaterally.  Plantars: Right: mute  Left: mute Cerebellar: normal finger-to-nose,  normal heel-to-shin test   Lab Results  Component Value Date/Time   CHOL 165 08/11/2011  1:57 PM   Ct Head Wo Contrast  09/21/2011  *RADIOLOGY REPORT*  Clinical Data: Slurred speech.  Left side weakness.  CT HEAD WITHOUT CONTRAST  Technique:  Contiguous axial images were obtained from the base of the skull through the vertex without contrast.  Comparison: Head CT and brain MRI 08/11/2011.  Findings: The brain is atrophic with extensive hypoattenuation in the subcortical periventricular deep white matter.  Small, focal calcification in the right frontal lobe is again identified with surrounding encephalomalacia is unchanged.  No evidence of acute abnormality including infarct, hemorrhage,  mass, mass effect, midline shift or abnormal extra-axial fluid collection.  No hydrocephalus or pneumocephalus.  IMPRESSION: No acute finding with extensive chronic microvascular ischemic change and atrophy again seen. Right frontal lobe calcification and encephalomalacia also noted.  Stable compared to prior exam.  Original Report Authenticated By: Bernadene Bell. D'ALESSIO, M.D.   EKG: NSR  Assessment:    66 y.o. male with transient left facial droop, left arm weakness and dysarthria lasting for about 20 minutes which has fully resolved.  Patient has recently been seen in the ED one month ago with similar symptoms and negative stroke/TIA work-up.  Current symptoms likely TIA and small vessel in origin. Given patient has now been on Aggrenox for 7 months and has had two episodes of transient left sided weakness, facial asymmetry and dysarthria would recommend changing antiplatelet therapy.    Stroke Risk Factors - hyperlipidemia and recentTIA  PLAN:  1) Patient has had his dose of Aggrenox this morning.  To switch over to Plavix we recommend giving one dose of Plavix 75 mg today, Two tablets of Plavix tomorrow (total of 150  mg) and then single dose of Plavix daily thereafter. 2) Follow up with primary Neurologist (Dr. Pearlean Brownie) on Friday 7/26 at 2:30PM    3) Patient may be discharged from the ED.  Advised to return if symptoms recur.    Felicie Morn PA-C [facility] [phone]  09/21/2011, 1:40 PM  Patient seen and examined.  Clinical course and management discussed.  Necessary edits performed.  I agree with the above.  Thana Farr, MD [facility] [phone]  09/21/2011  4:24 PM

## 2011-09-21 NOTE — ED Notes (Signed)
To ct

## 2011-09-21 NOTE — ED Notes (Signed)
Pt here for sudden onset slurred speech onset 1 hour ago. Symptoms now resolved, pt has no other neurological deficits.

## 2012-01-02 ENCOUNTER — Other Ambulatory Visit: Payer: Self-pay | Admitting: Dermatology

## 2012-02-07 ENCOUNTER — Other Ambulatory Visit: Payer: Self-pay | Admitting: Dermatology

## 2012-06-03 ENCOUNTER — Other Ambulatory Visit: Payer: Self-pay | Admitting: Dermatology

## 2012-06-07 ENCOUNTER — Inpatient Hospital Stay (HOSPITAL_COMMUNITY)
Admission: EM | Admit: 2012-06-07 | Discharge: 2012-06-09 | DRG: 065 | Disposition: A | Discharge status: Home Health | Payer: Medicare Other | Attending: Internal Medicine | Admitting: Internal Medicine

## 2012-06-07 ENCOUNTER — Encounter (HOSPITAL_COMMUNITY): Payer: Self-pay | Admitting: Emergency Medicine

## 2012-06-07 ENCOUNTER — Emergency Department (HOSPITAL_COMMUNITY): Payer: Medicare Other

## 2012-06-07 DIAGNOSIS — G459 Transient cerebral ischemic attack, unspecified: Secondary | ICD-10-CM

## 2012-06-07 DIAGNOSIS — Z881 Allergy status to other antibiotic agents status: Secondary | ICD-10-CM

## 2012-06-07 DIAGNOSIS — R279 Unspecified lack of coordination: Secondary | ICD-10-CM | POA: Diagnosis present

## 2012-06-07 DIAGNOSIS — Z8546 Personal history of malignant neoplasm of prostate: Secondary | ICD-10-CM

## 2012-06-07 DIAGNOSIS — Z8249 Family history of ischemic heart disease and other diseases of the circulatory system: Secondary | ICD-10-CM

## 2012-06-07 DIAGNOSIS — R269 Unspecified abnormalities of gait and mobility: Secondary | ICD-10-CM | POA: Diagnosis present

## 2012-06-07 DIAGNOSIS — R29898 Other symptoms and signs involving the musculoskeletal system: Secondary | ICD-10-CM | POA: Diagnosis present

## 2012-06-07 DIAGNOSIS — R4781 Slurred speech: Secondary | ICD-10-CM

## 2012-06-07 DIAGNOSIS — C719 Malignant neoplasm of brain, unspecified: Secondary | ICD-10-CM

## 2012-06-07 DIAGNOSIS — Z85841 Personal history of malignant neoplasm of brain: Secondary | ICD-10-CM

## 2012-06-07 DIAGNOSIS — Z923 Personal history of irradiation: Secondary | ICD-10-CM

## 2012-06-07 DIAGNOSIS — I639 Cerebral infarction, unspecified: Secondary | ICD-10-CM | POA: Diagnosis present

## 2012-06-07 DIAGNOSIS — Z9221 Personal history of antineoplastic chemotherapy: Secondary | ICD-10-CM

## 2012-06-07 DIAGNOSIS — R531 Weakness: Secondary | ICD-10-CM

## 2012-06-07 DIAGNOSIS — Z7902 Long term (current) use of antithrombotics/antiplatelets: Secondary | ICD-10-CM

## 2012-06-07 DIAGNOSIS — Z96659 Presence of unspecified artificial knee joint: Secondary | ICD-10-CM

## 2012-06-07 DIAGNOSIS — K589 Irritable bowel syndrome without diarrhea: Secondary | ICD-10-CM | POA: Diagnosis present

## 2012-06-07 DIAGNOSIS — R569 Unspecified convulsions: Secondary | ICD-10-CM | POA: Diagnosis present

## 2012-06-07 DIAGNOSIS — I6330 Cerebral infarction due to thrombosis of unspecified cerebral artery: Principal | ICD-10-CM | POA: Diagnosis present

## 2012-06-07 DIAGNOSIS — Z87891 Personal history of nicotine dependence: Secondary | ICD-10-CM

## 2012-06-07 DIAGNOSIS — G4733 Obstructive sleep apnea (adult) (pediatric): Secondary | ICD-10-CM | POA: Diagnosis present

## 2012-06-07 DIAGNOSIS — R4789 Other speech disturbances: Secondary | ICD-10-CM | POA: Diagnosis present

## 2012-06-07 DIAGNOSIS — F05 Delirium due to known physiological condition: Secondary | ICD-10-CM | POA: Diagnosis present

## 2012-06-07 DIAGNOSIS — Z882 Allergy status to sulfonamides status: Secondary | ICD-10-CM

## 2012-06-07 DIAGNOSIS — Z888 Allergy status to other drugs, medicaments and biological substances status: Secondary | ICD-10-CM

## 2012-06-07 DIAGNOSIS — R270 Ataxia, unspecified: Secondary | ICD-10-CM | POA: Diagnosis present

## 2012-06-07 DIAGNOSIS — Z79899 Other long term (current) drug therapy: Secondary | ICD-10-CM

## 2012-06-07 DIAGNOSIS — Z8673 Personal history of transient ischemic attack (TIA), and cerebral infarction without residual deficits: Secondary | ICD-10-CM

## 2012-06-07 DIAGNOSIS — Z823 Family history of stroke: Secondary | ICD-10-CM

## 2012-06-07 DIAGNOSIS — E785 Hyperlipidemia, unspecified: Secondary | ICD-10-CM | POA: Diagnosis present

## 2012-06-07 LAB — COMPREHENSIVE METABOLIC PANEL
ALT: 16 U/L (ref 0–53)
AST: 24 U/L (ref 0–37)
Calcium: 9.7 mg/dL (ref 8.4–10.5)
GFR calc Af Amer: 72 mL/min — ABNORMAL LOW (ref >90)
Sodium: 140 mEq/L (ref 135–145)
Total Protein: 7.2 g/dL (ref 6.0–8.3)

## 2012-06-07 LAB — PHENOBARBITAL LEVEL: Phenobarbital: 23.5 ug/mL (ref 15.0–40.0)

## 2012-06-07 LAB — URINALYSIS, ROUTINE W REFLEX MICROSCOPIC
Glucose, UA: NEGATIVE mg/dL
Leukocytes, UA: NEGATIVE
Nitrite: NEGATIVE
Protein, ur: NEGATIVE mg/dL
pH: 6 (ref 5.0–8.0)

## 2012-06-07 LAB — CBC WITH DIFFERENTIAL/PLATELET
Basophils Absolute: 0 10*3/uL (ref 0.0–0.1)
Eosinophils Absolute: 0.2 10*3/uL (ref 0.0–0.7)
Eosinophils Relative: 3 % (ref 0–5)
MCH: 31.8 pg (ref 26.0–34.0)
MCHC: 34 g/dL (ref 30.0–36.0)
MCV: 93.6 fL (ref 78.0–100.0)
Platelets: 202 10*3/uL (ref 150–400)
RDW: 13.6 % (ref 11.5–15.5)

## 2012-06-07 MED ORDER — PHENOBARBITAL 32.4 MG PO TABS
145.8000 mg | ORAL_TABLET | Freq: Every day | ORAL | Status: DC
  Administered 2012-06-08 (×2): 145.8 mg via ORAL
  Filled 2012-06-07 (×2): qty 5

## 2012-06-07 NOTE — ED Notes (Signed)
PT. REPORTS FELL LAST Monday AT HOME SEEN BY PCP WITH RIGHT RIB INJURY PRESCRIBED WITH HYDROCODONE , SPOUSE REPORTS PROGRESSING GENERALIZED WEAKNESS / UNSTEADY GAIT WITH OCCASIONAL CONFUSION .

## 2012-06-07 NOTE — Consult Note (Signed)
Reason for Consult: Difficulty walking Referring Physician: Chaney Malling  CC: Difficulty walking  History is obtained from: Patient, wife, sister  HPI: Rodney Potts is a 67 y.o. male with a history of glioma treated in 1982. At that time he did have radiation. He also has a history of prostate cancer. He has a history of multiple TIAs as well as previous right thalamic stroke.  Over the past several days, he has had progressive difficulty walking and confusion. He initially fell last Wednesday, though it is not clear that time that his gait was any worse than normal. He was given hydrocodone due to a broken rib that was being exacerbated by a chronic cough. Over the next few days he had progressive confusion as well as more difficulty walking.   ROS: A 14 point ROS was performed and is negative except as noted in the HPI.  Past Medical History  Diagnosis Date  . Brain tumor     glioma  . Cancer     PROSTATE CANCER  . TIA (transient ischemic attack)     APPROX N2 MONTHS AGO 01/2011  . Sleep apnea   . IBS (irritable bowel syndrome)   . Shingles   . Cataract   . Seizures   . CVA (cerebral infarction)     R thalamic  . Stroke     Family History: No hx sz Mother - TIAs  Social History: Tob: none  Exam: Current vital signs: BP 143/81  Pulse 62  Temp(Src) 98.2 F (36.8 C) (Oral)  Resp 19  SpO2 97% Vital signs in last 24 hours: Temp:  [98.2 F (36.8 C)] 98.2 F (36.8 C) (04/05 1902) Pulse Rate:  [57-62] 62 (04/05 2115) Resp:  [13-19] 19 (04/05 2115) BP: (127-145)/(81-87) 143/81 mmHg (04/05 2115) SpO2:  [94 %-98 %] 97 % (04/05 2115)  General: in bed, NAD CV: RRR Mental Status: Patient is awake, alert, oriented to person, place, month, year, and situation. He has some mild motor perseveration and has to be redirected on tasks several times.  Cranial Nerves: II: Visual Fields are full. Pupils are equal, round, and reactive to light.  Discs are not well  visualized. III,IV, VI: EOMI without ptosis or diploplia. He has a saccadic pursuit.  V: Facial sensation is symmetric to temperature VII: Facial movement is symmetric.  VIII: hearing is intact to voice X: Uvula elevates symmetrically XI: Shoulder shrug is symmetric. XII: tongue is midline without atrophy or fasciculations.  Motor: Tone is normal. Bulk is normal. 5/5 strength was present in all four extremities.  Sensory: Sensation is diminished to pin and temperature in left arm.  Deep Tendon Reflexes: 2+ and symmetric in the biceps and patellae.  Cerebellar: FNF with mild tremor on right, left has slightly more pronounced difficulty with FNF, though not clear dysmetria.  Gait: When stood, the patient is unable to maintain upright posture.    I have reviewed labs in epic and the results pertinent to this consultation are: CMP, CBC unremarkable  I have reviewed the images obtained:CT head - no acute findings  Impression: 67 yo M with confusion and worsening gait difficulty over the past few days. A stroke typically would be more acute in onset, but this may represent a mild delirium in the setting of recent stroke and given his new sensory findings(though may be sequelae of previous thalmic stroke) and consistent falls to the left, would need to rule this out with MRI. Also would evaluate for infectious sources of  possible mild delirium.   Recommendations: 1) Phenobarbital level, would not alter dose tonight unless level returns and is high.  2) MRI brain 3) UA, CXR to rule  4) Would hold narcotic medications.    Ritta Slot, MD [facility] [phone]  If 7pm- 7am, please page neurology on call at [phone].

## 2012-06-07 NOTE — ED Notes (Signed)
Pt back from radiology 

## 2012-06-07 NOTE — ED Provider Notes (Signed)
History     CSN: [id number]  Arrival date & time 06/07/12  1843   First MD Initiated Contact with Patient 06/07/12 1905      Chief Complaint  Patient presents with  . Fall  . Weakness    (Consider location/radiation/quality/duration/timing/severity/associated sxs/prior treatment) The history is provided by the patient and the spouse. The history is limited by the condition of the patient.  Rodney Potts is a 67 y.o. male history of stroke, TIA, brain tumor now in remission here presenting with altered mental status and possible stroke. As per family he's been weaker for the last several days. He fell 4 days ago and sustained several rib fractures and since then has been on hydrocodone. He woke up this morning feeling diffuse weakness. As the day went on family noted that he's been more forgetful and he falls more to the right side. Patient said he felt fine and had no complaints.   Level V caveat- AMS   Past Medical History  Diagnosis Date  . Brain tumor     glioma  . Cancer     PROSTATE CANCER  . TIA (transient ischemic attack)     APPROX N2 MONTHS AGO 01/2011  . Sleep apnea   . IBS (irritable bowel syndrome)   . Shingles   . Cataract   . Seizures   . CVA (cerebral infarction)     R thalamic  . Stroke     Past Surgical History  Procedure Laterality Date  . Eye surgery      CATARACT REMOVAL  . Joint replacement      BILATERAL KNEE REPLACEMENT  . Prostatectomy    . Palate surgery    . Shoulder arthroscopy      No family history on file.  History  Substance Use Topics  . Smoking status: Former Games developer  . Smokeless tobacco: Not on file  . Alcohol Use: 3.0 oz/week    5 Glasses of wine per week      Review of Systems  Neurological: Positive for weakness.  All other systems reviewed and are negative.    Allergies  Amoxicillin; Dilaudid; Lactose intolerance (gi); and Sulfa antibiotics  Home Medications   Current Outpatient Rx  Name  Route  Sig   Dispense  Refill  . atorvastatin (LIPITOR) 40 MG tablet   Oral   Take 40 mg by mouth daily.         . calcium-vitamin D (OSCAL WITH D) 500-200 MG-UNIT per tablet   Oral   Take 1 tablet by mouth daily.         . clopidogrel (PLAVIX) 75 MG tablet   Oral   Take 1 tablet (75 mg total) by mouth now.   30 tablet   0   . HYDROcodone-acetaminophen (NORCO/VICODIN) 5-325 MG per tablet   Oral   Take 1 tablet by mouth every 6 (six) hours as needed for pain.         Marland Kitchen levETIRAcetam (KEPPRA) 1000 MG tablet   Oral   Take 500-1,000 mg by mouth 2 (two) times daily. 500 mg in the am and 1000 mg at bedtime.         Marland Kitchen PHENobarbital (LUMINAL) 97.2 MG tablet   Oral   Take 145.8 mg by mouth at bedtime.         . solifenacin (VESICARE) 10 MG tablet   Oral   Take 10 mg by mouth daily.  BP 143/81  Pulse 62  Temp(Src) 98.2 F (36.8 C) (Oral)  Resp 19  SpO2 97%  Physical Exam  Nursing note and vitals reviewed. Constitutional:  Chronically ill, NAD   HENT:  Head: Normocephalic.  Mouth/Throat: Oropharynx is clear and moist.  Eyes: Conjunctivae are normal. Pupils are equal, round, and reactive to light.  Neck: Normal range of motion. Neck supple.  Cardiovascular: Normal rate, regular rhythm and normal heart sounds.   Pulmonary/Chest: Effort normal and breath sounds normal. No respiratory distress. He has no wheezes. He has no rales.  Abdominal: Soft. Bowel sounds are normal. He exhibits no distension. There is no tenderness. There is no rebound and no guarding.  Musculoskeletal: Normal range of motion. He exhibits no edema and no tenderness.  Neurological: He is alert.  Slightly disoriented. CN 2-12 intact. Neg pronator drift. Neg rhomberg. He has shuffling gait and falls to the R side when ambulating.   Skin: Skin is warm and dry.  Psychiatric: He has a normal mood and affect. His behavior is normal. Judgment and thought content normal.    ED Course  Procedures  (including critical care time)  Labs Reviewed  COMPREHENSIVE METABOLIC PANEL - Abnormal; Notable for the following:    BUN 32 (*)    Total Bilirubin 0.2 (*)    GFR calc non Af Amer 63 (*)    GFR calc Af Amer 72 (*)    All other components within normal limits  URINE CULTURE  CBC WITH DIFFERENTIAL  TROPONIN I  PHENOBARBITAL LEVEL  URINALYSIS, ROUTINE W REFLEX MICROSCOPIC   Dg Chest 2 View  06/07/2012  *RADIOLOGY REPORT*  Clinical Data: Fall, weakness.  Landed on ribs.  Right, left, lower rib pain  CHEST - 2 VIEW  Comparison: 06/09/2011  Findings: Heart size is normal.  Aorta is tortuous.  There is mild perihilar peribronchial thickening.  No focal consolidations or pleural effusions are identified.  No evidence for pneumothorax or acute rib fracture.  Degenerative changes are seen in the shoulders, right greater than left.  IMPRESSION:  1.  Bronchitic change. 2. No focal pulmonary abnormality.   Original Report Authenticated By: Norva Pavlov, M.D.    Ct Head Wo Contrast  06/07/2012  *RADIOLOGY REPORT*  Clinical Data: Larey Seat last week.  Generalized weakness and unsteady gait with occasional confusion.  CT HEAD WITHOUT CONTRAST  Technique:  Contiguous axial images were obtained from the base of the skull through the vertex without contrast.  Comparison: 09/21/2011.  Findings: No skull fracture or intracranial hemorrhage.  Small calvarial lucencies unchanged.  Calcification right frontal lobe and prominent small vessel disease type changes stable.  Global atrophy with ventricular prominence unchanged.  No CT evidence of large acute infarct.  No intracranial mass lesion otherwise noted.  Vascular calcifications.  IMPRESSION: No skull fracture, intracranial hemorrhage or other findings of acute abnormality as noted above.   Original Report Authenticated By: Lacy Duverney, M.D.      No diagnosis found.   Date: 06/07/2012  Rate: 60  Rhythm: normal sinus rhythm  QRS Axis: normal  Intervals: PR  prolonged  ST/T Wave abnormalities: nonspecific ST changes  Conduction Disutrbances:none  Narrative Interpretation:   Old EKG Reviewed: unchanged    MDM  Rodney Potts is a 67 y.o. male here with diffuse weakness, unsteadiness. Will need to r/o stroke vs TIA. Will do CT head and labs and reassess. Will likely need neuro consult.    9:46 PM Neuro evaluated patient, recommend TIA workup. CT  head nl. Will observe overnight. Dr. Felipa Furnace accepted patient on observation on med/surg.         Richardean Canal, MD 06/07/12 2149

## 2012-06-07 NOTE — ED Notes (Signed)
Patient transported to X-ray 

## 2012-06-08 ENCOUNTER — Encounter (HOSPITAL_COMMUNITY): Payer: Self-pay | Admitting: Internal Medicine

## 2012-06-08 ENCOUNTER — Observation Stay (HOSPITAL_COMMUNITY): Payer: Medicare Other

## 2012-06-08 DIAGNOSIS — G459 Transient cerebral ischemic attack, unspecified: Secondary | ICD-10-CM | POA: Insufficient documentation

## 2012-06-08 DIAGNOSIS — R569 Unspecified convulsions: Secondary | ICD-10-CM

## 2012-06-08 DIAGNOSIS — R279 Unspecified lack of coordination: Secondary | ICD-10-CM

## 2012-06-08 DIAGNOSIS — R270 Ataxia, unspecified: Secondary | ICD-10-CM | POA: Diagnosis present

## 2012-06-08 DIAGNOSIS — I6350 Cerebral infarction due to unspecified occlusion or stenosis of unspecified cerebral artery: Secondary | ICD-10-CM

## 2012-06-08 DIAGNOSIS — R5383 Other fatigue: Secondary | ICD-10-CM

## 2012-06-08 LAB — LIPID PANEL
Cholesterol: 149 mg/dL (ref 0–200)
HDL: 57 mg/dL (ref >39)
Total CHOL/HDL Ratio: 2.6 RATIO
Triglycerides: 64 mg/dL (ref <150)

## 2012-06-08 LAB — GLUCOSE, CAPILLARY: Glucose-Capillary: 87 mg/dL (ref 70–99)

## 2012-06-08 LAB — HEMOGLOBIN A1C: Mean Plasma Glucose: 111 mg/dL (ref <117)

## 2012-06-08 MED ORDER — SODIUM CHLORIDE 0.9 % IV SOLN
INTRAVENOUS | Status: AC
  Administered 2012-06-08 (×2): via INTRAVENOUS

## 2012-06-08 MED ORDER — CLOPIDOGREL BISULFATE 75 MG PO TABS
75.0000 mg | ORAL_TABLET | Freq: Every day | ORAL | Status: DC
  Administered 2012-06-09: 75 mg via ORAL
  Filled 2012-06-08: qty 1

## 2012-06-08 MED ORDER — LEVETIRACETAM 500 MG PO TABS
1000.0000 mg | ORAL_TABLET | Freq: Every day | ORAL | Status: DC
  Administered 2012-06-08 (×2): 1000 mg via ORAL
  Filled 2012-06-08 (×4): qty 2

## 2012-06-08 MED ORDER — ATORVASTATIN CALCIUM 40 MG PO TABS
40.0000 mg | ORAL_TABLET | Freq: Every day | ORAL | Status: DC
  Administered 2012-06-08: 40 mg via ORAL
  Filled 2012-06-08 (×2): qty 1

## 2012-06-08 MED ORDER — ACETAMINOPHEN 325 MG PO TABS: 650.0000 mg | ORAL_TABLET | ORAL | Status: DC | PRN

## 2012-06-08 MED ORDER — DARIFENACIN HYDROBROMIDE ER 15 MG PO TB24
15.0000 mg | ORAL_TABLET | Freq: Every day | ORAL | Status: DC
  Administered 2012-06-08 – 2012-06-09 (×2): 15 mg via ORAL
  Filled 2012-06-08 (×2): qty 1

## 2012-06-08 MED ORDER — LEVETIRACETAM 500 MG PO TABS: 500.0000 mg | ORAL_TABLET | Freq: Two times a day (BID) | ORAL | Status: DC

## 2012-06-08 MED ORDER — HEPARIN SODIUM (PORCINE) 5000 UNIT/ML IJ SOLN
5000.0000 [IU] | Freq: Three times a day (TID) | INTRAMUSCULAR | Status: DC
  Administered 2012-06-08 – 2012-06-09 (×2): 5000 [IU] via SUBCUTANEOUS
  Filled 2012-06-08 (×6): qty 1

## 2012-06-08 MED ORDER — LEVETIRACETAM 500 MG PO TABS
500.0000 mg | ORAL_TABLET | Freq: Every day | ORAL | Status: DC
  Administered 2012-06-08 – 2012-06-09 (×2): 500 mg via ORAL
  Filled 2012-06-08 (×2): qty 1

## 2012-06-08 MED ORDER — ASPIRIN 325 MG PO TABS
325.0000 mg | ORAL_TABLET | Freq: Every day | ORAL | Status: DC
  Administered 2012-06-08: 325 mg via ORAL
  Filled 2012-06-08: qty 1

## 2012-06-08 MED ORDER — PHENOBARBITAL 32.4 MG PO TABS: 145.8000 mg | ORAL_TABLET | Freq: Every day | ORAL | Status: DC

## 2012-06-08 MED ORDER — CLOPIDOGREL BISULFATE 75 MG PO TABS
75.0000 mg | ORAL_TABLET | ORAL | Status: AC
  Administered 2012-06-08: 75 mg via ORAL
  Filled 2012-06-08: qty 1

## 2012-06-08 MED ORDER — POLYETHYLENE GLYCOL 3350 17 G PO PACK
17.0000 g | PACK | Freq: Two times a day (BID) | ORAL | Status: DC
  Administered 2012-06-08 – 2012-06-09 (×2): 17 g via ORAL
  Filled 2012-06-08 (×4): qty 1

## 2012-06-08 NOTE — Evaluation (Signed)
Physical Therapy Evaluation Patient Details Name: Rodney Potts MRN: [id number] DOB: [birth year] Today's Date: 06/08/2012 Time: 7035-0093 PT Time Calculation (min): 35 min  PT Assessment / Plan / Recommendation Clinical Impression  Pt is a 67 yo male admitted for confusion and progressive weakness/L lateral lean. Pt with h/o of GBM causing balance impairements PTA . Pt's balance further impaired and now not functioning at mod I level as he was PTA. Pt with higher level cognitive deficits ie problem solvign and executive funcitong as well. Pt safe to d/c home with 24/7 supervision/assist which he states his wife can provide. recommend home PT safety eval as well.    PT Assessment  Patient needs continued PT services    Follow Up Recommendations  Home health PT;Supervision/Assistance - 24 hour, may need ST-SNF if 24/7 can't be provided or patient doesn't progress during hospital stay    Does the patient have the potential to tolerate intense rehabilitation      Barriers to Discharge Decreased caregiver support wife works 5x/wk    Equipment Recommendations  None recommended by PT    Recommendations for Other Services     Frequency Min 3X/week    Precautions / Restrictions Precautions Precautions: Fall Restrictions Weight Bearing Restrictions: No   Pertinent Vitals/Pain 0/10      Mobility  Bed Mobility Bed Mobility: Supine to Sit;Sitting - Scoot to [employer] of Bed Transfers Transfers: Sit to Stand;Stand to Sit Sit to Stand: 4: Min guard;With upper extremity assist;From bed Stand to Sit: 4: Min assist;With upper extremity assist;To chair/3-in-1;With armrests Details for Transfer Assistance: max directional v/c's for safety and hand placement. pt repeatedly attempted to pull up from walker despite being unsuccessful and multiple verbal cues from PT. Pt with unsafe turing technique to sit in chair requiring max verbal and tactile cueing Ambulation/Gait Ambulation/Gait Assistance:  4: Min assist Ambulation Distance (Feet): 120 Feet Assistive device: Rolling walker;1 person hand held assist Ambulation/Gait Assistance Details: max directional v/c's for use of RW. Pt unsafe with use of RW due to tripping over it due to inability to keep walker close to self as instructed. pt ambulated with R HHA to mimic cane. Pt with improved gait pattern until onset of fatigue. With fatigue pt begins to have bilat knee instabilty and short, shuffled steps. Pt reports "even since I had both my knees replaced, they give out on me sometimes."  Gait Pattern: Step-to pattern;Right flexed knee in stance;Left flexed knee in stance;Shuffle;Narrow base of support;Trunk flexed Gait velocity: slow General Gait Details: no episodes of LOB however requires either AD or 1 person assist for safe ambulation to steady self and prevent falls Stairs: No Modified Rankin (Stroke Patients Only) Pre-Morbid Rankin Score: Moderate disability Modified Rankin: Moderately severe disability    Exercises     PT Diagnosis: Difficulty walking;Generalized weakness  PT Problem List: Decreased activity tolerance;Decreased balance;Decreased mobility;Decreased coordination;Decreased cognition PT Treatment Interventions: DME instruction;Gait training;Stair training;Functional mobility training;Therapeutic activities;Therapeutic exercise;Neuromuscular re-education;Balance training   PT Goals Acute Rehab PT Goals PT Goal Formulation: With patient Time For Goal Achievement: 06/22/12 Potential to Achieve Goals: Good Pt will go Supine/Side to Sit: with modified independence;with HOB 0 degrees PT Goal: Supine/Side to Sit - Progress: Goal set today Pt will go Sit to Stand: with modified independence;with upper extremity assist PT Goal: Sit to Stand - Progress: Goal set today Pt will Ambulate: >150 feet;with modified independence;with cane PT Goal: Ambulate - Progress: Goal set today Pt will Go Up / Down Stairs: Flight;with  supervision;with  rail(s) PT Goal: Up/Down Stairs - Progress: Goal set today  Visit Information  Last PT Received On: 06/08/12 Assistance Needed: +1    Subjective Data  Subjective: Pt received sitting EOB with OT with request to use BR.   Prior Functioning  Home Living Lives With: Spouse Available Help at Discharge: Family;Available PRN/intermittently Type of Home: House Home Access: Stairs to enter [employer] of Steps: 7 Entrance Stairs-Rails: Left Home Layout: Two level;Able to live on main level with bedroom/bathroom Alternate Level Stairs-Number of Steps: 15 Bathroom Shower/Tub: Tub/shower unit;Walk-in shower (tub/shower on main level) Bathroom Toilet: Standard Home Adaptive Equipment: Straight cane Additional Comments: Wife works during day. Prior Function Level of Independence: Independent with assistive device(s) (cane for walking) Able to Take Stairs?: Yes Driving: Yes Vocation: Retired Corporate investment banker: No difficulties Dominant Hand: Right    Cognition  Cognition Overall Cognitive Status: Impaired Area of Impairment: Safety/judgement;Awareness of deficits;Awareness of errors;Memory;Executive functioning Arousal/Alertness: Awake/alert Orientation Level: Disoriented to;Situation Behavior During Session: [facility] for tasks performed Memory Deficits: repeated medical history x 3 during PT session, poor carry over of safe use of RW Safety/Judgement: Decreased awareness of safety precautions;Decreased safety judgement for tasks assessed;Decreased awareness of need for assistance Awareness of Errors: Assistance required to identify errors made Awareness of Errors - Other Comments: v/c's for safe walker management Awareness of Deficits: decreased insight Executive Functioning: decreased self awareness Cognition - Other Comments: delayed processing    Extremity/Trunk Assessment Right Upper Extremity Assessment RUE ROM/Strength/Tone: [facility] for tasks  assessed Left Upper Extremity Assessment LUE ROM/Strength/Tone: WFL for tasks assessed Right Lower Extremity Assessment RLE ROM/Strength/Tone: WFL for tasks assessed RLE Sensation: WFL - Light Touch RLE Coordination: Deficits RLE Coordination Deficits: impaired sequencing during amb Left Lower Extremity Assessment LLE ROM/Strength/Tone: WFL for tasks assessed LLE Sensation: WFL - Light Touch LLE Coordination:  (impaired sequencing during amb) Trunk Assessment Trunk Assessment: Normal   Balance Balance Balance Assessed: Yes Dynamic Standing Balance Dynamic Standing - Balance Support: No upper extremity supported Dynamic Standing - Level of Assistance: 5: Stand by assistance Dynamic Standing - Balance Activities:  (washing hands at since) Dynamic Standing - Comments: v/c's to get close to sink instead of hunch over  End of Session PT - End of Session Equipment Utilized During Treatment: Gait belt Activity Tolerance: Patient limited by fatigue Patient left: in chair;with call bell/phone within reach Nurse Communication: Mobility status  GP Functional Assessment Tool Used: clinical judgement Functional Limitation: Mobility: Walking and moving around Mobility: Walking and Moving Around Current Status (F0932): At least 60 percent but less than 80 percent impaired, limited or restricted Mobility: Walking and Moving Around Goal Status [phone]): At least 20 percent but less than 40 percent impaired, limited or restricted   Kingsley Callander 06/08/2012, 11:04 AM   Kittie Plater, PT, DPT Pager #: [phone] Office #: [phone]

## 2012-06-08 NOTE — Plan of Care (Signed)
Problem: Phase I Progression Outcomes Goal: Strict NPO til swallow screen done Outcome: Completed/Met Date Met:  06/08/12 See note

## 2012-06-08 NOTE — Progress Notes (Signed)
Echocardiogram 2D Echocardiogram has been performed.  Rodney Potts 06/08/2012, 11:54 AM 

## 2012-06-08 NOTE — H&P (Addendum)
PCP:  Woody Seller, MD [facility]   Chief Complaint:   Falling to left side and confused, possible slurred speech HPI: Rodney Potts is a 67 y.o. male   has a past medical history of Brain tumor; Cancer; TIA (transient ischemic attack); Sleep apnea; IBS (irritable bowel syndrome); Shingles; Cataract; Seizures; CVA (cerebral infarction); and Stroke.   Presented with  For the past few days he has been leaning to the left more than usual and has been very confused. He had a fall earlier in the week and have cracked his ribs. Patient was started on hydrocodone by PCP to help with pain and cough. Wife states that confusion has worsened since he was started on hydrocodone but have preceded it. For the past few weeks he has been a bit more confused and have had some memory problems. He used to drive but for the past few days he has trouble understanding how to do basic tasks. Wife states he have had some mild right sided weakness since CVA last summer.  No hx of dysphagia. Wife denies slurred speech but patient felt he had some. 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 He has also have had repeated TIA's and CVA's in the past ans is currently on Plavix. He was seen by Neurology while in ED and will be admitted by hospitalist for TIA vs CVA evaluation. So far CT of the head did not show any acute findings. Phenobarbital level was within therapeutic range.   Review of Systems:    Pertinent positives include: confusion,  gait abnormality,  Constitutional:  No weight loss, night sweats, Fevers, chills, fatigue, weight loss  HEENT:  No headaches, Difficulty swallowing,Tooth/dental problems,Sore throat,  No sneezing, itching, ear ache, nasal congestion, post nasal drip,  Cardio-vascular:  No chest pain, Orthopnea, PND, anasarca, dizziness, palpitations.no Bilateral lower extremity swelling   GI:  No heartburn, indigestion, abdominal pain, nausea, vomiting, diarrhea, change in bowel habits, loss of appetite, melena, blood in stool, hematemesis Resp:  no shortness of breath at rest. No dyspnea on exertion, No excess mucus, no productive cough, No non-productive cough, No coughing up of blood.No change in color of mucus.No wheezing. Skin:  no rash or lesions. No jaundice GU:  no dysuria, change in color of urine, no urgency or frequency. No straining to urinate.  No flank pain.  Musculoskeletal:  No joint pain or no joint swelling. No decreased range of motion. No back pain.  Psych:  No change in mood or affect. No depression or anxiety. No memory loss.  Neuro: no localizing neurological complaints, no tingling, no weakness, no double vision, no no slurred speech, no   Otherwise ROS are negative except for above, 10 systems were reviewed  Past Medical History: Past Medical History  Diagnosis Date  . Brain tumor     glioma  . Cancer     PROSTATE CANCER  . TIA (transient ischemic attack)     APPROX N2 MONTHS AGO 01/2011  . Sleep apnea   . IBS (irritable bowel syndrome)   . Shingles   . Cataract   . Seizures   . CVA (cerebral infarction)     R thalamic  . Stroke    Past Surgical History  Procedure Laterality Date  . Eye surgery      CATARACT REMOVAL  . Joint replacement      BILATERAL KNEE REPLACEMENT  .  Prostatectomy    . Palate surgery    . Shoulder arthroscopy       Medications: Prior to Admission medications   Medication Sig Start Date End Date Taking? Authorizing Provider  atorvastatin (LIPITOR) 40 MG tablet Take 40 mg by mouth daily.   Yes Historical Provider, MD  calcium-vitamin D (OSCAL WITH D) 500-200 MG-UNIT per tablet Take 1 tablet by mouth daily.   Yes Historical Provider, MD  clopidogrel (PLAVIX) 75 MG tablet Take 1 tablet (75 mg total) by mouth now. 09/21/11 09/20/12 Yes Carmin Muskrat, MD  HYDROcodone-acetaminophen (NORCO/VICODIN) 5-325 MG  per tablet Take 1 tablet by mouth every 6 (six) hours as needed for pain.   Yes Historical Provider, MD  levETIRAcetam (KEPPRA) 1000 MG tablet Take 500-1,000 mg by mouth 2 (two) times daily. 500 mg in the am and 1000 mg at bedtime.   Yes Historical Provider, MD  PHENobarbital (LUMINAL) 97.2 MG tablet Take 145.8 mg by mouth at bedtime. 06/12/11  Yes Geradine Girt, DO  solifenacin (VESICARE) 10 MG tablet Take 10 mg by mouth daily.   Yes Historical Provider, MD    Allergies:   Allergies  Allergen Reactions  . Amoxicillin Diarrhea  . Dilaudid (Hydromorphone Hcl) Other (See Comments)    DELUSIONS  . Lactose Intolerance (Gi) Diarrhea  . Sulfa Antibiotics Other (See Comments)    Cant control bladder    Social History:  Ambulatory independently  Lives at  Home with wife   reports that he has quit smoking. He does not have any smokeless tobacco history on file. He reports that he drinks about 3.0 ounces of alcohol per week.   Family History: family history includes Hypertension in his father and Transient ischemic attack in his mother.    Physical Exam: Patient Vitals for the past 24 hrs:  BP Temp Temp src Pulse Resp SpO2  06/07/12 2319 142/85 mmHg 97.4 F (36.3 C) Oral 72 20 97 %  06/07/12 2215 140/83 mmHg - - 63 - 96 %  06/07/12 2145 155/94 mmHg - - 59 18 98 %  06/07/12 2115 143/81 mmHg - - 62 19 97 %  06/07/12 2100 138/81 mmHg - - 60 15 97 %  06/07/12 2045 142/81 mmHg - - 60 17 98 %  06/07/12 2030 135/84 mmHg - - 58 15 98 %  06/07/12 2024 145/81 mmHg - - - 16 -  06/07/12 1945 142/87 mmHg - - 57 17 97 %  06/07/12 1933 143/84 mmHg - - 57 13 98 %  06/07/12 1915 141/87 mmHg - - 61 18 97 %  06/07/12 1902 127/84 mmHg 98.2 F (36.8 C) Oral 59 14 94 %    1. General:  in No Acute distress 2. Psychological: Alert mildly disoriented   3. Head/ENT:   Moist  Mucous Membranes                          Head Non traumatic, neck supple                          Normal  Dentition 4. SKIN:    decreased Skin turgor,  Skin clean Dry and intact no rash 5. Heart: Regular rate and rhythm no Murmur, Rub or gallop 6. Lungs: no wheezes or crackles , somewhat coarse breath sounds 7. Abdomen: Soft, non-tender, Non distended 8. Lower extremities: no clubbing, cyanosis, or edema 9. Neurologically strength slightly weaker in left leg  states this is due to hx of surgery. Upper ext strength intact, no pronator drift.CN 2-12 intact, finger to nose intact. Positive Romberg sign. Shuffling gait falling to the left.  10. MSK: Normal range of motion  body mass index is unknown because there is no weight on file.   Labs on Admission:   Recent Labs  06/07/12 1920  NA 140  K 4.4  CL 102  CO2 28  GLUCOSE 81  BUN 32*  CREATININE 1.18  CALCIUM 9.7    Recent Labs  06/07/12 1920  AST 24  ALT 16  ALKPHOS 56  BILITOT 0.2*  PROT 7.2  ALBUMIN 4.3   No results found for this basename: LIPASE, AMYLASE,  in the last 72 hours  Recent Labs  06/07/12 1920  WBC 7.2  NEUTROABS 5.0  HGB 14.3  HCT 42.1  MCV 93.6  PLT 202    Recent Labs  06/07/12 1920  TROPONINI <0.30   No results found for this basename: TSH, T4TOTAL, FREET3, T3FREE, THYROIDAB,  in the last 72 hours No results found for this basename: VITAMINB12, FOLATE, FERRITIN, TIBC, IRON, RETICCTPCT,  in the last 72 hours Lab Results  Component Value Date   HGBA1C 5.2 08/11/2011    The CrCl is unknown because both a height and weight (above a minimum accepted value) are required for this calculation. ABG    Component Value Date/Time   PHART 7.414 08/11/2011 1423   HCO3 26.9* 08/11/2011 1423   TCO2 29 08/11/2011 1632   O2SAT 95.0 08/11/2011 1423     No results found for this basename: DDIMER     Other results:  I have pearsonaly reviewed this: ECG REPORT  Rate: 60 Rhythm: NSR ST&T Change: no ischemic changes  UA no evidence of infection Phenobarbital 23.5  Cultures:    Component Value Date/Time   SDES URINE, CLEAN  CATCH 06/09/2011 2146   SPECREQUEST NONE 06/09/2011 2146   CULT Multiple bacterial morphotypes present, none predominant. Suggest appropriate recollection if clinically indicated. 06/09/2011 2146   REPTSTATUS 06/11/2011 FINAL 06/09/2011 2146       Radiological Exams on Admission: Dg Chest 2 View  06/07/2012  *RADIOLOGY REPORT*  Clinical Data: Fall, weakness.  Landed on ribs.  Right, left, lower rib pain  CHEST - 2 VIEW  Comparison: 06/09/2011  Findings: Heart size is normal.  Aorta is tortuous.  There is mild perihilar peribronchial thickening.  No focal consolidations or pleural effusions are identified.  No evidence for pneumothorax or acute rib fracture.  Degenerative changes are seen in the shoulders, right greater than left.  IMPRESSION:  1.  Bronchitic change. 2. No focal pulmonary abnormality.   Original Report Authenticated By: Nolon Nations, M.D.    Ct Head Wo Contrast  06/07/2012  *RADIOLOGY REPORT*  Clinical Data: [place] last week.  Generalized weakness and unsteady gait with occasional confusion.  CT HEAD WITHOUT CONTRAST  Technique:  Contiguous axial images were obtained from the base of the skull through the vertex without contrast.  Comparison: 09/21/2011.  Findings: No skull fracture or intracranial hemorrhage.  Small calvarial lucencies unchanged.  Calcification right frontal lobe and prominent small vessel disease type changes stable.  Global atrophy with ventricular prominence unchanged.  No CT evidence of large acute infarct.  No intracranial mass lesion otherwise noted.  Vascular calcifications.  IMPRESSION: No skull fracture, intracranial hemorrhage or other findings of acute abnormality as noted above.   Original Report Authenticated By: Genia Del, M.D.     Chart  has been reviewed  Assessment/Plan 67 yo M with possible new ataxia and confusion with hx of TIA's and CVA in the past   Present on Admission:  TIA - this is atypical presentation but it is also difficult ot obtain hx  from the patient given confusion,  will admit based on TIA/CVA protocol, await results of MRA/MRI, Carotid Doppler and Echo, obtain cardiac enzymes,  ECG,  TSH. Order PT/OT evaluation. Will make sure patient is on antiplatelet agent. . Seizures - contineu phenobarbital and Keppra Confusion - will hold potentially offensive medications, so far no localizing infectious process. Possible early dementia component given chronicity. Will obtain TSH and B12 level.    Prophylaxis: SCD   CODE STATUS: FULL Code  Other plan as per orders.  I have spent a total of 55 min on this admission  Omaya Nieland 06/08/2012, 12:31 AM

## 2012-06-08 NOTE — Progress Notes (Signed)
[facility] PROGRESS NOTE  Kaiea Esselman Fahr RJJ:[id number] DOB: Apr 29, 1945 DOA: 06/07/2012 PCP: Woody Seller, MD  Brief Narrative: For the past few days he has been leaning to the left more than usual and has been very confused. He had a fall earlier in the week and have cracked his ribs. Patient was started on hydrocodone by PCP to help with pain and cough and he started this medication ~4 days ago. Wife states that confusion has worsened since he was started on hydrocodone but have preceded it. For the past few weeks he has been a bit more confused and have had some memory problems. He used to drive but for the past few days he has trouble understanding how to do basic tasks. Wife states he have had some mild right sided weakness since CVA last summer. No hx of dysphagia. Wife denies slurred speech but patient felt he had some.  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 He has also have had repeated TIA's and CVA's in the past ans is currently on Plavix. He was seen by Neurology while in ED and will be admitted by hospitalist for TIA vs CVA evaluation. So far CT of the head did not show any acute findings. Phenobarbital level was within therapeutic range.   Assessment/Plan:  Confusion - will hold potentially offensive medications, so far no localizing infectious process. Possible early dementia component given chronicity.  - TSH and B12 levels in process this morning - patient is AxOx4 - worsening 3-4 days ago when he started his hydrocodone.    ?TIA - this is atypical presentation  - MRA/MRI pending, Carotid Doppler and Echo,  - PT/OT  - on aspirin   Seizures - contineu phenobarbital and Keppra   DVT prophylaxis - heparin   Code Status: Full Family Communication: none  Disposition Plan: pending  MRI/TTE/Carotid  Consultants:  Neurology  Procedures:  none  Antibiotics:  none  HPI/Subjective: - feels well this morning  Objective: Filed Vitals:   06/07/12 2319 06/08/12 0222 06/08/12 0631 06/08/12 0949  BP: 142/85 119/75 120/73 128/86  Pulse: 72 55 57 65  Temp: 97.4 F (36.3 C) 97.9 F (36.6 C) 97.1 F (36.2 C) 97.8 F (36.6 C)  TempSrc: Oral Oral Oral Oral  Resp: 20 18 18 18   Height:  5\' 9"  (1.753 m)    Weight:  77.565 kg (171 lb)    SpO2: 97% 96% 95% 93%    Intake/Output Summary (Last 24 hours) at 06/08/12 1011 Last data filed at 06/08/12 0700  Gross per 24 hour  Intake    200 ml  Output    300 ml  Net   -100 ml   Filed Weights   06/08/12 0222  Weight: 77.565 kg (171 lb)    Exam:   General:  NAD  Cardiovascular: regular rate and rhythm, without MRG  Respiratory: good air movement, clear to auscultation throughout, no wheezing, ronchi or rales  Abdomen: soft, not tender to palpation, positive bowel sounds  MSK: no peripheral edema  Neuro: CN 2-12 grossly intact, MS 5-/5 left upper extremity, 5/5 on right and bilateral LE   Data Reviewed: Basic Metabolic Panel:  Recent Labs Lab 06/07/12 1920  NA 140  K 4.4  CL 102  CO2 28  GLUCOSE 81  BUN 32*  CREATININE 1.18  CALCIUM 9.7   Liver Function Tests:  Recent Labs Lab 06/07/12  1920  AST 24  ALT 16  ALKPHOS 56  BILITOT 0.2*  PROT 7.2  ALBUMIN 4.3   CBC:  Recent Labs Lab 06/07/12 1920  WBC 7.2  NEUTROABS 5.0  HGB 14.3  HCT 42.1  MCV 93.6  PLT 202   Cardiac Enzymes:  Recent Labs Lab 06/07/12 1920  TROPONINI <0.30   Studies: Dg Chest 2 View  06/07/2012  *RADIOLOGY REPORT*  Clinical Data: Fall, weakness.  Landed on ribs.  Right, left, lower rib pain  CHEST - 2 VIEW  Comparison: 06/09/2011  Findings: Heart size is normal.  Aorta is tortuous.  There is mild perihilar peribronchial thickening.  No focal consolidations or pleural effusions are identified.  No evidence  for pneumothorax or acute rib fracture.  Degenerative changes are seen in the shoulders, right greater than left.  IMPRESSION:  1.  Bronchitic change. 2. No focal pulmonary abnormality.   Original Report Authenticated By: Nolon Nations, M.D.    Ct Head Wo Contrast  06/07/2012  *RADIOLOGY REPORT*  Clinical Data: [place] last week.  Generalized weakness and unsteady gait with occasional confusion.  CT HEAD WITHOUT CONTRAST  Technique:  Contiguous axial images were obtained from the base of the skull through the vertex without contrast.  Comparison: 09/21/2011.  Findings: No skull fracture or intracranial hemorrhage.  Small calvarial lucencies unchanged.  Calcification right frontal lobe and prominent small vessel disease type changes stable.  Global atrophy with ventricular prominence unchanged.  No CT evidence of large acute infarct.  No intracranial mass lesion otherwise noted.  Vascular calcifications.  IMPRESSION: No skull fracture, intracranial hemorrhage or other findings of acute abnormality as noted above.   Original Report Authenticated By: Genia Del, M.D.     Scheduled Meds: . aspirin  325 mg Oral Daily  . atorvastatin  40 mg Oral q1800  . darifenacin  15 mg Oral Daily  . levETIRAcetam  1,000 mg Oral QHS  . levETIRAcetam  500 mg Oral Daily  . PHENobarbital  145.8 mg Oral QHS   Continuous Infusions: . sodium chloride 75 mL/hr at 06/08/12 4166    Active Problems:   Seizures   Slurred speech   TIA (transient ischemic attack)   Ataxia   Time spent: [place], MD [facility] Pager [phone]. If 7 PM - 7 AM, please contact night-coverage at [email or web address], password [email or web address] 06/08/2012, 10:11 AM  LOS: 1 day

## 2012-06-08 NOTE — Evaluation (Signed)
Occupational Therapy Evaluation Patient Details Name: Rodney Potts MRN: [id number] DOB: 06/25/1945 Today's Date: 06/08/2012 Time: 7353-2992 OT Time Calculation (min): 23 min  OT Assessment / Plan / Recommendation Clinical Impression  Pt admitted with left lean while ambulating and confusion.  Experienced fall at home earlier in week resulting in rib fx. 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  Will benefit from acute OT services to address below problem list.  Recommending HHOT with 24/7 sup/assist. May need to consider SNF if pt does not have necessary level of assist or if does not reach mod I level.    OT Assessment  Patient needs continued OT Services    Follow Up Recommendations  Home health OT;Supervision/Assistance - 24 hour (may need to update plan to SNF)    Barriers to Discharge      Equipment Recommendations  Tub/shower seat    Recommendations for Other Services    Frequency  Min 2X/week    Precautions / Restrictions Precautions Precautions: Fall Restrictions Weight Bearing Restrictions: No   Pertinent Vitals/Pain See vitals    ADL  Grooming: Performed;Wash/dry hands;Min guard Where Assessed - Grooming: Unsupported standing Upper Body Bathing: Simulated;Supervision/safety Where Assessed - Upper Body Bathing: Unsupported sitting Lower Body Bathing: Simulated;Supervision/safety Where Assessed - Lower Body Bathing: Supported sit to stand Upper Body Dressing: Simulated;Supervision/safety Where Assessed - Upper Body Dressing: Unsupported sitting Lower Body Dressing: Performed;Minimal assistance Where Assessed - Lower Body Dressing: Supported sit to Lobbyist: Minimal assistance;Simulated Armed forces technical officer Method: Sit to Loss adjuster, chartered:  (chair) Toileting - Water quality scientist and Hygiene: Performed;Min guard Where Assessed -  Best boy and Hygiene: Standing Equipment Used: Gait belt;Rolling walker Transfers/Ambulation Related to ADLs: min assist with RW. Assist for safe use of DME.  Pt tends to veer left while pushing RW to right. ADL Comments: Pt unsteady during standing components of ADLs.    OT Diagnosis: Generalized weakness;Cognitive deficits  OT Problem List: Decreased strength;Decreased activity tolerance;Decreased safety awareness;Decreased knowledge of use of DME or AE;Impaired balance (sitting and/or standing);Decreased cognition OT Treatment Interventions: Self-care/ADL training;DME and/or AE instruction;Therapeutic activities;Cognitive remediation/compensation;Patient/family education;Balance training   OT Goals Acute Rehab OT Goals OT Goal Formulation: With patient Time For Goal Achievement: 06/15/12 Potential to Achieve Goals: Good ADL Goals Pt Will Perform Grooming: with modified independence;Standing at sink ADL Goal: Grooming - Progress: Goal set today Pt Will Perform Lower Body Dressing: with modified independence;Sit to stand from chair;Sit to stand from bed ADL Goal: Lower Body Dressing - Progress: Goal set today Pt Will Transfer to Toilet: with modified independence;Ambulation;with DME;Regular height toilet ADL Goal: Toilet Transfer - Progress: Goal set today Pt Will Perform Toileting - Clothing Manipulation: with modified independence;Standing ADL Goal: Toileting - Clothing Manipulation - Progress: Goal set today Pt Will Perform Tub/Shower Transfer: Tub transfer;with supervision;Ambulation;with DME;Shower seat with back ADL Goal: Tub/Shower Transfer - Progress: Goal set today  Visit Information  Last OT Received On: 06/08/12 Assistance Needed: +1 PT/OT Co-Evaluation/Treatment: Yes    Subjective Data      Prior Functioning     Home Living Lives With: Spouse Available Help at Discharge: Family;Available PRN/intermittently Type of Home: House Home Access:  Stairs to enter [employer] of Steps: 7 Entrance Stairs-Rails: Left Home Layout: Two level;Able to live on main level with bedroom/bathroom Alternate Level Stairs-Number of Steps: 15 Bathroom Shower/Tub: Tub/shower unit;Walk-in shower (tub/shower on main  level) Bathroom Toilet: Standard Home Adaptive Equipment: Straight cane Additional Comments: Wife works during day. Prior Function Level of Independence: Independent with assistive device(s) (cane for walking) Able to Take Stairs?: Yes Driving: Yes Vocation: Retired Corporate investment banker: No difficulties Dominant Hand: Right         Vision/Perception Vision - History Baseline Vision: Wears glasses only for reading Patient Visual Report: No change from baseline   Cognition  Cognition Overall Cognitive Status: Impaired Area of Impairment: Safety/judgement;Awareness of deficits;Awareness of errors;Memory;Executive functioning Arousal/Alertness: Awake/alert Orientation Level: Disoriented to;Situation Behavior During Session: [facility] for tasks performed Memory Deficits: repeated medical history x 3 during PT session, poor carry over of safe use of RW Safety/Judgement: Decreased awareness of safety precautions;Decreased safety judgement for tasks assessed;Decreased awareness of need for assistance Awareness of Errors: Assistance required to identify errors made Awareness of Errors - Other Comments: v/c's for safe walker management Awareness of Deficits: decreased insight Executive Functioning: decreased self awareness Cognition - Other Comments: delayed processing    Extremity/Trunk Assessment Right Upper Extremity Assessment RUE ROM/Strength/Tone: [facility] for tasks assessed Left Upper Extremity Assessment LUE ROM/Strength/Tone: WFL for tasks assessed Right Lower Extremity Assessment RLE ROM/Strength/Tone: WFL for tasks assessed RLE Sensation: WFL - Light Touch RLE Coordination: Deficits RLE Coordination Deficits:  impaired sequencing during amb Left Lower Extremity Assessment LLE ROM/Strength/Tone: WFL for tasks assessed LLE Sensation: WFL - Light Touch LLE Coordination:  (impaired sequencing during amb) Trunk Assessment Trunk Assessment: Normal     Mobility Bed Mobility Bed Mobility: Supine to Sit;Sitting - Scoot to Edge of Bed Supine to Sit: 5: Supervision Sitting - Scoot to Edge of Bed: 5: Supervision Transfers Transfers: Sit to Stand;Stand to Sit Sit to Stand: 4: Min guard;With upper extremity assist;From bed Stand to Sit: 4: Min assist;With upper extremity assist;To chair/3-in-1;With armrests Details for Transfer Assistance: max directional v/c's for safety and hand placement. pt repeatedly attempted to pull up from walker despite being unsuccessful and multiple verbal cues from PT. Pt with unsafe turing technique to sit in chair requiring max verbal and tactile cueing     Exercise     Balance Balance Balance Assessed: Yes Dynamic Standing Balance Dynamic Standing - Balance Support: No upper extremity supported Dynamic Standing - Level of Assistance: 5: Stand by assistance Dynamic Standing - Balance Activities: Lateral lean/weight shifting;Forward lean/weight shifting;Reaching for objects Dynamic Standing - Comments: close guarding for safety during toileting tasks and washing hands at sink.   End of Session OT - End of Session Equipment Utilized During Treatment: Gait belt Activity Tolerance: Patient tolerated treatment well Patient left: in chair;with call bell/phone within reach Nurse Communication: Mobility status  GO Functional Assessment Tool Used: clinical judgement Functional Limitation: Self care Self Care Current Status (F7902): At least 1 percent but less than 20 percent impaired, limited or restricted Self Care Goal Status [phone]): 0 percent impaired, limited or restricted  06/08/2012 Darrol Jump OTR/L Pager [phone] Office [phone]  Darrol Jump 06/08/2012, 11:10 AM

## 2012-06-08 NOTE — Progress Notes (Signed)
Subjective: Patient had no complaints. Confusion has resolved. No seizure activity reported. Gait is stable with a rolling walker given to him by physical therapy. 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 MRA showed atherosclerotic changes intracranially which were most prominent involving the posterior cerebral arteries. No major vessel occlusion was seen. Patient has been taking Plavix daily. He has a history of previous right thalamic infarction.  Objective: Current vital signs: BP 143/82  Pulse 64  Temp(Src) 97.3 F (36.3 C) (Oral)  Resp 18  Ht 5\' 9"  (1.753 m)  Wt 77.565 kg (171 lb)  BMI 25.24 kg/m2  SpO2 97%  Neurologic Exam: Alert and in no acute distress. Patient is well-oriented to time as well as place. He had no receptive or expressive aphasia. Extraocular movements were full and conjugate. Visual fields were normal. Nose mild facial asymmetry suggestive of slight left lower facial weakness. Speech was normal with no dysarthria. Strength of extremities was normal throughout, as well as muscle tone. Coordination was normal bilaterally.  Lab Results: Results for orders placed during the hospital encounter of 06/07/12 (from the past 48 hour(s))  CBC WITH DIFFERENTIAL     Status: None   Collection Time    06/07/12  7:20 PM      Result Value Range   WBC 7.2  4.0 - 10.5 K/uL   RBC 4.50  4.22 - 5.81 MIL/uL   Hemoglobin 14.3  13.0 - 17.0 g/dL   HCT 16.1  09.6 - 04.5 %   MCV 93.6  78.0 - 100.0 fL   MCH 31.8  26.0 - 34.0 pg   MCHC 34.0  30.0 - 36.0 g/dL   RDW 40.9  81.1 - 91.4 %   Platelets 202  150 - 400 K/uL   Neutrophils Relative 69  43 - 77 %   Neutro Abs 5.0  1.7 - 7.7 K/uL   Lymphocytes Relative 18  12 - 46 %   Lymphs Abs 1.3  0.7 - 4.0 K/uL   Monocytes Relative 10  3 - 12 %   Monocytes Absolute 0.8  0.1 - 1.0 K/uL   Eosinophils Relative 3  0 - 5 %   Eosinophils Absolute 0.2  0.0 - 0.7 K/uL   Basophils Relative 0  0  - 1 %   Basophils Absolute 0.0  0.0 - 0.1 K/uL  COMPREHENSIVE METABOLIC PANEL     Status: Abnormal   Collection Time    06/07/12  7:20 PM      Result Value Range   Sodium 140  135 - 145 mEq/L   Potassium 4.4  3.5 - 5.1 mEq/L   Chloride 102  96 - 112 mEq/L   CO2 28  19 - 32 mEq/L   Glucose, Bld 81  70 - 99 mg/dL   BUN 32 (*) 6 - 23 mg/dL   Creatinine, Ser 7.82  0.50 - 1.35 mg/dL   Calcium 9.7  8.4 - 95.6 mg/dL   Total Protein 7.2  6.0 - 8.3 g/dL   Albumin 4.3  3.5 - 5.2 g/dL   AST 24  0 - 37 U/L   ALT 16  0 - 53 U/L   Alkaline Phosphatase 56  39 - 117 U/L   Total Bilirubin 0.2 (*) 0.3 - 1.2 mg/dL   GFR calc non Af Amer 63 (*) >90 mL/min   GFR calc Af Amer 72 (*) >90 mL/min   Comment:  The eGFR has been calculated     using the CKD EPI equation.     This calculation has not been     validated in all clinical     situations.     eGFR's persistently     <90 mL/min signify     possible Chronic Kidney Disease.  TROPONIN I     Status: None   Collection Time    06/07/12  7:20 PM      Result Value Range   Troponin I <0.30  <0.30 ng/mL   Comment:            Due to the release kinetics of cTnI,     a negative result within the first hours     of the onset of symptoms does not rule out     myocardial infarction with certainty.     If myocardial infarction is still suspected,     repeat the test at appropriate intervals.  GLUCOSE, CAPILLARY     Status: None   Collection Time    06/07/12  7:20 PM      Result Value Range   Glucose-Capillary 87  70 - 99 mg/dL  PHENOBARBITAL LEVEL     Status: None   Collection Time    06/07/12  9:14 PM      Result Value Range   Phenobarbital 23.5  15.0 - 40.0 ug/mL  URINALYSIS, ROUTINE W REFLEX MICROSCOPIC     Status: Abnormal   Collection Time    06/07/12  9:41 PM      Result Value Range   Color, Urine YELLOW  YELLOW   APPearance CLEAR  CLEAR   Specific Gravity, Urine 1.028  1.005 - 1.030   pH 6.0  5.0 - 8.0   Glucose, UA  NEGATIVE  NEGATIVE mg/dL   Hgb urine dipstick NEGATIVE  NEGATIVE   Bilirubin Urine NEGATIVE  NEGATIVE   Ketones, ur 15 (*) NEGATIVE mg/dL   Protein, ur NEGATIVE  NEGATIVE mg/dL   Urobilinogen, UA 0.2  0.0 - 1.0 mg/dL   Nitrite NEGATIVE  NEGATIVE   Leukocytes, UA NEGATIVE  NEGATIVE   Comment: MICROSCOPIC NOT DONE ON URINES WITH NEGATIVE PROTEIN, BLOOD, LEUKOCYTES, NITRITE, OR GLUCOSE <1000 mg/dL.  LIPID PANEL     Status: None   Collection Time    06/08/12  5:55 AM      Result Value Range   Cholesterol 149  0 - 200 mg/dL   Triglycerides 64  <161 mg/dL   HDL 57  >09 mg/dL   Total CHOL/HDL Ratio 2.6     VLDL 13  0 - 40 mg/dL   LDL Cholesterol 79  0 - 99 mg/dL   Comment:            Total Cholesterol/HDL:CHD Risk     Coronary Heart Disease Risk Table                         Men   Women      1/2 Average Risk   3.4   3.3      Average Risk       5.0   4.4      2 X Average Risk   9.6   7.1      3 X Average Risk  23.4   11.0                Use the calculated Patient Ratio     above  and the CHD Risk Table     to determine the patient's CHD Risk.                ATP III CLASSIFICATION (LDL):      <100     mg/dL   Optimal      161-096  mg/dL   Near or Above                        Optimal      130-159  mg/dL   Borderline      045-409  mg/dL   High      >811     mg/dL   Very High  VITAMIN B14     Status: None   Collection Time    06/08/12  5:55 AM      Result Value Range   Vitamin B-12 439  211 - 911 pg/mL  TSH     Status: None   Collection Time    06/08/12  5:55 AM      Result Value Range   TSH 3.661  0.350 - 4.500 uIU/mL  HEMOGLOBIN A1C     Status: None   Collection Time    06/08/12  5:55 AM      Result Value Range   Hemoglobin A1C 5.5  <5.7 %   Comment: (NOTE)                                                                               According to the ADA Clinical Practice Recommendations for 2011, when     HbA1c is used as a screening test:      >=6.5%   Diagnostic of  Diabetes Mellitus               (if abnormal result is confirmed)     5.7-6.4%   Increased risk of developing Diabetes Mellitus     References:Diagnosis and Classification of Diabetes Mellitus,Diabetes     Care,2011,34(Suppl 1):S62-S69 and Standards of Medical Care in             Diabetes - 2011,Diabetes Care,2011,34 (Suppl 1):S11-S61.   Mean Plasma Glucose 111  <117 mg/dL    Studies/Results: Dg Chest 2 View  06/07/2012  *RADIOLOGY REPORT*  Clinical Data: Fall, weakness.  Landed on ribs.  Right, left, lower rib pain  CHEST - 2 VIEW  Comparison: 06/09/2011  Findings: Heart size is normal.  Aorta is tortuous.  There is mild perihilar peribronchial thickening.  No focal consolidations or pleural effusions are identified.  No evidence for pneumothorax or acute rib fracture.  Degenerative changes are seen in the shoulders, right greater than left.  IMPRESSION:  1.  Bronchitic change. 2. No focal pulmonary abnormality.   Original Report Authenticated By: Norva Pavlov, M.D.    Ct Head Wo Contrast  06/07/2012  *RADIOLOGY REPORT*  Clinical Data: Larey Seat last week.  Generalized weakness and unsteady gait with occasional confusion.  CT HEAD WITHOUT CONTRAST  Technique:  Contiguous axial images were obtained from the base of the skull through the vertex without contrast.  Comparison: 09/21/2011.  Findings: No skull fracture or intracranial hemorrhage.  Small calvarial lucencies unchanged.  Calcification right frontal lobe and prominent small vessel disease type changes stable.  Global atrophy with ventricular prominence unchanged.  No CT evidence of large acute infarct.  No intracranial mass lesion otherwise noted.  Vascular calcifications.  IMPRESSION: No skull fracture, intracranial hemorrhage or other findings of acute abnormality as noted above.   Original Report Authenticated By: Lacy Duverney, M.D.    Mri Brain Without Contrast  06/08/2012  *RADIOLOGY REPORT*  Clinical Data:  Remote brain tumor (1982).   Prostate cancer. Recently leaning to the left more than usual and confused.  Fall earlier this week with rib fracture.  Started hydrocodone. Confusion worsened.  Prior infarct with mild right-sided weakness.  MRI BRAIN WITHOUT CONTRAST MRA HEAD WITHOUT CONTRAST  Technique: Multiplanar, multiecho pulse sequences of the brain and surrounding structures were obtained according to standard protocol without intravenous contrast.  Angiographic images of the head were obtained using MRA technique without contrast.  Comparison: Several prior examinations with most recent head CT 06/07/2012.  Most recent brain MRI 08/11/2011 and most remote available MR 01/22/2011.  MRI HEAD  Findings:  Tiny acute non hemorrhagic infarct posterior left lenticular nucleus.  Remote right thalamic infarct.  Right frontal parenchymal changes and calcification may represent result of treated tumor and without change.  Significant confluent white matter type changes throughout the brain with scattered areas of punctate blood breakdown products. These findings may reflect result of treatment of tumor (radiation therapy/chemo therapy) and / or result of chronic hemorrhagic small vessel disease type changes and without significant change.  Global atrophy.  Ventricular prominence unchanged and more consistent with result of atrophy rather than hydrocephalus.  Major intracranial vascular structures are patent.  Polypoid opacification inferior medial aspect right maxillary sinus.  IMPRESSION: Tiny acute non hemorrhagic infarct posterior left lenticular nucleus.  Please see above for additional findings.  MRA HEAD  Findings: Anterior circulation without medium or large size vessel significant stenosis or occlusion.  Internal carotid arteries are ectatic.  Mild irregularity and narrowing involving portions of the middle cerebral artery branches bilaterally.  Vertebral arteries and basilar artery are mildly ectatic.  No high- grade stenosis.   Nonvisualization right PICA and left AICA.  High-grade stenosis involving the proximal aspect of the posterior cerebral arteries more notable on the right.  Posterior cerebral artery branch vessel narrowing irregularity more notable on the right.  No aneurysm or vascular malformation.  IMPRESSION: Intracranial atherosclerotic type changes as noted above most notable involving the posterior cerebral arteries.  Critical Value/emergent results were called by telephone at the time of interpretation on 06/08/2012 at 12:42 p.m. to Aisha the patient's nurse, who verbally acknowledged these results.   Original Report Authenticated By: Lacy Duverney, M.D.    Mr Mra Head/brain Wo Cm  06/08/2012  *RADIOLOGY REPORT*  Clinical Data:  Remote brain tumor (1982).  Prostate cancer. Recently leaning to the left more than usual and confused.  Fall earlier this week with rib fracture.  Started hydrocodone. Confusion worsened.  Prior infarct with mild right-sided weakness.  MRI BRAIN WITHOUT CONTRAST MRA HEAD WITHOUT CONTRAST  Technique: Multiplanar, multiecho pulse sequences of the brain and surrounding structures were obtained according to standard protocol without intravenous contrast.  Angiographic images of the head were obtained using MRA technique without contrast.  Comparison: Several prior examinations with most recent head CT 06/07/2012.  Most recent brain MRI 08/11/2011 and most remote available MR 01/22/2011.  MRI HEAD  Findings:  Tiny acute non hemorrhagic infarct posterior left lenticular nucleus.  Remote  right thalamic infarct.  Right frontal parenchymal changes and calcification may represent result of treated tumor and without change.  Significant confluent white matter type changes throughout the brain with scattered areas of punctate blood breakdown products. These findings may reflect result of treatment of tumor (radiation therapy/chemo therapy) and / or result of chronic hemorrhagic small vessel disease type  changes and without significant change.  Global atrophy.  Ventricular prominence unchanged and more consistent with result of atrophy rather than hydrocephalus.  Major intracranial vascular structures are patent.  Polypoid opacification inferior medial aspect right maxillary sinus.  IMPRESSION: Tiny acute non hemorrhagic infarct posterior left lenticular nucleus.  Please see above for additional findings.  MRA HEAD  Findings: Anterior circulation without medium or large size vessel significant stenosis or occlusion.  Internal carotid arteries are ectatic.  Mild irregularity and narrowing involving portions of the middle cerebral artery branches bilaterally.  Vertebral arteries and basilar artery are mildly ectatic.  No high- grade stenosis.  Nonvisualization right PICA and left AICA.  High-grade stenosis involving the proximal aspect of the posterior cerebral arteries more notable on the right.  Posterior cerebral artery branch vessel narrowing irregularity more notable on the right.  No aneurysm or vascular malformation.  IMPRESSION: Intracranial atherosclerotic type changes as noted above most notable involving the posterior cerebral arteries.  Critical Value/emergent results were called by telephone at the time of interpretation on 06/08/2012 at 12:42 p.m. to Aisha the patient's nurse, who verbally acknowledged these results.   Original Report Authenticated By: Lacy Duverney, M.D.     Medications:  I have reviewed the patient's current medications. Scheduled: . atorvastatin  40 mg Oral q1800  . [START ON 06/09/2012] clopidogrel  75 mg Oral Q breakfast  . darifenacin  15 mg Oral Daily  . heparin subcutaneous  5,000 Units Subcutaneous Q8H  . levETIRAcetam  1,000 mg Oral QHS  . levETIRAcetam  500 mg Oral Daily  . PHENobarbital  145.8 mg Oral QHS  . 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 Hemoglobin A1c and  fasting lipid panel were normal. 2D echocardiogram was unremarkable. Carotid Doppler study is pending.  Recommendations: Carotid Doppler study as planned.  Continue with physical therapy intervention. Continue Plavix 75 mg per day, as well.  C.R. Roseanne Reno, MD [facility] [phone]  06/08/2012  5:02 PM

## 2012-06-08 NOTE — Progress Notes (Signed)
Nurse was told in report that patient failed swallow screen due to history of dysphagia. When patient got admitted to the unit, pt was kept NPO. But patient reported he ate a sandwich while he was in the ED. Patient refused h/o of dysphagia but wife said she had noticed patient holding liquids lately for a minute before he swallow but no choking. Nurse did a repeat stroke swallow screen and patient did pass the test. Dr. Adela Glimpse was notified and is aware of it. Patient is on a heart healthy diet now.

## 2012-06-08 NOTE — Progress Notes (Signed)
MRI result was called in and MD was notified of findings. No new order at this time. RN will continue to monitor patient.

## 2012-06-08 NOTE — Progress Notes (Signed)
Utilization review completed.  

## 2012-06-09 DIAGNOSIS — R569 Unspecified convulsions: Secondary | ICD-10-CM

## 2012-06-09 DIAGNOSIS — I6789 Other cerebrovascular disease: Secondary | ICD-10-CM

## 2012-06-09 DIAGNOSIS — R4182 Altered mental status, unspecified: Secondary | ICD-10-CM

## 2012-06-09 DIAGNOSIS — C719 Malignant neoplasm of brain, unspecified: Secondary | ICD-10-CM

## 2012-06-09 LAB — URINE CULTURE: Colony Count: 35000

## 2012-06-09 MED ORDER — UNABLE TO FIND: Status: DC

## 2012-06-09 NOTE — Progress Notes (Signed)
Dr Petra Kuba notified of increased confusion, incontinence, agitation, ataxia.  VSS, sinus brady per tele.

## 2012-06-09 NOTE — Progress Notes (Signed)
   CARE MANAGEMENT NOTE 06/09/2012  Patient:  Rodney Potts, Rodney Potts   Account Number:  [id number]  Date Initiated:  06/09/2012  Documentation initiated by:  [facility]  Subjective/Objective Assessment:   CVA, seizures     Action/Plan:   lives at home with wife, Coy Saunas # [phone]   Anticipated DC Date:  06/09/2012   Anticipated DC Plan:  HOME/SELF CARE      DC Planning Services  CM consult  OP Neuro Rehab      Choice offered to / List presented to:             Status of service:  Completed, signed off Medicare Important Message given?   (If response is "NO", the following Medicare IM given date fields will be blank) Date Medicare IM given:   Date Additional Medicare IM given:    Discharge Disposition:  HOME/SELF CARE  Per UR Regulation:    If discussed at Long Length of Stay Meetings, dates discussed:    Comments:  06/09/2012 1100 Spoke to pt and he is agreeable to go to outpt PT/OT. Provided pt with direction to [facility]. Faxed referral to [facility]. No DME requested. Has RW at home. Isidoro Donning RN CCM Case Mgmt phone [phone]

## 2012-06-09 NOTE — Progress Notes (Signed)
Physical Therapy Treatment Patient Details Name: Rodney Potts MRN: [id number] DOB: 30-Mar-1945 Today's Date: 06/09/2012 Time: 7829-5621 PT Time Calculation (min): 14 min  PT Assessment / Plan / Recommendation Comments on Treatment Session  67 yo with h/o brain tumor, now s/p recent fall (reports he was standing on sllick floor in his socks and went to remove his shirt over his head and fell backwards). Wife present and very familiar with how to cue pt to decr impulsiveness, during gait to incr clearance of Lt foot, and on stairs. Wife/pt agreeable to OPPT. Wife reports she has taken time off work to provide 24/7 care on d/c.    Follow Up Recommendations  Outpatient PT;Supervision/Assistance - 24 hour     Does the patient have the potential to tolerate intense rehabilitation     Barriers to Discharge        Equipment Recommendations  None recommended by PT    Recommendations for Other Services    Frequency Min 3X/week   Plan Discharge plan needs to be updated;Frequency remains appropriate    Precautions / Restrictions Precautions Precautions: Fall Precaution Comments: pt high fall risk due to decr cognition, LLE weakness Restrictions Weight Bearing Restrictions: No   Pertinent Vitals/Pain Denied pain; no apparent distress     Mobility  Bed Mobility Bed Mobility: Not assessed Transfers Transfers: Sit to Stand;Stand to Sit Sit to Stand: 4: Min guard Stand to Sit: 4: Min guard Details for Transfer Assistance: max vc for safe hand placement/safe use of RW; wife aware of proper cues, yet states pt often does not listen to her as well as he listens to [place] Ambulation/Gait Ambulation/Gait Assistance: 4: Min assist Ambulation Distance (Feet): 80 Feet Assistive device: Rolling walker Ambulation/Gait Assistance Details: min vc for safe use of RW during gait; pt keeping RW close to his body ~80% of time and only requiring one cue to correct when needed. vc for "big step and heel" on  Lt ~80% of time needed to prevent Lt foot drag. Also "stand tall over right leg" with manual facilitation to assist with hip/knee extension on Rt and to decr Rt lateral lean. Gait Pattern: Step-through pattern;Decreased step length - left;Decreased stance time - left;Decreased weight shift to left;Left foot flat;Right flexed knee in stance;Lateral trunk lean to right;Trunk flexed Gait velocity: slow Stairs: Yes Stairs Assistance: 4: Min assist Stairs Assistance Details (indicate cue type and reason): on pt's left, holding at his waist with belt for stability, holding Lt hand (ascending) Stair Management Technique: One rail Right;Forwards;Step to pattern;Other (comment) (wife assisted as descending) Number of Stairs: 10 Modified Rankin (Stroke Patients Only) Pre-Morbid Rankin Score: Moderate disability Modified Rankin: Moderately severe disability    Exercises     PT Diagnosis:    PT Problem List:   PT Treatment Interventions:     PT Goals Acute Rehab PT Goals Pt will go Sit to Stand: with modified independence;with upper extremity assist PT Goal: Sit to Stand - Progress: Progressing toward goal Pt will Ambulate: >150 feet;with modified independence;with cane PT Goal: Ambulate - Progress: Progressing toward goal Pt will Go Up / Down Stairs: Flight;with supervision;with rail(s) PT Goal: Up/Down Stairs - Progress: Progressing toward goal  Visit Information  Last PT Received On: 06/09/12 Assistance Needed: +1    Subjective Data  Subjective: Wife reports she is very familiar with how to use RW (they do have one). She knew appropriate cues to provide pt with stairs and gait. Patient Stated Goal: wife agrees goal for home  today and OPPT   Cognition  Cognition Overall Cognitive Status: Impaired Area of Impairment: Safety/judgement;Awareness of deficits;Awareness of errors;Memory;Executive functioning Arousal/Alertness: Awake/alert Behavior During Session: Other (comment)  (impulsive) Memory: Decreased recall of precautions Memory Deficits: requires constant cues for LLE advancement to prevent dragging/tripping on Lt foot Safety/Judgement: Decreased awareness of safety precautions;Decreased safety judgement for tasks assessed;Decreased awareness of need for assistance Awareness of Errors: Assistance required to identify errors made Awareness of Errors - Other Comments: v/c's for safe walker management Awareness of Deficits: decreased insight Executive Functioning: decreased self awareness Cognition - Other Comments: delayed processing    Balance  Dynamic Standing Balance Dynamic Standing - Balance Support: Bilateral upper extremity supported Dynamic Standing - Level of Assistance: 5: Stand by assistance  End of Session PT - End of Session Equipment Utilized During Treatment: Gait belt Activity Tolerance: Patient limited by fatigue Patient left: in chair;with call bell/phone within reach;with family/visitor present Nurse Communication: Mobility status;Other (comment) (OK for d/c from PT perspective; family ed complete)   GP     Sheridan Hew 06/09/2012, 11:49 AM  06/09/2012 Veda Canning, PT Pager: [phone]

## 2012-06-09 NOTE — Discharge Summary (Signed)
Physician Discharge Summary  Patient ID: ALIJAH AKRAM MRN: [id number] DOB/AGE: [birth year] 67 y.o.  Admit date: 06/07/2012 Discharge date: 06/09/2012  Primary Care Physician:  Pamelia Hoit, MD  Discharge Diagnoses:    . Slurred speech with confusion . Seizures . Ataxia . CVA (cerebral infarction)  Consults:  Neurology, Dr. Pearlean Brownie  Discharge Medications:   Medication List    STOP taking these medications       HYDROcodone-acetaminophen 5-325 MG per tablet  Commonly known as:  NORCO/VICODIN      TAKE these medications       atorvastatin 40 MG tablet  Commonly known as:  LIPITOR  Take 40 mg by mouth daily.     calcium-vitamin D 500-200 MG-UNIT per tablet  Commonly known as:  OSCAL WITH D  Take 1 tablet by mouth daily.     clopidogrel 75 MG tablet  Commonly known as:  PLAVIX  Take 1 tablet (75 mg total) by mouth now.     levETIRAcetam 1000 MG tablet  Commonly known as:  KEPPRA  Take 500-1,000 mg by mouth 2 (two) times daily. 500 mg in the am and 1000 mg at bedtime.     PHENobarbital 97.2 MG tablet  Commonly known as:  LUMINAL  Take 145.8 mg by mouth at bedtime.     solifenacin 10 MG tablet  Commonly known as:  VESICARE  Take 10 mg by mouth daily.     UNABLE TO FIND  OUTPATIENT PHYSICAL THERAPY  OUTPATIENT OCCUPATIONAL THERAPY      Diagnosis: Acute CVA, seizures, gait instability         Brief H and P: For complete details please refer to admission H and P, but in brief patient is a 67 year old male with past medical history of brain tumor, recurrent TIA/CVA's, sleep apnea presented with falling towards the left side, confusion and possible slurred speech. For the past few days he had been leaning to the left more than usual and had been very confused. He had a fall earlier in the week and had cracked his ribs. Patient was started on hydrocodone by PCP to help with pain and cough. Wife stated that confusion has worsened since he was started on  hydrocodone but have preceded it. For the past few weeks he had been a bit more confused and have had some memory problems. He used to drive but for the past few days he has trouble understanding how to do basic tasks. Wife stated he have had some mild right sided weakness since CVA last summer. Wife denied slurred speech but patient felt he had some.  Hospital Course:  Elza Sortor Dishman is a 67 y.o. male with a history of glioma treated in 1982, treated with radiation, also has a history of prostate cancer, recurrent TIAs as well as previous right thalamic stroke.  Over the past several days before admission he had progressive difficulty walking with confusion which was exacerbated somewhat by hydrocodone (given for broken rib).  Over the next few days he had progressive confusion as well as more difficulty walking. Patient was not a TPA candidate secondary to delay in arrival. He was admitted 06/08/2012 for further evaluation and treatment.    CVA (cerebral infarction): - Patient underwent full stroke workup which included MRI of the brain which showed a tiny acute nonhemorrhagic infarct posterior left lenticular necklace. MRA of the brain showed intracranial atherosclerotic type changes most notable involving the posterior cerebral arteries. 2-D echo showed EF of 55-60% with no source  of embolus. Carotid Dopplers which showed no evidence of hemodynamically significant internal carotid artery stenosis. Physical therapy evaluation recommended home health PT and OT. Patient was continued on Plavix. He has an appointment with Dr. Pearlean Brownie.   Active Problems:   Seizures: Continue off and on Carbatrol and Keppra    Ataxia: Home health PTOT  Day of Discharge BP 109/76  Pulse 51  Temp(Src) 97.6 F (36.4 C) (Oral)  Resp 20  Ht 5\' 9"  (1.753 m)  Wt 77.565 kg (171 lb)  BMI 25.24 kg/m2  SpO2 97%  Physical Exam: General: Alert and awake oriented x3 not in any acute distress. CVS: S1-S2 clear no murmur rubs  or gallops Chest: clear to auscultation bilaterally, no wheezing rales or rhonchi Abdomen: soft nontender, nondistended, normal bowel sounds Extremities: no cyanosis, clubbing or edema noted bilaterally    The results of significant diagnostics from this hospitalization (including imaging, microbiology, ancillary and laboratory) are listed below for reference.    LAB RESULTS: Basic Metabolic Panel:  Recent Labs Lab 06/07/12 1920  NA 140  K 4.4  CL 102  CO2 28  GLUCOSE 81  BUN 32*  CREATININE 1.18  CALCIUM 9.7   Liver Function Tests:  Recent Labs Lab 06/07/12 1920  AST 24  ALT 16  ALKPHOS 56  BILITOT 0.2*  PROT 7.2  ALBUMIN 4.3   No results found for this basename: LIPASE, AMYLASE,  in the last 168 hours No results found for this basename: AMMONIA,  in the last 168 hours CBC:  Recent Labs Lab 06/07/12 1920  WBC 7.2  NEUTROABS 5.0  HGB 14.3  HCT 42.1  MCV 93.6  PLT 202   Cardiac Enzymes:  Recent Labs Lab 06/07/12 1920  TROPONINI <0.30   BNP: No components found with this basename: POCBNP,  CBG:  Recent Labs Lab 06/07/12 1920  GLUCAP 87    Significant Diagnostic Studies:  Dg Chest 2 View  06/07/2012  *RADIOLOGY REPORT*  Clinical Data: Fall, weakness.  Landed on ribs.  Right, left, lower rib pain  CHEST - 2 VIEW  Comparison: 06/09/2011  Findings: Heart size is normal.  Aorta is tortuous.  There is mild perihilar peribronchial thickening.  No focal consolidations or pleural effusions are identified.  No evidence for pneumothorax or acute rib fracture.  Degenerative changes are seen in the shoulders, right greater than left.  IMPRESSION:  1.  Bronchitic change. 2. No focal pulmonary abnormality.   Original Report Authenticated By: Norva Pavlov, M.D.    Ct Head Wo Contrast  06/07/2012  *RADIOLOGY REPORT*  Clinical Data: Larey Seat last week.  Generalized weakness and unsteady gait with occasional confusion.  CT HEAD WITHOUT CONTRAST  Technique:  Contiguous  axial images were obtained from the base of the skull through the vertex without contrast.  Comparison: 09/21/2011.  Findings: No skull fracture or intracranial hemorrhage.  Small calvarial lucencies unchanged.  Calcification right frontal lobe and prominent small vessel disease type changes stable.  Global atrophy with ventricular prominence unchanged.  No CT evidence of large acute infarct.  No intracranial mass lesion otherwise noted.  Vascular calcifications.  IMPRESSION: No skull fracture, intracranial hemorrhage or other findings of acute abnormality as noted above.   Original Report Authenticated By: Lacy Duverney, M.D.    Mri Brain Without Contrast  06/08/2012  *RADIOLOGY REPORT*  Clinical Data:  Remote brain tumor (1982).  Prostate cancer. Recently leaning to the left more than usual and confused.  Fall earlier this week with rib fracture.  Started hydrocodone. Confusion worsened.  Prior infarct with mild right-sided weakness.  MRI BRAIN WITHOUT CONTRAST MRA HEAD WITHOUT CONTRAST  Technique: Multiplanar, multiecho pulse sequences of the brain and surrounding structures were obtained according to standard protocol without intravenous contrast.  Angiographic images of the head were obtained using MRA technique without contrast.  Comparison: Several prior examinations with most recent head CT 06/07/2012.  Most recent brain MRI 08/11/2011 and most remote available MR 01/22/2011.  MRI HEAD  Findings:  Tiny acute non hemorrhagic infarct posterior left lenticular nucleus.  Remote right thalamic infarct.  Right frontal parenchymal changes and calcification may represent result of treated tumor and without change.  Significant confluent white matter type changes throughout the brain with scattered areas of punctate blood breakdown products. These findings may reflect result of treatment of tumor (radiation therapy/chemo therapy) and / or result of chronic hemorrhagic small vessel disease type changes and without  significant change.  Global atrophy.  Ventricular prominence unchanged and more consistent with result of atrophy rather than hydrocephalus.  Major intracranial vascular structures are patent.  Polypoid opacification inferior medial aspect right maxillary sinus.  IMPRESSION: Tiny acute non hemorrhagic infarct posterior left lenticular nucleus.  Please see above for additional findings.  MRA HEAD  Findings: Anterior circulation without medium or large size vessel significant stenosis or occlusion.  Internal carotid arteries are ectatic.  Mild irregularity and narrowing involving portions of the middle cerebral artery branches bilaterally.  Vertebral arteries and basilar artery are mildly ectatic.  No high- grade stenosis.  Nonvisualization right PICA and left AICA.  High-grade stenosis involving the proximal aspect of the posterior cerebral arteries more notable on the right.  Posterior cerebral artery branch vessel narrowing irregularity more notable on the right.  No aneurysm or vascular malformation.  IMPRESSION: Intracranial atherosclerotic type changes as noted above most notable involving the posterior cerebral arteries.  Critical Value/emergent results were called by telephone at the time of interpretation on 06/08/2012 at 12:42 p.m. to Aisha the patient's nurse, who verbally acknowledged these results.   Original Report Authenticated By: Lacy Duverney, M.D.    Mr Mra Head/brain Wo Cm  06/08/2012  *RADIOLOGY REPORT*  Clinical Data:  Remote brain tumor (1982).  Prostate cancer. Recently leaning to the left more than usual and confused.  Fall earlier this week with rib fracture.  Started hydrocodone. Confusion worsened.  Prior infarct with mild right-sided weakness.  MRI BRAIN WITHOUT CONTRAST MRA HEAD WITHOUT CONTRAST  Technique: Multiplanar, multiecho pulse sequences of the brain and surrounding structures were obtained according to standard protocol without intravenous contrast.  Angiographic images of the  head were obtained using MRA technique without contrast.  Comparison: Several prior examinations with most recent head CT 06/07/2012.  Most recent brain MRI 08/11/2011 and most remote available MR 01/22/2011.  MRI HEAD  Findings:  Tiny acute non hemorrhagic infarct posterior left lenticular nucleus.  Remote right thalamic infarct.  Right frontal parenchymal changes and calcification may represent result of treated tumor and without change.  Significant confluent white matter type changes throughout the brain with scattered areas of punctate blood breakdown products. These findings may reflect result of treatment of tumor (radiation therapy/chemo therapy) and / or result of chronic hemorrhagic small vessel disease type changes and without significant change.  Global atrophy.  Ventricular prominence unchanged and more consistent with result of atrophy rather than hydrocephalus.  Major intracranial vascular structures are patent.  Polypoid opacification inferior medial aspect right maxillary sinus.  IMPRESSION: Tiny acute non hemorrhagic  infarct posterior left lenticular nucleus.  Please see above for additional findings.  MRA HEAD  Findings: Anterior circulation without medium or large size vessel significant stenosis or occlusion.  Internal carotid arteries are ectatic.  Mild irregularity and narrowing involving portions of the middle cerebral artery branches bilaterally.  Vertebral arteries and basilar artery are mildly ectatic.  No high- grade stenosis.  Nonvisualization right PICA and left AICA.  High-grade stenosis involving the proximal aspect of the posterior cerebral arteries more notable on the right.  Posterior cerebral artery branch vessel narrowing irregularity more notable on the right.  No aneurysm or vascular malformation.  IMPRESSION: Intracranial atherosclerotic type changes as noted above most notable involving the posterior cerebral arteries.  Critical Value/emergent results were called by telephone  at the time of interpretation on 06/08/2012 at 12:42 p.m. to Aisha the patient's nurse, who verbally acknowledged these results.   Original Report Authenticated By: Lacy Duverney, M.D.      Disposition and Follow-up: Discharge Orders   Future Appointments Provider Department Dept Phone   07/02/2012 3:00 PM [name], MD [facility] [phone]   Future Orders Complete By Expires     Diet - low sodium heart healthy  As directed     Increase activity slowly  As directed         DISPOSITION: home  DIET: Heart healthy ACTIVITY: As tolerated   DISCHARGE FOLLOW-UP Follow-up Information   Follow up with Gates Rigg, MD In 2 weeks. (keep previously arranged appt)    Contact information:   [address] [place] [address] [phone]       Follow up with Pamelia Hoit, MD. Schedule an appointment as soon as possible for a visit in 10 days. (for hospital follow-up)    Contact information:   [address] [place] [address] [phone]       Time spent on Discharge: 38 mins  Signed:   RAI,RIPUDEEP M.D. [facility] 06/09/2012, 10:46 AM Pager: [phone]

## 2012-06-09 NOTE — Progress Notes (Signed)
VASCULAR LAB PRELIMINARY  PRELIMINARY  PRELIMINARY  PRELIMINARY  Carotid Dopplers completed.    Preliminary report:  There is no significant ICA stenosis.  Vertebral artery flow is antegrade.  Rodney Potts, RVT 06/09/2012, 8:32 AM

## 2012-06-09 NOTE — Progress Notes (Signed)
Stroke Team Progress Note  HISTORY Rodney Potts is a 66 y.o. male with a history of glioma treated in 1982. At that time he did have radiation. He also has a history of prostate cancer. He has a history of multiple TIAs as well as previous right thalamic stroke. Over the past several days, he has had progressive difficulty walking and confusion. He initially fell last Wednesday, though it is not clear that time that his gait was any worse than normal. He was given hydrocodone due to a broken rib that was being exacerbated by a chronic cough. Over the next few days he had progressive confusion as well as more difficulty walking. Patient was not a TPA candidate secondary to delay in arrival. He was admitted 06/08/2012 for further evaluation and treatment.  SUBJECTIVE His wife is at the bedside.  Overall he feels his condition is gradually improving. Wife reports ongoing balance issues since initial brain tumor OR, hx falls, fell and broke ribs last week. He is scared to walk down the steps, scared he will fall again. Wife also reports shuffling gait.  OBJECTIVE Most recent Vital Signs: Filed Vitals:   06/08/12 1800 06/08/12 2219 06/09/12 0232 06/09/12 0616  BP: 122/77 134/81 126/87 109/76  Pulse: 62 59 56 51  Temp: 97.6 F (36.4 C) 97.7 F (36.5 C)  97.6 F (36.4 C)  TempSrc: Oral Oral  Oral  Resp: 17 18 18 20   Height:      Weight:      SpO2: 96% 95% 96% 97%   CBG (last 3)   Recent Labs  06/07/12 1920  GLUCAP 87    IV Fluid Intake:     MEDICATIONS  . atorvastatin  40 mg Oral q1800  . clopidogrel  75 mg Oral Q breakfast  . darifenacin  15 mg Oral Daily  . heparin subcutaneous  5,000 Units Subcutaneous Q8H  . levETIRAcetam  1,000 mg Oral QHS  . levETIRAcetam  500 mg Oral Daily  . PHENobarbital  145.8 mg Oral QHS  . polyethylene glycol  17 g Oral BID   PRN:  acetaminophen  Diet:  Cardiac thin liquids Activity:  As tolerated DVT Prophylaxis:  Heparin 5000 units sq  tid  CLINICALLY SIGNIFICANT STUDIES Basic Metabolic Panel:  Recent Labs Lab 06/07/12 1920  NA 140  K 4.4  CL 102  CO2 28  GLUCOSE 81  BUN 32*  CREATININE 1.18  CALCIUM 9.7   Liver Function Tests:  Recent Labs Lab 06/07/12 1920  AST 24  ALT 16  ALKPHOS 56  BILITOT 0.2*  PROT 7.2  ALBUMIN 4.3   CBC:  Recent Labs Lab 06/07/12 1920  WBC 7.2  NEUTROABS 5.0  HGB 14.3  HCT 42.1  MCV 93.6  PLT 202   Coagulation: No results found for this basename: LABPROT, INR,  in the last 168 hours Cardiac Enzymes:  Recent Labs Lab 06/07/12 1920  TROPONINI <0.30   Urinalysis:  Recent Labs Lab 06/07/12 2141  COLORURINE YELLOW  LABSPEC 1.028  PHURINE 6.0  GLUCOSEU NEGATIVE  HGBUR NEGATIVE  BILIRUBINUR NEGATIVE  KETONESUR 15*  PROTEINUR NEGATIVE  UROBILINOGEN 0.2  NITRITE NEGATIVE  LEUKOCYTESUR NEGATIVE   Lipid Panel    Component Value Date/Time   CHOL 149 06/08/2012 0555   TRIG 64 06/08/2012 0555   HDL 57 06/08/2012 0555   CHOLHDL 2.6 06/08/2012 0555   VLDL 13 06/08/2012 0555   LDLCALC 79 06/08/2012 0555   HgbA1C  Lab Results  Component Value Date  HGBA1C 5.5 06/08/2012    Urine Drug Screen:     Component Value Date/Time   LABOPIA NONE DETECTED 06/09/2011 2146   COCAINSCRNUR NONE DETECTED 06/09/2011 2146   LABBENZ NONE DETECTED 06/09/2011 2146   AMPHETMU NONE DETECTED 06/09/2011 2146   THCU NONE DETECTED 06/09/2011 2146   LABBARB POSITIVE* 06/09/2011 2146    Alcohol Level: No results found for this basename: ETH,  in the last 168 hours  CT of the brain  06/07/2012  No skull fracture, intracranial hemorrhage or other findings of acute abnormality trast  MRI of the brain  06/08/2012   Tiny acute non hemorrhagic infarct posterior left lenticular nucleus.   MRA of the brain  06/08/2012  Intracranial atherosclerotic type changes as noted above most notable involving the posterior cerebral arteries.  2D Echocardiogram  EF 55-60% with no source of embolus.   Carotid Doppler  No  evidence of hemodynamically significant internal carotid artery stenosis. Vertebral artery flow is antegrade.   CXR  06/07/2012  1.  Bronchitic change. 2. No focal pulmonary abnormality.    EKG  sinus bradycardia.   Therapy Recommendations HH OT and PT  Physical Exam   Pleasant middle aged caucasian male not in distress.Awake alert. Afebrile. Head is nontraumatic. Neck is supple without bruit. Hearing is normal. Cardiac exam no murmur or gallop. Lungs are clear to auscultation. Distal pulses are well felt. Neurological Exam ; Awake  Alert oriented x 3. Normal speech and language.eye movements full without nystagmus.fundi were not visualized. Vision acuity and fields appear normal. Hearing is normal. Palatal moments are normal. Face symmetric. Tongue midline. Normal strength, tone, reflexes and coordination. Normal sensation. Gait broad based uses a walker favors right leg due to rib cage pain.. ASSESSMENT [name] is a 67 y.o. male presenting with progressive difficulty walking and confusion. Imaging confirms a left tiny lenticular nucleus infarct. Infarct felt to be thrombotic secondary to small vessel disease.  On clopidogrel 75 mg orally every day prior to admission. Now on clopidogrel 75 mg orally every day for secondary stroke prevention. Patient with no resultant weakness, just splinting right leg due to pain. Stroke work up completed.    Right rib fx from fall last week. Started on hydrocodone for pain  Confusion, worse since starting hydrocodone 3-4 days ago, ? Baseline  Hyperlipidemia, LDL 79, on statin PTA, on lipitor now, at goal LDL < 100  HgbA1c 5.5  Seizures on phenobarb and Keppra  Hx brain tumor - glioma  Hx TIA 01/2011  [facility]  Hx right thalamic stroke  Hospital day # 2  TREATMENT/PLAN  Continue clopidogrel 75 mg orally every day for secondary stroke prevention. (was changed to plavix from aggrenox after last stroke)  Outpatient PT and OT (I  ordered)  Ok for discharge from neuro standpoint No further stroke workup indicated. Patient has a 10-15% risk of having another stroke over the next year, the highest risk is within 2 weeks of the most recent stroke/TIA (risk of having a stroke following a stroke or TIA is the same). Ongoing risk factor control by Primary Care Physician Stroke Service will sign off. Please call should any needs arise. Follow up with Dr. Pearlean Brownie, Stroke Clinic, in 2 weeks (appt already scheduled).   Annie Main, MSN, RN, ANVP-BC, ANP-BC, [facility] Pager: [phone] 06/09/2012 9:23 AM  I have personally obtained a history, examined the patient, evaluated imaging results, and formulated the assessment and plan of care. I agree with the  above.  Delia Heady, MD

## 2012-06-26 ENCOUNTER — Ambulatory Visit: Payer: Medicare Other | Admitting: Physical Therapy

## 2012-06-26 ENCOUNTER — Ambulatory Visit: Payer: Medicare Other | Attending: Nurse Practitioner | Admitting: Occupational Therapy

## 2012-06-26 DIAGNOSIS — R269 Unspecified abnormalities of gait and mobility: Secondary | ICD-10-CM | POA: Insufficient documentation

## 2012-06-26 DIAGNOSIS — IMO0001 Reserved for inherently not codable concepts without codable children: Secondary | ICD-10-CM | POA: Insufficient documentation

## 2012-06-30 ENCOUNTER — Ambulatory Visit: Payer: Medicare Other | Admitting: Physical Therapy

## 2012-07-02 ENCOUNTER — Ambulatory Visit (INDEPENDENT_AMBULATORY_CARE_PROVIDER_SITE_OTHER): Payer: Medicare Other | Admitting: Neurology

## 2012-07-02 ENCOUNTER — Ambulatory Visit: Payer: Medicare Other | Admitting: Physical Therapy

## 2012-07-02 ENCOUNTER — Encounter: Payer: Self-pay | Admitting: Neurology

## 2012-07-02 VITALS — BP 93/66 | HR 70 | Temp 97.7°F | Ht 69.0 in | Wt 170.0 lb

## 2012-07-02 DIAGNOSIS — R269 Unspecified abnormalities of gait and mobility: Secondary | ICD-10-CM | POA: Insufficient documentation

## 2012-07-02 DIAGNOSIS — Z79899 Other long term (current) drug therapy: Secondary | ICD-10-CM | POA: Insufficient documentation

## 2012-07-02 DIAGNOSIS — R413 Other amnesia: Secondary | ICD-10-CM

## 2012-07-02 DIAGNOSIS — R4701 Aphasia: Secondary | ICD-10-CM

## 2012-07-02 DIAGNOSIS — I6350 Cerebral infarction due to unspecified occlusion or stenosis of unspecified cerebral artery: Secondary | ICD-10-CM

## 2012-07-02 MED ORDER — DONEPEZIL HCL 5 MG PO TABS: 5.0000 mg | ORAL_TABLET | Freq: Every day | ORAL | Status: DC

## 2012-07-02 NOTE — Patient Instructions (Addendum)
Continue Plavix for stroke prevention with strict control of lipids with LDL cholesterol goal below 100 mg percent. Continue Keppra and phenobarbital for seizures.Add aricept 5 mg daily for memory loss. Referred to speech therapy for cognitive therapy. Return for followup in 2 months with Larita Fife, nurse practitioner

## 2012-07-02 NOTE — Progress Notes (Signed)
[facility] [address] [place]. [place] [address] [phone]       OFFICE FOLLOW-UP NOTE  [name] Date of Birth:  [birth year] Medical Record Number:  [id number]   HPI: 67 year old male with remote history of right frontal glioma status post surgical resection in 1982 followed by chemotherapy and radiation as well as seizures which seem to be well controlled. History of TIA 09/21/11  He returns for followup last visit on 09/28/11. He continues to have gait and balance problems and has had several falls particularly last 1 month. He has also had some progressive memory loss and cognitive worsening as per his wife. He was admitted to [facility] earlier this month on 06/07/12 following a fall and MRI scan of the brain showed a left internal capsule small acute infarct  felt to be due to small vessel disease. He was advised to continue Plavix and Lipitor. He states he has been participating in outpatient physical therapy and feels his balance is slightly improved. He has not had any breakthrough seizures now for several years. He has not tried any medications to help improve his memory. ROS:   14 system review of systems is positive for fatigue, incontinence, cough, importance, easy bruising and bleeding, memory loss, confusion, weakness and decreased energy  PMH:  Past Medical History  Diagnosis Date  . Brain tumor     glioma  . Cancer     PROSTATE CANCER  . TIA (transient ischemic attack)     APPROX N2 MONTHS AGO 01/2011  . Sleep apnea   . IBS (irritable bowel syndrome)   . Shingles   . Cataract   . Seizures   . CVA (cerebral infarction)     R thalamic  . Stroke     Social History:  History   Social History  . Marital Status: Married    Spouse Name: N/A    Number of Children: N/A  . Years of Education: N/A   Occupational History  . Not on file.   Social History Main Topics  . Smoking status: Former Games developer  . Smokeless tobacco: Not on  file  . Alcohol Use: 3.0 oz/week    5 Glasses of wine per week  . Drug Use: Not on file  . Sexually Active: No   Other Topics Concern  . Not on file   Social History Narrative  . No narrative on file    Medications:   Current Outpatient Prescriptions on File Prior to Visit  Medication Sig Dispense Refill  . atorvastatin (LIPITOR) 40 MG tablet Take 40 mg by mouth daily.      . calcium-vitamin D (OSCAL WITH D) 500-200 MG-UNIT per tablet Take 1 tablet by mouth daily.      . clopidogrel (PLAVIX) 75 MG tablet Take 1 tablet (75 mg total) by mouth now.  30 tablet  0  . levETIRAcetam (KEPPRA) 1000 MG tablet Take 500-1,000 mg by mouth 2 (two) times daily. 500 mg in the am and 1000 mg at bedtime.      Marland Kitchen PHENobarbital (LUMINAL) 97.2 MG tablet Take 145.8 mg by mouth at bedtime.      Marland Kitchen UNABLE TO FIND OUTPATIENT PHYSICAL THERAPY OUTPATIENT OCCUPATIONAL THERAPY   Diagnosis: Acute CVA, seizures, gait instability  1 Mutually Defined  1   No current facility-administered medications on file prior to visit.    Allergies:   Allergies  Allergen Reactions  . Amoxicillin Diarrhea  . Dilaudid (Hydromorphone Hcl) Other (See  Comments)    DELUSIONS  . Lactose Intolerance (Gi) Diarrhea  . Sulfa Antibiotics Other (See Comments)    Cant control bladder   Filed Vitals:   07/02/12 1547  BP: 93/66  Pulse: 70  Temp: 97.7 F (36.5 C)    Physical Exam General: well developed, well nourished middle-aged Caucasian, seated, in no evident distress Head: head normocephalic and atraumatic. Orohparynx benign Neck: supple with no carotid or supraclavicular bruits Cardiovascular: regular rate and rhythm, no murmurs Musculoskeletal: no deformity Skin:  no rash/petichiae Vascular:  Normal pulses all extremities  Neurologic Exam Mental Status: Awake and fully alert. Oriented to place and time. Recent and remote memory intact. Attention span, concentration and fund of knowledge appropriate. Mood and affect  appropriate. Mini-Mental status exam 24/30 with deficits in orientation, recall and 3 step commands. Animal fluency test could 7. Clock drawing 1/4. Geriatric depression scale 4 ( not depressed) Cranial Nerves: Fundoscopic exam reveals sharp disc margins. Pupils equal, briskly reactive to light. Extraocular movements full without nystagmus. Visual fields full to confrontation. Hearing intact. Facial sensation intact. Face, tongue, palate moves normally and symmetrically.  Motor: Normal bulk and tone. Normal strength in all tested extremity muscles. Diminished fine finger movements on the left. Orbits right over left upper extremity. Tone is slightly increased in both legs left more than Sensory.: intact to tough and pinprick and vibratory.  Coordination: Rapid alternating movements normal in all extremities. Finger-to-nose and heel-to-shin performed accurately bilaterally. Gait and Station: Arises from chair without difficulty. Stance is normal. Gait demonstrates normal stride length but mild imbalance .Not able to heel, toe and tandem walk without difficulty.  Reflexes: 2+ and symmetric. Toes downgoing.     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    PLAN: Continue Plavix for second stroke prevention and strict control of lipids with LDL cholesterol goal below 100 mg percent. Continue Keppra and phenobarbital for seizures. Continue physical therapy for gait and balance training and referred to speech therapy for cognitive rehabilitation. Trial of Aricept 5 mg daily for memory loss and  increase as tolerated in the future without side  effects. Return for followup in 2 months with Rodney Potts, nurse practitioner

## 2012-07-08 ENCOUNTER — Ambulatory Visit: Payer: Medicare Other | Attending: Nurse Practitioner | Admitting: Physical Therapy

## 2012-07-08 ENCOUNTER — Ambulatory Visit: Payer: Medicare Other | Admitting: Occupational Therapy

## 2012-07-08 DIAGNOSIS — R269 Unspecified abnormalities of gait and mobility: Secondary | ICD-10-CM | POA: Insufficient documentation

## 2012-07-08 DIAGNOSIS — IMO0001 Reserved for inherently not codable concepts without codable children: Secondary | ICD-10-CM | POA: Insufficient documentation

## 2012-07-10 ENCOUNTER — Ambulatory Visit: Payer: Medicare Other | Attending: Neurology

## 2012-07-10 ENCOUNTER — Ambulatory Visit: Payer: Medicare Other | Admitting: Physical Therapy

## 2012-07-10 ENCOUNTER — Ambulatory Visit: Payer: Medicare Other | Admitting: Occupational Therapy

## 2012-07-10 ENCOUNTER — Ambulatory Visit: Payer: Medicare Other

## 2012-07-10 DIAGNOSIS — R4189 Other symptoms and signs involving cognitive functions and awareness: Secondary | ICD-10-CM | POA: Insufficient documentation

## 2012-07-10 DIAGNOSIS — R279 Unspecified lack of coordination: Secondary | ICD-10-CM | POA: Insufficient documentation

## 2012-07-10 DIAGNOSIS — R41841 Cognitive communication deficit: Secondary | ICD-10-CM | POA: Insufficient documentation

## 2012-07-10 DIAGNOSIS — R471 Dysarthria and anarthria: Secondary | ICD-10-CM | POA: Insufficient documentation

## 2012-07-10 DIAGNOSIS — Z5189 Encounter for other specified aftercare: Secondary | ICD-10-CM | POA: Insufficient documentation

## 2012-07-10 DIAGNOSIS — R269 Unspecified abnormalities of gait and mobility: Secondary | ICD-10-CM | POA: Insufficient documentation

## 2012-07-10 DIAGNOSIS — M6281 Muscle weakness (generalized): Secondary | ICD-10-CM | POA: Insufficient documentation

## 2012-07-15 ENCOUNTER — Ambulatory Visit: Payer: Medicare Other | Admitting: Physical Therapy

## 2012-07-15 ENCOUNTER — Ambulatory Visit: Payer: Medicare Other | Admitting: Occupational Therapy

## 2012-07-17 ENCOUNTER — Ambulatory Visit: Payer: Medicare Other | Admitting: Occupational Therapy

## 2012-07-17 ENCOUNTER — Ambulatory Visit: Payer: Medicare Other | Admitting: Physical Therapy

## 2012-07-21 ENCOUNTER — Ambulatory Visit: Payer: Medicare Other | Admitting: Physical Therapy

## 2012-07-21 ENCOUNTER — Ambulatory Visit: Payer: Medicare Other | Admitting: Occupational Therapy

## 2012-07-24 ENCOUNTER — Ambulatory Visit: Payer: Medicare Other | Admitting: Physical Therapy

## 2012-07-24 ENCOUNTER — Ambulatory Visit: Payer: Medicare Other | Admitting: Occupational Therapy

## 2012-07-25 ENCOUNTER — Other Ambulatory Visit: Payer: Self-pay

## 2012-07-25 DIAGNOSIS — R413 Other amnesia: Secondary | ICD-10-CM

## 2012-07-25 MED ORDER — DONEPEZIL HCL 5 MG PO TABS: 5.0000 mg | ORAL_TABLET | Freq: Every day | ORAL | Status: DC

## 2012-07-28 ENCOUNTER — Other Ambulatory Visit: Payer: Self-pay | Admitting: Neurology

## 2012-07-29 ENCOUNTER — Ambulatory Visit: Payer: Medicare Other | Admitting: Physical Therapy

## 2012-07-29 ENCOUNTER — Ambulatory Visit: Payer: Medicare Other | Admitting: Occupational Therapy

## 2012-07-31 ENCOUNTER — Ambulatory Visit: Payer: Medicare Other | Admitting: Occupational Therapy

## 2012-07-31 ENCOUNTER — Ambulatory Visit: Payer: Medicare Other

## 2012-07-31 ENCOUNTER — Ambulatory Visit: Payer: Medicare Other | Admitting: Physical Therapy

## 2012-08-04 ENCOUNTER — Ambulatory Visit: Payer: Medicare Other | Attending: Nurse Practitioner | Admitting: Physical Therapy

## 2012-08-04 DIAGNOSIS — R269 Unspecified abnormalities of gait and mobility: Secondary | ICD-10-CM | POA: Insufficient documentation

## 2012-08-04 DIAGNOSIS — IMO0001 Reserved for inherently not codable concepts without codable children: Secondary | ICD-10-CM | POA: Insufficient documentation

## 2012-08-07 ENCOUNTER — Ambulatory Visit: Payer: Medicare Other | Admitting: Physical Therapy

## 2012-08-07 ENCOUNTER — Ambulatory Visit: Payer: Medicare Other

## 2012-08-11 ENCOUNTER — Ambulatory Visit: Payer: Medicare Other | Admitting: Physical Therapy

## 2012-08-12 ENCOUNTER — Ambulatory Visit: Payer: Medicare Other | Admitting: Physical Therapy

## 2012-08-12 ENCOUNTER — Encounter: Payer: Medicare Other | Admitting: Occupational Therapy

## 2012-08-13 ENCOUNTER — Ambulatory Visit: Payer: Medicare Other

## 2012-08-13 ENCOUNTER — Ambulatory Visit: Payer: Medicare Other | Admitting: Physical Therapy

## 2012-08-14 ENCOUNTER — Ambulatory Visit: Payer: Medicare Other | Admitting: Rehabilitative and Restorative Service Providers"

## 2012-08-14 ENCOUNTER — Ambulatory Visit: Payer: Medicare Other

## 2012-08-14 ENCOUNTER — Encounter: Payer: Medicare Other | Admitting: Occupational Therapy

## 2012-09-02 ENCOUNTER — Ambulatory Visit: Payer: Medicare Other | Admitting: Physical Therapy

## 2012-09-02 ENCOUNTER — Encounter: Payer: Medicare Other | Admitting: Occupational Therapy

## 2012-09-02 ENCOUNTER — Ambulatory Visit: Payer: Medicare Other | Attending: Nurse Practitioner

## 2012-09-02 DIAGNOSIS — R269 Unspecified abnormalities of gait and mobility: Secondary | ICD-10-CM | POA: Insufficient documentation

## 2012-09-02 DIAGNOSIS — IMO0001 Reserved for inherently not codable concepts without codable children: Secondary | ICD-10-CM | POA: Insufficient documentation

## 2012-09-04 ENCOUNTER — Ambulatory Visit: Payer: Medicare Other

## 2012-09-04 ENCOUNTER — Encounter: Payer: Medicare Other | Admitting: Occupational Therapy

## 2012-09-04 ENCOUNTER — Ambulatory Visit: Payer: Medicare Other | Admitting: Physical Therapy

## 2012-09-08 ENCOUNTER — Encounter: Payer: Medicare Other | Admitting: Occupational Therapy

## 2012-09-08 ENCOUNTER — Ambulatory Visit: Payer: Medicare Other | Admitting: Physical Therapy

## 2012-09-08 ENCOUNTER — Ambulatory Visit: Payer: Medicare Other

## 2012-09-10 ENCOUNTER — Ambulatory Visit: Payer: Medicare Other

## 2012-09-10 ENCOUNTER — Encounter: Payer: Medicare Other | Admitting: Occupational Therapy

## 2012-09-10 ENCOUNTER — Ambulatory Visit: Payer: Medicare Other | Admitting: Physical Therapy

## 2012-09-24 ENCOUNTER — Ambulatory Visit: Payer: Medicare Other

## 2012-09-24 ENCOUNTER — Ambulatory Visit: Payer: Medicare Other | Admitting: Physical Therapy

## 2012-09-26 ENCOUNTER — Ambulatory Visit: Payer: Medicare Other

## 2012-09-26 ENCOUNTER — Ambulatory Visit: Payer: Medicare Other | Admitting: Physical Therapy

## 2012-09-30 ENCOUNTER — Ambulatory Visit: Payer: Medicare Other | Admitting: Physical Therapy

## 2012-10-01 ENCOUNTER — Ambulatory Visit (INDEPENDENT_AMBULATORY_CARE_PROVIDER_SITE_OTHER): Payer: Medicare Other | Admitting: Nurse Practitioner

## 2012-10-01 ENCOUNTER — Encounter: Payer: Self-pay | Admitting: Nurse Practitioner

## 2012-10-01 VITALS — BP 104/70 | HR 73 | Ht 69.5 in | Wt 176.0 lb

## 2012-10-01 DIAGNOSIS — R569 Unspecified convulsions: Secondary | ICD-10-CM

## 2012-10-01 DIAGNOSIS — I639 Cerebral infarction, unspecified: Secondary | ICD-10-CM

## 2012-10-01 DIAGNOSIS — R269 Unspecified abnormalities of gait and mobility: Secondary | ICD-10-CM

## 2012-10-01 DIAGNOSIS — R413 Other amnesia: Secondary | ICD-10-CM

## 2012-10-01 DIAGNOSIS — I6350 Cerebral infarction due to unspecified occlusion or stenosis of unspecified cerebral artery: Secondary | ICD-10-CM

## 2012-10-01 NOTE — Patient Instructions (Addendum)
Continue clopidogrel 75 mg orally every day  for secondary stroke prevention and maintain strict control of hypertension with blood pressure goal below 130/90, diabetes with hemoglobin A1c goal below 6.5% and lipids with LDL cholesterol goal below 100 mg/dL.  Continue Keppra and phenobarbital for seizures. Continue physical therapy for gait and balance training  Continue Aricept 5 mg daily for memory loss. Return for followup in 6 months with Larita Fife, nurse practitioner.  STROKE/TIA INSTRUCTIONS SMOKING Cigarette smoking nearly doubles your risk of having a stroke & is the single most alterable risk factor  If you smoke or have smoked in the last 12 months, you are advised to quit smoking for your health.  Most of the excess cardiovascular risk related to smoking disappears within a year of stopping.  Ask you doctor about anti-smoking medications  [place] Quit Line: 1-800-QUIT NOW  Free Smoking Cessation Classes ([phone]  CHOLESTEROL Know your levels; limit fat & cholesterol in your diet  Lab Results  Component Value Date   CHOL 149 06/08/2012   HDL 57 06/08/2012   LDLCALC 79 06/08/2012   TRIG 64 06/08/2012   CHOLHDL 2.6 06/08/2012      Many patients benefit from treatment even if their cholesterol is at goal.  Goal: Total Cholesterol less than 160  Goal:  LDL less than 100  Goal:  HDL greater than 40  Goal:  Triglycerides less than 150  BLOOD PRESSURE American Stroke Association blood pressure target is less that 120/80 mm/Hg  Your discharge blood pressure is:  BP: 104/70 mmHg  Monitor your blood pressure  Limit your salt and alcohol intake  Many individuals will require more than one medication for high blood pressure  DIABETES (A1c is a blood sugar average for last 3 months) Goal A1c is under 7% (A1c is blood sugar average for last 3 months)  Diabetes: No known diagnosis of diabetes    Lab Results  Component Value Date   HGBA1C 5.5 06/08/2012    Your A1c can be lowered with  medications, healthy diet, and exercise.  Check your blood sugar as directed by your physician  Call your physician if you experience unexplained or low blood sugars.  PHYSICAL ACTIVITY/REHABILITATION Goal is 30 minutes at least 4 days per week    Activity decreases your risk of heart attack and stroke and makes your heart stronger.  It helps control your weight and blood pressure; helps you relax and can improve your mood.  Participate in a regular exercise program.  Talk with your doctor about the best form of exercise for you (dancing, walking, swimming, cycling).  DIET/WEIGHT Goal is to maintain a healthy weight  Your height is:  Height: 5' 9.5" (176.5 cm) Your current weight is: Weight: 176 lb (79.833 kg) Your body Mass Index (BMI) is:  BMI (Calculated): 25.7  Following the type of diet specifically designed for you will help prevent another stroke.  Your goal Body Mass Index (BMI) is 19-24.  Healthy food habits can help reduce 3 risk factors for stroke:  High cholesterol, hypertension, and excess weight.

## 2012-10-01 NOTE — Progress Notes (Signed)
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 History of TIA 09/21/11  He returns for followup last visit on 09/28/11. He continues to have gait and balance problems and has had several falls particularly last 1 month. He has also had some progressive memory loss and cognitive worsening as per his wife. He was admitted to [facility] earlier this month on 06/07/12 following a fall and MRI scan of the brain showed a left internal capsule small acute infarct felt to be due to small vessel disease. He was advised to continue Plavix and Lipitor. He states he has been participating in outpatient physical therapy and feels his balance is slightly improved. He has not had any breakthrough seizures now for several years. He has not tried any medications to help improve his memory.  UPDATE 10/01/12 (LL):  Patient returns for 2 month revisit, last stroke on 06/07/12.  On Plavix for secondary stroke prevention,  patient denies medication side effects, with no signs of extensive bruising.  On Keppra and Phenobarbital for seizure prophylaxis, has not had a seizure in many years.  Phenobarbital dose was cut in half at last hospitalization.  Patient has completed outpatient therapy.  Started Aricept 5 mg at last visit, tolerating well.  Had bad fall at home earlier this week, tripped and fell forward.  Eyeglasses lacerated his left upper brow, went to urgent care and received stitches and head CT.  Wife c/o of excessive sleepiness.  MMSE last visit 24, today is 28.  REVIEW OF SYSTEMS: Full 14 system review of systems performed and notable only for: constitutional: fatigue cardiovascular:  N/A respiratory: N/A endocrine: N/A  ear/nose/throat: N/A  musculoskeletal: N/A skin: N/A genitourinary: N/A Gastrointestinal: Incontinence, Diarrhea Genitourinary: Incontinence,urination problems Hematology: easy bruising, easy bleeding allergy/immunology: N/A neurological: memory loss  sleep: N/A psychiatric: N/A   ALLERGIES: Allergies  Allergen Reactions  . Amoxicillin Diarrhea  . Dilaudid (Hydromorphone Hcl) Other (See Comments)    DELUSIONS  . Lactose Intolerance (Gi) Diarrhea  . Sulfa Antibiotics Other (See Comments)    Cant control bladder    HOME MEDICATIONS: Outpatient Prescriptions Prior to Visit  Medication Sig Dispense Refill  . atorvastatin (LIPITOR) 40 MG tablet Take 40 mg by mouth daily.      . benzonatate (TESSALON) 200 MG capsule Take 200 mg by mouth 2 (two) times daily with a meal.      . calcium-vitamin D (OSCAL WITH D) 500-200 MG-UNIT per tablet Take 1 tablet by mouth daily.      Marland Kitchen donepezil (ARICEPT) 5 MG tablet Take 1 tablet (5 mg total) by mouth at bedtime.  90 tablet  0  . levETIRAcetam (KEPPRA) 500 MG tablet TAKE 1 TABLET BY MOUTH EVERY MORNING AND 2 TABLETS AT NIGHT  90 tablet  6  . PHENobarbital (LUMINAL) 97.2 MG tablet Take 145.8 mg by mouth at bedtime.      Marland Kitchen UNABLE TO FIND OUTPATIENT PHYSICAL THERAPY OUTPATIENT OCCUPATIONAL THERAPY   Diagnosis: Acute CVA, seizures, gait instability  1 Mutually Defined  1   No facility-administered medications prior to visit.    PAST MEDICAL HISTORY: Past Medical History  Diagnosis Date  . Brain tumor     glioma  .  Cancer     PROSTATE CANCER  . TIA (transient ischemic attack)     APPROX N2 MONTHS AGO 01/2011  . Sleep apnea   . IBS (irritable bowel syndrome)   . Shingles   . Cataract   . Seizures   . CVA (cerebral infarction)     R thalamic  . Stroke     PAST SURGICAL HISTORY: Past Surgical History  Procedure Laterality Date  . Eye surgery      CATARACT REMOVAL  . Joint replacement       BILATERAL KNEE REPLACEMENT  . Prostatectomy    . Palate surgery    . Shoulder arthroscopy      FAMILY HISTORY: Family History  Problem Relation Age of Onset  . Hypertension Father   . Transient ischemic attack Mother     SOCIAL HISTORY: History   Social History  . Marital Status: Married    Spouse Name: N/A    Number of Children: N/A  . Years of Education: N/A   Occupational History  . Not on file.   Social History Main Topics  . Smoking status: Former Games developer  . Smokeless tobacco: Not on file  . Alcohol Use: 3.0 oz/week    5 Glasses of wine per week  . Drug Use: Not on file  . Sexually Active: No   Other Topics Concern  . Not on file   Social History Narrative  . No narrative on file     PHYSICAL EXAM  Filed Vitals:   10/01/12 1356  BP: 104/70  Pulse: 73  Height: 5' 9.5" (1.765 m)  Weight: 176 lb (79.833 kg)   Body mass index is 25.63 kg/(m^2).  Generalized: In no acute distress   Neck: Supple, no carotid bruits   Cardiac: Regular rate rhythm, no murmur   Pulmonary: Clear to auscultation bilaterally   Musculoskeletal: No deformity   Neurological examination   Mentation: Alert oriented to time, place, history taking, language fluent, and casual conversation. MMSE 28/30, AFT 17, Clock drawing 3/4.  (Last visit 24/30, AFT 7, Clock drawing 1/4). Geriatric Depression scale 4 (does not suggest depression).  Cranial nerve II-XII: Pupils were equal round reactive to light extraocular movements were full, visual field were full on confrontational test. facial sensation and strength were normal. hearing was intact to finger rubbing bilaterally. Uvula tongue midline. head turning and shoulder shrug and were normal and symmetric.Tongue protrusion into cheek strength was normal. MOTOR: normal bulk and tone, full strength in the BUE, BLE, fine finger movements decreased on the left,Orbits right over left upper extremity. Tone is slightly increased in both legs,  L>R. no pronator drift SENSORY: normal and symmetric to light touch COORDINATION: finger-nose-finger, heel-to-shin normal bilaterally, there was no truncal ataxia REFLEXES: Brachioradialis 2/2, biceps 2/2, triceps 2/2, patellar 2/2, Achilles 2/2. GAIT/STATION: Rising up from seated position without assistance, normal stance, without trunk ataxia, moderate stride, mild imbalance, able to perform tiptoe, not able to heel walk or tandem without difficulty.   DIAGNOSTIC DATA (LABS, IMAGING, TESTING) - I reviewed patient records, labs, notes, testing and imaging myself where available.  Lab Results  Component Value Date   WBC 7.2 06/07/2012   HGB 14.3 06/07/2012   HCT 42.1 06/07/2012   MCV 93.6 06/07/2012   PLT 202 06/07/2012      Component Value Date/Time   NA 140 06/07/2012 1920   K 4.4 06/07/2012 1920   CL 102 06/07/2012 1920   CO2 28 06/07/2012 1920   GLUCOSE 81  06/07/2012 1920   BUN 32* 06/07/2012 1920   CREATININE 1.18 06/07/2012 1920   CALCIUM 9.7 06/07/2012 1920   PROT 7.2 06/07/2012 1920   ALBUMIN 4.3 06/07/2012 1920   AST 24 06/07/2012 1920   ALT 16 06/07/2012 1920   ALKPHOS 56 06/07/2012 1920   BILITOT 0.2* 06/07/2012 1920   GFRNONAA 63* 06/07/2012 1920   GFRAA 72* 06/07/2012 1920   Lab Results  Component Value Date   CHOL 149 06/08/2012   HDL 57 06/08/2012   LDLCALC 79 06/08/2012   TRIG 64 06/08/2012   CHOLHDL 2.6 06/08/2012   Lab Results  Component Value Date   HGBA1C 5.5 06/08/2012   Lab Results  Component Value Date   VITAMINB12 439 06/08/2012   Lab Results  Component Value Date   TSH 3.661 06/08/2012    MRI HEAD Wo CONTRAST 06/08/12 Tiny acute non hemorrhagic infarct posterior left lenticular nucleus. Remote right thalamic infarct.  Right frontal parenchymal changes and calcification may represent result of treated tumor and without change.  MRA HEAD 06/08/12 Intracranial atherosclerotic type changes as noted above most notable involving the posterior cerebral arteries.  CAROTID DOPPLER  06/09/12 Bilateral: mild soft plaque origin ICA. No signficant ICA stenosis. Vertebral artery flow is antegrade.  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  Continue clopidogrel 75 mg orally every day  for secondary stroke prevention and maintain strict control of hypertension with blood pressure goal below 130/90, diabetes with hemoglobin A1c goal below 6.5% and lipids with LDL cholesterol goal below 100 mg/dL.  Continue Keppra and phenobarbital for seizures. Continue physical therapy for gait and balance training  Continue Aricept 5 mg daily for memory loss. Return for followup in 6 months with Larita Fife, nurse practitioner.  LYNN LAM NP-C 10/01/2012, 2:31 PM  [facility] [address] [place], [place] [address] [phone]  I have personally examined this patient, reviewed pertinent data, developed plan of care and discussed with patient and agree with above.  Delia Heady, MD

## 2012-10-02 ENCOUNTER — Ambulatory Visit: Payer: Medicare Other | Admitting: Physical Therapy

## 2012-10-07 ENCOUNTER — Ambulatory Visit: Payer: Medicare Other | Attending: Nurse Practitioner | Admitting: Physical Therapy

## 2012-10-07 DIAGNOSIS — IMO0001 Reserved for inherently not codable concepts without codable children: Secondary | ICD-10-CM | POA: Insufficient documentation

## 2012-10-07 DIAGNOSIS — R269 Unspecified abnormalities of gait and mobility: Secondary | ICD-10-CM | POA: Insufficient documentation

## 2012-10-08 ENCOUNTER — Other Ambulatory Visit: Payer: Self-pay | Admitting: Neurology

## 2012-10-09 ENCOUNTER — Ambulatory Visit: Payer: Medicare Other | Admitting: Physical Therapy

## 2012-10-14 ENCOUNTER — Ambulatory Visit: Payer: Medicare Other | Admitting: Physical Therapy

## 2012-10-16 ENCOUNTER — Ambulatory Visit: Payer: Medicare Other | Admitting: Physical Therapy

## 2012-10-20 ENCOUNTER — Ambulatory Visit: Payer: Medicare Other | Admitting: Physical Therapy

## 2012-10-23 ENCOUNTER — Ambulatory Visit: Payer: Medicare Other | Admitting: Physical Therapy

## 2012-10-26 ENCOUNTER — Other Ambulatory Visit: Payer: Self-pay | Admitting: Neurology

## 2012-10-27 ENCOUNTER — Ambulatory Visit: Payer: Medicare Other | Admitting: Physical Therapy

## 2012-10-30 ENCOUNTER — Ambulatory Visit: Payer: Medicare Other | Admitting: Physical Therapy

## 2012-11-14 ENCOUNTER — Emergency Department (HOSPITAL_COMMUNITY): Payer: Medicare Other

## 2012-11-14 ENCOUNTER — Emergency Department (HOSPITAL_COMMUNITY)
Admission: EM | Admit: 2012-11-14 | Discharge: 2012-11-14 | Disposition: A | Discharge status: Home / Self Care | Payer: Medicare Other | Attending: Emergency Medicine | Admitting: Emergency Medicine

## 2012-11-14 ENCOUNTER — Encounter (HOSPITAL_COMMUNITY): Payer: Self-pay | Admitting: Neurology

## 2012-11-14 DIAGNOSIS — Z8673 Personal history of transient ischemic attack (TIA), and cerebral infarction without residual deficits: Secondary | ICD-10-CM | POA: Insufficient documentation

## 2012-11-14 DIAGNOSIS — S0191XA Laceration without foreign body of unspecified part of head, initial encounter: Secondary | ICD-10-CM

## 2012-11-14 DIAGNOSIS — Z8546 Personal history of malignant neoplasm of prostate: Secondary | ICD-10-CM | POA: Insufficient documentation

## 2012-11-14 DIAGNOSIS — W19XXXA Unspecified fall, initial encounter: Secondary | ICD-10-CM

## 2012-11-14 DIAGNOSIS — Y9289 Other specified places as the place of occurrence of the external cause: Secondary | ICD-10-CM | POA: Insufficient documentation

## 2012-11-14 DIAGNOSIS — Z85841 Personal history of malignant neoplasm of brain: Secondary | ICD-10-CM | POA: Insufficient documentation

## 2012-11-14 DIAGNOSIS — S0093XA Contusion of unspecified part of head, initial encounter: Secondary | ICD-10-CM

## 2012-11-14 DIAGNOSIS — G40909 Epilepsy, unspecified, not intractable, without status epilepticus: Secondary | ICD-10-CM | POA: Insufficient documentation

## 2012-11-14 DIAGNOSIS — Z7902 Long term (current) use of antithrombotics/antiplatelets: Secondary | ICD-10-CM | POA: Insufficient documentation

## 2012-11-14 DIAGNOSIS — Y939 Activity, unspecified: Secondary | ICD-10-CM | POA: Insufficient documentation

## 2012-11-14 DIAGNOSIS — W010XXA Fall on same level from slipping, tripping and stumbling without subsequent striking against object, initial encounter: Secondary | ICD-10-CM | POA: Insufficient documentation

## 2012-11-14 DIAGNOSIS — S0100XA Unspecified open wound of scalp, initial encounter: Secondary | ICD-10-CM | POA: Insufficient documentation

## 2012-11-14 DIAGNOSIS — Z8619 Personal history of other infectious and parasitic diseases: Secondary | ICD-10-CM | POA: Insufficient documentation

## 2012-11-14 DIAGNOSIS — S0003XA Contusion of scalp, initial encounter: Secondary | ICD-10-CM | POA: Insufficient documentation

## 2012-11-14 DIAGNOSIS — Z8719 Personal history of other diseases of the digestive system: Secondary | ICD-10-CM | POA: Insufficient documentation

## 2012-11-14 DIAGNOSIS — Z8669 Personal history of other diseases of the nervous system and sense organs: Secondary | ICD-10-CM | POA: Insufficient documentation

## 2012-11-14 DIAGNOSIS — Z79899 Other long term (current) drug therapy: Secondary | ICD-10-CM | POA: Insufficient documentation

## 2012-11-14 DIAGNOSIS — Z87891 Personal history of nicotine dependence: Secondary | ICD-10-CM | POA: Insufficient documentation

## 2012-11-14 NOTE — ED Notes (Signed)
Pt bleeding through dressing. MD to bedside to assess, quick clot applied.

## 2012-11-14 NOTE — ED Notes (Signed)
Patient transported to CT 

## 2012-11-14 NOTE — ED Notes (Signed)
Has hematoma and 2 inch laceration to top of head. Takes Plavix. MD sutured.

## 2012-11-14 NOTE — ED Notes (Signed)
Per EMS- Pt tripped and fell in parking lot, has hematoma to back of head. Pt does take blood thinners, unsure of which ones.  No LOC. BP 130/94, HR 64.

## 2012-11-14 NOTE — ED Provider Notes (Signed)
CSN: [id number]     Arrival date & time 11/14/12  1630 History   First MD Initiated Contact with Patient 11/14/12 1634     No chief complaint on file.  (Consider location/radiation/quality/duration/timing/severity/associated sxs/prior Treatment) Patient is a 67 y.o. male presenting with fall. The history is provided by the patient. No language interpreter was used.  Fall This is a new problem. The current episode started today. The problem occurs rarely. The problem has been unchanged. Pertinent negatives include no abdominal pain, chest pain, congestion, coughing, fever, headaches, nausea, numbness, rash, sore throat, urinary symptoms or vomiting. Exacerbated by: palpation. Treatments tried: direct pressure. The treatment provided mild relief.    Past Medical History  Diagnosis Date  . Brain tumor     glioma  . Cancer     PROSTATE CANCER  . TIA (transient ischemic attack)     APPROX N2 MONTHS AGO 01/2011  . Sleep apnea   . IBS (irritable bowel syndrome)   . Shingles   . Cataract   . Seizures   . CVA (cerebral infarction)     R thalamic  . Stroke    Past Surgical History  Procedure Laterality Date  . Eye surgery      CATARACT REMOVAL  . Joint replacement      BILATERAL KNEE REPLACEMENT  . Prostatectomy    . Palate surgery    . Shoulder arthroscopy     Family History  Problem Relation Age of Onset  . Hypertension Father   . Transient ischemic attack Mother    History  Substance Use Topics  . Smoking status: Former Games developer  . Smokeless tobacco: Not on file  . Alcohol Use: 3.0 oz/week    5 Glasses of wine per week    Review of Systems  Constitutional: Negative for fever.  HENT: Negative for congestion, sore throat and rhinorrhea.   Respiratory: Negative for cough and shortness of breath.   Cardiovascular: Negative for chest pain.  Gastrointestinal: Negative for nausea, vomiting, abdominal pain and diarrhea.  Genitourinary: Negative for dysuria and hematuria.   Skin: Negative for rash.  Neurological: Negative for syncope, light-headedness, numbness and headaches.  All other systems reviewed and are negative.    Allergies  Amoxicillin; Dilaudid; Lactose intolerance (gi); and Sulfa antibiotics  Home Medications   Current Outpatient Rx  Name  Route  Sig  Dispense  Refill  . atorvastatin (LIPITOR) 40 MG tablet   Oral   Take 40 mg by mouth daily.         . benzonatate (TESSALON) 200 MG capsule   Oral   Take 200 mg by mouth 2 (two) times daily with a meal.         . calcium-vitamin D (OSCAL WITH D) 500-200 MG-UNIT per tablet   Oral   Take 1 tablet by mouth daily.         . clopidogrel (PLAVIX) 75 MG tablet   Oral   Take 75 mg by mouth daily.         Marland Kitchen donepezil (ARICEPT) 5 MG tablet      TAKE 1 TABLET AT BEDTIME   90 tablet   1   . fesoterodine (TOVIAZ) 8 MG TB24   Oral   Take 8 mg by mouth daily.         Marland Kitchen levETIRAcetam (KEPPRA) 500 MG tablet      TAKE 1 TABLET BY MOUTH EVERY MORNING AND 2 TABLETS AT NIGHT   90 tablet   6   .  PHENobarbital (LUMINAL) 97.2 MG tablet   Oral   Take 145.8 mg by mouth at bedtime.         Marland Kitchen UNABLE TO FIND      OUTPATIENT PHYSICAL THERAPY OUTPATIENT OCCUPATIONAL THERAPY   Diagnosis: Acute CVA, seizures, gait instability   1 Mutually Defined   1    There were no vitals taken for this visit. Physical Exam  Nursing note and vitals reviewed. Constitutional: He is oriented to person, place, and time. He appears well-developed and well-nourished.  HENT:  Head: Normocephalic.  Right Ear: External ear normal.  Left Ear: External ear normal.  Small 1cm laceration to occipital region with some overlying abrasions and tissue maceration  Eyes: EOM are normal. Pupils are equal, round, and reactive to light.  Neck: Normal range of motion. Neck supple.  Cardiovascular: Normal rate, regular rhythm, normal heart sounds and intact distal pulses.  Exam reveals no gallop and no friction  rub.   No murmur heard. Pulmonary/Chest: Effort normal and breath sounds normal. No respiratory distress. He has no wheezes. He has no rales. He exhibits no tenderness.  Abdominal: Soft. Bowel sounds are normal. He exhibits no distension. There is no tenderness. There is no rebound.  Musculoskeletal: Normal range of motion. He exhibits no edema and no tenderness.  Lymphadenopathy:    He has no cervical adenopathy.  Neurological: He is alert and oriented to person, place, and time.  Neurologic exam: Fundoscopic exam: no papilledema, no hemorrhages, CN I-XII: grossly intact, Sensation: normal in upper and lower extremities, Strength 5/5 in both upper and lower extremities, Coordination intact. Gait normal.  Skin: Skin is warm. No rash noted.  Psychiatric: He has a normal mood and affect. His behavior is normal.    ED Course  LACERATION REPAIR Date/Time: 11/14/2012 5:59 PM Performed by: Fredirick Lathe Authorized by: Fredirick Lathe Consent: Verbal consent obtained. Risks and benefits: risks, benefits and alternatives were discussed Consent given by: patient Required items: required blood products, implants, devices, and special equipment available Patient identity confirmed: arm band, verbally with patient and hospital-assigned identification number Body area: head/neck Location details: scalp Laceration length: 4 cm Foreign bodies: no foreign bodies Tendon involvement: none Nerve involvement: none Vascular damage: no Anesthesia: local infiltration Local anesthetic: lidocaine 2% with epinephrine Anesthetic total: 4 ml Patient sedated: no Preparation: Patient was prepped and draped in the usual sterile fashion. Irrigation solution: saline Irrigation method: jet lavage Amount of cleaning: standard Debridement: none Degree of undermining: none Skin closure: 5-0 Prolene Number of sutures: 4 Technique: simple Approximation: close Approximation difficulty: simple Dressing:  pressure dressing and antibiotic ointment Patient tolerance: Patient tolerated the procedure well with no immediate complications.   (including critical care time) Labs Review Labs Reviewed - No data to display Imaging Review Ct Head Wo Contrast  11/14/2012   CLINICAL DATA:  Fall, hematoma to back of head. On blood thinners.  EXAM: CT HEAD WITHOUT CONTRAST  CT CERVICAL SPINE WITHOUT CONTRAST  TECHNIQUE: Multidetector CT imaging of the head and cervical spine was performed following the standard protocol without intravenous contrast. Multiplanar CT image reconstructions of the cervical spine were also generated.  COMPARISON:  06/07/2012  FINDINGS: CT HEAD FINDINGS  Advanced chronic small vessel disease throughout the deep white matter and atrophy. Calcifications in the right frontotemporal region, stable. No acute intracranial abnormality. Specifically, no hemorrhage, hydrocephalus, mass lesion, acute infarction, or significant intracranial injury. No acute calvarial abnormality. Posterior soft tissue swelling. Visualized paranasal sinuses and mastoids clear.  Orbital soft tissues unremarkable.  CT CERVICAL SPINE FINDINGS  Normal alignment. Prevertebral soft tissues are normal. No fracture. No epidural or paraspinal hematoma. Mild degenerative disc disease at C5-6. Mild bilateral degenerative facet disease, right greater than left.  IMPRESSION: CT HEAD IMPRESSION  No acute findings.  CT CERVICAL SPINE IMPRESSION  No acute bony abnormality.   Electronically Signed   By: Charlett Nose M.D.   On: 11/14/2012 18:14   Ct Cervical Spine Wo Contrast  11/14/2012   CLINICAL DATA:  Fall, hematoma to back of head. On blood thinners.  EXAM: CT HEAD WITHOUT CONTRAST  CT CERVICAL SPINE WITHOUT CONTRAST  TECHNIQUE: Multidetector CT imaging of the head and cervical spine was performed following the standard protocol without intravenous contrast. Multiplanar CT image reconstructions of the cervical spine were also generated.   COMPARISON:  06/07/2012  FINDINGS: CT HEAD FINDINGS  Advanced chronic small vessel disease throughout the deep white matter and atrophy. Calcifications in the right frontotemporal region, stable. No acute intracranial abnormality. Specifically, no hemorrhage, hydrocephalus, mass lesion, acute infarction, or significant intracranial injury. No acute calvarial abnormality. Posterior soft tissue swelling. Visualized paranasal sinuses and mastoids clear. Orbital soft tissues unremarkable.  CT CERVICAL SPINE FINDINGS  Normal alignment. Prevertebral soft tissues are normal. No fracture. No epidural or paraspinal hematoma. Mild degenerative disc disease at C5-6. Mild bilateral degenerative facet disease, right greater than left.  IMPRESSION: CT HEAD IMPRESSION  No acute findings.  CT CERVICAL SPINE IMPRESSION  No acute bony abnormality.   Electronically Signed   By: Charlett Nose M.D.   On: 11/14/2012 18:14    MDM   1. Fall, initial encounter   2. Head contusion, initial encounter   3. Laceration of head, initial encounter    4:34 PM Pt is a 67 y.o. male with pertinent PMHX of TIA, CVA, IBS who presents to the ED with fall. Pt sustained mechanical fall today prior to arrival. Pt fell onto concrete at a parking lot sustaining a hematoma/abrasiona and small laceration to occipital region of head. Pt on plavix. Denies LOC or amnesia to event. No other injuries or pain. Denies recent illness, fevers. No lightheadedness or syncope prior to fall. Pt stated he is s/p bilateral knee replacement and is unsteady on his feet at times. Tetanus UTD  On exam: AFVSS, exam as above, neuro exam normal. No tenderness to chest or abdomen. Plan for CT head wo contrast, Ct cervical spine wo contrast given pt has had brain tumor and on plavix.  CT head negative for acute intracranial abnormality. Ct cervical spine negative for acute fracture or dislocation. Plan for discharge with follow up in 5-7 days for suture removal.  6:28  PM: I have discussed the diagnosis/risks/treatment options with the patient and family and believe the pt to be eligible for discharge home to follow-up with PCP in 5-7 days to remove sutures. We also discussed returning to the ED immediately if new or worsening sx occur. We discussed the sx which are most concerning (e.g., stroke like symptoms, increased ICP, unable to wake up) that necessitate immediate return. Any new prescriptions provided to the patient are listed below.   New Prescriptions   No medications on file    The patient appears reasonably screened and/or stabilized for discharge and I doubt any other medical condition or other [facility] requiring further screening, evaluation or treatment in the ED at this time prior to discharge . Pt in agreement with discharge plan. Return precautions given. Pt discharged VSS  Imaging reviewed by myself and considered in medical decision making if ordered.  Imaging interpreted by radiology. Pt was discussed with my attending, Dr. Haynes Dage, MD 11/14/12 [phone]

## 2012-11-14 NOTE — ED Notes (Signed)
Quick Clot applied and bleeding as ceased .

## 2012-11-15 NOTE — ED Provider Notes (Signed)
I saw and evaluated the patient, reviewed the resident's note and I agree with the findings and plan. S/p mechanical fall earlier this evening. Contusion/lac to scalp. c spine nt. Ct. Resident sutured wound.   Suzi Roots, MD 11/15/12 1520

## 2012-12-01 ENCOUNTER — Ambulatory Visit (INDEPENDENT_AMBULATORY_CARE_PROVIDER_SITE_OTHER): Payer: Medicare Other | Admitting: Family Medicine

## 2012-12-01 VITALS — BP 110/70 | HR 78 | Temp 97.3°F | Resp 18

## 2012-12-01 DIAGNOSIS — S0101XA Laceration without foreign body of scalp, initial encounter: Secondary | ICD-10-CM

## 2012-12-01 DIAGNOSIS — S0181XA Laceration without foreign body of other part of head, initial encounter: Secondary | ICD-10-CM

## 2012-12-01 DIAGNOSIS — S0100XA Unspecified open wound of scalp, initial encounter: Secondary | ICD-10-CM

## 2012-12-01 DIAGNOSIS — S0990XA Unspecified injury of head, initial encounter: Secondary | ICD-10-CM

## 2012-12-01 DIAGNOSIS — S0180XA Unspecified open wound of other part of head, initial encounter: Secondary | ICD-10-CM

## 2012-12-01 NOTE — Progress Notes (Signed)
VCO. Local anesthesia with 2% lidocaine with epinephrine. Prepped with betadine. #3 SI sutures placed in most inferior wound on forehead. 2 lacerations superior were closed by Dr. Milus Glazier.  Patient tolerated well. Cleaned and bandaged.

## 2012-12-01 NOTE — Progress Notes (Signed)
This unfortunate 67 year old man had a brain tumor in 1982 and then had 2 different cerebrovascular accidents which are being treated with Plavix. He was in his usual state of health today when he lost his balance and fell striking his forehead and scalp against a rocking chair. There was no loss of consciousness patient denies any headache at the present time.   He is accompanied by his wife today who states that the patient has residual organic brain syndrome, seizure disorder, chronic dizziness, and left-sided weakness. He was seen in the emergency room 2 weeks ago for a fall which resulted in a laceration to his occipital area.  Tetanus up to date  Patient denies neck pain, nausea, vomiting, or any loss of consciousness.  Objective: Patient is alert and cooperative following two-step commands without problem. His extraocular motion is normal. Moving 4 extremities equally. Patient has 3 separate 1-2 cm lacerations.  2 of the lacerations or frontal scalp and are actively bleeding. Patient was brought back urgently and the subcutaneous bleeders were controlled with horizontal mattress stitches x2 each. (Lidocaine with epinephrine was used for local anesthesia)  Patient also had a 1-2 cm laceration on the frontal area of his forehead. This was also anesthetized with lidocaine and epi, cleansed with Betadine, and then repaired with 5-0 Ethilon. No battles' sign Normal fundi Teeth intact, no oral lesions Neck with FROM, nontender Neuro:  Alert and cooperative CN III-XII intact Motor:  Good strength all 4 extremities Cerebellar:  Positive Romberg  Assessment:  Ataxia is intermittent.  Wounds are dry and bandaged at discharge and patient is comfortable and exhibiting pre-morbid neuro exam.  Plan:  Follow up with Dr. Andrey Campanile for ataxia. Sutures out in 8 days.  Signed, Elvina Sidle, MD

## 2012-12-09 ENCOUNTER — Ambulatory Visit (INDEPENDENT_AMBULATORY_CARE_PROVIDER_SITE_OTHER): Payer: Medicare Other | Admitting: Physician Assistant

## 2012-12-09 VITALS — BP 96/60 | HR 64 | Temp 97.9°F | Resp 18 | Ht 69.5 in | Wt 177.0 lb

## 2012-12-09 DIAGNOSIS — Z4802 Encounter for removal of sutures: Secondary | ICD-10-CM

## 2012-12-09 NOTE — Progress Notes (Signed)
  Subjective:    Patient ID: Rodney Potts, male    DOB: 1945-08-22, 67 y.o.   MRN: [id number]  Suture / Staple Removal     Rodney Potts is a pleasant 67 yr old male here for removal of sutures placed here 12/01/12.  He is doing well.  Lacerations are scabbed but with no signs of infection.  Review of Systems  Skin: Positive for wound.  All other systems reviewed and are negative.       Objective:   Physical Exam  Vitals reviewed. Constitutional: He is oriented to person, place, and time. He appears well-developed and well-nourished. No distress.  HENT:  Head: Normocephalic and atraumatic.    Three healing lacerations at forehead; #7 sutures gently removed from surrounding scab; edges well approximated  Eyes: Conjunctivae are normal. No scleral icterus.  Pulmonary/Chest: Effort normal.  Neurological: He is alert and oriented to person, place, and time.  Skin: Skin is warm and dry.  Psychiatric: He has a normal mood and affect. His behavior is normal.        Assessment & Plan:  Visit for suture removal   Rodney Potts is a pleasant 67 yr old male here for suture removal.  Sutures removed without difficulty.  Lacerations appear to be healing well.  No need for further follow up unless concerns arise

## 2013-01-08 ENCOUNTER — Telehealth: Payer: Self-pay | Admitting: *Deleted

## 2013-01-08 NOTE — Telephone Encounter (Signed)
Called patient wife to see if i can get the patient in today for a check with Dr. Pearlean Brownie, states  He having increased confusion,and cant walk. Called both her numbers no one answered lvm asking her to call me back.

## 2013-01-09 NOTE — Telephone Encounter (Signed)
I spoke with the patient wife she will be bringing him in to see lynn, to address his problems.

## 2013-01-12 ENCOUNTER — Encounter (INDEPENDENT_AMBULATORY_CARE_PROVIDER_SITE_OTHER): Payer: Self-pay

## 2013-01-12 ENCOUNTER — Other Ambulatory Visit: Payer: Self-pay | Admitting: Nurse Practitioner

## 2013-01-12 ENCOUNTER — Ambulatory Visit (INDEPENDENT_AMBULATORY_CARE_PROVIDER_SITE_OTHER): Payer: Medicare Other | Admitting: Nurse Practitioner

## 2013-01-12 ENCOUNTER — Encounter: Payer: Self-pay | Admitting: Nurse Practitioner

## 2013-01-12 ENCOUNTER — Ambulatory Visit
Admission: RE | Admit: 2013-01-12 | Discharge: 2013-01-12 | Disposition: A | Discharge status: Home / Self Care | Payer: Medicare Other | Source: Ambulatory Visit | Attending: Nurse Practitioner | Admitting: Nurse Practitioner

## 2013-01-12 VITALS — BP 97/71 | HR 68 | Temp 97.1°F | Ht 69.0 in | Wt 180.0 lb

## 2013-01-12 DIAGNOSIS — R5381 Other malaise: Secondary | ICD-10-CM

## 2013-01-12 DIAGNOSIS — R531 Weakness: Secondary | ICD-10-CM

## 2013-01-12 DIAGNOSIS — R269 Unspecified abnormalities of gait and mobility: Secondary | ICD-10-CM

## 2013-01-12 DIAGNOSIS — R279 Unspecified lack of coordination: Secondary | ICD-10-CM

## 2013-01-12 DIAGNOSIS — R413 Other amnesia: Secondary | ICD-10-CM

## 2013-01-12 DIAGNOSIS — Z85841 Personal history of malignant neoplasm of brain: Secondary | ICD-10-CM

## 2013-01-12 DIAGNOSIS — Z87898 Personal history of other specified conditions: Secondary | ICD-10-CM

## 2013-01-12 DIAGNOSIS — R270 Ataxia, unspecified: Secondary | ICD-10-CM

## 2013-01-12 DIAGNOSIS — R569 Unspecified convulsions: Secondary | ICD-10-CM

## 2013-01-12 DIAGNOSIS — Z9181 History of falling: Secondary | ICD-10-CM

## 2013-01-12 DIAGNOSIS — R296 Repeated falls: Secondary | ICD-10-CM

## 2013-01-12 MED ORDER — DONEPEZIL HCL 5 MG PO TABS: 5.0000 mg | ORAL_TABLET | Freq: Every day | ORAL | Status: DC

## 2013-01-12 MED ORDER — PHENOBARBITAL 97.2 MG PO TABS: 97.2000 mg | ORAL_TABLET | Freq: Every day | ORAL | Status: DC

## 2013-01-12 NOTE — Progress Notes (Signed)
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 History of TIA 09/21/11  He returns for followup last visit on 09/28/11. He continues to have gait and balance problems and has had several falls particularly last 1 month. He has also had some progressive memory loss and cognitive worsening as per his wife. He was admitted to [facility] earlier this month on 06/07/12 following a fall and MRI scan of the brain showed a left internal capsule small acute infarct felt to be due to small vessel disease. He was advised to continue Plavix and Lipitor. He states he has been participating in outpatient physical therapy and feels his balance is slightly improved. He has not had any breakthrough seizures now for several years. He has not tried any medications to help improve his memory.   UPDATE 10/01/12 (LL): Patient returns for 2 month revisit, last stroke on 06/07/12. On Plavix for secondary stroke prevention, patient denies medication side effects, with no signs of extensive bruising. On Keppra and Phenobarbital for seizure prophylaxis, has not had a seizure in many years. Phenobarbital dose was cut in half at last hospitalization. Patient has completed outpatient therapy. Started Aricept 5 mg at last visit, tolerating well. Had bad fall at home earlier this week, tripped and fell forward. Eyeglasses lacerated his left upper brow, went to urgent care and received stitches and head CT. Wife c/o of excessive sleepiness. MMSE last visit 24, today is 28.   UPDATE 01/12/13 (LL):  Wife calls for acute visit, patient is doing worse, falling more frequently, and is more confused.  He had fall on 11/14/12 in which  he had laceration to his occipital area, went to the ER, CT was negative.  Larey Seat again on 12/01/12 and suffered 2 lacerations to the forehead which needed sutures, no CT head. She states he is progressively more unsteady on his feet, progressively weaker, falls frequently, and sleeps often.  He is having progressively more difficulty with his ADL's.  Denies nausea, vomiting, headache, seizure activity, blacking out, dysuria, or any change in bowel or bladder function.  Wife has concerns over how to know when she needs to take him to the ER or keep him home, she is trying to work 6 more months and then they plan to retire to [place] in a 1-level home.  He is functionally no different than when he was in the office in July.  REVIEW OF SYSTEMS: Full 14 system review of systems performed and notable only for:  constitutional: fatigue  genitourinary: urination problems, incontinence  Gastrointestinal: Incontinence Hematology: easy bruising, easy bleeding  neurological: memory loss, confusion sleep: sleepiness  ALLERGIES: Allergies  Allergen Reactions  . Amoxicillin Diarrhea  . Dilaudid [Hydromorphone Hcl] Other (See Comments)    DELUSIONS  . Lactose Intolerance (Gi) Diarrhea  . Sulfa Antibiotics Other (See Comments)    Cant control bladder    HOME MEDICATIONS: Outpatient Prescriptions Prior to Visit  Medication Sig Dispense Refill  . atorvastatin (LIPITOR) 40 MG tablet Take 40 mg by mouth at bedtime.       . clopidogrel (PLAVIX) 75 MG tablet Take 75 mg by mouth at bedtime.       . fesoterodine (TOVIAZ) 8 MG TB24 Take  8 mg by mouth daily.      . fluticasone (FLONASE) 50 MCG/ACT nasal spray Place 2 sprays into the nose daily.      Marland Kitchen levETIRAcetam (KEPPRA) 500 MG tablet Take 500-1,000 mg by mouth every 12 (twelve) hours. Take 500mg  in the am & 1000mg  in the pm      . UNABLE TO FIND OUTPATIENT PHYSICAL THERAPY OUTPATIENT OCCUPATIONAL THERAPY   Diagnosis: Acute CVA, seizures, gait instability   1 Mutually Defined  1  . donepezil (ARICEPT) 5 MG tablet Take 5 mg by mouth at bedtime.      Marland Kitchen PHENobarbital (LUMINAL) 97.2 MG tablet Take 145.8 mg by mouth at bedtime. 1.5 tab at night       No facility-administered medications prior to visit.    PAST MEDICAL HISTORY: Past Medical History  Diagnosis Date  . Brain tumor     glioma  . Cancer     PROSTATE CANCER  . TIA (transient ischemic attack)     APPROX N2 MONTHS AGO 01/2011  . Sleep apnea   . IBS (irritable bowel syndrome)   . Shingles   . Cataract   . Seizures   . CVA (cerebral infarction)     R thalamic  . Stroke   . Allergy     PAST SURGICAL HISTORY: Past Surgical History  Procedure Laterality Date  . Eye surgery      CATARACT REMOVAL  . Joint replacement      BILATERAL KNEE REPLACEMENT  . Prostatectomy    . Palate surgery    . Shoulder arthroscopy      FAMILY HISTORY: Family History  Problem Relation Age of Onset  . Hypertension Father   . Transient ischemic attack Mother   . Cancer Mother     SOCIAL HISTORY: History   Social History  . Marital Status: Married    Spouse Name: N/A    Number of Children: 3  . Years of Education: MA   Occupational History  . retired    Social History Main Topics  . Smoking status: Former Games developer  . Smokeless tobacco: Not on file  . Alcohol Use: 3.0 oz/week    5 Glasses of wine per week  . Drug Use: No  . Sexual Activity: No   Other Topics Concern  . Not on file   Social History Narrative  . No narrative on file     PHYSICAL EXAM  Filed Vitals:   01/12/13 0931  BP: 97/71  Pulse: 68  Temp: 97.1 F (36.2 C)  TempSrc: Oral  Height: 5\' 9"  (1.753 m)  Weight: 180 lb (81.647 kg)   Body mass index is 26.57 kg/(m^2).  Generalized: In no acute distress  Neck: Supple, no carotid bruits  Cardiac: Regular rate rhythm, no murmur  Pulmonary: Clear to auscultation bilaterally  Musculoskeletal: Stooped posture  Neurological examination  Mentation:  Alert oriented to time, place, history taking, language fluent, and casual conversation.  MMSE 23/30, AFT 10, Clock Drawing 2/4 GSD 6. (Last visit 10/01/12: MMSE 28/30, AFT 17, Clock drawing 3/4.) (Last visit 24/30, AFT 7, Clock drawing 1/4). Geriatric Depression scale 4 (does not suggest depression).  Cranial nerve II-XII: Pupils were equal round reactive to light extraocular movements were full, visual field were full on confrontational test. facial sensation and strength were normal. hearing was intact to finger rubbing bilaterally. Uvula tongue midline. head turning and shoulder shrug and were normal and symmetric.Tongue protrusion into cheek strength was normal.  MOTOR: normal bulk and  tone, full strength in the BUE, BLE, fine finger movements decreased on the left,Orbits right over left upper extremity. Tone is slightly increased in both legs, L>R. no pronator drift  SENSORY: normal and symmetric to light touch  COORDINATION: finger-nose-finger, heel-to-shin normal bilaterally REFLEXES: Brachioradialis 2/2, biceps 2/2, triceps 2/2, patellar 2/2, Achilles 2/2.  GAIT/STATION: Rising up from seated position wild mild difficulty, stooped stance, moderate stride, mild imbalance, unable to perform tiptoe, not able to heel walk or tandem without difficulty. Romberg positive.  DIAGNOSTIC DATA (LABS, IMAGING, TESTING) - I reviewed patient records, labs, notes, testing and imaging myself where available.  Lab Results  Component Value Date   WBC 7.2 06/07/2012   HGB 14.3 06/07/2012   HCT 42.1 06/07/2012   MCV 93.6 06/07/2012   PLT 202 06/07/2012      Component Value Date/Time   NA 140 06/07/2012 1920   K 4.4 06/07/2012 1920   CL 102 06/07/2012 1920   CO2 28 06/07/2012 1920   GLUCOSE 81 06/07/2012 1920   BUN 32* 06/07/2012 1920   CREATININE 1.18 06/07/2012 1920   CALCIUM 9.7 06/07/2012 1920   PROT 7.2 06/07/2012 1920   ALBUMIN 4.3 06/07/2012 1920   AST 24 06/07/2012 1920   ALT 16 06/07/2012 1920   ALKPHOS 56 06/07/2012 1920    BILITOT 0.2* 06/07/2012 1920   GFRNONAA 63* 06/07/2012 1920   GFRAA 72* 06/07/2012 1920   Lab Results  Component Value Date   CHOL 149 06/08/2012   HDL 57 06/08/2012   LDLCALC 79 06/08/2012   TRIG 64 06/08/2012   CHOLHDL 2.6 06/08/2012   Lab Results  Component Value Date   HGBA1C 5.5 06/08/2012   Lab Results  Component Value Date   VITAMINB12 439 06/08/2012   Lab Results  Component Value Date   TSH 3.661 06/08/2012    MRI HEAD Wo CONTRAST 06/08/12  Tiny acute non hemorrhagic infarct posterior left lenticular nucleus. Remote right thalamic infarct.  Right frontal parenchymal changes and calcification may represent result of treated tumor and without change.  MRA HEAD 06/08/12  Intracranial atherosclerotic type changes as noted above most notable involving the posterior cerebral arteries.  CAROTID DOPPLER 06/09/12  Bilateral: mild soft plaque origin ICA. No signficant ICA stenosis. Vertebral artery flow is antegrade. CT HEAD IMPRESSION 11/14/12 No acute findings.  CT CERVICAL SPINE IMPRESSION 11/14/12 No acute bony abnormality.    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 He is functionally no different than when he was in the office in July, I expect his wife is going through caregiver role strain.  I discussed his prognosis and what realistic expectations are for his activities.  Continue clopidogrel 75 mg orally every day for secondary stroke prevention.  Continue Keppra.  Reduce phenobarbital to 1 tablet daily. We will check MRI for any acute abnormality. Order PT for gait and balance training, frequent falls. Continue  Aricept 5 mg daily for memory loss. Rx sent. Return for followup in 1 months with Dr. Pearlean Brownie.  May try to decrease Phenobarbital further at that time.  Orders Placed This Encounter  Procedures  . MR Brain Wo Contrast  . Ambulatory referral to Physical  Therapy    Meds ordered this encounter  Medications  . donepezil (ARICEPT) 5 MG tablet    Sig: Take 1 tablet (5 mg total) by mouth at bedtime.    Dispense:  90 tablet    Refill:  3    Order Specific Question:  Supervising Provider    Answer:  Joycelyn Potts R [[id number]]  . PHENobarbital (LUMINAL) 97.2 MG tablet    Sig: Take 1 tablet (97.2 mg total) by mouth at bedtime.    Dispense:  90 tablet    Refill:  3    Order Specific Question:  Supervising Provider    Answer:  Joycelyn Potts R [[id number]]   Tawny Asal [place] Gavitt, MSN, NP-C 01/12/2013, 10:38 AM [facility] [address] [place], [place] [address] [phone]  Note: This document was prepared with digital dictation and possible smart phrase technology. Any transcriptional errors that result from this process are unintentional.

## 2013-01-12 NOTE — Patient Instructions (Addendum)
Continue clopidogrel 75 mg orally every day for secondary stroke prevention and maintain strict control of hypertension with blood pressure goal below 130/90, diabetes with hemoglobin A1c goal below 6.5% and lipids with LDL cholesterol goal below 100 mg/dL.  Continue Keppra.  Reduce phenobarbital to 1 tablet daily. We will check MRI for any acute abnormality. Continue Aricept 5 mg daily for memory loss. Rx sent. Return for followup in 1 months with Dr. Pearlean Brownie.

## 2013-01-14 ENCOUNTER — Encounter (HOSPITAL_COMMUNITY): Payer: Self-pay | Admitting: Emergency Medicine

## 2013-01-14 ENCOUNTER — Telehealth: Payer: Self-pay | Admitting: Neurology

## 2013-01-14 ENCOUNTER — Emergency Department (HOSPITAL_COMMUNITY): Payer: Medicare Other

## 2013-01-14 ENCOUNTER — Inpatient Hospital Stay (HOSPITAL_COMMUNITY)
Admission: EM | Admit: 2013-01-14 | Discharge: 2013-01-20 | DRG: 557 | Disposition: A | Discharge status: Rehabilitation Facility | Payer: Medicare Other | Attending: Family Medicine | Admitting: Family Medicine

## 2013-01-14 DIAGNOSIS — I6350 Cerebral infarction due to unspecified occlusion or stenosis of unspecified cerebral artery: Secondary | ICD-10-CM

## 2013-01-14 DIAGNOSIS — R4789 Other speech disturbances: Secondary | ICD-10-CM | POA: Diagnosis present

## 2013-01-14 DIAGNOSIS — R5381 Other malaise: Secondary | ICD-10-CM | POA: Diagnosis present

## 2013-01-14 DIAGNOSIS — Z8546 Personal history of malignant neoplasm of prostate: Secondary | ICD-10-CM

## 2013-01-14 DIAGNOSIS — K589 Irritable bowel syndrome without diarrhea: Secondary | ICD-10-CM | POA: Diagnosis present

## 2013-01-14 DIAGNOSIS — Z923 Personal history of irradiation: Secondary | ICD-10-CM

## 2013-01-14 DIAGNOSIS — Z79899 Other long term (current) drug therapy: Secondary | ICD-10-CM

## 2013-01-14 DIAGNOSIS — R4701 Aphasia: Secondary | ICD-10-CM

## 2013-01-14 DIAGNOSIS — Z8673 Personal history of transient ischemic attack (TIA), and cerebral infarction without residual deficits: Secondary | ICD-10-CM

## 2013-01-14 DIAGNOSIS — W108XXA Fall (on) (from) other stairs and steps, initial encounter: Secondary | ICD-10-CM | POA: Diagnosis present

## 2013-01-14 DIAGNOSIS — Z87891 Personal history of nicotine dependence: Secondary | ICD-10-CM

## 2013-01-14 DIAGNOSIS — R270 Ataxia, unspecified: Secondary | ICD-10-CM

## 2013-01-14 DIAGNOSIS — C719 Malignant neoplasm of brain, unspecified: Secondary | ICD-10-CM

## 2013-01-14 DIAGNOSIS — Z8249 Family history of ischemic heart disease and other diseases of the circulatory system: Secondary | ICD-10-CM

## 2013-01-14 DIAGNOSIS — I639 Cerebral infarction, unspecified: Secondary | ICD-10-CM | POA: Diagnosis present

## 2013-01-14 DIAGNOSIS — G4733 Obstructive sleep apnea (adult) (pediatric): Secondary | ICD-10-CM | POA: Diagnosis present

## 2013-01-14 DIAGNOSIS — Z96659 Presence of unspecified artificial knee joint: Secondary | ICD-10-CM

## 2013-01-14 DIAGNOSIS — R269 Unspecified abnormalities of gait and mobility: Secondary | ICD-10-CM

## 2013-01-14 DIAGNOSIS — I739 Peripheral vascular disease, unspecified: Secondary | ICD-10-CM | POA: Diagnosis present

## 2013-01-14 DIAGNOSIS — F0390 Unspecified dementia without behavioral disturbance: Secondary | ICD-10-CM | POA: Diagnosis present

## 2013-01-14 DIAGNOSIS — Z9181 History of falling: Secondary | ICD-10-CM

## 2013-01-14 DIAGNOSIS — G459 Transient cerebral ischemic attack, unspecified: Secondary | ICD-10-CM

## 2013-01-14 DIAGNOSIS — R4781 Slurred speech: Secondary | ICD-10-CM

## 2013-01-14 DIAGNOSIS — R262 Difficulty in walking, not elsewhere classified: Secondary | ICD-10-CM | POA: Diagnosis present

## 2013-01-14 DIAGNOSIS — Z85841 Personal history of malignant neoplasm of brain: Secondary | ICD-10-CM

## 2013-01-14 DIAGNOSIS — M6282 Rhabdomyolysis: Principal | ICD-10-CM | POA: Diagnosis present

## 2013-01-14 DIAGNOSIS — R21 Rash and other nonspecific skin eruption: Secondary | ICD-10-CM | POA: Diagnosis present

## 2013-01-14 DIAGNOSIS — G9389 Other specified disorders of brain: Secondary | ICD-10-CM | POA: Diagnosis present

## 2013-01-14 DIAGNOSIS — R531 Weakness: Secondary | ICD-10-CM

## 2013-01-14 DIAGNOSIS — Z9221 Personal history of antineoplastic chemotherapy: Secondary | ICD-10-CM

## 2013-01-14 DIAGNOSIS — R569 Unspecified convulsions: Secondary | ICD-10-CM | POA: Diagnosis present

## 2013-01-14 DIAGNOSIS — L299 Pruritus, unspecified: Secondary | ICD-10-CM | POA: Diagnosis present

## 2013-01-14 DIAGNOSIS — E7800 Pure hypercholesterolemia, unspecified: Secondary | ICD-10-CM | POA: Diagnosis present

## 2013-01-14 HISTORY — DX: Malignant neoplasm of prostate: C61

## 2013-01-14 HISTORY — DX: Other complications of anesthesia, initial encounter: T88.59XA

## 2013-01-14 HISTORY — DX: Pure hypercholesterolemia, unspecified: E78.00

## 2013-01-14 HISTORY — DX: Adverse effect of unspecified anesthetic, initial encounter: T41.45XA

## 2013-01-14 LAB — COMPREHENSIVE METABOLIC PANEL
ALT: 25 U/L (ref 0–53)
Albumin: 4.7 g/dL (ref 3.5–5.2)
Alkaline Phosphatase: 71 U/L (ref 39–117)
BUN: 17 mg/dL (ref 6–23)
Chloride: 96 mEq/L (ref 96–112)
GFR calc Af Amer: >90 mL/min (ref >90)
GFR calc non Af Amer: 87 mL/min — ABNORMAL LOW (ref >90)
Glucose, Bld: 83 mg/dL (ref 70–99)
Potassium: 4.8 mEq/L (ref 3.5–5.1)
Sodium: 135 mEq/L (ref 135–145)
Total Bilirubin: 0.3 mg/dL (ref 0.3–1.2)

## 2013-01-14 LAB — PHENOBARBITAL LEVEL: Phenobarbital: 24.6 ug/mL (ref 15.0–40.0)

## 2013-01-14 LAB — TROPONIN I: Troponin I: <0.3 ng/mL (ref <0.30)

## 2013-01-14 MED ORDER — INFLUENZA VAC SPLIT QUAD 0.5 ML IM SUSP
0.5000 mL | INTRAMUSCULAR | Status: DC
  Filled 2013-01-14: qty 0.5

## 2013-01-14 MED ORDER — SODIUM CHLORIDE 0.9 % IV BOLUS (SEPSIS)
1000.0000 mL | INTRAVENOUS | Status: AC
  Administered 2013-01-14: 1000 mL via INTRAVENOUS

## 2013-01-14 MED ORDER — ACETAMINOPHEN 325 MG PO TABS
650.0000 mg | ORAL_TABLET | Freq: Once | ORAL | Status: AC
  Administered 2013-01-14: 650 mg via ORAL
  Filled 2013-01-14: qty 2

## 2013-01-14 MED ORDER — PNEUMOCOCCAL VAC POLYVALENT 25 MCG/0.5ML IJ INJ
0.5000 mL | INJECTION | INTRAMUSCULAR | Status: DC
  Filled 2013-01-14: qty 0.5

## 2013-01-14 MED ORDER — FENTANYL CITRATE 0.05 MG/ML IJ SOLN
50.0000 ug | Freq: Once | INTRAMUSCULAR | Status: AC
  Administered 2013-01-14: 50 ug via INTRAVENOUS
  Filled 2013-01-14: qty 2

## 2013-01-14 NOTE — ED Notes (Signed)
Pt returned from radiology.

## 2013-01-14 NOTE — ED Notes (Signed)
Pt is unable to give urine specimen at this time. Pt has been advised to use call light for assistance restroom. The tech has reported to the RN in charge.

## 2013-01-14 NOTE — ED Notes (Signed)
Pt c/o fall that happened this morning, reports he tripped and that is what caused him to fall. Denies LOC/hitting head. Pt c/o pain to abd, back and bilateral sides. Pt has hx of CVA, went to physical therapy and had some improvement but still has some left sided weakness along with memory issues. Pt reports he feels generalized weakness but still weaker on left side like normal. Pt acting like self per wife. Pt in nad, skin warm and dry, resp e/u. Denies feeling dizzy/lightheaded prior to falling.

## 2013-01-14 NOTE — ED Notes (Signed)
Patient transported to CT 

## 2013-01-14 NOTE — ED Notes (Signed)
Main lab called, report the PT/INR tube top was not sealed completely and have to reject the sample.

## 2013-01-14 NOTE — ED Notes (Signed)
Harrison, MD at bedside. 

## 2013-01-14 NOTE — ED Notes (Signed)
Pt called out to say that his mouth is bleeding.

## 2013-01-14 NOTE — ED Notes (Signed)
Wife reports he has been having generalized weakness for about a week now.

## 2013-01-14 NOTE — Telephone Encounter (Signed)
Received call from Dr Romeo Apple in [facility] ED. Mr Kiely was brought in after having fall. Noted to have generalized weakness, per Dr Romeo Apple unable to ambulate safely. Will be admitted and evaluated by neurohospitalists.

## 2013-01-14 NOTE — ED Notes (Signed)
Pt c/o increasing balance problems with legs; pt unable to ambulate without difficulty; pt able to hold legs up but trouble placing back on foot rest; pt family sts progressing x 4 days; pt had MRI on Monday; pt with hx of previous stroke; pt fell down stairs today and was unable to get up; pt c/o back and abd pain; speech per norm per family

## 2013-01-14 NOTE — ED Provider Notes (Signed)
CSN: [id number]     Arrival date & time 01/14/13  1642 History   First MD Initiated Contact with Patient 01/14/13 1737     Chief Complaint  Patient presents with  . Weakness   (Consider location/radiation/quality/duration/timing/severity/associated sxs/prior Treatment) Patient is a 67 y.o. male presenting with weakness. The history is provided by the patient.  Weakness This is a new problem. Episode onset: 1 week ago. The problem occurs constantly. The problem has been gradually worsening. Pertinent negatives include no chest pain, no abdominal pain, no headaches and no shortness of breath. Nothing aggravates the symptoms. Nothing relieves the symptoms. He has tried nothing for the symptoms. The treatment provided no relief.    Past Medical History  Diagnosis Date  . Brain tumor     glioma  . Cancer     PROSTATE CANCER  . TIA (transient ischemic attack)     APPROX N2 MONTHS AGO 01/2011  . Sleep apnea   . IBS (irritable bowel syndrome)   . Shingles   . Cataract   . Seizures   . CVA (cerebral infarction)     R thalamic  . Stroke   . Allergy    Past Surgical History  Procedure Laterality Date  . Eye surgery      CATARACT REMOVAL  . Joint replacement      BILATERAL KNEE REPLACEMENT  . Prostatectomy    . Palate surgery    . Shoulder arthroscopy     Family History  Problem Relation Age of Onset  . Hypertension Father   . Transient ischemic attack Mother   . Cancer Mother    History  Substance Use Topics  . Smoking status: Former Games developer  . Smokeless tobacco: Not on file  . Alcohol Use: 3.0 oz/week    5 Glasses of wine per week    Review of Systems  Constitutional: Negative for fever.  HENT: Negative for drooling and rhinorrhea.   Eyes: Negative for pain.  Respiratory: Negative for cough and shortness of breath.   Cardiovascular: Negative for chest pain and leg swelling.  Gastrointestinal: Negative for nausea, vomiting, abdominal pain and diarrhea.   Genitourinary: Negative for dysuria and hematuria.  Musculoskeletal: Negative for gait problem and neck pain.  Skin: Negative for color change.  Neurological: Positive for weakness. Negative for numbness and headaches.  Hematological: Negative for adenopathy.  Psychiatric/Behavioral: Negative for behavioral problems.  All other systems reviewed and are negative.    Allergies  Amoxicillin; Dilaudid; Lactose intolerance (gi); and Sulfa antibiotics  Home Medications   Current Outpatient Rx  Name  Route  Sig  Dispense  Refill  . atorvastatin (LIPITOR) 40 MG tablet   Oral   Take 40 mg by mouth at bedtime.          . clopidogrel (PLAVIX) 75 MG tablet   Oral   Take 75 mg by mouth at bedtime.          . donepezil (ARICEPT) 5 MG tablet   Oral   Take 1 tablet (5 mg total) by mouth at bedtime.   90 tablet   3   . fesoterodine (TOVIAZ) 8 MG TB24   Oral   Take 8 mg by mouth daily.         . fluticasone (FLONASE) 50 MCG/ACT nasal spray   Nasal   Place 2 sprays into the nose daily.         Marland Kitchen levETIRAcetam (KEPPRA) 500 MG tablet   Oral   Take 500-1,000 mg  by mouth every 12 (twelve) hours. Take 500mg  in the am & 1000mg  in the pm         . PHENobarbital (LUMINAL) 97.2 MG tablet   Oral   Take 1 tablet (97.2 mg total) by mouth at bedtime.   90 tablet   3   . UNABLE TO FIND      OUTPATIENT PHYSICAL THERAPY OUTPATIENT OCCUPATIONAL THERAPY   Diagnosis: Acute CVA, seizures, gait instability   1 Mutually Defined   1    BP 125/81  Pulse 89  Temp(Src) 98.9 F (37.2 C) (Oral)  Resp 16  SpO2 94% Physical Exam  Nursing note and vitals reviewed. Constitutional: He appears well-developed and well-nourished.  HENT:  Head: Normocephalic and atraumatic.  Right Ear: External ear normal.  Left Ear: External ear normal.  Nose: Nose normal.  Mouth/Throat: Oropharynx is clear and moist. No oropharyngeal exudate.  Eyes: Conjunctivae and EOM are normal. Pupils are equal,  round, and reactive to light.  Neck: Normal range of motion. Neck supple.  No focal cervical tenderness to palpation.  Cardiovascular: Normal rate, regular rhythm, normal heart sounds and intact distal pulses.  Exam reveals no gallop and no friction rub.   No murmur heard. Pulmonary/Chest: Effort normal and breath sounds normal. No respiratory distress. He has no wheezes.  Abdominal: Soft. Bowel sounds are normal. He exhibits no distension. There is no tenderness. There is no rebound and no guarding.  Musculoskeletal: Normal range of motion. He exhibits no edema and no tenderness.  Mild midthoracic tenderness to palpation.  Normal range of motion of bilateral hips without pain.  Neurological: He is alert. He has normal strength. No cranial nerve deficit or sensory deficit.  Normal finger to nose bilaterally.  No focal motor or sensory deficit noted.  The patient appears to have truncal ataxia and is unable to get himself out of bed.  Normal understanding. The patient is alert and oriented x2. He believes it is 35.  No obvious peripheral field deficit.  Skin: Skin is warm and dry.  Psychiatric: He has a normal mood and affect. His behavior is normal.    ED Course  Procedures (including critical care time) Labs Review Labs Reviewed  CBC - Abnormal; Notable for the following:    RBC 3.88 (*)    Hemoglobin 11.6 (*)    HCT 35.6 (*)    All other components within normal limits  DIFFERENTIAL - Abnormal; Notable for the following:    Monocytes Relative 17 (*)    Monocytes Absolute 1.1 (*)    All other components within normal limits  COMPREHENSIVE METABOLIC PANEL - Abnormal; Notable for the following:    Total Protein 8.5 (*)    AST 81 (*)    GFR calc non Af Amer 87 (*)    All other components within normal limits  URINALYSIS W MICROSCOPIC + REFLEX CULTURE - Abnormal; Notable for the following:    Ketones, ur 40 (*)    All other components within normal limits  CK - Abnormal;  Notable for the following:    Total CK 3062 (*)    All other components within normal limits  BASIC METABOLIC PANEL - Abnormal; Notable for the following:    Sodium 134 (*)    GFR calc non Af Amer 88 (*)    All other components within normal limits  CBC - Abnormal; Notable for the following:    RBC 4.06 (*)    Hemoglobin 12.3 (*)    HCT  37.3 (*)    All other components within normal limits  CK - Abnormal; Notable for the following:    Total CK 4081 (*)    All other components within normal limits  HEPATIC FUNCTION PANEL - Abnormal; Notable for the following:    AST 94 (*)    All other components within normal limits  CULTURE, BLOOD (ROUTINE X 2)  CULTURE, BLOOD (ROUTINE X 2)  TROPONIN I  GLUCOSE, CAPILLARY  PHENOBARBITAL LEVEL  TSH  TROPONIN I  RPR  COPPER, SERUM   Imaging Review Dg Chest 1 View  01/15/2013   CLINICAL DATA:  Fever.  Mental status changes.  EXAM: CHEST - 1 VIEW  COMPARISON:  01/14/2013  FINDINGS: Midline trachea. Normal heart size. Numerous leads and wires project over the chest. No pleural effusion or pneumothorax. Mild low low lung volumes. Improved to resolved bibasilar atelectasis. Mild right hemidiaphragm elevation.  IMPRESSION: Low lung volumes without acute disease.   Electronically Signed   By: Jeronimo Greaves M.D.   On: 01/15/2013 11:47   Dg Chest 2 View  01/14/2013   CLINICAL DATA:  Traumatic injury with pain  EXAM: CHEST  2 VIEW  COMPARISON:  06/07/2012  FINDINGS: Cardiac shadow is stable. The lungs are well aerated with mild bibasilar atelectasis. No acute bony abnormality is seen.  IMPRESSION: Mild bibasilar atelectasis.   Electronically Signed   By: Alcide Clever M.D.   On: 01/14/2013 19:52   Dg Thoracic Spine 4v  01/14/2013   CLINICAL DATA:  Traumatic injury and pain  EXAM: THORACIC SPINE - 4+ VIEW  COMPARISON:  None.  FINDINGS: No compression deformities are noted. The pedicles are within normal limits. Mild degenerative change of the low thoracic  spine is seen.  IMPRESSION: No acute abnormality noted.   Electronically Signed   By: Alcide Clever M.D.   On: 01/14/2013 19:54   Dg Pelvis 1-2 Views  01/14/2013   CLINICAL DATA:  Traumatic injury and pain  EXAM: PELVIS - 1-2 VIEW  COMPARISON:  None.  FINDINGS: There is no evidence of pelvic fracture or diastasis. No other pelvic bone lesions are seen. Postsurgical changes are seen.  IMPRESSION: No acute abnormality noted.   Electronically Signed   By: Alcide Clever M.D.   On: 01/14/2013 19:54   Ct Head Wo Contrast  01/15/2013   CLINICAL DATA:  Bilateral leg weakness.  EXAM: CT HEAD WITHOUT CONTRAST  TECHNIQUE: Contiguous axial images were obtained from the base of the skull through the vertex without intravenous contrast.  COMPARISON:  01/14/2013  FINDINGS: Extensive chronic microvascular disease throughout the deep white matter. Old right MCA infarct in the region of the insular cortex. Underlying parenchymal calcifications. No acute intracranial abnormality. Specifically, no hemorrhage, hydrocephalus, mass lesion, acute infarction, or significant intracranial injury. No acute calvarial abnormality.  Mottled appearance throughout the calvarium with numerous small hypodensities. This may be related to bone demineralization/osteoporosis, but cannot completely exclude a more aggressive process such as myeloma. Recommend clinical correlation. Paranasal sinuses are clear.  IMPRESSION: Old right MCA infarct.  Extensive chronic microvascular changes.  Mottled appearance with tiny innumerable hypodensities throughout the calvarium, question osteoporosis or a more aggressive process such as myeloma. Recommend clinical correlation.  No acute intracranial abnormality.   Electronically Signed   By: Charlett Nose M.D.   On: 01/15/2013 11:47   Ct Head (brain) Wo Contrast  01/14/2013   CLINICAL DATA:  Imbalance.  EXAM: CT HEAD WITHOUT CONTRAST  CT CERVICAL SPINE WITHOUT CONTRAST  TECHNIQUE: Multidetector CT imaging of  the head and cervical spine was performed following the standard protocol without intravenous contrast. Multiplanar CT image reconstructions of the cervical spine were also generated.  COMPARISON:  11/14/2012.  FINDINGS: CT HEAD FINDINGS  Stable enlarged ventricles and subarachnoid spaces. Stable patchy white matter low density in both cerebral hemispheres and right frontal lobe calcification. Old right thalamic and bilateral basal ganglia lacunar infarcts. No intracranial hemorrhage, mass lesion or CT evidence of acute infarction.  CT CERVICAL SPINE FINDINGS  Reversal of the normal cervical lordosis is again demonstrated. Disk space narrowing and anterior and posterior spur formation at multiple levels is unchanged. Facet degenerative changes at multiple levels. No significant canal stenosis. Moderate foraminal stenosis on the right at the C4-5 level. Mild foraminal stenosis on the right at the C5-6 level. No focal disc herniations are visualized. Bilateral carotid artery calcifications are noted.  IMPRESSION: 1. No acute head or cervical spine abnormality. 2. Stable cerebral atrophy, chronic small vessel white matter ischemic changes and old lacunar infarcts. 3. Stable reversal of the normal cervical lordosis and multilevel degenerative changes without CT evidence of cord compression.   Electronically Signed   By: Gordan Payment M.D.   On: 01/14/2013 19:18   Ct Cervical Spine Wo Contrast  01/14/2013   CLINICAL DATA:  Imbalance.  EXAM: CT HEAD WITHOUT CONTRAST  CT CERVICAL SPINE WITHOUT CONTRAST  TECHNIQUE: Multidetector CT imaging of the head and cervical spine was performed following the standard protocol without intravenous contrast. Multiplanar CT image reconstructions of the cervical spine were also generated.  COMPARISON:  11/14/2012.  FINDINGS: CT HEAD FINDINGS  Stable enlarged ventricles and subarachnoid spaces. Stable patchy white matter low density in both cerebral hemispheres and right frontal lobe  calcification. Old right thalamic and bilateral basal ganglia lacunar infarcts. No intracranial hemorrhage, mass lesion or CT evidence of acute infarction.  CT CERVICAL SPINE FINDINGS  Reversal of the normal cervical lordosis is again demonstrated. Disk space narrowing and anterior and posterior spur formation at multiple levels is unchanged. Facet degenerative changes at multiple levels. No significant canal stenosis. Moderate foraminal stenosis on the right at the C4-5 level. Mild foraminal stenosis on the right at the C5-6 level. No focal disc herniations are visualized. Bilateral carotid artery calcifications are noted.  IMPRESSION: 1. No acute head or cervical spine abnormality. 2. Stable cerebral atrophy, chronic small vessel white matter ischemic changes and old lacunar infarcts. 3. Stable reversal of the normal cervical lordosis and multilevel degenerative changes without CT evidence of cord compression.   Electronically Signed   By: Gordan Payment M.D.   On: 01/14/2013 19:18    EKG Interpretation     Ventricular Rate:    PR Interval:    QRS Duration:   QT Interval:    QTC Calculation:   R Axis:     Text Interpretation:              MDM   1. Ataxia   2. Difficulty walking   3. Glioma of brain   4. Rhabdomyolysis   5. Seizures    6:11 PM 67 y.o. male w a hx of brain tumor (in the 76's) treated w/ chemo/rad, CVA who presents with a fall which occurred today. His wife notes that he has had worsening generalized weakness for the last week. He recently had an MRI several days ago which was ordered by his neurologist for evaluation of this. Wife also notes mildly worsening confusion. The patient is alert  and oriented x2 on exam currently. He did not know the year. He states that he fell down approximately 7-10 stairs this morning after his wife left and went to work. He was unable to get himself up and his wife found him at the bottom of the stairs at 4 PM this evening. Denies loss of  consciousness and is currently complaining of mild mid thoracic pain. He is afebrile and vital signs are unremarkable here. He is unable to get out of bed on my exam. He has no focal motor or sensory deficits though. His wife states that he was in relating with a cane yesterday evening. Will workup as a stroke. Tylenol for pain.  Consulted neuro as pt unable to ambulate. Will admit to hospitalist.     Junius Argyle, MD 01/15/13 1323

## 2013-01-15 ENCOUNTER — Encounter (HOSPITAL_COMMUNITY): Payer: Self-pay | Admitting: Internal Medicine

## 2013-01-15 ENCOUNTER — Inpatient Hospital Stay (HOSPITAL_COMMUNITY): Payer: Medicare Other

## 2013-01-15 DIAGNOSIS — R569 Unspecified convulsions: Secondary | ICD-10-CM

## 2013-01-15 DIAGNOSIS — R262 Difficulty in walking, not elsewhere classified: Secondary | ICD-10-CM | POA: Diagnosis present

## 2013-01-15 DIAGNOSIS — C719 Malignant neoplasm of brain, unspecified: Secondary | ICD-10-CM

## 2013-01-15 DIAGNOSIS — M6282 Rhabdomyolysis: Principal | ICD-10-CM | POA: Diagnosis present

## 2013-01-15 DIAGNOSIS — R279 Unspecified lack of coordination: Secondary | ICD-10-CM

## 2013-01-15 LAB — HEPATIC FUNCTION PANEL
Albumin: 3.8 g/dL (ref 3.5–5.2)
Total Bilirubin: 0.3 mg/dL (ref 0.3–1.2)
Total Protein: 7 g/dL (ref 6.0–8.3)

## 2013-01-15 LAB — URINALYSIS W MICROSCOPIC + REFLEX CULTURE
Bilirubin Urine: NEGATIVE
Glucose, UA: NEGATIVE mg/dL
Hgb urine dipstick: NEGATIVE
Ketones, ur: 40 mg/dL — AB
Protein, ur: NEGATIVE mg/dL
Urobilinogen, UA: 0.2 mg/dL (ref 0.0–1.0)
pH: 6 (ref 5.0–8.0)

## 2013-01-15 LAB — CBC
HCT: 35.6 % — ABNORMAL LOW (ref 39.0–52.0)
Hemoglobin: 11.6 g/dL — ABNORMAL LOW (ref 13.0–17.0)
Hemoglobin: 12.3 g/dL — ABNORMAL LOW (ref 13.0–17.0)
MCH: 29.9 pg (ref 26.0–34.0)
MCH: 30.3 pg (ref 26.0–34.0)
MCV: 91.9 fL (ref 78.0–100.0)
Platelets: 193 10*3/uL (ref 150–400)
RBC: 3.88 MIL/uL — ABNORMAL LOW (ref 4.22–5.81)
RBC: 4.06 MIL/uL — ABNORMAL LOW (ref 4.22–5.81)
RDW: 13.4 % (ref 11.5–15.5)
RDW: 13.3 % (ref 11.5–15.5)
WBC: 6.6 10*3/uL (ref 4.0–10.5)

## 2013-01-15 LAB — DIFFERENTIAL
Lymphs Abs: 1.1 10*3/uL (ref 0.7–4.0)
Monocytes Absolute: 1.1 10*3/uL — ABNORMAL HIGH (ref 0.1–1.0)
Monocytes Relative: 17 % — ABNORMAL HIGH (ref 3–12)
Neutro Abs: 4 10*3/uL (ref 1.7–7.7)
Neutrophils Relative %: 64 % (ref 43–77)

## 2013-01-15 LAB — BASIC METABOLIC PANEL
CO2: 23 mEq/L (ref 19–32)
Calcium: 8.7 mg/dL (ref 8.4–10.5)
Chloride: 98 mEq/L (ref 96–112)
Creatinine, Ser: 0.87 mg/dL (ref 0.50–1.35)
GFR calc Af Amer: >90 mL/min (ref >90)
Sodium: 134 mEq/L — ABNORMAL LOW (ref 135–145)

## 2013-01-15 LAB — RPR: RPR Ser Ql: NONREACTIVE

## 2013-01-15 LAB — TSH: TSH: 1.092 u[IU]/mL (ref 0.350–4.500)

## 2013-01-15 LAB — TROPONIN I: Troponin I: <0.3 ng/mL (ref <0.30)

## 2013-01-15 MED ORDER — LEVETIRACETAM 500 MG PO TABS
1000.0000 mg | ORAL_TABLET | Freq: Every day | ORAL | Status: DC
  Administered 2013-01-15: 1000 mg via ORAL
  Filled 2013-01-15 (×2): qty 2

## 2013-01-15 MED ORDER — SODIUM CHLORIDE 0.9 % IV SOLN
INTRAVENOUS | Status: AC
  Administered 2013-01-15 – 2013-01-17 (×6): via INTRAVENOUS

## 2013-01-15 MED ORDER — PHENOBARBITAL 97.2 MG PO TABS
97.2000 mg | ORAL_TABLET | Freq: Every day | ORAL | Status: DC
  Administered 2013-01-15 – 2013-01-19 (×5): 97.2 mg via ORAL
  Filled 2013-01-15 (×5): qty 1

## 2013-01-15 MED ORDER — DONEPEZIL HCL 5 MG PO TABS: 5.0000 mg | ORAL_TABLET | Freq: Every day | ORAL | Status: DC

## 2013-01-15 MED ORDER — PHENOBARBITAL 97.2 MG PO TABS: 97.2000 mg | ORAL_TABLET | Freq: Every day | ORAL | Status: DC

## 2013-01-15 MED ORDER — CLOPIDOGREL BISULFATE 75 MG PO TABS: 75.0000 mg | ORAL_TABLET | Freq: Every day | ORAL | Status: DC

## 2013-01-15 MED ORDER — SODIUM CHLORIDE 0.9 % IV SOLN
500.0000 mg | Freq: Every morning | INTRAVENOUS | Status: DC
  Filled 2013-01-15: qty 5

## 2013-01-15 MED ORDER — ATORVASTATIN CALCIUM 40 MG PO TABS: 40.0000 mg | ORAL_TABLET | Freq: Every day | ORAL | Status: DC

## 2013-01-15 MED ORDER — LORATADINE 10 MG PO TABS
10.0000 mg | ORAL_TABLET | Freq: Every day | ORAL | Status: DC
  Administered 2013-01-16 – 2013-01-20 (×5): 10 mg via ORAL
  Filled 2013-01-15 (×6): qty 1

## 2013-01-15 MED ORDER — ONDANSETRON HCL 4 MG/2ML IJ SOLN: 4.0000 mg | Freq: Four times a day (QID) | INTRAMUSCULAR | Status: DC | PRN

## 2013-01-15 MED ORDER — FESOTERODINE FUMARATE ER 8 MG PO TB24
8.0000 mg | ORAL_TABLET | Freq: Every day | ORAL | Status: DC
  Administered 2013-01-16 – 2013-01-20 (×5): 8 mg via ORAL
  Filled 2013-01-15 (×7): qty 1

## 2013-01-15 MED ORDER — LEVETIRACETAM 500 MG PO TABS: 500.0000 mg | ORAL_TABLET | Freq: Two times a day (BID) | ORAL | Status: DC

## 2013-01-15 MED ORDER — SODIUM CHLORIDE 0.9 % IV SOLN
1000.0000 mg | Freq: Every day | INTRAVENOUS | Status: DC
  Administered 2013-01-15: 1000 mg via INTRAVENOUS
  Filled 2013-01-15: qty 10

## 2013-01-15 MED ORDER — DONEPEZIL HCL 5 MG PO TABS
5.0000 mg | ORAL_TABLET | Freq: Every day | ORAL | Status: DC
  Administered 2013-01-15 – 2013-01-19 (×5): 5 mg via ORAL
  Filled 2013-01-15 (×7): qty 1

## 2013-01-15 MED ORDER — ONDANSETRON HCL 4 MG PO TABS: 4.0000 mg | ORAL_TABLET | Freq: Four times a day (QID) | ORAL | Status: DC | PRN

## 2013-01-15 MED ORDER — ACETAMINOPHEN 325 MG PO TABS
650.0000 mg | ORAL_TABLET | Freq: Four times a day (QID) | ORAL | Status: DC | PRN
  Administered 2013-01-15 – 2013-01-20 (×5): 650 mg via ORAL
  Filled 2013-01-15 (×5): qty 2

## 2013-01-15 MED ORDER — SODIUM CHLORIDE 0.9 % IJ SOLN
3.0000 mL | Freq: Two times a day (BID) | INTRAMUSCULAR | Status: DC
  Administered 2013-01-15 – 2013-01-20 (×8): 3 mL via INTRAVENOUS

## 2013-01-15 MED ORDER — ACETAMINOPHEN 650 MG RE SUPP
650.0000 mg | Freq: Four times a day (QID) | RECTAL | Status: DC | PRN
  Administered 2013-01-15 (×2): 650 mg via RECTAL
  Filled 2013-01-15 (×2): qty 1

## 2013-01-15 MED ORDER — LEVETIRACETAM 500 MG PO TABS
500.0000 mg | ORAL_TABLET | Freq: Every morning | ORAL | Status: DC
  Filled 2013-01-15: qty 1

## 2013-01-15 MED ORDER — FLUTICASONE PROPIONATE 50 MCG/ACT NA SUSP
2.0000 | Freq: Every day | NASAL | Status: DC
  Administered 2013-01-16 – 2013-01-20 (×5): 2 via NASAL
  Filled 2013-01-15 (×2): qty 16

## 2013-01-15 MED ORDER — CLOPIDOGREL BISULFATE 75 MG PO TABS
75.0000 mg | ORAL_TABLET | Freq: Every day | ORAL | Status: DC
  Administered 2013-01-15 – 2013-01-19 (×5): 75 mg via ORAL
  Filled 2013-01-15 (×5): qty 1

## 2013-01-15 NOTE — Progress Notes (Signed)
Patient developed reduced responsiveness and slurred speech earlier today. No focal weakness was noted. At the time and developed fever with a temperature of 102.8. Source of fevers but clear. Chest x-ray was unremarkable. Stat CT scan of his head was obtained which showed no acute intracranial abnormality. Blood cultures are pending. His most recent temperature at 1300 was 100.8. She's currently not on antibiotic treatment.  Patient is slightly lethargic and is confused. He is disoriented to place as well as time. As no indication of focal weakness. He moves both upper extremities well with equal strength. Speech is still moderately slurred.  Recommending no changes in current management from a neurologic standpoint. We will continue to follow this patient closely with you.  Venetia Maxon M.D. [facility] [phone]

## 2013-01-15 NOTE — Evaluation (Addendum)
Physical Therapy Evaluation Patient Details Name: Rodney Potts MRN: [id number] DOB: 1946/02/09 Today's Date: 01/15/2013 Time: 4098-1191 PT Time Calculation (min): 52 min  PT Assessment / Plan / Recommendation History of Present Illness  Rodney Potts is a 67 y.o. male with a history of brain tumor status post radiation in 1982 who has been having difficulty with falls over the past year. He had a stroke in April of this year, and had difficulty following that but had improved to the point that he was able to walk without a cane. Wife reports that even this weekend he was carrying wood into the house.Over the past couple weeks, however, he has had worsening of his gait and then had a fall down some stairs and was unable to get up from the bottom. Wife reports min cognitive deficits primarily affecting memory due to prior stroke.   Clinical Impression  Pt noted to be confused upon entry, no family available to provide baseline. Wet vocal quality. Pt providing conflicting information on home environment. With bed mobility pt followed one step commands inconsistently. Wife entered room while PT sitting up pt. Upon sitting edge of bed (+1 total assist) pt noted to have strong Lt. Trunk flexion and Lt. Head flexion. Pt able to correct with commands and turn head to Rt./attend to Rt. Wife confirmed pt is significantly different cognitively and functionally from last night when she left. Drool escaping from mouth and nurse called to room for concerns of stroke, vitals taken and rapid response called.   Depending on course of current symptoms pt may benefit from CIR but pt difficult to assess due to medical complications. Will follow pt acutely when appropriate for deficits listed in PT.    PT Assessment  Patient needs continued PT services    Follow Up Recommendations  CIR;SNF;Supervision/Assistance - 24 hour    Does the patient have the potential to tolerate intense rehabilitation    Depending  on resolution of current symptoms  Barriers to Discharge Decreased caregiver support;Inaccessible home environment Pt has ~7 steps to enter, 15 steps inside the house. Wife works. Wife reports they now have a handicap accessible [place] house but they are waiting to move until they sale house here.     Equipment Recommendations   (to be determined)    Recommendations for Other Services Rehab consult   Frequency Min 3X/week    Precautions / Restrictions Precautions Precautions: Fall Precaution Comments: multiple h/o falls   Pertinent Vitals/Pain Low back pain, chronic. Repositioned for comfort.      Mobility  Bed Mobility Bed Mobility: Rolling Left;Left Sidelying to Sit Rolling Left: 2: Max assist Left Sidelying to Sit: 1: +1 Total assist Details for Bed Mobility Assistance: mod/max cues for sequencing, Pt with inconsistencies with initiation. poor contribution into sitting - wife arrived at this point and with questioning wife reports that pt having significant more difficulty than yesterday and last night. Wife asking if pt had been medicated.  Transfers Transfers:  (unable to test due to stroke like symptoms) Ambulation/Gait Ambulation/Gait Assistance:  (unable to test due to stroke like symptoms)        PT Diagnosis: Difficulty walking;Generalized weakness;Altered mental status  PT Problem List: Decreased strength;Decreased activity tolerance;Decreased balance;Decreased mobility;Decreased cognition;Decreased coordination;Decreased knowledge of use of DME;Decreased knowledge of precautions;Impaired sensation;Pain PT Treatment Interventions: DME instruction;Gait training;Stair training;Functional mobility training;Therapeutic activities;Therapeutic exercise;Balance training;Neuromuscular re-education;Cognitive remediation;Patient/family education     PT Goals(Current goals can be found in the care plan section) Acute Rehab PT  Goals Patient Stated Goal: None stated PT Goal  Formulation: Patient unable to participate in goal setting Time For Goal Achievement: 01/22/13 Potential to Achieve Goals: Good  Visit Information  Last PT Received On: 01/15/13 Assistance Needed: +2 History of Present Illness: Rodney Potts is a 67 y.o. male with a history of brain tumor status post radiation in 1982 who has been having difficulty with falls over the past year. He had a stroke in April of this year, and had difficulty following that but had improved to the point that he was able to walk without a cane. Wife reports that even this weekend he was carrying wood into the house.Over the past couple weeks, however, he has had worsening of his gait and then had a fall down some stairs and was unable to get up from the bottom. Wife reports min cognitive deficits primarily affecting memory due to prior stroke.        Prior Functioning  Home Living Family/patient expects to be discharged to:: Private residence Living Arrangements: Spouse/significant other (First stated he lived alone then reports he lives with wife) Available Help at Discharge: Family;Available PRN/intermittently Type of Home: House Home Access: Stairs to enter [employer] of Steps: 7-14 (pt not the best historian) Entrance Stairs-Rails:  (pt reports Rt, chart review says Lt.) Home Layout: Two level;Able to live on main level with bedroom/bathroom Alternate Level Stairs-Number of Steps: 15 Alternate Level Stairs-Rails: Left Home Equipment: Cane - single point;Walker - 2 wheels;Bedside commode;Shower seat Additional Comments: Wife works during day. Prior Function Comments: Pt reports he was still ambulatory but having more falls than normal. A few a week Communication Communication: Receptive difficulties;Expressive difficulties Dominant Hand: Right    Cognition  Cognition Arousal/Alertness: Lethargic Behavior During Therapy: WFL for tasks assessed/performed Overall Cognitive Status:  Impaired/Different from baseline (unclear what baseline is) Area of Impairment: Orientation;Attention;Following commands;Awareness;Problem solving;Memory Orientation Level: Disoriented to;Place;Time;Situation Current Attention Level: Focused Memory: Decreased short-term memory Following Commands: Follows one step commands inconsistently Problem Solving: Slow processing;Decreased initiation;Difficulty sequencing;Requires verbal cues;Requires tactile cues General Comments: Wife unavailable at start of treatment, by end of treatment wife in room stating pt acting very different from baseline    Extremity/Trunk Assessment Upper Extremity Assessment Upper Extremity Assessment: Generalized weakness;Defer to OT evaluation Lower Extremity Assessment Lower Extremity Assessment: Generalized weakness;RLE deficits/detail;LLE deficits/detail RLE Deficits / Details: Difficult to fully assist secondary to decreased ability to follow commands consistently but pt able to dorsiflex/plantarflex full range and flex knees with tactile cues. Unable to accurately MMT. RLE Sensation: decreased light touch RLE Coordination: decreased gross motor LLE Deficits / Details: Difficult to fully assist secondary to decreased ability to follow commands consistently but pt able to dorsiflex/plantarflex full range and flex knees with tactile cues. Unable to accurately MMT. LLE Sensation: decreased light touch LLE Coordination: decreased gross motor Cervical / Trunk Assessment Cervical / Trunk Assessment: Other exceptions Cervical / Trunk Exceptions: Upon sitting pt having neck Lt. side flexion, Lean to Lt. Posterior pelvic tilt   Balance Balance Balance Assessed: Yes Static Sitting Balance Static Sitting - Balance Support: Left upper extremity supported;Feet supported Static Sitting - Level of Assistance: 1: +1 Total assist (+2 for safety) Static Sitting - Comment/# of Minutes: Pt with strong Lt. lean, for seconds at a time  pt able to follow commands to extend Lt. UE and use this extremity for support.   End of Session PT - End of Session Activity Tolerance: Treatment limited secondary to medical complications (Comment) (see PT  comments) Patient left: in bed;with call bell/phone within reach;with nursing/sitter in room;with family/visitor present;Other (comment) (rapid response) Nurse Communication: Mobility status;Other (comment) (Lt. cervical/truncal flexion, drooling out of Lt. side of mouth, Lt. facial drop)  GP     Wilhemina Bonito 01/15/2013, 11:28 AM

## 2013-01-15 NOTE — Consult Note (Signed)
Neurology Consultation Reason for consult: Falls Referring Physician: Toniann Fail, a  CC: Falls  History is obtained from: Patient  HPI: Rodney Potts is a 67 y.o. male with a history of brain tumor status post radiation in 1982 who has been having difficulty with falls over the past year. He had a stroke in April of this year, and had difficulty following that but had improved to the point that he was able to walk without a cane. Over the past couple weeks, however, he has had worsening of his gait and then today he had a fall down some stairs and was unable to get up from the bottom.  He saw a nurse practitioner with [facility] 2 days ago and had an MRI of his brain on Monday. This has not been read, but appears to be negative for acute stroke by my interpretation.   Given his down time, he was admitted for observation.  ROS: A 14 point ROS was performed and is negative except as noted in the HPI.  Past Medical History  Diagnosis Date  . Brain tumor     glioma; S/P radiation and chemotherapy (01/14/2013)  . TIA (transient ischemic attack) ~ 2011  . IBS (irritable bowel syndrome)   . Shingles   . CVA (cerebral infarction)     R thalamic  . Allergy   . Complication of anesthesia     "after knee OR woke up very wild; pulling things out, etc" (01/14/2013)  . High cholesterol   . Sleep apnea     "corrected w/OR" (01/14/2013)  . Seizures     "hx from the scar tissue where brain tumor was; on RX to control" (01/14/2013)  . Stroke     "he's had a couple; still a little weaker on the left leg, drags that foot" (01/14/2013)  . Prostate cancer     Family History: No history of neuropathy  Social History: Tob: Denies  Exam: Current vital signs: BP 131/91  Pulse 75  Temp(Src) 98.8 F (37.1 C) (Oral)  Resp 18  Ht 5\' 9"  (1.753 m)  Wt 80.06 kg (176 lb 8 oz)  BMI 26.05 kg/m2  SpO2 95% Vital signs in last 24 hours: Temp:  [97.7 F (36.5 C)-98.9 F (37.2 C)] 98.8 F (37.1 C)  (11/12 2229) Pulse Rate:  [73-89] 75 (11/12 2229) Resp:  [15-21] 18 (11/12 2229) BP: (121-134)/(70-91) 131/91 mmHg (11/12 2229) SpO2:  [94 %-99 %] 95 % (11/12 2229) Weight:  [80.06 kg (176 lb 8 oz)] 80.06 kg (176 lb 8 oz) (11/12 2229)  General: In bed, NAD CV: Regular rate and rhythm Mental Status: Patient is awake, alert, oriented to person, place, month, year, and situation. Patient is able to give a clear and coherent history. No signs of aphasia or neglect Cranial Nerves: II: Visual Fields are full. Pupils are equal, round, and reactive to light.  Discs are difficult to visualize. III,IV, VI: EOMI without ptosis or diploplia.  V: Facial sensation is symmetric to temperature VII: Facial movement is symmetric.  VIII: hearing is intact to voice X: Uvula elevates symmetrically XI: Shoulder shrug is symmetric. XII: tongue is midline without atrophy or fasciculations.  Motor: Tone is normal. Bulk is normal. 5/5 strength was present in all extremities with the exception of the left leg which did have some mild weakness with dorsi/plantar flexion Sensory: Sensation is symmetric to light touch, however vibration was decreased at the toes, and proprioception is absent at the toes bilaterally. Deep Tendon Reflexes: 2+  and symmetric in the biceps and patellae, absent at ankles Plantars: Toes are downgoing bilaterally.  Cerebellar: FNF intact on right, difficulty with left arm Gait: Not tested due to patient safety concerns    I have reviewed labs in epic and the results pertinent to this consultation are: B12 April 6 - 439 a1c April 6 - 5.5  I have reviewed the images obtained: MRI brain-chronic changes, but no acute infarction  Impression: 67 year old male with worsening falls which I feel are likely multifactorial including neuropathy, previous stroke, medication effect. Given his rapid worsening, I would like to rule out spinal stenosis as a possible etiology, as if he has a  neuropathy then this could mask signs of a myelopathy.  Recommendations: 1) MRI C-spine and T-spine 2) will check copper, RPR 3) could consider outpatient EMG 4) physical therapy   Ritta Slot, MD [facility] [phone]  If 7pm- 7am, please page neurology on call at [phone].

## 2013-01-15 NOTE — Progress Notes (Signed)
Pt was seen and examined.  H&P and orders reviewed.  Pt had an acute change in mental status this morning.  Pt has developed some weakness in lower extremities and slurring speech.  Neuro has evaluated and Stat CT head ordered.  Will follow.   Maryln Manuel, MD

## 2013-01-15 NOTE — Progress Notes (Signed)
Called to room by PT. Pt attempting to sit on edge of bed with severe left drift, drooling left facial droop noted. Speech slurred and difficult to understand. Rapid Response called, B/P 136\68 HR 64 SAO2 100% R/A Temp 100.6. Dr. Laural Benes notified. RR recommended calling neuro for further instructions. Stat CT of the head ordered. 11AM T -102.8 orally, pt down to CT for scan and then to radiology for 1V cxr. Back to room  T 102.8 Tylenol supp. Given.  More alert but cont to have difficulty managing secretions, weakness in bilateral lower extremities. PEARL. 1300 pt temp100.8 pt sleeping quietly.

## 2013-01-15 NOTE — H&P (Signed)
[facility] History and Physical  Rodney Potts ZOX:[id number] DOB: [birth year] DOA: 01/14/2013  Referring physician: ER physician. PCP: Pamelia Hoit, MD   Chief Complaint: Weakness and falls.  HPI: Rodney Potts is a 67 y.o. 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 Patient has been having frequent falls and has had a recent MRI done 2 days ago as ordered by the patient's neurologist. In the ER patient had CT head CT C-spine x-rays of the thoracic spine and pelvis and chest x-ray all of which does not show anything acute. On-call neurologist was consulted and at this time since patient is still feeling weak and unable to walk without assistance patient has been admitted for further management. Patient's CK levels also found to be high probably from fall. Patient denies any chest pain or shortness of breath nausea vomiting abdominal pain and presently is oriented x3.   Review of Systems: As presented in the history of presenting illness, rest negative.  Past Medical History  Diagnosis Date  . Brain tumor     glioma; S/P radiation and chemotherapy (01/14/2013)  . TIA (transient ischemic attack) ~ 2011  . IBS (irritable bowel syndrome)   . Shingles   . CVA (cerebral infarction)     R thalamic  . Allergy   . Complication of anesthesia     "after knee OR woke up very wild; pulling things out, etc" (01/14/2013)  . High cholesterol   . Sleep apnea     "corrected w/OR" (01/14/2013)  . Seizures     "hx from the scar tissue where brain tumor was; on RX to control" (01/14/2013)  . Stroke     "he's had a couple; still a little weaker on the left leg, drags that foot" (01/14/2013)  . Prostate cancer    Past Surgical History  Procedure  Laterality Date  . Cataract extraction w/ intraocular lens implant Bilateral 2000's    "don't know if they put lens in" (01/14/2013)  . Joint replacement    . Prostatectomy  1990's  . Uvulopalatopharyngoplasty  1980's    "for sleep apnea" (01/14/2013)  . Shoulder arthroscopy Right 2000's  . Total knee arthroplasty Bilateral 2000's   Social History:  reports that he has quit smoking. His smoking use included Cigarettes. He has a .48 pack-year smoking history. He has never used smokeless tobacco. He reports that he drinks about 4.2 ounces of alcohol per week. He reports that he does not use illicit drugs. Where does patient live home. Can patient participate in ADLs? Yes.  Allergies  Allergen Reactions  . Amoxicillin Diarrhea  . Dilaudid [Hydromorphone Hcl] Other (See Comments)    DELUSIONS  . Lactose Intolerance (Gi) Diarrhea  . Sulfa Antibiotics Other (See Comments)    Cant control bladder    Family History:  Family History  Problem Relation Age of Onset  . Hypertension Father   . Transient ischemic attack Mother   . Cancer Mother       Prior to Admission medications   Medication Sig Start Date End Date Taking? Authorizing Provider  atorvastatin (LIPITOR) 40 MG tablet Take 40 mg by mouth at bedtime.    Yes Historical Provider, MD  cetirizine (ZYRTEC) 10 MG tablet Take 10 mg by mouth  daily.   Yes Historical Provider, MD  clopidogrel (PLAVIX) 75 MG tablet Take 75 mg by mouth at bedtime.    Yes Historical Provider, MD  donepezil (ARICEPT) 5 MG tablet Take 1 tablet (5 mg total) by mouth at bedtime. 01/12/13  Yes Ronal Fear, NP  fesoterodine (TOVIAZ) 8 MG TB24 Take 8 mg by mouth daily.   Yes Historical Provider, MD  fluticasone (FLONASE) 50 MCG/ACT nasal spray Place 2 sprays into both nostrils daily.    Yes Historical Provider, MD  levETIRAcetam (KEPPRA) 500 MG tablet Take 500-1,000 mg by mouth every 12 (twelve) hours. Take 500mg  in the am & 1000mg  in the pm   Yes Historical  Provider, MD  PHENobarbital (LUMINAL) 97.2 MG tablet Take 1 tablet (97.2 mg total) by mouth at bedtime. 01/12/13  Yes Ronal Fear, NP    Physical Exam: Filed Vitals:   01/14/13 2005 01/14/13 2100 01/14/13 2131 01/14/13 2229  BP: 121/71 124/74 134/70 131/91  Pulse: 77 73 78 75  Temp:    98.8 F (37.1 C)  TempSrc:    Oral  Resp: 20 15 16 18   Height:    5\' 9"  (1.753 m)  Weight:    80.06 kg (176 lb 8 oz)  SpO2: 96% 99% 96% 95%     General:  Well-developed well-nourished.  Eyes: Anicteric no pallor.  ENT: No discharge from ears eyes nose mouth.  Neck: No mass felt.  Cardiovascular: S1-S2 heard.  Respiratory: No rhonchi or crepitations.  Abdomen: Soft nontender bowel sounds present.  Skin: No rash.  Musculoskeletal: No edema.  Psychiatric: Appears normal.  Neurologic: Alert awake oriented to time place and person. Moves all extremities.  Labs on Admission:  Basic Metabolic Panel:  Recent Labs Lab 01/14/13 1720  NA 135  K 4.8  CL 96  CO2 24  GLUCOSE 83  BUN 17  CREATININE 0.89  CALCIUM 9.8   Liver Function Tests:  Recent Labs Lab 01/14/13 1720  AST 81*  ALT 25  ALKPHOS 71  BILITOT 0.3  PROT 8.5*  ALBUMIN 4.7   No results found for this basename: LIPASE, AMYLASE,  in the last 168 hours No results found for this basename: AMMONIA,  in the last 168 hours CBC: No results found for this basename: WBC, NEUTROABS, HGB, HCT, MCV, PLT,  in the last 168 hours Cardiac Enzymes:  Recent Labs Lab 01/14/13 1720 01/14/13 1956  CKTOTAL  --  3062*  TROPONINI <0.30  --     BNP (last 3 results) No results found for this basename: PROBNP,  in the last 8760 hours CBG:  Recent Labs Lab 01/14/13 1745  GLUCAP 91    Radiological Exams on Admission: Dg Chest 2 View  01/14/2013   CLINICAL DATA:  Traumatic injury with pain  EXAM: CHEST  2 VIEW  COMPARISON:  06/07/2012  FINDINGS: Cardiac shadow is stable. The lungs are well aerated with mild bibasilar  atelectasis. No acute bony abnormality is seen.  IMPRESSION: Mild bibasilar atelectasis.   Electronically Signed   By: Alcide Clever M.D.   On: 01/14/2013 19:52   Dg Thoracic Spine 4v  01/14/2013   CLINICAL DATA:  Traumatic injury and pain  EXAM: THORACIC SPINE - 4+ VIEW  COMPARISON:  None.  FINDINGS: No compression deformities are noted. The pedicles are within normal limits. Mild degenerative change of the low thoracic spine is seen.  IMPRESSION: No acute abnormality noted.   Electronically Signed   By: Eulah Pont.D.  On: 01/14/2013 19:54   Dg Pelvis 1-2 Views  01/14/2013   CLINICAL DATA:  Traumatic injury and pain  EXAM: PELVIS - 1-2 VIEW  COMPARISON:  None.  FINDINGS: There is no evidence of pelvic fracture or diastasis. No other pelvic bone lesions are seen. Postsurgical changes are seen.  IMPRESSION: No acute abnormality noted.   Electronically Signed   By: Alcide Clever M.D.   On: 01/14/2013 19:54   Ct Head (brain) Wo Contrast  01/14/2013   CLINICAL DATA:  Imbalance.  EXAM: CT HEAD WITHOUT CONTRAST  CT CERVICAL SPINE WITHOUT CONTRAST  TECHNIQUE: Multidetector CT imaging of the head and cervical spine was performed following the standard protocol without intravenous contrast. Multiplanar CT image reconstructions of the cervical spine were also generated.  COMPARISON:  11/14/2012.  FINDINGS: CT HEAD FINDINGS  Stable enlarged ventricles and subarachnoid spaces. Stable patchy white matter low density in both cerebral hemispheres and right frontal lobe calcification. Old right thalamic and bilateral basal ganglia lacunar infarcts. No intracranial hemorrhage, mass lesion or CT evidence of acute infarction.  CT CERVICAL SPINE FINDINGS  Reversal of the normal cervical lordosis is again demonstrated. Disk space narrowing and anterior and posterior spur formation at multiple levels is unchanged. Facet degenerative changes at multiple levels. No significant canal stenosis. Moderate foraminal stenosis on  the right at the C4-5 level. Mild foraminal stenosis on the right at the C5-6 level. No focal disc herniations are visualized. Bilateral carotid artery calcifications are noted.  IMPRESSION: 1. No acute head or cervical spine abnormality. 2. Stable cerebral atrophy, chronic small vessel white matter ischemic changes and old lacunar infarcts. 3. Stable reversal of the normal cervical lordosis and multilevel degenerative changes without CT evidence of cord compression.   Electronically Signed   By: Gordan Payment M.D.   On: 01/14/2013 19:18   Ct Cervical Spine Wo Contrast  01/14/2013   CLINICAL DATA:  Imbalance.  EXAM: CT HEAD WITHOUT CONTRAST  CT CERVICAL SPINE WITHOUT CONTRAST  TECHNIQUE: Multidetector CT imaging of the head and cervical spine was performed following the standard protocol without intravenous contrast. Multiplanar CT image reconstructions of the cervical spine were also generated.  COMPARISON:  11/14/2012.  FINDINGS: CT HEAD FINDINGS  Stable enlarged ventricles and subarachnoid spaces. Stable patchy white matter low density in both cerebral hemispheres and right frontal lobe calcification. Old right thalamic and bilateral basal ganglia lacunar infarcts. No intracranial hemorrhage, mass lesion or CT evidence of acute infarction.  CT CERVICAL SPINE FINDINGS  Reversal of the normal cervical lordosis is again demonstrated. Disk space narrowing and anterior and posterior spur formation at multiple levels is unchanged. Facet degenerative changes at multiple levels. No significant canal stenosis. Moderate foraminal stenosis on the right at the C4-5 level. Mild foraminal stenosis on the right at the C5-6 level. No focal disc herniations are visualized. Bilateral carotid artery calcifications are noted.  IMPRESSION: 1. No acute head or cervical spine abnormality. 2. Stable cerebral atrophy, chronic small vessel white matter ischemic changes and old lacunar infarcts. 3. Stable reversal of the normal cervical  lordosis and multilevel degenerative changes without CT evidence of cord compression.   Electronically Signed   By: Gordan Payment M.D.   On: 01/14/2013 19:18    EKG: Independently reviewed. Normal sinus rhythm.  Assessment/Plan Active Problems:   * No active hospital problems. *   1. Generalized weakness with difficulty walking with frequent falls - have discussed with on-call neurologist Dr. Amada Jupiter. Since patient had a recent MRI  brain Dr. Amada Jupiter has advised MRI C-spine which has been already ordered. Get physical therapy consult. 2. Mild rhabdomyolysis - probably from falls. Hold statins and gently hydrate and closely follow CK. 3. History of seizures - continue present medications. 4. History of CVA - continue antiplatelet agents. 5. History of glioma of the brain status post chemoradiation.    Code Status: Full code.  Family Communication: None.  Disposition Plan: Admit to inpatient.    Sebastain Fishbaugh N. [facility] Pager [phone].  If 7PM-7AM, please contact night-coverage [email or web address] Password [email or web address] 01/15/2013, 2:02 AM

## 2013-01-15 NOTE — Evaluation (Signed)
Clinical/Bedside Swallow Evaluation Patient Details  Name: Rodney Potts MRN: [id number] Date of Birth: 1945-09-28  Today's Date: 01/15/2013 Time: 1600-1620 SLP Time Calculation (min): 20 min  Past Medical History:  Past Medical History  Diagnosis Date  . Brain tumor     glioma; S/P radiation and chemotherapy (01/14/2013)  . TIA (transient ischemic attack) ~ 2011  . IBS (irritable bowel syndrome)   . Shingles   . CVA (cerebral infarction)     R thalamic  . Allergy   . Complication of anesthesia     "after knee OR woke up very wild; pulling things out, etc" (01/14/2013)  . High cholesterol   . Sleep apnea     "corrected w/OR" (01/14/2013)  . Seizures     "hx from the scar tissue where brain tumor was; on RX to control" (01/14/2013)  . Stroke     "he's had a couple; still a little weaker on the left leg, drags that foot" (01/14/2013)  . Prostate cancer    Past Surgical History:  Past Surgical History  Procedure Laterality Date  . Cataract extraction w/ intraocular lens implant Bilateral 2000's    "don't know if they put lens in" (01/14/2013)  . Joint replacement    . Prostatectomy  1990's  . Uvulopalatopharyngoplasty  1980's    "for sleep apnea" (01/14/2013)  . Shoulder arthroscopy Right 2000's  . Total knee arthroplasty Bilateral 2000's   HPI:  Rodney Potts is a 67 y.o. male with a history of brain tumor status post radiation in 1982 who has been having difficulty with falls over the past year. He had a stroke in April of this year, and had difficulty following that but had improved to the point that he was able to walk without a cane. Over the past couple weeks, however, he has had worsening of his gait and then he had a fall down some stairs and was unable to get up from the bottom. On the morning of 01/15/13, pt was found to have altered mentation and per RN has seemed to have difficulty managing his secretions since that time.   Assessment / Plan /  Recommendation Clinical Impression  Pt presents with decreased level of alertness, sustained attention, and ability to follow commands which appear to impact his ability to safely consume POs at this time. Pt demonstrates oral holding with all boluses, requiring cues to initiate a swallow response, with what appeared to be intermittent groping behavior. Suspect a delayed swallow initiation, followed by multiple swallows and a wet vocal quality with all consistencies tested. Delayed, strong cough reflex observed after trials of puree. RN reports difficulty managing secretions throughout the day as well. Given his observed swallowing and cognitive impairments at this time, recommend to remain NPO as his aspiration risk is high. SLP to return on the next date to assess readiness for POs versus the need for instrumental testing.    Aspiration Risk  Severe    Diet Recommendation NPO   Medication Administration: Via alternative means    Other  Recommendations Oral Care Recommendations: Oral care Q4 per protocol   Follow Up Recommendations  Inpatient Rehab    Frequency and Duration min 2x/week  2 weeks   Pertinent Vitals/Pain N/A    SLP Swallow Goals  Goals set 01/15/13. Please refer to Care Plan.   Swallow Study Prior Functional Status       General Date of Onset: 01/15/13 HPI: Rodney Potts is a 67 y.o. male with a  history of brain tumor status post radiation in 1982 who has been having difficulty with falls over the past year. He had a stroke in April of this year, and had difficulty following that but had improved to the point that he was able to walk without a cane. Over the past couple weeks, however, he has had worsening of his gait and then he had a fall down some stairs and was unable to get up from the bottom. On the morning of 01/15/13, pt was found to have altered mentation and per RN has seemed to have difficulty managing his secretions since that time. Type of Study: Bedside  swallow evaluation Previous Swallow Assessment: none in chart; wife reports oral holding of liquids starting over the last 6 months with occassional coughing Diet Prior to this Study: NPO;IV Temperature Spikes Noted: Yes Respiratory Status: Room air History of Recent Intubation: No Behavior/Cognition: Lethargic;Distractible;Requires cueing;Doesn't follow directions;Decreased sustained attention Oral Cavity - Dentition: Adequate natural dentition Self-Feeding Abilities: Total assist Patient Positioning: Upright in bed Baseline Vocal Quality: Low vocal intensity;Other (comment);Wet (low vocal intensity is baseline per pt's family) Volitional Cough: Strong;Other (Comment) (inconsistently able to elicit) Volitional Swallow: Unable to elicit (unable to consistently elicit)    Oral/Motor/Sensory Function Overall Oral Motor/Sensory Function: Impaired Labial ROM: Within Functional Limits Labial Symmetry: Within Functional Limits Labial Strength: Reduced Lingual ROM: Within Functional Limits Lingual Symmetry: Within Functional Limits Lingual Strength: Reduced Facial ROM: Within Functional Limits Mandible: Within Functional Limits   Ice Chips Ice chips: Impaired Presentation: Spoon Oral Phase Functional Implications: Oral holding Pharyngeal Phase Impairments: Suspected delayed Swallow;Decreased hyoid-laryngeal movement;Wet Vocal Quality   Thin Liquid Thin Liquid: Impaired Presentation: Cup;Straw Oral Phase Impairments: Impaired anterior to posterior transit;Poor awareness of bolus Oral Phase Functional Implications: Left anterior spillage;Prolonged oral transit;Oral residue;Oral holding Pharyngeal  Phase Impairments: Suspected delayed Swallow;Decreased hyoid-laryngeal movement;Wet Vocal Quality;Multiple swallows    Nectar Thick Nectar Thick Liquid: Not tested   Honey Thick Honey Thick Liquid: Not tested   Puree Puree: Impaired Presentation: Spoon Oral Phase Impairments: Impaired anterior  to posterior transit;Poor awareness of bolus Oral Phase Functional Implications: Oral holding Pharyngeal Phase Impairments: Suspected delayed Swallow;Decreased hyoid-laryngeal movement;Multiple swallows;Cough - Delayed   Solid   GO    Solid: Not tested       Maxcine Ham 01/15/2013,5:11 PM  Maxcine Ham, M.A. CCC-SLP [phone]

## 2013-01-15 NOTE — Progress Notes (Signed)
01/14/13 11p-7p shift. Pt.is A/Ox2, name and place and is disoriented to time. He is has had no c/o pain and no signs of distress during the shift. He had an episode of urinary incontinence so condom cath was placed by NT. Was told that patient had an oral temperature of 100.4 F during the shift. Mid level on call with [facility] was called and notified. Pt.was given tylenol po for fever, temperature check was 99.4 F. Pt.left side is weaker than his left and he needs assistance.

## 2013-01-15 NOTE — Progress Notes (Signed)
Rehab Admissions Coordinator Note:  Patient was screened by Trish Mage for appropriateness for an Inpatient Acute Rehab Consult.  Noted PT recommending CIR versus SNF.  Workup is underway for possible ?CVA.  At this time, we are recommending Inpatient Rehab consult.  Trish Mage 01/15/2013, 4:38 PM  I can be reached at [phone].

## 2013-01-15 NOTE — Significant Event (Signed)
Rapid Response Event Note  Overview: Time Called: 1005 Arrival Time: 1007 Event Type: Neurologic  Initial Focused Assessment: Called to bedside because patient leaning to Left when sitting on side of bed with PT RN stated patient "normal at 8:30am" Patient slow to respond to questions and commands.  NIHSS 8: Questions 1 (missed month) Left facial droop, Left arm decreased sensory, Bilat leg weakness 2, Dysarthria 1  Lung sounds clear, heart tones regular. Patient states pain upper abd/lower chest with cough since yesterday. Patient with difficulty maintaining his secretions, voice quality gurgly/hoarse  Temp 102.8 Orally, BP 1407/91  SR 85  RR 20 O2 sat 94% on RA Wife at bedside states he was not like this last night.  Interventions: Dr Roseanne Reno notified,  Order received for stat head CT Dr Laural Benes notified, at bedside  To assess pt, orders received for Blood cultures and labs Patient NPO until swallow eval, spoke to wife about not giving him anything by mouth. Head CT done  Event Summary: Name of Physician Notified: Dr Laural Benes at 1030  Name of Consulting Physician Notified: Dr Roseanne Reno at 1010  Outcome: Stayed in room and stabalized  Event End Time: 1200  Marcellina Millin

## 2013-01-15 NOTE — Progress Notes (Signed)
Reviewed CT head results with wife at bedside.  Maryln Manuel, MD

## 2013-01-16 ENCOUNTER — Inpatient Hospital Stay (HOSPITAL_COMMUNITY): Payer: Medicare Other

## 2013-01-16 DIAGNOSIS — I6350 Cerebral infarction due to unspecified occlusion or stenosis of unspecified cerebral artery: Secondary | ICD-10-CM

## 2013-01-16 DIAGNOSIS — R4789 Other speech disturbances: Secondary | ICD-10-CM

## 2013-01-16 DIAGNOSIS — R4701 Aphasia: Secondary | ICD-10-CM

## 2013-01-16 LAB — CBC WITH DIFFERENTIAL/PLATELET
Basophils Absolute: 0 10*3/uL (ref 0.0–0.1)
Eosinophils Relative: 0 % (ref 0–5)
HCT: 32.7 % — ABNORMAL LOW (ref 39.0–52.0)
Hemoglobin: 11 g/dL — ABNORMAL LOW (ref 13.0–17.0)
Lymphocytes Relative: 11 % — ABNORMAL LOW (ref 12–46)
Lymphs Abs: 0.9 10*3/uL (ref 0.7–4.0)
MCHC: 33.6 g/dL (ref 30.0–36.0)
MCV: 90.1 fL (ref 78.0–100.0)
Monocytes Absolute: 1.2 10*3/uL — ABNORMAL HIGH (ref 0.1–1.0)
Monocytes Relative: 16 % — ABNORMAL HIGH (ref 3–12)
Neutro Abs: 5.5 10*3/uL (ref 1.7–7.7)
Neutrophils Relative %: 73 % (ref 43–77)
RDW: 13.2 % (ref 11.5–15.5)
WBC: 7.7 10*3/uL (ref 4.0–10.5)

## 2013-01-16 LAB — LIPID PANEL
Cholesterol: 114 mg/dL (ref 0–200)
LDL Cholesterol: 50 mg/dL (ref 0–99)
Triglycerides: 49 mg/dL (ref <150)

## 2013-01-16 LAB — COMPREHENSIVE METABOLIC PANEL
AST: 77 U/L — ABNORMAL HIGH (ref 0–37)
BUN: 17 mg/dL (ref 6–23)
CO2: 20 mEq/L (ref 19–32)
Chloride: 99 mEq/L (ref 96–112)
Creatinine, Ser: 0.83 mg/dL (ref 0.50–1.35)
GFR calc non Af Amer: 90 mL/min — ABNORMAL LOW (ref >90)
Potassium: 3.9 mEq/L (ref 3.5–5.1)
Total Bilirubin: 0.2 mg/dL — ABNORMAL LOW (ref 0.3–1.2)

## 2013-01-16 LAB — COPPER, SERUM: Copper: 112 ug/dL (ref 70–175)

## 2013-01-16 LAB — CK: Total CK: 2544 U/L — ABNORMAL HIGH (ref 7–232)

## 2013-01-16 MED ORDER — STARCH (THICKENING) PO POWD: ORAL | Status: DC | PRN

## 2013-01-16 MED ORDER — SODIUM CHLORIDE 0.9 % IV SOLN
750.0000 mg | Freq: Two times a day (BID) | INTRAVENOUS | Status: DC
  Administered 2013-01-16 (×2): 750 mg via INTRAVENOUS
  Filled 2013-01-16 (×5): qty 7.5

## 2013-01-16 MED ORDER — RESOURCE THICKENUP CLEAR PO POWD
ORAL | Status: DC | PRN
  Filled 2013-01-16: qty 125

## 2013-01-16 MED ORDER — GUAIFENESIN 100 MG/5ML PO SOLN
5.0000 mL | ORAL | Status: DC | PRN
  Administered 2013-01-16 – 2013-01-19 (×5): 100 mg via ORAL
  Filled 2013-01-16 (×4): qty 5

## 2013-01-16 NOTE — Progress Notes (Signed)
Physical medicine rehabilitation consult requested in chart reviewed. Workup ongoing lethargy with history of brain tumor status post radiation in the past. Initial cranial CT scan negative. Will await formal neurological workup and followup at that time with rehabilitation consult if necessary.

## 2013-01-16 NOTE — Progress Notes (Signed)
[facility] PROGRESS NOTE  Hakan Nudelman Scroggin WGN:[id number] DOB: 1946/03/03 DOA: 01/14/2013 PCP: Pamelia Hoit, MD  Assessment/Plan: 1. Diffuse generalized weakness with difficulty with speech and LE weakness - Pt had MRI MRA this morning that is pending, Appreciate neurology following.  Pt for barium swallow evaluation this morning.  CT head neg for acute.  2. Rhabdomyolysis - PT had been down on floor for some time before being found.  CK level improving with IVF hydration, continue to follow, continue IV hydration. 3. Fever - no recent fever reported, possibly related to rhabdo, CXR neg for infection, UA neg, blood cultures neg to date.  4. History of CVA - per neuro, continuing his antiplatelet agents 5. 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 HIstory of glioma - s/p chemoradiation treatment  Code Status: Full  Family Communication: Wife at bedside Disposition Plan: rehab vs SNF   HPI/Subjective: Pt more alert today and speaking much better today. He is able to answer questions.  Has some allergy symptoms and mild cough.   Objective: Filed Vitals:   01/16/13 0828  BP: 127/78  Pulse: 77  Temp: 98.7 F (37.1 C)  Resp: 18    Intake/Output Summary (Last 24 hours) at 01/16/13 0944 Last data filed at 01/16/13 0600  Gross per 24 hour  Intake   3110 ml  Output    650 ml  Net   2460 ml   Filed Weights   01/14/13 2229  Weight: 176 lb 8 oz (80.06 kg)    Exam:  General: Well-developed well-nourished.  Eyes: Anicteric no pallor.  ENT: No discharge from ears eyes nose mouth.  Neck: No mass felt.  Cardiovascular: S1-S2 heard.  Respiratory: BBS clear, No rhonchi or crepitations.  Abdomen: Soft nontender bowel sounds present.  Skin: No rash.  Musculoskeletal: No edema.  Psychiatric: Appears normal.  Neurologic: Alert awake oriented to time place and person. Moves all extremities.  Data Reviewed: Basic  Metabolic Panel:  Recent Labs Lab 01/14/13 1720 01/15/13 0405 01/16/13 0437  NA 135 134* 133*  K 4.8 4.0 3.9  CL 96 98 99  CO2 24 23 20   GLUCOSE 83 78 88  BUN 17 16 17   CREATININE 0.89 0.87 0.83  CALCIUM 9.8 8.7 7.5*   Liver Function Tests:  Recent Labs Lab 01/14/13 1720 01/15/13 0405 01/16/13 0437  AST 81* 94* 77*  ALT 25 25 24   ALKPHOS 71 55 41  BILITOT 0.3 0.3 0.2*  PROT 8.5* 7.0 5.7*  ALBUMIN 4.7 3.8 2.9*   No results found for this basename: LIPASE, AMYLASE,  in the last 168 hours No results found for this basename: AMMONIA,  in the last 168 hours CBC:  Recent Labs Lab 01/15/13 0134 01/15/13 0405 01/16/13 0437  WBC 6.2 6.6 7.7  NEUTROABS 4.0  --  5.5  HGB 11.6* 12.3* 11.0*  HCT 35.6* 37.3* 32.7*  MCV 91.8 91.9 90.1  PLT 195 193 170   Cardiac Enzymes:  Recent Labs Lab 01/14/13 1720 01/14/13 1956 01/15/13 0405 01/16/13 0437  CKTOTAL  --  3062* 4081* 2544*  TROPONINI <0.30  --  <0.30  --    BNP (last 3 results) No results found for this basename: PROBNP,  in the last 8760 hours CBG:  Recent Labs Lab 01/14/13 1745  GLUCAP 91    Recent Results (from the past 240 hour(s))  CULTURE, BLOOD (ROUTINE X 2)     Status: None  Collection Time    01/15/13 12:45 PM      Result Value Range Status   Specimen Description BLOOD LEFT HAND   Final   Special Requests BOTTLES DRAWN AEROBIC ONLY 1.5CC   Final   Culture  Setup Time     Final   Value: 01/15/2013 17:39     Performed at [facility]   Culture     Final   Value:        BLOOD CULTURE RECEIVED NO GROWTH TO DATE CULTURE WILL BE HELD FOR 5 DAYS BEFORE ISSUING A FINAL NEGATIVE REPORT     Performed at [facility]   Report Status PENDING   Incomplete  CULTURE, BLOOD (ROUTINE X 2)     Status: None   Collection Time    01/15/13 12:52 PM      Result Value Range Status   Specimen Description BLOOD LEFT FINGER   Final   Special Requests BOTTLES DRAWN AEROBIC ONLY 2CC   Final    Culture  Setup Time     Final   Value: 01/15/2013 17:39     Performed at [facility]   Culture     Final   Value:        BLOOD CULTURE RECEIVED NO GROWTH TO DATE CULTURE WILL BE HELD FOR 5 DAYS BEFORE ISSUING A FINAL NEGATIVE REPORT     Performed at [facility]   Report Status PENDING   Incomplete     Studies: Dg Chest 1 View  01/15/2013   CLINICAL DATA:  Fever.  Mental status changes.  EXAM: CHEST - 1 VIEW  COMPARISON:  01/14/2013  FINDINGS: Midline trachea. Normal heart size. Numerous leads and wires project over the chest. No pleural effusion or pneumothorax. Mild low low lung volumes. Improved to resolved bibasilar atelectasis. Mild right hemidiaphragm elevation.  IMPRESSION: Low lung volumes without acute disease.   Electronically Signed   By: Jeronimo Greaves M.D.   On: 01/15/2013 11:47   Dg Chest 2 View  01/14/2013   CLINICAL DATA:  Traumatic injury with pain  EXAM: CHEST  2 VIEW  COMPARISON:  06/07/2012  FINDINGS: Cardiac shadow is stable. The lungs are well aerated with mild bibasilar atelectasis. No acute bony abnormality is seen.  IMPRESSION: Mild bibasilar atelectasis.   Electronically Signed   By: Alcide Clever M.D.   On: 01/14/2013 19:52   Dg Thoracic Spine 4v  01/14/2013   CLINICAL DATA:  Traumatic injury and pain  EXAM: THORACIC SPINE - 4+ VIEW  COMPARISON:  None.  FINDINGS: No compression deformities are noted. The pedicles are within normal limits. Mild degenerative change of the low thoracic spine is seen.  IMPRESSION: No acute abnormality noted.   Electronically Signed   By: Alcide Clever M.D.   On: 01/14/2013 19:54   Dg Pelvis 1-2 Views  01/14/2013   CLINICAL DATA:  Traumatic injury and pain  EXAM: PELVIS - 1-2 VIEW  COMPARISON:  None.  FINDINGS: There is no evidence of pelvic fracture or diastasis. No other pelvic bone lesions are seen. Postsurgical changes are seen.  IMPRESSION: No acute abnormality noted.   Electronically Signed   By: Alcide Clever M.D.    On: 01/14/2013 19:54   Ct Head Wo Contrast  01/15/2013   CLINICAL DATA:  Bilateral leg weakness.  EXAM: CT HEAD WITHOUT CONTRAST  TECHNIQUE: Contiguous axial images were obtained from the base of the skull through the vertex without intravenous contrast.  COMPARISON:  01/14/2013  FINDINGS: Extensive chronic microvascular disease throughout the deep white matter. Old right MCA infarct in the region of the insular cortex. Underlying parenchymal calcifications. No acute intracranial abnormality. Specifically, no hemorrhage, hydrocephalus, mass lesion, acute infarction, or significant intracranial injury. No acute calvarial abnormality.  Mottled appearance throughout the calvarium with numerous small hypodensities. This may be related to bone demineralization/osteoporosis, but cannot completely exclude a more aggressive process such as myeloma. Recommend clinical correlation. Paranasal sinuses are clear.  IMPRESSION: Old right MCA infarct.  Extensive chronic microvascular changes.  Mottled appearance with tiny innumerable hypodensities throughout the calvarium, question osteoporosis or a more aggressive process such as myeloma. Recommend clinical correlation.  No acute intracranial abnormality.   Electronically Signed   By: Charlett Nose M.D.   On: 01/15/2013 11:47   Ct Head (brain) Wo Contrast  01/14/2013   CLINICAL DATA:  Imbalance.  EXAM: CT HEAD WITHOUT CONTRAST  CT CERVICAL SPINE WITHOUT CONTRAST  TECHNIQUE: Multidetector CT imaging of the head and cervical spine was performed following the standard protocol without intravenous contrast. Multiplanar CT image reconstructions of the cervical spine were also generated.  COMPARISON:  11/14/2012.  FINDINGS: CT HEAD FINDINGS  Stable enlarged ventricles and subarachnoid spaces. Stable patchy white matter low density in both cerebral hemispheres and right frontal lobe calcification. Old right thalamic and bilateral basal ganglia lacunar infarcts. No intracranial  hemorrhage, mass lesion or CT evidence of acute infarction.  CT CERVICAL SPINE FINDINGS  Reversal of the normal cervical lordosis is again demonstrated. Disk space narrowing and anterior and posterior spur formation at multiple levels is unchanged. Facet degenerative changes at multiple levels. No significant canal stenosis. Moderate foraminal stenosis on the right at the C4-5 level. Mild foraminal stenosis on the right at the C5-6 level. No focal disc herniations are visualized. Bilateral carotid artery calcifications are noted.  IMPRESSION: 1. No acute head or cervical spine abnormality. 2. Stable cerebral atrophy, chronic small vessel white matter ischemic changes and old lacunar infarcts. 3. Stable reversal of the normal cervical lordosis and multilevel degenerative changes without CT evidence of cord compression.   Electronically Signed   By: Gordan Payment M.D.   On: 01/14/2013 19:18   Ct Cervical Spine Wo Contrast  01/14/2013   CLINICAL DATA:  Imbalance.  EXAM: CT HEAD WITHOUT CONTRAST  CT CERVICAL SPINE WITHOUT CONTRAST  TECHNIQUE: Multidetector CT imaging of the head and cervical spine was performed following the standard protocol without intravenous contrast. Multiplanar CT image reconstructions of the cervical spine were also generated.  COMPARISON:  11/14/2012.  FINDINGS: CT HEAD FINDINGS  Stable enlarged ventricles and subarachnoid spaces. Stable patchy white matter low density in both cerebral hemispheres and right frontal lobe calcification. Old right thalamic and bilateral basal ganglia lacunar infarcts. No intracranial hemorrhage, mass lesion or CT evidence of acute infarction.  CT CERVICAL SPINE FINDINGS  Reversal of the normal cervical lordosis is again demonstrated. Disk space narrowing and anterior and posterior spur formation at multiple levels is unchanged. Facet degenerative changes at multiple levels. No significant canal stenosis. Moderate foraminal stenosis on the right at the C4-5 level.  Mild foraminal stenosis on the right at the C5-6 level. No focal disc herniations are visualized. Bilateral carotid artery calcifications are noted.  IMPRESSION: 1. No acute head or cervical spine abnormality. 2. Stable cerebral atrophy, chronic small vessel white matter ischemic changes and old lacunar infarcts. 3. Stable reversal of the normal cervical lordosis and multilevel degenerative changes without CT evidence of cord compression.  Electronically Signed   By: Gordan Payment M.D.   On: 01/14/2013 19:18    Scheduled Meds: . clopidogrel  75 mg Oral QHS  . donepezil  5 mg Oral QHS  . fesoterodine  8 mg Oral Daily  . fluticasone  2 spray Each Nare Daily  . influenza vac split quadrivalent PF  0.5 mL Intramuscular Tomorrow-1000  . levETIRAcetam  750 mg Intravenous BID  . loratadine  10 mg Oral Daily  . phenobarbital  97.2 mg Oral QHS  . pneumococcal 23 valent vaccine  0.5 mL Intramuscular Tomorrow-1000  . sodium chloride  3 mL Intravenous Q12H   Continuous Infusions: . sodium chloride 125 mL/hr at 01/15/13 2350    Principal Problem:   Difficulty walking Active Problems:   CVA (cerebral infarction)   Seizures   OSA (obstructive sleep apnea)   Glioma of brain   Rhabdomyolysis   [facility]  [facility] Pager [phone]. If 7PM-7AM, please contact night-coverage at [email or web address], password [facility] 01/16/2013, 9:44 AM  LOS: 2 days

## 2013-01-16 NOTE — Progress Notes (Signed)
Speech Language Pathology  Patient Details Name: Rodney Potts MRN: [id number] DOB: [birth year] Today's Date: 01/16/2013 Time: 4742-5956 SLP Time Calculation (min): 17 min   Full report will be documented. Recommend:  Dys 3, nectar liquids, no straws, pills whole in applesauce  Breck Coons Chadwick.Ed [employer] [phone]  01/16/2013

## 2013-01-16 NOTE — Progress Notes (Signed)
Dr. Roseanne Reno, with Neurology, was paged to ask if they were going to round on pt today.  He stated they had tried twice today to see pt, but he was downstairs testing.  Dr. Roseanne Reno stated they would see the pt and are still following him.  Family at bedside notified.

## 2013-01-16 NOTE — Progress Notes (Signed)
Speech Language Pathology Treatment: Dysphagia  Patient Details Name: Rodney Potts MRN: [id number] DOB: 27-Feb-1946 Today's Date: 01/16/2013 Time: 4098-1191 SLP Time Calculation (min): 8 min  Assessment / Plan / Recommendation Clinical Impression  Pt. Alert for diagnostic assessment of swallow ability.  He complains of sore throat with indications of pharyngeal dysphagia including an effortful audible swallow and delayed cough.  Question velopharyngeal dysfunction and nasal regurgitation due to unusual sound during swallow.  An MBS is indicated and scheduled for  Today at 0930.   HPI HPI: Rodney Potts is a 67 y.o. male with a history of brain tumor status post radiation in 1982 who has been having difficulty with falls over the past year. He had a stroke in April of this year, and had difficulty following that but had improved to the point that he was able to walk without a cane. Over the past couple weeks, however, he has had worsening of his gait and then he had a fall down some stairs and was unable to get up from the bottom. On the morning of 01/15/13, pt was found to have altered mentation and per RN has seemed to have difficulty managing his secretions since that time.     Pertinent Vitals WDL  SLP Plan  MBS    Recommendations Diet recommendations: NPO              General recommendations: Rehab consult Oral Care Recommendations: Oral care BID Follow up Recommendations: Inpatient Rehab Plan: MBS    GO     Rodney Potts M.Ed [employer] [phone]  01/16/2013

## 2013-01-16 NOTE — Clinical Social Work Placement (Signed)
    Clinical Social Work Department CLINICAL SOCIAL WORK PLACEMENT NOTE 01/16/2013  Patient:  Rodney Potts, Rodney Potts  Account Number:  [id number] Admit date:  01/14/2013  Clinical Social Worker:  Gretta Cool, Chiropodist CSW  Date/time:  01/16/2013 04:00 PM  Clinical Social Work is seeking post-discharge placement for this patient at the following level of care:   SKILLED NURSING   (*CSW will update this form in Epic as items are completed)   01/16/2013  Patient/family provided with [facility] Department of Clinical Social Work's list of facilities offering this level of care within the geographic area requested by the patient (or if unable, by the patient's family).  01/16/2013  Patient/family informed of their freedom to choose among providers that offer the needed level of care, that participate in Medicare, Medicaid or managed care program needed by the patient, have an available bed and are willing to accept the patient.  01/16/2013  Patient/family informed of [facility]' ownership interest in [facility], as well as of the fact that they are under no obligation to receive care at this facility.  PASARR submitted to EDS on  PASARR number received from EDS on   FL2 transmitted to all facilities in geographic area requested by pt/family on   FL2 transmitted to all facilities within larger geographic area on   Patient informed that his/her managed care company has contracts with or will negotiate with  certain facilities, including the following:     Patient/family informed of bed offers received:   Patient chooses bed at  Physician recommends and patient chooses bed at    Patient to be transferred to  on   Patient to be transferred to facility by   The following physician request were entered in Epic:   Additional Comments:

## 2013-01-16 NOTE — Progress Notes (Signed)
Physical Therapy Treatment Patient Details Name: Rodney Potts MRN: [id number] DOB: [birth year] Today's Date: 01/16/2013 Time: 1520-1600 PT Time Calculation (min): 40 min  PT Assessment / Plan / Recommendation  History of Present Illness Rodney Potts [place] is a 67 y.o. male with a history of brain tumor status post radiation in 1982 who has been having difficulty with falls over the past year. He had a stroke in April of this year, and had difficulty following that but had improved to the point that he was able to walk without a cane. Wife reports that even this weekend he was carrying wood into the house.Over the past couple weeks, however, he has had worsening of his gait and then had a fall down some stairs and was unable to get up from the bottom. Wife reports min cognitive deficits primarily affecting memory due to prior stroke. Pt working with PT 11/13 noted to have significant change in physical and cognitive status, rapid response called and pt being followed closely. CT scan showed no acute changes.   PT Comments   Pt making progress towards goals, able to transfer to chair with +2 total assist. Still with impaired sitting balance (up to max assist due to random loss of control). Improved ability to follow one step commands however still struggles particularly with motor response to commands. Do not anticipate pt will be able to safely D/C home from acute setting, anticipate he will need CIR. Will continue to follow.   Follow Up Recommendations  CIR;Supervision/Assistance - 24 hour     Does the patient have the potential to tolerate intense rehabilitation   yes, motivated     Equipment Recommendations   (to be determined)    Recommendations for Other Services Rehab consult  Frequency Min 4X/week   Progress towards PT Goals Progress towards PT goals: Progressing toward goals  Plan Discharge plan needs to be updated    Precautions / Restrictions Precautions Precautions:  Fall Precaution Comments:  h/o falls   Pertinent Vitals/Pain No c/o pain    Mobility  Bed Mobility Bed Mobility: Rolling Right;Right Sidelying to Sit Rolling Right: 3: Mod assist Right Sidelying to Sit: 2: Max assist Details for Bed Mobility Assistance: Mod cues for sequencing, mod/max tactile cues and facilitation needed for initiation, motor control, and maintaining balance with transition into sitting. Pt falls back down to bed and posteriorly repeatedly. Transfers Transfers: Sit to Stand;Stand to Sit;Stand Pivot Transfers Sit to Stand: 1: +2 Total assist Sit to Stand: Patient Percentage: 60% Stand to Sit: 1: +2 Total assist Stand Pivot Transfers: 1: +2 Total assist Details for Transfer Assistance: Mod cues for sequencing, UE/LE placement. Overall max assist (+2 for safety due to decreased midline balance and decreased ability to follow commands). RW for support immediately upon standing. Decreased motor control particularly with attempt at side stepping to pivot over to chair, has to be given visual target and tactile cues to step, assist also for posterior loss of balance.  Ambulation/Gait Ambulation/Gait Assistance:  (unable to attempt today)      PT Goals (current goals can now be found in the care plan section) Acute Rehab PT Goals Patient Stated Goal: Go home  Visit Information  Last PT Received On: 01/16/13 Assistance Needed: +2 History of Present Illness: Rodney Potts is a 67 y.o. male with a history of brain tumor status post radiation in 1982 who has been having difficulty with falls over the past year. He had a stroke in April of this year,  and had difficulty following that but had improved to the point that he was able to walk without a cane. Wife reports that even this weekend he was carrying wood into the house.Over the past couple weeks, however, he has had worsening of his gait and then had a fall down some stairs and was unable to get up from the bottom. Wife  reports min cognitive deficits primarily affecting memory due to prior stroke. Pt working with PT 11/13 noted to have significant change in physical and cognitive status, rapid response called and pt being followed closely. CT scan showed no acute changes.    Subjective Data  Patient Stated Goal: Go home   Cognition  Cognition Arousal/Alertness: Lethargic (much less leathargic than evaluation) Behavior During Therapy: WFL for tasks assessed/performed Overall Cognitive Status: Impaired/Different from baseline (unclear what baseline is) Area of Impairment: Orientation;Attention;Following commands;Awareness;Problem solving;Memory Orientation Level: Disoriented to;Time Current Attention Level: Focused;Sustained Memory: Decreased short-term memory Following Commands: Follows one step commands inconsistently (improved, following commands ~70% of time) Problem Solving: Slow processing;Decreased initiation;Difficulty sequencing;Requires verbal cues;Requires tactile cues General Comments: Pt demonstrating much improvement from yesterday but still with signfiicant deficits as compared to baseline per wife    Balance  Balance Balance Assessed: Yes Static Sitting Balance Static Sitting - Balance Support: Left upper extremity supported;Feet supported Static Sitting - Level of Assistance: 2: Max assist (+2 for safety) Static Sitting - Comment/# of Minutes: Lt. lean much less today, Lt. cervical flexion remains but flexible. Randomly pt loses balance laterally and posteriorly, falls with minimal correction needing max assist to avoid hitting head on bed rail.  Dynamic Sitting Balance Dynamic Sitting - Level of Assistance: 2: Max assist;3: Mod assist Dynamic Sitting - Balance Activities: Trunk control activities;Lateral lean/weight shifting Dynamic Sitting - Comments: Practiced leaning down to bil. elbows and control to midline positioning. Difficult for pt. Facilitation of anterior pelvic tilt improves  balance but pt immediately reverts back to posterior pelvic tilt when balance lost.   End of Session PT - End of Session Equipment Utilized During Treatment: Gait belt Activity Tolerance: Patient tolerated treatment well;Patient limited by fatigue (see PT comments) Patient left: with call bell/phone within reach;with family/visitor present;in chair Nurse Communication: Mobility status (pt still with neurological symptoms not present prior to admission)   GP     Wilhemina Bonito 01/16/2013, 4:14 PM

## 2013-01-16 NOTE — Clinical Social Work Psychosocial (Signed)
    Clinical Social Work Department BRIEF PSYCHOSOCIAL ASSESSMENT 01/16/2013  Patient:  Rodney Potts, Rodney Potts     Account Number:  [id number]     Admit date:  01/14/2013  Clinical Social Worker:  Gretta Cool, Chiropodist CSW  Date/Time:  01/16/2013 04:00 PM  Referred by:  CSW  Date Referred:  01/16/2013 Referred for  SNF Placement   Other Referral:   Interview type:  Family Other interview type:    PSYCHOSOCIAL DATA Living Status:  WIFE Admitted from facility:   Level of care:   Primary support name:  Mrs. Ramseyer Primary support relationship to patient:  SPOUSE Degree of support available:   good support    CURRENT CONCERNS Current Concerns  Post-Acute Placement   Other Concerns:    SOCIAL WORK ASSESSMENT / PLAN Met with Rodney Potts and his wife to discuss discharge planning. At this time the family is interested in inpatient rehab. An explanation was given of skilled nursing and rehab as well. As a back up plan, Mrs. Due agreed to a skilled nursing facility search. We will start a bed search.   Assessment/plan status:  Psychosocial Support/Ongoing Assessment of Needs Other assessment/ plan:   Information/referral to community resources:   skilled facility list    PATIENT'S/FAMILY'S RESPONSE TO PLAN OF CARE: The spouse would like for her husband to go to CIR, but is open to other venues if necessary.

## 2013-01-16 NOTE — Evaluation (Signed)
Speech Language Pathology Evaluation Patient Details Name: Rodney Potts MRN: [id number] DOB: 1945-11-02 Today's Date: 01/16/2013 Time: 4098-1191 SLP Time Calculation (min): 17 min  Problem List:  Patient Active Problem List   Diagnosis Date Noted  . Difficulty walking 01/15/2013  . Rhabdomyolysis 01/15/2013  . Abnormality of gait 07/02/2012  . Unspecified cerebral artery occlusion with cerebral infarction 07/02/2012  . Aphasia 07/02/2012  . Encounter for long-term (current) use of other medications 07/02/2012  . TIA (transient ischemic attack) 06/08/2012  . Ataxia 06/08/2012  . Slurred speech 06/09/2011  . Weakness generalized 06/09/2011  . CVA (cerebral infarction) 03/24/2011  . Seizures 03/24/2011  . OSA (obstructive sleep apnea) 03/24/2011  . IBS (irritable bowel syndrome) 03/24/2011  . 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 Brain tumor     glioma; S/P radiation and chemotherapy (01/14/2013)  . TIA (transient ischemic attack) ~ 2011  . IBS (irritable bowel syndrome)   . Shingles   . CVA (cerebral infarction)     R thalamic  . Allergy   . Complication of anesthesia     "after knee OR woke up very wild; pulling things out, etc" (01/14/2013)  . High cholesterol   . Sleep apnea     "corrected w/OR" (01/14/2013)  . Seizures     "hx from the scar tissue where brain tumor was; on RX to control" (01/14/2013)  . Stroke     "he's had a couple; still a little weaker on the left leg, drags that foot" (01/14/2013)  . Prostate cancer    Past Surgical History:  Past Surgical History  Procedure Laterality Date  . Cataract extraction w/ intraocular lens implant Bilateral 2000's    "don't know if they put lens in" (01/14/2013)  . Joint replacement    . Prostatectomy  1990's  . Uvulopalatopharyngoplasty  1980's    "for sleep apnea" (01/14/2013)  . Shoulder arthroscopy Right 2000's  . Total knee arthroplasty Bilateral  2000's   HPI:  Rodney Potts is a 67 y.o. male with a history of brain tumor status post radiation in 1982 who has been having difficulty with falls over the past year. He had a stroke in April of this year, and had difficulty following that but had improved to the point that he was able to walk without a cane. Over the past couple weeks, however, he has had worsening of his gait and then he had a fall down some stairs and was unable to get up from the bottom. On the morning of 01/15/13, pt was found to have altered mentation and per RN has seemed to have difficulty managing his secretions since that time.     Assessment / Plan / Recommendation Clinical Impression  Pt. alert during evaluation with moderate dysarthria at sentence and conversational level specifically affecting resonance (hypernasality?) and phonation ( extremely low quality, glottal fry?).  No family present to confirm suscpicion of baseline cognitive deficits due to history of brain tumor and remote CVA.  Impairments observed with working memory, Economist and retrieval of new information and prospective memory, basic verbal problem solving and awareness (emergent and anticipatory).  Pt. will benefit from skilled ST services on acute care and CIR.     SLP Assessment  Patient needs continued Speech Lanaguage Pathology Services    Follow Up Recommendations  Inpatient Rehab    Frequency and Duration min 2x/week  2 weeks   Pertinent Vitals/Pain n/a  SLP Goals  SLP Goals Potential to Achieve Goals: Fair Potential Considerations: Previous level of function;Co-morbidities;Ability to learn/carryover information Progress/Goals/Alternative treatment plan discussed with pt/caregiver and they: Agree  SLP Evaluation Prior Functioning  Cognitive/Linguistic Baseline: Information not available (suspect deficits with hx brain tumor and remote CVA) Type of Home: House  Lives With: Spouse Vocation: Unemployed   Cognition  Overall  Cognitive Status: Difficult to assess (suspect deficts may be increased now) Arousal/Alertness: Awake/alert Orientation Level: Oriented X4 Attention: Sustained (likely deficits with higer level attention) Sustained Attention: Appears intact Memory: Impaired Memory Impairment: Storage deficit;Retrieval deficit;Decreased recall of new information;Decreased short term memory;Prospective memory (uncertain of baseline memory) Decreased Short Term Memory: Verbal basic Awareness: Impaired Awareness Impairment: Emergent impairment;Anticipatory impairment Problem Solving: Impaired Problem Solving Impairment: Verbal basic Executive Function: Organizing;Self Monitoring;Self Correcting;Sequencing Safety/Judgment: Impaired    Comprehension  Auditory Comprehension Overall Auditory Comprehension: Appears within functional limits for tasks assessed Yes/No Questions: Within Functional Limits Commands: Within Functional Limits Interfering Components: Motor planning;Processing speed EffectiveTechniques: Extra processing time Visual Recognition/Discrimination Discrimination: Not tested Reading Comprehension Reading Status: Not tested    Expression Expression Primary Mode of Expression: Verbal Verbal Expression Overall Verbal Expression: Appears within functional limits for tasks assessed Initiation: No impairment Level of Generative/Spontaneous Verbalization: Conversation Repetition:  (NT) Naming:  (not formally assessed) Pragmatics: Impairment Impairments: Topic appropriateness;Topic maintenance Written Expression Dominant Hand: Right Written Expression: Not tested   Oral / Motor Oral Motor/Sensory Function Overall Oral Motor/Sensory Function: Impaired Labial ROM: Within Functional Limits Labial Symmetry: Within Functional Limits Labial Strength: Reduced Lingual ROM: Within Functional Limits Lingual Symmetry: Within Functional Limits Lingual Strength: Reduced Facial ROM: Within  Functional Limits Facial Symmetry: Within Functional Limits Facial Strength: Within Functional Limits Velum:  (slight decr elevation, uvulaectomy) Mandible: Within Functional Limits Motor Speech Overall Motor Speech: Impaired Respiration: Within functional limits Phonation: Hoarse (extremely deep vocal quality) Resonance: Hypernasality (slight???) Articulation: Impaired Level of Impairment: Conversation Intelligibility: Intelligibility reduced Word: 75-100% accurate Phrase: 75-100% accurate Sentence: 75-100% accurate Conversation: 50-74% accurate Motor Planning: Witnin functional limits Effective Techniques: Over-articulate   GO     Breck Coons Arieh Bogue M.Ed [employer] [phone]  01/16/2013

## 2013-01-16 NOTE — Procedures (Signed)
Objective Swallowing Evaluation: Modified Barium Swallowing Study  Patient Details  Name: Rodney Potts MRN: [id number] Date of Birth: 23-Dec-1945  Today's Date: 01/16/2013 Time: 1005-1030 SLP Time Calculation (min): 25 min  Past Medical History:  Past Medical History  Diagnosis Date  . Brain tumor     glioma; S/P radiation and chemotherapy (01/14/2013)  . TIA (transient ischemic attack) ~ 2011  . IBS (irritable bowel syndrome)   . Shingles   . CVA (cerebral infarction)     R thalamic  . Allergy   . Complication of anesthesia     "after knee OR woke up very wild; pulling things out, etc" (01/14/2013)  . High cholesterol   . Sleep apnea     "corrected w/OR" (01/14/2013)  . Seizures     "hx from the scar tissue where brain tumor was; on RX to control" (01/14/2013)  . Stroke     "he's had a couple; still a little weaker on the left leg, drags that foot" (01/14/2013)  . Prostate cancer    Past Surgical History:  Past Surgical History  Procedure Laterality Date  . Cataract extraction w/ intraocular lens implant Bilateral 2000's    "don't know if they put lens in" (01/14/2013)  . Joint replacement    . Prostatectomy  1990's  . Uvulopalatopharyngoplasty  1980's    "for sleep apnea" (01/14/2013)  . Shoulder arthroscopy Right 2000's  . Total knee arthroplasty Bilateral 2000's   HPI:  Rodney Potts is a 67 y.o. male with a history of brain tumor status post radiation in 1982 who has been having difficulty with falls over the past year. He had a stroke in April of this year, and had difficulty following that but had improved to the point that he was able to walk without a cane. Over the past couple weeks, however, he has had worsening of his gait and then he had a fall down some stairs and was unable to get up from the bottom. On the morning of 01/15/13, pt was found to have altered mentation and per RN has seemed to have difficulty managing his secretions since that time.        Assessment / Plan / Recommendation Clinical Impression  Dysphagia Diagnosis: Mild oral phase dysphagia;Mild pharyngeal phase dysphagia;Moderate pharyngeal phase dysphagia Clinical impression: Pt. exhibited mild oral and mild-moderate motor based pharyngeal dysphagia.  Oral prep and transit of cracker mildly delayed.  Oropharyngeal deficits observed included reduced tongue base retraction, decreased laryngeal elevation causing laryngeal penetration with thin barium and moderate vallecular and mild pyriform sinus residue.  Chin tuck was ineffective to prevent/eliminate penetration with thin, however vallecular residue was reduced with chin tuck posture.  Recommend Dys 3 diet and nectar thick liquids, chin tuck with food/liquid, no straws, pills whole in applesauce, second swallow.   ST will continue to follow for safety and ability to upgrade po's when appropriate.    Treatment Recommendation  Therapy as outlined in treatment plan below    Diet Recommendation Dysphagia 3 (Mechanical Soft);Nectar-thick liquid   Liquid Administration via: Cup;No straw Medication Administration: Whole meds with puree Supervision: Patient able to self feed;Full supervision/cueing for compensatory strategies Compensations: Slow rate;Small sips/bites;Multiple dry swallows after each bite/sip Postural Changes and/or Swallow Maneuvers: Seated upright 90 degrees    Other  Recommendations Oral Care Recommendations: Oral care BID   Follow Up Recommendations  Inpatient Rehab    Frequency and Duration min 2x/week  2 weeks   Pertinent Vitals/Pain n/a  Reason for Referral Objectively evaluate swallowing function   Oral Phase Oral Preparation/Oral Phase Oral Phase: Impaired Oral - Solids Oral - Regular: Delayed oral transit;Weak lingual manipulation   Pharyngeal Phase Pharyngeal Phase Pharyngeal Phase: Impaired Pharyngeal - Honey Pharyngeal - Honey Teaspoon: Pharyngeal residue - valleculae;Reduced  tongue base retraction Pharyngeal - Honey Cup: Pharyngeal residue - valleculae;Reduced tongue base retraction;Pharyngeal residue - pyriform sinuses;Reduced laryngeal elevation Pharyngeal - Nectar Pharyngeal - Nectar Teaspoon: Pharyngeal residue - valleculae;Pharyngeal residue - pyriform sinuses;Reduced tongue base retraction;Reduced laryngeal elevation Pharyngeal - Nectar Cup: Pharyngeal residue - pyriform sinuses;Pharyngeal residue - valleculae;Reduced tongue base retraction;Reduced laryngeal elevation Pharyngeal - Thin Pharyngeal - Thin Teaspoon: Pharyngeal residue - valleculae;Pharyngeal residue - pyriform sinuses;Reduced tongue base retraction;Reduced laryngeal elevation;Penetration/Aspiration during swallow (chin tuck effective with tsp) Penetration/Aspiration details (thin teaspoon): Material enters airway, CONTACTS cords and not ejected out Pharyngeal - Thin Cup: Pharyngeal residue - pyriform sinuses;Pharyngeal residue - valleculae;Reduced tongue base retraction;Reduced laryngeal elevation;Penetration/Aspiration during swallow (chin tuck not consistently effective) Penetration/Aspiration details (thin cup): Material enters airway, remains ABOVE vocal cords and not ejected out Pharyngeal - Solids Pharyngeal - Puree: Pharyngeal residue - valleculae;Reduced tongue base retraction Pharyngeal - Regular: Pharyngeal residue - valleculae;Reduced tongue base retraction;Pharyngeal residue - pyriform sinuses;Reduced laryngeal elevation  Cervical Esophageal Phase    GO    Cervical Esophageal Phase Cervical Esophageal Phase: [facility]         Darrow Bussing.Ed [employer] [phone]  01/16/2013

## 2013-01-17 DIAGNOSIS — R5381 Other malaise: Secondary | ICD-10-CM

## 2013-01-17 LAB — BASIC METABOLIC PANEL
CO2: 26 mEq/L (ref 19–32)
GFR calc non Af Amer: >90 mL/min (ref >90)
Glucose, Bld: 121 mg/dL — ABNORMAL HIGH (ref 70–99)
Potassium: 3.5 mEq/L (ref 3.5–5.1)
Sodium: 139 mEq/L (ref 135–145)

## 2013-01-17 MED ORDER — LOPERAMIDE HCL 2 MG PO CAPS
2.0000 mg | ORAL_CAPSULE | ORAL | Status: DC | PRN
  Administered 2013-01-17 – 2013-01-20 (×3): 2 mg via ORAL
  Filled 2013-01-17 (×3): qty 1

## 2013-01-17 MED ORDER — LEVETIRACETAM 750 MG PO TABS
750.0000 mg | ORAL_TABLET | Freq: Two times a day (BID) | ORAL | Status: DC
  Filled 2013-01-17 (×2): qty 1

## 2013-01-17 MED ORDER — GUAIFENESIN 100 MG/5ML PO SOLN
5.0000 mL | ORAL | Status: DC | PRN
  Administered 2013-01-17 – 2013-01-20 (×8): 100 mg via ORAL
  Filled 2013-01-17 (×7): qty 5

## 2013-01-17 MED ORDER — LEVETIRACETAM 750 MG PO TABS
750.0000 mg | ORAL_TABLET | Freq: Two times a day (BID) | ORAL | Status: DC
  Administered 2013-01-17 – 2013-01-20 (×7): 750 mg via ORAL
  Filled 2013-01-17 (×8): qty 1

## 2013-01-17 NOTE — Progress Notes (Signed)
Speech Language Pathology Treatment: Dysphagia  Patient Details Name: Rodney Potts MRN: [id number] DOB: 1945/09/16 Today's Date: 01/17/2013 Time: 4098-1191 SLP Time Calculation (min): 25 min  Assessment / Plan / Recommendation Clinical Impression  Skilled dysphagia therapy consisted of education and observation with recommendations.  Pt. Recalled chin tuck strategy without cueing but required max verbal/visual/tactile assist initially for precision (he extended head prior to flexing).  No indications of decreased airway protection.  SLP demonstrated correct mixing and consistency of nectar liquids and she verbalized understanding.  Continue Dys 3, nectar consistency.  SLP will follow for ability to upgrade po's and cognition.    HPI HPI: Rodney Potts is a 67 y.o. male with a history of brain tumor status post radiation in 1982 who has been having difficulty with falls over the past year. He had a stroke in April of this year, and had difficulty following that but had improved to the point that he was able to walk without a cane. Over the past couple weeks, however, he has had worsening of his gait and then he had a fall down some stairs and was unable to get up from the bottom. On the morning of 01/15/13, pt was found to have altered mentation and per RN has seemed to have difficulty managing his secretions since that time.     Pertinent Vitals n/a  SLP Plan  Continue with current plan of care    Recommendations Diet recommendations: Dysphagia 3 (mechanical soft);Nectar-thick liquid Liquids provided via: Cup;No straw Medication Administration: Whole meds with puree Supervision: Patient able to self feed;Full supervision/cueing for compensatory strategies Compensations: Slow rate;Small sips/bites;Multiple dry swallows after each bite/sip Postural Changes and/or Swallow Maneuvers: Seated upright 90 degrees;Chin tuck (tuck chin with food/liquid)              General recommendations:  Rehab consult Oral Care Recommendations: Oral care BID Follow up Recommendations: Inpatient Rehab Plan: Continue with current plan of care    GO     Royce Macadamia M.Ed [employer] [phone]  01/17/2013

## 2013-01-17 NOTE — Progress Notes (Signed)
Subjective: Patient complained of discomfort when he coughs. He had no complaints otherwise. He has become increasingly more alert over the past couple days.   Objective: Current vital signs: BP 128/78  Pulse 75  Temp(Src) 97.7 F (36.5 C) (Axillary)  Resp 16  Ht 5\' 9"  (1.753 m)  Wt 80.06 kg (176 lb 8 oz)  BMI 26.05 kg/m2  SpO2 96%  Neurologic Exam: Alert and in no acute distress. Patient was disoriented to time as well as place. He was able to remember his correct age today. Speech was moderately slurred. No facial weakness noted. Strength of his upper extremities was normal proximally and distally. There was moderate proximal weakness of both lower extremities. Deep tendon reflexes were absent throughout except for minimal trace responses in upper extremities.  Medications: I have reviewed the patient's current medications.  Assessment/Plan: Etiology for patient's recurrent falls and lower extremity weakness remains unclear. MRI of cervical and thoracic spine showed mild to moderate degenerative changes, but no signs of cord compression. Patient had a vitamin B12 level as well as hemoglobin A1c in April 2014, both of which were normal. He has had no evidence of a recurrent acute stroke. Patient has known dementia and may well be experiencing gait apraxia as a contributor to his recurrent falls.   Recommend continuing physical therapy intervention for gait training. No further neurodiagnostic studies are indicated. We'll also recommend considering admission to a skilled facility following discharge.  We will see him in followup on an as-needed basis following this visit.  C.R. Roseanne Reno, MD [facility] [phone]  01/17/2013  10:35 AM

## 2013-01-17 NOTE — Progress Notes (Signed)
I have placed an order for OT evaluation which will be needed for insurance approval for any rehab venue with [facility]. Rehab consult will be completed on Monday. [phone]

## 2013-01-17 NOTE — Progress Notes (Signed)
[facility] PROGRESS NOTE  Rodney Potts ZOX:[id number] DOB: [birth year] DOA: 01/14/2013 PCP: Pamelia Hoit, MD  Assessment/Plan: 1. Diffuse generalized weakness with difficulty with speech and LE weakness - Appreciate neurology following. Pt had barium swallow evaluation and now on Dys 3 diet and tolerating well. . CT head neg for acute.  MRI brain reviewed, no sig changes from study 4/14.  MRI C spine/Tspine reviewed, no critical findings.   2. Rhabdomyolysis - PT had been down on floor for some time before being found. CK level improving with IVF hydration, continue to follow, continue IV hydration. Decrease rate of IVF today as pt is tolerating p.o. better. 3. Fever - no recent fever reported, possibly related to rhabdo, CXR neg for infection, UA neg, blood cultures neg to date.  4. History of CVA - per neuro, continuing his antiplatelet agents 5. 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 HIstory of glioma - s/p chemoradiation treatment  Code Status: Full  Family Communication: Wife at bedside  Disposition Plan: rehab vs SNF when bed available  HPI/Subjective: Pt is sitting up in chair eating breakfast, no complaints today, answering questions appropriately  Objective: Filed Vitals:   01/17/13 0806  BP: 128/78  Pulse: 75  Temp: 97.7 F (36.5 C)  Resp: 16    Intake/Output Summary (Last 24 hours) at 01/17/13 0841 Last data filed at 01/17/13 0800  Gross per 24 hour  Intake 2910.42 ml  Output   1800 ml  Net 1110.42 ml   Filed Weights   01/14/13 2229  Weight: 176 lb 8 oz (80.06 kg)    Exam:  General: Well-developed well-nourished.  Eyes: Anicteric no pallor.  ENT: No discharge from ears eyes nose mouth.  Neck: No mass felt.  Cardiovascular: S1-S2 heard.  Respiratory: BBS clear, No rhonchi or crepitations.  Abdomen: Soft nontender bowel sounds present.  Skin: No rash.  Musculoskeletal: No  edema.  Psychiatric: Appears normal.  Neurologic: Alert awake oriented to time place and person. Moves all extremities.  Data Reviewed: Basic Metabolic Panel:  Recent Labs Lab 01/14/13 1720 01/15/13 0405 01/16/13 0437  NA 135 134* 133*  K 4.8 4.0 3.9  CL 96 98 99  CO2 24 23 20   GLUCOSE 83 78 88  BUN 17 16 17   CREATININE 0.89 0.87 0.83  CALCIUM 9.8 8.7 7.5*   Liver Function Tests:  Recent Labs Lab 01/14/13 1720 01/15/13 0405 01/16/13 0437  AST 81* 94* 77*  ALT 25 25 24   ALKPHOS 71 55 41  BILITOT 0.3 0.3 0.2*  PROT 8.5* 7.0 5.7*  ALBUMIN 4.7 3.8 2.9*   No results found for this basename: LIPASE, AMYLASE,  in the last 168 hours No results found for this basename: AMMONIA,  in the last 168 hours CBC:  Recent Labs Lab 01/15/13 0134 01/15/13 0405 01/16/13 0437  WBC 6.2 6.6 7.7  NEUTROABS 4.0  --  5.5  HGB 11.6* 12.3* 11.0*  HCT 35.6* 37.3* 32.7*  MCV 91.8 91.9 90.1  PLT 195 193 170   Cardiac Enzymes:  Recent Labs Lab 01/14/13 1720 01/14/13 1956 01/15/13 0405 01/16/13 0437  CKTOTAL  --  3062* 4081* 2544*  TROPONINI <0.30  --  <0.30  --    BNP (last 3 results) No results found for this basename: PROBNP,  in the last 8760 hours CBG:  Recent Labs Lab 01/14/13 1745  GLUCAP 91    Recent Results (from the past 240 hour(s))  CULTURE,  BLOOD (ROUTINE X 2)     Status: None   Collection Time    01/15/13 12:45 PM      Result Value Range Status   Specimen Description BLOOD LEFT HAND   Final   Special Requests BOTTLES DRAWN AEROBIC ONLY 1.5CC   Final   Culture  Setup Time     Final   Value: 01/15/2013 17:39     Performed at [facility]   Culture     Final   Value:        BLOOD CULTURE RECEIVED NO GROWTH TO DATE CULTURE WILL BE HELD FOR 5 DAYS BEFORE ISSUING A FINAL NEGATIVE REPORT     Performed at [facility]   Report Status PENDING   Incomplete  CULTURE, BLOOD (ROUTINE X 2)     Status: None   Collection Time    01/15/13 12:52 PM       Result Value Range Status   Specimen Description BLOOD LEFT FINGER   Final   Special Requests BOTTLES DRAWN AEROBIC ONLY 2CC   Final   Culture  Setup Time     Final   Value: 01/15/2013 17:39     Performed at [facility]   Culture     Final   Value:        BLOOD CULTURE RECEIVED NO GROWTH TO DATE CULTURE WILL BE HELD FOR 5 DAYS BEFORE ISSUING A FINAL NEGATIVE REPORT     Performed at [facility]   Report Status PENDING   Incomplete     Studies: Dg Chest 1 View  01/15/2013   CLINICAL DATA:  Fever.  Mental status changes.  EXAM: CHEST - 1 VIEW  COMPARISON:  01/14/2013  FINDINGS: Midline trachea. Normal heart size. Numerous leads and wires project over the chest. No pleural effusion or pneumothorax. Mild low low lung volumes. Improved to resolved bibasilar atelectasis. Mild right hemidiaphragm elevation.  IMPRESSION: Low lung volumes without acute disease.   Electronically Signed   By: Jeronimo Greaves M.D.   On: 01/15/2013 11:47   Ct Head Wo Contrast  01/15/2013   CLINICAL DATA:  Bilateral leg weakness.  EXAM: CT HEAD WITHOUT CONTRAST  TECHNIQUE: Contiguous axial images were obtained from the base of the skull through the vertex without intravenous contrast.  COMPARISON:  01/14/2013  FINDINGS: Extensive chronic microvascular disease throughout the deep white matter. Old right MCA infarct in the region of the insular cortex. Underlying parenchymal calcifications. No acute intracranial abnormality. Specifically, no hemorrhage, hydrocephalus, mass lesion, acute infarction, or significant intracranial injury. No acute calvarial abnormality.  Mottled appearance throughout the calvarium with numerous small hypodensities. This may be related to bone demineralization/osteoporosis, but cannot completely exclude a more aggressive process such as myeloma. Recommend clinical correlation. Paranasal sinuses are clear.  IMPRESSION: Old right MCA infarct.  Extensive chronic microvascular changes.   Mottled appearance with tiny innumerable hypodensities throughout the calvarium, question osteoporosis or a more aggressive process such as myeloma. Recommend clinical correlation.  No acute intracranial abnormality.   Electronically Signed   By: Charlett Nose M.D.   On: 01/15/2013 11:47   Mr Cervical Spine Wo Contrast  01/16/2013   CLINICAL DATA:  Difficulty walking was falls recently. Rule out spinal stenosis.  EXAM: MRI CERVICAL SPINE WITHOUT CONTRAST; MRI THORACIC SPINE WITHOUT CONTRAST  TECHNIQUE: Multiplanar, multisequence MR imaging was performed. No intravenous contrast was administered.  COMPARISON:  01/14/2013 cervical spine CT. No comparison cervical spine or thoracic spine MR.  FINDINGS:  Cervical spine MR:  No focal cervical cord signal abnormality.  Bone marrow changes of the clivus, C1 and majority of C2 vertebral body may reflect changes of radiation therapy. Decreased signal intensity of remainder visualized vertebra are may reflect result of underlying anemia.  No osseous or soft tissue edema to suggest the presence of acute osseous or soft tissue injury.  Visualized paravertebral structures unremarkable. Both vertebral arteries are patent and mildly ectatic.  C2-3:  Negative.  C3-4: Minimal bulge/ osteophyte. Uncinate hypertrophy greater on the left. Mild foraminal narrowing greater on the left.  C4-5: Mild bulge. Mild spinal stenosis. Minimal cord contact without compression. Uncinate hypertrophy with mild bilateral foraminal narrowing.  C5-6: Shallow broad-based disc osteophyte complex. Mild spinal stenosis. Minimal cord contact without significant compression. Uncinate hypertrophy with mild bilateral foraminal narrowing.  C6-7: Shallow right posterior lateral protrusion. Very mild right sided spinal stenosis.  C7-T1:  Negative.  Thoracic spine MR:  Mild ectasia thoracic aorta. Visualized paravertebral structures otherwise unremarkable.  No focal thoracic cord signal abnormality.  Scattered  focal fatty deposits/ hemangiomas and endplate reactive changes without worrisome osseous lesion noted.  Mild curvature of the thoracic spine.  T1-2: Minimal foraminal narrowing.  Minimal bulge.  T2-3: Minimal bulge greater to the right. Mild facet joint degenerative changes greater on the right.  T3-4: Minimal facet joint degenerative changes.  T4-5: Minimal Schmorl's node deformity.  T5-6: Minimal bulge.  T6-7: Negative.  T7-8: Shallow protrusion greater to left. Minimal left-sided spinal stenosis. No cord compression.  T8-9: Shallow right posterior lateral protrusion. No cord compression.  T9-10: Negative.  T10-11: Posterior element hypertrophy greater on the left. Mild bulge. Mild spinal stenosis slightly greater on left. No cord compression.  T11-12: Bulge slightly greater to the right. Minimal right sided spinal stenosis. Mild facet joint degenerative changes.  T12-L1:  Negative.  IMPRESSION: Minimal to mild cervical spondylotic changes most notable C5-6 level. Although there is minimal cord contact at the C4-5 and C5-6 level, no significant cord compression noted. Please see above.  Minimal to mild thoracic spine degenerative changes without thoracic cord compression or significant spinal stenosis. Findings most prominent T10-11 level as detailed above.   Electronically Signed   By: Bridgett Larsson M.D.   On: 01/16/2013 09:52   Mr Thoracic Spine Wo Contrast  01/16/2013   CLINICAL DATA:  Difficulty walking was falls recently. Rule out spinal stenosis.  EXAM: MRI CERVICAL SPINE WITHOUT CONTRAST; MRI THORACIC SPINE WITHOUT CONTRAST  TECHNIQUE: Multiplanar, multisequence MR imaging was performed. No intravenous contrast was administered.  COMPARISON:  01/14/2013 cervical spine CT. No comparison cervical spine or thoracic spine MR.  FINDINGS: Cervical spine MR:  No focal cervical cord signal abnormality.  Bone marrow changes of the clivus, C1 and majority of C2 vertebral body may reflect changes of radiation  therapy. Decreased signal intensity of remainder visualized vertebra are may reflect result of underlying anemia.  No osseous or soft tissue edema to suggest the presence of acute osseous or soft tissue injury.  Visualized paravertebral structures unremarkable. Both vertebral arteries are patent and mildly ectatic.  C2-3:  Negative.  C3-4: Minimal bulge/ osteophyte. Uncinate hypertrophy greater on the left. Mild foraminal narrowing greater on the left.  C4-5: Mild bulge. Mild spinal stenosis. Minimal cord contact without compression. Uncinate hypertrophy with mild bilateral foraminal narrowing.  C5-6: Shallow broad-based disc osteophyte complex. Mild spinal stenosis. Minimal cord contact without significant compression. Uncinate hypertrophy with mild bilateral foraminal narrowing.  C6-7: Shallow right posterior lateral protrusion. Very  mild right sided spinal stenosis.  C7-T1:  Negative.  Thoracic spine MR:  Mild ectasia thoracic aorta. Visualized paravertebral structures otherwise unremarkable.  No focal thoracic cord signal abnormality.  Scattered focal fatty deposits/ hemangiomas and endplate reactive changes without worrisome osseous lesion noted.  Mild curvature of the thoracic spine.  T1-2: Minimal foraminal narrowing.  Minimal bulge.  T2-3: Minimal bulge greater to the right. Mild facet joint degenerative changes greater on the right.  T3-4: Minimal facet joint degenerative changes.  T4-5: Minimal Schmorl's node deformity.  T5-6: Minimal bulge.  T6-7: Negative.  T7-8: Shallow protrusion greater to left. Minimal left-sided spinal stenosis. No cord compression.  T8-9: Shallow right posterior lateral protrusion. No cord compression.  T9-10: Negative.  T10-11: Posterior element hypertrophy greater on the left. Mild bulge. Mild spinal stenosis slightly greater on left. No cord compression.  T11-12: Bulge slightly greater to the right. Minimal right sided spinal stenosis. Mild facet joint degenerative changes.   T12-L1:  Negative.  IMPRESSION: Minimal to mild cervical spondylotic changes most notable C5-6 level. Although there is minimal cord contact at the C4-5 and C5-6 level, no significant cord compression noted. Please see above.  Minimal to mild thoracic spine degenerative changes without thoracic cord compression or significant spinal stenosis. Findings most prominent T10-11 level as detailed above.   Electronically Signed   By: Bridgett Larsson M.D.   On: 01/16/2013 09:52   Dg Swallowing Func-speech Pathology  01/16/2013   Breck Coons [place], CCC-SLP     01/16/2013  1:22 PM Objective Swallowing Evaluation: Modified Barium Swallowing Study   Patient Details  Name: Rodney Potts MRN: [id number] Date of Birth: [birth year]  Today's Date: 01/16/2013 Time: 1005-1030 SLP Time Calculation (min): 25 min  Past Medical History:  Past Medical History  Diagnosis Date  . Brain tumor     glioma; S/P radiation and chemotherapy (01/14/2013)  . TIA (transient ischemic attack) ~ 2011  . IBS (irritable bowel syndrome)   . Shingles   . CVA (cerebral infarction)     R thalamic  . Allergy   . Complication of anesthesia     "after knee OR woke up very wild; pulling things out, etc"  (01/14/2013)  . High cholesterol   . Sleep apnea     "corrected w/OR" (01/14/2013)  . Seizures     "hx from the scar tissue where brain tumor was; on RX to  control" (01/14/2013)  . Stroke     "he's had a couple; still a little weaker on the left leg,  drags that foot" (01/14/2013)  . Prostate cancer    Past Surgical History:  Past Surgical History  Procedure Laterality Date  . Cataract extraction w/ intraocular lens implant Bilateral  2000's    "don't know if they put lens in" (01/14/2013)  . Joint replacement    . Prostatectomy  1990's  . Uvulopalatopharyngoplasty  1980's    "for sleep apnea" (01/14/2013)  . Shoulder arthroscopy Right 2000's  . Total knee arthroplasty Bilateral 2000's   HPI:  Terrill Alperin Coderre is a 67 y.o. male with a history of brain tumor   status post radiation in 1982 who has been having difficulty with  falls over the past year. He had a stroke in April of this year,  and had difficulty following that but had improved to the point  that he was able to walk without a cane. Over the past couple  weeks, however, he has had worsening of his gait and then he  had  a fall down some stairs and was unable to get up from the bottom.  On the morning of 01/15/13, pt was found to have altered  mentation and per RN has seemed to have difficulty managing his  secretions since that time.       Assessment / Plan / Recommendation Clinical Impression  Dysphagia Diagnosis: Mild oral phase dysphagia;Mild pharyngeal  phase dysphagia;Moderate pharyngeal phase dysphagia Clinical impression: Pt. exhibited mild oral and mild-moderate  motor based pharyngeal dysphagia.  Oral prep and transit of  cracker mildly delayed.  Oropharyngeal deficits observed included  reduced tongue base retraction, decreased laryngeal elevation  causing laryngeal penetration with thin barium and moderate  vallecular and mild pyriform sinus residue.  Chin tuck was  ineffective to prevent/eliminate penetration with thin, however  vallecular residue was reduced with chin tuck posture.  Recommend  Dys 3 diet and nectar thick liquids, chin tuck with food/liquid,  no straws, pills whole in applesauce, second swallow.   ST will  continue to follow for safety and ability to upgrade po's when  appropriate.    Treatment Recommendation  Therapy as outlined in treatment plan below    Diet Recommendation Dysphagia 3 (Mechanical Soft);Nectar-thick  liquid   Liquid Administration via: Cup;No straw Medication Administration: Whole meds with puree Supervision: Patient able to self feed;Full supervision/cueing  for compensatory strategies Compensations: Slow rate;Small sips/bites;Multiple dry swallows  after each bite/sip Postural Changes and/or Swallow Maneuvers: Seated upright 90  degrees    Other  Recommendations  Oral Care Recommendations: Oral care BID   Follow Up Recommendations  Inpatient Rehab    Frequency and Duration min 2x/week  2 weeks   Pertinent Vitals/Pain n/a            Reason for Referral Objectively evaluate swallowing function   Oral Phase Oral Preparation/Oral Phase Oral Phase: Impaired Oral - Solids Oral - Regular: Delayed oral transit;Weak lingual manipulation   Pharyngeal Phase Pharyngeal Phase Pharyngeal Phase: Impaired Pharyngeal - Honey Pharyngeal - Honey Teaspoon: Pharyngeal residue -  valleculae;Reduced tongue base retraction Pharyngeal - Honey Cup: Pharyngeal residue - valleculae;Reduced  tongue base retraction;Pharyngeal residue - pyriform  sinuses;Reduced laryngeal elevation Pharyngeal - Nectar Pharyngeal - Nectar Teaspoon: Pharyngeal residue -  valleculae;Pharyngeal residue - pyriform sinuses;Reduced tongue  base retraction;Reduced laryngeal elevation Pharyngeal - Nectar Cup: Pharyngeal residue - pyriform  sinuses;Pharyngeal residue - valleculae;Reduced tongue base  retraction;Reduced laryngeal elevation Pharyngeal - Thin Pharyngeal - Thin Teaspoon: Pharyngeal residue -  valleculae;Pharyngeal residue - pyriform sinuses;Reduced tongue  base retraction;Reduced laryngeal  elevation;Penetration/Aspiration during swallow (chin tuck  effective with tsp) Penetration/Aspiration details (thin teaspoon): Material enters  airway, CONTACTS cords and not ejected out Pharyngeal - Thin Cup: Pharyngeal residue - pyriform  sinuses;Pharyngeal residue - valleculae;Reduced tongue base  retraction;Reduced laryngeal elevation;Penetration/Aspiration  during swallow (chin tuck not consistently effective) Penetration/Aspiration details (thin cup): Material enters  airway, remains ABOVE vocal cords and not ejected out Pharyngeal - Solids Pharyngeal - Puree: Pharyngeal residue - valleculae;Reduced  tongue base retraction Pharyngeal - Regular: Pharyngeal residue - valleculae;Reduced  tongue base retraction;Pharyngeal residue  - pyriform  sinuses;Reduced laryngeal elevation  Cervical Esophageal Phase    GO    Cervical Esophageal Phase Cervical Esophageal Phase: [facility]         Darrow Bussing.Ed CCC-SLP Pager [phone]  01/16/2013    Scheduled Meds: . clopidogrel  75 mg Oral QHS  . donepezil  5 mg Oral QHS  . fesoterodine  8 mg Oral Daily  .  fluticasone  2 spray Each Nare Daily  . influenza vac split quadrivalent PF  0.5 mL Intramuscular Tomorrow-1000  . levETIRAcetam  750 mg Oral BID  . loratadine  10 mg Oral Daily  . phenobarbital  97.2 mg Oral QHS  . pneumococcal 23 valent vaccine  0.5 mL Intramuscular Tomorrow-1000  . sodium chloride  3 mL Intravenous Q12H   Continuous Infusions: . sodium chloride 125 mL/hr at 01/17/13 0445    Principal Problem:   Difficulty walking Active Problems:   CVA (cerebral infarction)   Seizures   OSA (obstructive sleep apnea)   Glioma of brain   Rhabdomyolysis   Standley Dakins MD  [facility] Pager [phone]. If 7PM-7AM, please contact night-coverage at [email or web address], password [facility] 01/17/2013, 8:41 AM  LOS: 3 days

## 2013-01-18 LAB — CK: Total CK: 1474 U/L — ABNORMAL HIGH (ref 7–232)

## 2013-01-18 MED ORDER — SODIUM CHLORIDE 0.9 % IV SOLN
INTRAVENOUS | Status: AC
  Administered 2013-01-18: 09:00:00 via INTRAVENOUS

## 2013-01-18 NOTE — Progress Notes (Signed)
Subjective: Patient had no complaints, including no issues with discomfort with coughing and he is coughing less today than yesterday.  Objective: Current vital signs: BP 144/82  Pulse 74  Temp(Src) 98.4 F (36.9 C) (Oral)  Resp 16  Ht 5\' 9"  (1.753 m)  Wt 80.06 kg (176 lb 8 oz)  BMI 26.05 kg/m2  SpO2 96%  Neurologic Exam: Alert and in no acute distress. He was well oriented to but was disoriented to place. No facial weakness was noted. Speech was only slightly dysarthric, and clearer as well as stronger than yesterday. Motor exam shows normal strength proximally and distally in upper and lower extremities. No hip flexor weakness was noted on exam today.  Medications: I have reviewed the patient's current medications.  Assessment/Plan: Improved strength compared to previous examinations during this admission. He's had MRI studies of the cervical and thoracic spine which did not show significant abnormality. CT scan of his head did show repeat stroke and clinically there does not appear to be any sign of recurrent stroke. Mental status is slightly better today.  I recommended no changes in his current management. Agree with plans for pursuing inpatient rehabilitation.  No further neurodiagnostic studies are indicated at this point. I will plan to see him in followup on a when necessary basis after this visit.  C.R. Roseanne Reno, MD [facility] [phone]  01/18/2013  9:18 AM

## 2013-01-18 NOTE — Progress Notes (Signed)
Dr. Claiborne Billings notified of patient receiving OTC cough syrup from wife.  Order received:  Educate wife concerning giving OTC cough syrup.  Wife is pleasant, understanding, and states,  "I was told I could bring it in and give it as long as I tell the nurses what time I gave it."

## 2013-01-18 NOTE — Progress Notes (Addendum)
Requires assistance with all ADL skills.  Feed breakfast following SPL guidelines.  1620   [place] in 3W04 has red rash on back that was not there yesterday when I checked with wife.  Does not c/o itching.  1909  Client's wife is administering robitussin cough syrup from outside pharmacy.  She has been spoken to about this and feels as long as she tells [place] when she gives it, it is okay.  Unable to get her to understand the importance of giving it prn.  Administrator notified.

## 2013-01-18 NOTE — Progress Notes (Signed)
[facility] PROGRESS NOTE  Rodney Potts ONG:[id number] DOB: [birth year] DOA: 01/14/2013 PCP: Pamelia Hoit, MD  Assessment/Plan: 1. Diffuse generalized weakness with difficulty with speech and LE weakness - Appreciate neurology following. Pt had barium swallow evaluation and now on Dys 3 diet and tolerating well. . CT head neg for acute. MRI brain reviewed, no sig changes from study 4/14. MRI C spine/Tspine reviewed, no critical findings. CIR consult to be done tomorrow.    2. Rhabdomyolysis - Pt had been down on floor for some time before being found. CK level improving with IVF hydration, continue to follow, reducing IV hydration. Decrease rate of IVF today as pt is tolerating p.o. better. 3. Fever - no recent fever reported, possibly related to rhabdo, CXR neg for infection, UA neg, blood cultures neg to date.  4. History of CVA - per neuro, continuing his antiplatelet agents 5. 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 HIstory of glioma - s/p chemoradiation treatment  Code Status: Full  Family Communication: Wife at bedside  Disposition Plan: rehab (hopefully CIR) when bed available   HPI/Subjective: Pt is without any complaints today  Objective: Filed Vitals:   01/18/13 0400  BP: 144/82  Pulse: 74  Temp: 98.4 F (36.9 C)  Resp: 16    Intake/Output Summary (Last 24 hours) at 01/18/13 0809 Last data filed at 01/18/13 0640  Gross per 24 hour  Intake    240 ml  Output   1000 ml  Net   -760 ml   Filed Weights   01/14/13 2229  Weight: 176 lb 8 oz (80.06 kg)    Exam:  General: Well-developed well-nourished.  Eyes: Anicteric no pallor.  ENT: No discharge from ears eyes nose mouth.  Neck: No mass felt.  Cardiovascular: S1-S2 heard.  Respiratory: BBS clear, No rhonchi or crepitations.  Abdomen: Soft nontender bowel sounds present.  Skin: No rash.  Musculoskeletal: No edema.  Psychiatric: Appears  normal.  Neurologic: Alert awake oriented to time place and person. Moves all extremities.   Data Reviewed: Basic Metabolic Panel:  Recent Labs Lab 01/14/13 1720 01/15/13 0405 01/16/13 0437 01/17/13 1000  NA 135 134* 133* 139  K 4.8 4.0 3.9 3.5  CL 96 98 99 103  CO2 24 23 20 26   GLUCOSE 83 78 88 121*  BUN 17 16 17 16   CREATININE 0.89 0.87 0.83 0.80  CALCIUM 9.8 8.7 7.5* 8.4   Liver Function Tests:  Recent Labs Lab 01/14/13 1720 01/15/13 0405 01/16/13 0437  AST 81* 94* 77*  ALT 25 25 24   ALKPHOS 71 55 41  BILITOT 0.3 0.3 0.2*  PROT 8.5* 7.0 5.7*  ALBUMIN 4.7 3.8 2.9*   No results found for this basename: LIPASE, AMYLASE,  in the last 168 hours No results found for this basename: AMMONIA,  in the last 168 hours CBC:  Recent Labs Lab 01/15/13 0134 01/15/13 0405 01/16/13 0437  WBC 6.2 6.6 7.7  NEUTROABS 4.0  --  5.5  HGB 11.6* 12.3* 11.0*  HCT 35.6* 37.3* 32.7*  MCV 91.8 91.9 90.1  PLT 195 193 170   Cardiac Enzymes:  Recent Labs Lab 01/14/13 1720 01/14/13 1956 01/15/13 0405 01/16/13 0437 01/17/13 1000 01/18/13 0532  CKTOTAL  --  3062* 4081* 2544* 2466* 1474*  TROPONINI <0.30  --  <0.30  --   --   --    BNP (last 3 results) No results found for this basename: PROBNP,  in  the last 8760 hours CBG:  Recent Labs Lab 01/14/13 1745  GLUCAP 91    Recent Results (from the past 240 hour(s))  CULTURE, BLOOD (ROUTINE X 2)     Status: None   Collection Time    01/15/13 12:45 PM      Result Value Range Status   Specimen Description BLOOD LEFT HAND   Final   Special Requests BOTTLES DRAWN AEROBIC ONLY 1.5CC   Final   Culture  Setup Time     Final   Value: 01/15/2013 17:39     Performed at [facility]   Culture     Final   Value:        BLOOD CULTURE RECEIVED NO GROWTH TO DATE CULTURE WILL BE HELD FOR 5 DAYS BEFORE ISSUING A FINAL NEGATIVE REPORT     Performed at [facility]   Report Status PENDING   Incomplete  CULTURE, BLOOD  (ROUTINE X 2)     Status: None   Collection Time    01/15/13 12:52 PM      Result Value Range Status   Specimen Description BLOOD LEFT FINGER   Final   Special Requests BOTTLES DRAWN AEROBIC ONLY 2CC   Final   Culture  Setup Time     Final   Value: 01/15/2013 17:39     Performed at [facility]   Culture     Final   Value:        BLOOD CULTURE RECEIVED NO GROWTH TO DATE CULTURE WILL BE HELD FOR 5 DAYS BEFORE ISSUING A FINAL NEGATIVE REPORT     Performed at [facility]   Report Status PENDING   Incomplete     Studies: Mr Cervical Spine Wo Contrast  01/16/2013   CLINICAL DATA:  Difficulty walking was falls recently. Rule out spinal stenosis.  EXAM: MRI CERVICAL SPINE WITHOUT CONTRAST; MRI THORACIC SPINE WITHOUT CONTRAST  TECHNIQUE: Multiplanar, multisequence MR imaging was performed. No intravenous contrast was administered.  COMPARISON:  01/14/2013 cervical spine CT. No comparison cervical spine or thoracic spine MR.  FINDINGS: Cervical spine MR:  No focal cervical cord signal abnormality.  Bone marrow changes of the clivus, C1 and majority of C2 vertebral body may reflect changes of radiation therapy. Decreased signal intensity of remainder visualized vertebra are may reflect result of underlying anemia.  No osseous or soft tissue edema to suggest the presence of acute osseous or soft tissue injury.  Visualized paravertebral structures unremarkable. Both vertebral arteries are patent and mildly ectatic.  C2-3:  Negative.  C3-4: Minimal bulge/ osteophyte. Uncinate hypertrophy greater on the left. Mild foraminal narrowing greater on the left.  C4-5: Mild bulge. Mild spinal stenosis. Minimal cord contact without compression. Uncinate hypertrophy with mild bilateral foraminal narrowing.  C5-6: Shallow broad-based disc osteophyte complex. Mild spinal stenosis. Minimal cord contact without significant compression. Uncinate hypertrophy with mild bilateral foraminal narrowing.  C6-7:  Shallow right posterior lateral protrusion. Very mild right sided spinal stenosis.  C7-T1:  Negative.  Thoracic spine MR:  Mild ectasia thoracic aorta. Visualized paravertebral structures otherwise unremarkable.  No focal thoracic cord signal abnormality.  Scattered focal fatty deposits/ hemangiomas and endplate reactive changes without worrisome osseous lesion noted.  Mild curvature of the thoracic spine.  T1-2: Minimal foraminal narrowing.  Minimal bulge.  T2-3: Minimal bulge greater to the right. Mild facet joint degenerative changes greater on the right.  T3-4: Minimal facet joint degenerative changes.  T4-5: Minimal Schmorl's node deformity.  T5-6: Minimal  bulge.  T6-7: Negative.  T7-8: Shallow protrusion greater to left. Minimal left-sided spinal stenosis. No cord compression.  T8-9: Shallow right posterior lateral protrusion. No cord compression.  T9-10: Negative.  T10-11: Posterior element hypertrophy greater on the left. Mild bulge. Mild spinal stenosis slightly greater on left. No cord compression.  T11-12: Bulge slightly greater to the right. Minimal right sided spinal stenosis. Mild facet joint degenerative changes.  T12-L1:  Negative.  IMPRESSION: Minimal to mild cervical spondylotic changes most notable C5-6 level. Although there is minimal cord contact at the C4-5 and C5-6 level, no significant cord compression noted. Please see above.  Minimal to mild thoracic spine degenerative changes without thoracic cord compression or significant spinal stenosis. Findings most prominent T10-11 level as detailed above.   Electronically Signed   By: Bridgett Larsson M.D.   On: 01/16/2013 09:52   Mr Thoracic Spine Wo Contrast  01/16/2013   CLINICAL DATA:  Difficulty walking was falls recently. Rule out spinal stenosis.  EXAM: MRI CERVICAL SPINE WITHOUT CONTRAST; MRI THORACIC SPINE WITHOUT CONTRAST  TECHNIQUE: Multiplanar, multisequence MR imaging was performed. No intravenous contrast was administered.  COMPARISON:   01/14/2013 cervical spine CT. No comparison cervical spine or thoracic spine MR.  FINDINGS: Cervical spine MR:  No focal cervical cord signal abnormality.  Bone marrow changes of the clivus, C1 and majority of C2 vertebral body may reflect changes of radiation therapy. Decreased signal intensity of remainder visualized vertebra are may reflect result of underlying anemia.  No osseous or soft tissue edema to suggest the presence of acute osseous or soft tissue injury.  Visualized paravertebral structures unremarkable. Both vertebral arteries are patent and mildly ectatic.  C2-3:  Negative.  C3-4: Minimal bulge/ osteophyte. Uncinate hypertrophy greater on the left. Mild foraminal narrowing greater on the left.  C4-5: Mild bulge. Mild spinal stenosis. Minimal cord contact without compression. Uncinate hypertrophy with mild bilateral foraminal narrowing.  C5-6: Shallow broad-based disc osteophyte complex. Mild spinal stenosis. Minimal cord contact without significant compression. Uncinate hypertrophy with mild bilateral foraminal narrowing.  C6-7: Shallow right posterior lateral protrusion. Very mild right sided spinal stenosis.  C7-T1:  Negative.  Thoracic spine MR:  Mild ectasia thoracic aorta. Visualized paravertebral structures otherwise unremarkable.  No focal thoracic cord signal abnormality.  Scattered focal fatty deposits/ hemangiomas and endplate reactive changes without worrisome osseous lesion noted.  Mild curvature of the thoracic spine.  T1-2: Minimal foraminal narrowing.  Minimal bulge.  T2-3: Minimal bulge greater to the right. Mild facet joint degenerative changes greater on the right.  T3-4: Minimal facet joint degenerative changes.  T4-5: Minimal Schmorl's node deformity.  T5-6: Minimal bulge.  T6-7: Negative.  T7-8: Shallow protrusion greater to left. Minimal left-sided spinal stenosis. No cord compression.  T8-9: Shallow right posterior lateral protrusion. No cord compression.  T9-10: Negative.   T10-11: Posterior element hypertrophy greater on the left. Mild bulge. Mild spinal stenosis slightly greater on left. No cord compression.  T11-12: Bulge slightly greater to the right. Minimal right sided spinal stenosis. Mild facet joint degenerative changes.  T12-L1:  Negative.  IMPRESSION: Minimal to mild cervical spondylotic changes most notable C5-6 level. Although there is minimal cord contact at the C4-5 and C5-6 level, no significant cord compression noted. Please see above.  Minimal to mild thoracic spine degenerative changes without thoracic cord compression or significant spinal stenosis. Findings most prominent T10-11 level as detailed above.   Electronically Signed   By: Bridgett Larsson M.D.   On: 01/16/2013 09:52  Dg Swallowing Func-speech Pathology  01/16/2013   Breck Coons [place], CCC-SLP     01/16/2013  1:22 PM Objective Swallowing Evaluation: Modified Barium Swallowing Study   Patient Details  Name: Rodney Potts MRN: [id number] Date of Birth: 01-Feb-1946  Today's Date: 01/16/2013 Time: 1005-1030 SLP Time Calculation (min): 25 min  Past Medical History:  Past Medical History  Diagnosis Date  . Brain tumor     glioma; S/P radiation and chemotherapy (01/14/2013)  . TIA (transient ischemic attack) ~ 2011  . IBS (irritable bowel syndrome)   . Shingles   . CVA (cerebral infarction)     R thalamic  . Allergy   . Complication of anesthesia     "after knee OR woke up very wild; pulling things out, etc"  (01/14/2013)  . High cholesterol   . Sleep apnea     "corrected w/OR" (01/14/2013)  . Seizures     "hx from the scar tissue where brain tumor was; on RX to  control" (01/14/2013)  . Stroke     "he's had a couple; still a little weaker on the left leg,  drags that foot" (01/14/2013)  . Prostate cancer    Past Surgical History:  Past Surgical History  Procedure Laterality Date  . Cataract extraction w/ intraocular lens implant Bilateral  2000's    "don't know if they put lens in" (01/14/2013)  . Joint  replacement    . Prostatectomy  1990's  . Uvulopalatopharyngoplasty  1980's    "for sleep apnea" (01/14/2013)  . Shoulder arthroscopy Right 2000's  . Total knee arthroplasty Bilateral 2000's   HPI:  Rodney Potts is a 67 y.o. male with a history of brain tumor  status post radiation in 1982 who has been having difficulty with  falls over the past year. He had a stroke in April of this year,  and had difficulty following that but had improved to the point  that he was able to walk without a cane. Over the past couple  weeks, however, he has had worsening of his gait and then he had  a fall down some stairs and was unable to get up from the bottom.  On the morning of 01/15/13, pt was found to have altered  mentation and per RN has seemed to have difficulty managing his  secretions since that time.       Assessment / Plan / Recommendation Clinical Impression  Dysphagia Diagnosis: Mild oral phase dysphagia;Mild pharyngeal  phase dysphagia;Moderate pharyngeal phase dysphagia Clinical impression: Pt. exhibited mild oral and mild-moderate  motor based pharyngeal dysphagia.  Oral prep and transit of  cracker mildly delayed.  Oropharyngeal deficits observed included  reduced tongue base retraction, decreased laryngeal elevation  causing laryngeal penetration with thin barium and moderate  vallecular and mild pyriform sinus residue.  Chin tuck was  ineffective to prevent/eliminate penetration with thin, however  vallecular residue was reduced with chin tuck posture.  Recommend  Dys 3 diet and nectar thick liquids, chin tuck with food/liquid,  no straws, pills whole in applesauce, second swallow.   ST will  continue to follow for safety and ability to upgrade po's when  appropriate.    Treatment Recommendation  Therapy as outlined in treatment plan below    Diet Recommendation Dysphagia 3 (Mechanical Soft);Nectar-thick  liquid   Liquid Administration via: Cup;No straw Medication Administration: Whole meds with puree  Supervision: Patient able to self feed;Full supervision/cueing  for compensatory strategies Compensations: Slow rate;Small sips/bites;Multiple dry swallows  after each bite/sip Postural Changes and/or Swallow Maneuvers: Seated upright 90  degrees    Other  Recommendations Oral Care Recommendations: Oral care BID   Follow Up Recommendations  Inpatient Rehab    Frequency and Duration min 2x/week  2 weeks   Pertinent Vitals/Pain n/a            Reason for Referral Objectively evaluate swallowing function   Oral Phase Oral Preparation/Oral Phase Oral Phase: Impaired Oral - Solids Oral - Regular: Delayed oral transit;Weak lingual manipulation   Pharyngeal Phase Pharyngeal Phase Pharyngeal Phase: Impaired Pharyngeal - Honey Pharyngeal - Honey Teaspoon: Pharyngeal residue -  valleculae;Reduced tongue base retraction Pharyngeal - Honey Cup: Pharyngeal residue - valleculae;Reduced  tongue base retraction;Pharyngeal residue - pyriform  sinuses;Reduced laryngeal elevation Pharyngeal - Nectar Pharyngeal - Nectar Teaspoon: Pharyngeal residue -  valleculae;Pharyngeal residue - pyriform sinuses;Reduced tongue  base retraction;Reduced laryngeal elevation Pharyngeal - Nectar Cup: Pharyngeal residue - pyriform  sinuses;Pharyngeal residue - valleculae;Reduced tongue base  retraction;Reduced laryngeal elevation Pharyngeal - Thin Pharyngeal - Thin Teaspoon: Pharyngeal residue -  valleculae;Pharyngeal residue - pyriform sinuses;Reduced tongue  base retraction;Reduced laryngeal  elevation;Penetration/Aspiration during swallow (chin tuck  effective with tsp) Penetration/Aspiration details (thin teaspoon): Material enters  airway, CONTACTS cords and not ejected out Pharyngeal - Thin Cup: Pharyngeal residue - pyriform  sinuses;Pharyngeal residue - valleculae;Reduced tongue base  retraction;Reduced laryngeal elevation;Penetration/Aspiration  during swallow (chin tuck not consistently effective) Penetration/Aspiration details (thin cup):  Material enters  airway, remains ABOVE vocal cords and not ejected out Pharyngeal - Solids Pharyngeal - Puree: Pharyngeal residue - valleculae;Reduced  tongue base retraction Pharyngeal - Regular: Pharyngeal residue - valleculae;Reduced  tongue base retraction;Pharyngeal residue - pyriform  sinuses;Reduced laryngeal elevation  Cervical Esophageal Phase    GO    Cervical Esophageal Phase Cervical Esophageal Phase: [facility]         Darrow Bussing.Ed CCC-SLP Pager [phone]  01/16/2013    Scheduled Meds: . clopidogrel  75 mg Oral QHS  . donepezil  5 mg Oral QHS  . fesoterodine  8 mg Oral Daily  . fluticasone  2 spray Each Nare Daily  . influenza vac split quadrivalent PF  0.5 mL Intramuscular Tomorrow-1000  . levETIRAcetam  750 mg Oral BID  . loratadine  10 mg Oral Daily  . phenobarbital  97.2 mg Oral QHS  . pneumococcal 23 valent vaccine  0.5 mL Intramuscular Tomorrow-1000  . sodium chloride  3 mL Intravenous Q12H   Continuous Infusions:   Principal Problem:   Difficulty walking Active Problems:   CVA (cerebral infarction)   Seizures   OSA (obstructive sleep apnea)   Glioma of brain   Rhabdomyolysis   [facility]  [facility] Pager [phone]. If 7PM-7AM, please contact night-coverage at [email or web address], password [facility] 01/18/2013, 8:09 AM  LOS: 4 days

## 2013-01-19 DIAGNOSIS — C719 Malignant neoplasm of brain, unspecified: Secondary | ICD-10-CM

## 2013-01-19 DIAGNOSIS — R5381 Other malaise: Secondary | ICD-10-CM

## 2013-01-19 DIAGNOSIS — M6282 Rhabdomyolysis: Secondary | ICD-10-CM

## 2013-01-19 MED ORDER — HYDROCORTISONE 1 % EX CREA
TOPICAL_CREAM | Freq: Three times a day (TID) | CUTANEOUS | Status: DC | PRN
  Administered 2013-01-20: via TOPICAL
  Filled 2013-01-19 (×2): qty 28

## 2013-01-19 MED ORDER — IBUPROFEN 600 MG PO TABS
600.0000 mg | ORAL_TABLET | Freq: Three times a day (TID) | ORAL | Status: DC | PRN
  Administered 2013-01-19: 600 mg via ORAL
  Filled 2013-01-19 (×3): qty 1

## 2013-01-19 NOTE — Progress Notes (Signed)
Speech Language Pathology Treatment: Dysphagia;Cognitive-Linquistic  Patient Details Name: KAIDIN BOEHLE MRN: [id number] DOB: 05-Jun-1945 Today's Date: 01/19/2013 Time: 4098-1191 SLP Time Calculation (min): 15 min  Assessment / Plan / Recommendation Clinical Impression  Pt. Required moderate verbal and visual cue for consistent use of chin tuck and double swallows with all trials.  No evidence of penetration or aspiration.  Documentation of lung sounds has not changed.  Recommend continue Dys 3 texture with nectar thick, chin tuck and double swallow.  Functional anticipatory awareness demonstrated by requesting his walker when asked what help he needed to transit to the bathroom.  Mod-max verbal and tactile cues for basic problem solving and sequencing with use of walker (sequence of sit to stand, hand placement) and mod verbal prompts to initiate movement.  SLP will continue to see for cognition and dysphagia treatment.      HPI HPI: Samuell Knoble Schleyer is a 67 y.o. male with a history of brain tumor status post radiation in 1982 who has been having difficulty with falls over the past year. He had a stroke in April of this year, and had difficulty following that but had improved to the point that he was able to walk without a cane. Over the past couple weeks, however, he has had worsening of his gait and then he had a fall down some stairs and was unable to get up from the bottom. On the morning of 01/15/13, pt was found to have altered mentation and per RN has seemed to have difficulty managing his secretions since that time.     Pertinent Vitals No changes  SLP Plan  Continue with current plan of care    Recommendations Diet recommendations: Dysphagia 3 (mechanical soft);Nectar-thick liquid Liquids provided via: Cup;No straw Medication Administration: Whole meds with puree Supervision: Patient able to self feed;Full supervision/cueing for compensatory strategies Compensations: Slow rate;Small  sips/bites;Multiple dry swallows after each bite/sip Postural Changes and/or Swallow Maneuvers: Seated upright 90 degrees;Chin tuck              General recommendations: Rehab consult Oral Care Recommendations: Oral care BID Follow up Recommendations: Inpatient Rehab Plan: Continue with current plan of care    GO     Royce Macadamia M.Ed [employer] [phone] 01/19/2013

## 2013-01-19 NOTE — Progress Notes (Signed)
Patient sleeping with his sister at bedside. I contacted his wife by phone to discuss inpt rehab venue as an option to his recovery. She is in agreement. I will begin insurance authorization for a possible admission to inpt rehab once insurance has approved. [phone]

## 2013-01-19 NOTE — Consult Note (Signed)
Physical Medicine and Rehabilitation Consult Reason for Consult: Question CVA Referring Physician: [facility]   HPI: Rodney Potts is a 67 y.o. 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 Patient ambulated with a cane prior to admission. Admitted 01/15/2013 with diffuse generalized weakness and falls. Patient was found down by his family after extended time. Cranial CT scan x2 negative. MRI of the brain 01/16/2013 showing stable changes of right posterior frontal and anterior temporal encephalomalacia remote a small right thalamic and left basal ganglia lacunar infarcts and extensive periventricular subcortical nonspecific white matter hyperintensities related to small vessel disease. Overall no significant changes compared to prior MRI scan 06/08/2012. MRI cervical thoracic spine unremarkable. No further workup indicated as per neurology services. Maintained on a dysphagia 3 nectar thick liquid diet. Physical therapy evaluation completed and ongoing recommendations for physical medicine rehabilitation consult to consider inpatient rehabilitation services. Review of Systems  Genitourinary: Positive for urgency.  Musculoskeletal: Positive for falls and myalgias.  Neurological: Positive for seizures.  Psychiatric/Behavioral: Positive for memory loss.  All other systems reviewed and are negative.   Past Medical History  Diagnosis Date  . Brain tumor     glioma; S/P radiation and chemotherapy (01/14/2013)  . TIA (transient ischemic attack) ~ 2011  . IBS (irritable bowel syndrome)   . Shingles   . CVA (cerebral infarction)     R thalamic  . Allergy   . Complication of anesthesia     "after knee OR woke up very wild; pulling things out, etc" (01/14/2013)  . High cholesterol   . Sleep apnea     "corrected w/OR" (01/14/2013)  . Seizures     "hx from the scar tissue where brain tumor was; on  RX to control" (01/14/2013)  . Stroke     "he's had a couple; still a little weaker on the left leg, drags that foot" (01/14/2013)  . Prostate cancer    Past Surgical History  Procedure Laterality Date  . Cataract extraction w/ intraocular lens implant Bilateral 2000's    "don't know if they put lens in" (01/14/2013)  . Joint replacement    . Prostatectomy  1990's  . Uvulopalatopharyngoplasty  1980's    "for sleep apnea" (01/14/2013)  . Shoulder arthroscopy Right 2000's  . Total knee arthroplasty Bilateral 2000's   Family History  Problem Relation Age of Onset  . Hypertension Father   . Transient ischemic attack Mother   . Cancer Mother    Social History:  reports that he has quit smoking. His smoking use included Cigarettes. He has a .48 pack-year smoking history. He has never used smokeless tobacco. He reports that he drinks about 4.2 ounces of alcohol per week. He reports that he does not use illicit drugs. Allergies:  Allergies  Allergen Reactions  . Amoxicillin Diarrhea  . Dilaudid [Hydromorphone Hcl] Other (See Comments)    DELUSIONS  . Lactose Intolerance (Gi) Diarrhea  . Sulfa Antibiotics Other (See Comments)    Cant control bladder   Medications Prior to Admission  Medication Sig Dispense Refill  . atorvastatin (LIPITOR) 40 MG tablet Take 40 mg by mouth at bedtime.       . cetirizine (ZYRTEC) 10 MG tablet Take 10 mg by mouth daily.      . clopidogrel (PLAVIX) 75 MG tablet Take 75 mg by mouth at bedtime.       . donepezil (ARICEPT) 5 MG tablet Take  1 tablet (5 mg total) by mouth at bedtime.  90 tablet  3  . fesoterodine (TOVIAZ) 8 MG TB24 Take 8 mg by mouth daily.      . fluticasone (FLONASE) 50 MCG/ACT nasal spray Place 2 sprays into both nostrils daily.       Marland Kitchen levETIRAcetam (KEPPRA) 500 MG tablet Take 500-1,000 mg by mouth every 12 (twelve) hours. Take 500mg  in the am & 1000mg  in the pm      . PHENobarbital (LUMINAL) 97.2 MG tablet Take 1 tablet (97.2 mg total) by  mouth at bedtime.  90 tablet  3    Home: Home Living Family/patient expects to be discharged to:: Private residence Living Arrangements: Spouse/significant other (First stated he lived alone then reports he lives with wife) Available Help at Discharge: Family;Available PRN/intermittently Type of Home: House Home Access: Stairs to enter [employer] of Steps: 7-14 (pt not the best historian) Entrance Stairs-Rails:  (pt reports Rt, chart review says Lt.) Home Layout: Two level;Able to live on main level with bedroom/bathroom Alternate Level Stairs-Number of Steps: 15 Alternate Level Stairs-Rails: Left Home Equipment: Cane - single point;Walker - 2 wheels;Bedside commode;Shower seat Additional Comments: Wife works during day.  Lives With: Spouse  Functional History: Prior Function Vocation: Unemployed Comments: Pt reports he was still ambulatory but having more falls than normal. A few a week Functional Status:  Mobility: Bed Mobility Bed Mobility: Rolling Right;Right Sidelying to Sit Rolling Right: 3: Mod assist Rolling Left: 2: Max assist Right Sidelying to Sit: 2: Max assist Left Sidelying to Sit: 1: +1 Total assist Transfers Transfers: Sit to Stand;Stand to Sit;Stand Pivot Transfers Sit to Stand: 1: +2 Total assist Sit to Stand: Patient Percentage: 60% Stand to Sit: 1: +2 Total assist Stand Pivot Transfers: 1: +2 Total assist Ambulation/Gait Ambulation/Gait Assistance:  (unable to attempt today)    ADL:    Cognition: Cognition Overall Cognitive Status: Impaired/Different from baseline (unclear what baseline is) Arousal/Alertness: Awake/alert Orientation Level: Oriented to person;Oriented to time;Other (comment) (confused at times per report) Attention: Sustained (likely deficits with higer level attention) Sustained Attention: Appears intact Memory: Impaired Memory Impairment: Storage deficit;Retrieval deficit;Decreased recall of new information;Decreased  short term memory;Prospective memory (uncertain of baseline memory) Decreased Short Term Memory: Verbal basic Awareness: Impaired Awareness Impairment: Emergent impairment;Anticipatory impairment Problem Solving: Impaired Problem Solving Impairment: Verbal basic Executive Function: Organizing;Self Monitoring;Self Correcting;Sequencing Safety/Judgment: Impaired Cognition Arousal/Alertness: Lethargic (much less leathargic than evaluation) Behavior During Therapy: WFL for tasks assessed/performed Overall Cognitive Status: Impaired/Different from baseline (unclear what baseline is) Area of Impairment: Orientation;Attention;Following commands;Awareness;Problem solving;Memory Orientation Level: Disoriented to;Time Current Attention Level: Focused;Sustained Memory: Decreased short-term memory Following Commands: Follows one step commands inconsistently (improved, following commands ~70% of time) Problem Solving: Slow processing;Decreased initiation;Difficulty sequencing;Requires verbal cues;Requires tactile cues General Comments: Pt demonstrating much improvement from yesterday but still with signfiicant deficits as compared to baseline per wife  Blood pressure 149/84, pulse 66, temperature 97.3 F (36.3 C), temperature source Oral, resp. rate 16, height 5\' 9"  (1.753 m), weight 80.06 kg (176 lb 8 oz), SpO2 97.00%. Physical Exam  Vitals reviewed. HENT:  Head: Normocephalic.  Eyes: EOM are normal.  Neck: Normal range of motion. Neck supple. No thyromegaly present.  Cardiovascular: Normal rate and regular rhythm.   Respiratory: Effort normal and breath sounds normal. No respiratory distress.  GI: Soft. Bowel sounds are normal. He exhibits no distension.  Neurological: He is alert.  Patient is pleasantly confused. He was able to provide his name age date  of birth. He needed multiple cues to name place with limited awareness of his deficits. He did follow simple commands. Moved all 4's but was  weak proximal to distal, 2-4/5. No gross sensory changes. Very distracted.   Skin: Skin is warm and dry.  Psychiatric: He has a normal mood and affect.    No results found for this or any previous visit (from the past 24 hour(s)). No results found.  Assessment/Plan: Diagnosis: rhabdomyolysis, deconditioning, hx of brain glioma, and CVA 1. Does the need for close, 24 hr/day medical supervision in concert with the patient's rehab needs make it unreasonable for this patient to be served in a less intensive setting? Yes 2. Co-Morbidities requiring supervision/potential complications: seizures, IBS 3. Due to bladder management, bowel management, safety, skin/wound care, disease management, medication administration, pain management and patient education, does the patient require 24 hr/day rehab nursing? Yes 4. Does the patient require coordinated care of a physician, rehab nurse, PT (1-2 hrs/day, 5 days/week), OT (1-2 hrs/day, 5 days/week) and SLP (1-2 hrs/day, 5 days/week) to address physical and functional deficits in the context of the above medical diagnosis(es)? Yes Addressing deficits in the following areas: balance, endurance, locomotion, strength, transferring, bowel/bladder control, bathing, dressing, feeding, grooming, toileting, cognition, speech, swallowing and psychosocial support 5. Can the patient actively participate in an intensive therapy program of at least 3 hrs of therapy per day at least 5 days per week? Yes 6. The potential for patient to make measurable gains while on inpatient rehab is excellent 7. Anticipated functional outcomes upon discharge from inpatient rehab are min to mod assist with PT, min to mod assist with OT, min assist with SLP. 8. Estimated rehab length of stay to reach the above functional goals is: 12-16 days 9. Does the patient have adequate social supports to accommodate these discharge functional goals? Potentially 10. Anticipated D/C setting:  Home 11. Anticipated post D/C treatments: HH therapy 12. Overall Rehab/Functional Prognosis: excellent  RECOMMENDATIONS: This patient's condition is appropriate for continued rehabilitative care in the following setting: CIR Patient has agreed to participate in recommended program. Potentially Note that insurance prior authorization may be required for reimbursement for recommended care.  Comment: Rehab RN to follow up.   Ranelle Oyster, MD, [place] [facility]     01/19/2013

## 2013-01-19 NOTE — Care Management Note (Signed)
    Page 1 of 1   01/19/2013     4:33:19 PM   CARE MANAGEMENT NOTE 01/19/2013  Patient:  Rodney Potts, CHAUCA   Account Number:  [id number]  Date Initiated:  01/19/2013  Documentation initiated by:  Donn Pierini  Subjective/Objective Assessment:   Pt admitted with falls- Mild rhabdomyolysis     Action/Plan:   PTA pt lived at home with spouse- PT/OT evals ordered for d/c planning  CIR consulted   Anticipated DC Date:  01/20/2013   Anticipated DC Plan:  IP REHAB FACILITY  In-house referral  Clinical Social Worker      DC Planning Services  CM consult      Choice offered to / List presented to:             Status of service:  In process, will continue to follow Medicare Important Message given?   (If response is "NO", the following Medicare IM given date fields will be blank) Date Medicare IM given:   Date Additional Medicare IM given:    Discharge Disposition:    Per UR Regulation:  Reviewed for med. necessity/level of care/duration of stay  If discussed at Long Length of Stay Meetings, dates discussed:    Comments:  01/19/13- 1630- Donn Pierini RN, BSN [phone] CIR consulted for possible admission - awaiting insurance auth. - CSW following for SNF backup.

## 2013-01-19 NOTE — Progress Notes (Signed)
[facility] PROGRESS NOTE  Rodney Potts ZOX:[id number] DOB: 1945/09/01 DOA: 01/14/2013 PCP: Pamelia Hoit, MD  Assessment/Plan: 1. Diffuse generalized weakness with difficulty with speech and LE weakness - Appreciate neurology following. Pt had barium swallow evaluation and now on Dys 3 diet and tolerating well. CT head neg for acute. MRI brain reviewed, no sig changes from study 4/14. MRI C spine/Tspine reviewed, no critical findings. CIR consult to be done today.   2. Rhabdomyolysis - Pt had been down on floor for some time before being found. CK level improving with IVF hydration. Saline lock IV as pt is tolerating p.o. better. 3. Fever - no recent fever reported, possibly related to rhabdo, CXR neg for infection, UA neg, blood cultures neg to date.  4. History of CVA - per neuro, continuing his antiplatelet agents 5. 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 HIstory of glioma - s/p chemoradiation treatment  Code Status: Full  Family Communication: Wife at bedside  Disposition Plan: rehab (hopefully CIR) when bed available   HPI/Subjective: Pt has a rash on back that gets itchy at times  Objective: Filed Vitals:   01/19/13 0519  BP: 149/84  Pulse: 66  Temp: 97.3 F (36.3 C)  Resp: 16    Intake/Output Summary (Last 24 hours) at 01/19/13 0751 Last data filed at 01/18/13 1900  Gross per 24 hour  Intake   2080 ml  Output      0 ml  Net   2080 ml   Filed Weights   01/14/13 2229  Weight: 176 lb 8 oz (80.06 kg)    Exam:  General: Well-developed well-nourished.  Eyes: Anicteric no pallor.  ENT: No discharge from ears eyes nose mouth.  Neck: No mass felt.  Cardiovascular: S1-S2 heard.  Respiratory: BBS clear, No rhonchi or crepitations.  Abdomen: Soft nontender bowel sounds present.  Skin: macular rash on back.  Musculoskeletal: No edema.  Psychiatric: Appears normal.  Neurologic: Alert awake  oriented to time place and person. Moves all extremities.    Data Reviewed: Basic Metabolic Panel:  Recent Labs Lab 01/14/13 1720 01/15/13 0405 01/16/13 0437 01/17/13 1000  NA 135 134* 133* 139  K 4.8 4.0 3.9 3.5  CL 96 98 99 103  CO2 24 23 20 26   GLUCOSE 83 78 88 121*  BUN 17 16 17 16   CREATININE 0.89 0.87 0.83 0.80  CALCIUM 9.8 8.7 7.5* 8.4   Liver Function Tests:  Recent Labs Lab 01/14/13 1720 01/15/13 0405 01/16/13 0437  AST 81* 94* 77*  ALT 25 25 24   ALKPHOS 71 55 41  BILITOT 0.3 0.3 0.2*  PROT 8.5* 7.0 5.7*  ALBUMIN 4.7 3.8 2.9*   No results found for this basename: LIPASE, AMYLASE,  in the last 168 hours No results found for this basename: AMMONIA,  in the last 168 hours CBC:  Recent Labs Lab 01/15/13 0134 01/15/13 0405 01/16/13 0437  WBC 6.2 6.6 7.7  NEUTROABS 4.0  --  5.5  HGB 11.6* 12.3* 11.0*  HCT 35.6* 37.3* 32.7*  MCV 91.8 91.9 90.1  PLT 195 193 170   Cardiac Enzymes:  Recent Labs Lab 01/14/13 1720 01/14/13 1956 01/15/13 0405 01/16/13 0437 01/17/13 1000 01/18/13 0532  CKTOTAL  --  3062* 4081* 2544* 2466* 1474*  TROPONINI <0.30  --  <0.30  --   --   --    BNP (last 3 results) No results found for this basename: PROBNP,  in  the last 8760 hours CBG:  Recent Labs Lab 01/14/13 1745  GLUCAP 91    Recent Results (from the past 240 hour(s))  CULTURE, BLOOD (ROUTINE X 2)     Status: None   Collection Time    01/15/13 12:45 PM      Result Value Range Status   Specimen Description BLOOD LEFT HAND   Final   Special Requests BOTTLES DRAWN AEROBIC ONLY 1.5CC   Final   Culture  Setup Time     Final   Value: 01/15/2013 17:39     Performed at [facility]   Culture     Final   Value:        BLOOD CULTURE RECEIVED NO GROWTH TO DATE CULTURE WILL BE HELD FOR 5 DAYS BEFORE ISSUING A FINAL NEGATIVE REPORT     Performed at [facility]   Report Status PENDING   Incomplete  CULTURE, BLOOD (ROUTINE X 2)     Status: None    Collection Time    01/15/13 12:52 PM      Result Value Range Status   Specimen Description BLOOD LEFT FINGER   Final   Special Requests BOTTLES DRAWN AEROBIC ONLY 2CC   Final   Culture  Setup Time     Final   Value: 01/15/2013 17:39     Performed at [facility]   Culture     Final   Value:        BLOOD CULTURE RECEIVED NO GROWTH TO DATE CULTURE WILL BE HELD FOR 5 DAYS BEFORE ISSUING A FINAL NEGATIVE REPORT     Performed at [facility]   Report Status PENDING   Incomplete     Studies: No results found.  Scheduled Meds: . clopidogrel  75 mg Oral QHS  . donepezil  5 mg Oral QHS  . fesoterodine  8 mg Oral Daily  . fluticasone  2 spray Each Nare Daily  . influenza vac split quadrivalent PF  0.5 mL Intramuscular Tomorrow-1000  . levETIRAcetam  750 mg Oral BID  . loratadine  10 mg Oral Daily  . phenobarbital  97.2 mg Oral QHS  . pneumococcal 23 valent vaccine  0.5 mL Intramuscular Tomorrow-1000  . sodium chloride  3 mL Intravenous Q12H   Continuous Infusions:   Principal Problem:   Difficulty walking Active Problems:   CVA (cerebral infarction)   Seizures   OSA (obstructive sleep apnea)   Glioma of brain   Rhabdomyolysis   [name] [facility]  [facility] Pager [phone]. If 7PM-7AM, please contact night-coverage at [email or web address], password [email or web address] 01/19/2013, 7:51 AM  LOS: 5 days

## 2013-01-19 NOTE — Progress Notes (Signed)
Physical Therapy Treatment Patient Details Name: DAYLEN LIPSKY MRN: [id number] DOB: 02-Jul-1945 Today's Date: 01/19/2013 Time: 4098-1191 PT Time Calculation (min): 25 min  PT Assessment / Plan / Recommendation  History of Present Illness Revis Whalin Knape is a 67 y.o. male with a history of brain tumor status post radiation in 1982 who has been having difficulty with falls over the past year. He had a stroke in April of this year, and had difficulty following that but had improved to the point that he was able to walk without a cane. Wife reports that even this weekend he was carrying wood into the house.Over the past couple weeks, however, he has had worsening of his gait and then had a fall down some stairs and was unable to get up from the bottom. Wife reports min cognitive deficits primarily affecting memory due to prior stroke. Pt working with PT 11/13 noted to have significant change in physical and cognitive status, rapid response called and pt being followed closely. CT scan showed no acute changes.   PT Comments   Pt much improved physically from previous sessions, now requiring only mod A for gait and transfers.  Pt continues with cognitive deficits, decreased orientation, attention and awareness.  Pt will benefit from continued rehab services at [facility] or SNF to decrease burden of care at d/c.  Follow Up Recommendations  CIR;SNF;Supervision/Assistance - 24 hour     Does the patient have the potential to tolerate intense rehabilitation     Barriers to Discharge        Equipment Recommendations  Rolling walker with 5" wheels    Recommendations for Other Services Rehab consult  Frequency Min 4X/week   Progress towards PT Goals Progress towards PT goals: Goals met and updated - see care plan  Plan Current plan remains appropriate    Precautions / Restrictions Precautions Precautions: Fall Restrictions Weight Bearing Restrictions: No   Pertinent Vitals/Pain Pt c/o pain in back, RN  made aware    Mobility  Bed Mobility Rolling Left: 4: Min assist Left Sidelying to Sit: 4: Min assist Details for Bed Mobility Assistance: pt requires cues for sequencing, assist for LEs but able to lift trunk using rail Transfers Sit to Stand: 4: Min assist Stand to Sit: 4: Min assist Details for Transfer Assistance: pt requires increased time and verbal cues for safety and sequencing but able to perform with less physical assist today Ambulation/Gait Ambulation/Gait Assistance: 3: Mod assist Ambulation Distance (Feet): 20 Feet Assistive device: Rolling walker Ambulation/Gait Assistance Details: 20' x 2 with RW with mod A for RW control and safety as well as balance with obstacle negotaition and turns.  Pt with decreased safety awareness requiring cues to keep RW on floor when turning.  Pt with forward flexed posture requiring manual facilitaiton for upright trunki.  Shuffling gait, small step length    Exercises     PT Diagnosis:    PT Problem List:   PT Treatment Interventions:     PT Goals (current goals can now be found in the care plan section)    Visit Information  Last PT Received On: 01/19/13 Assistance Needed: +1 History of Present Illness: Demetrios Byron Scaife is a 67 y.o. male with a history of brain tumor status post radiation in 1982 who has been having difficulty with falls over the past year. He had a stroke in April of this year, and had difficulty following that but had improved to the point that he was able to walk  without a cane. Wife reports that even this weekend he was carrying wood into the house.Over the past couple weeks, however, he has had worsening of his gait and then had a fall down some stairs and was unable to get up from the bottom. Wife reports min cognitive deficits primarily affecting memory due to prior stroke. Pt working with PT 11/13 noted to have significant change in physical and cognitive status, rapid response called and pt being followed closely.  CT scan showed no acute changes.    Subjective Data      Cognition  Cognition Arousal/Alertness: Awake/alert Behavior During Therapy: Flat affect Overall Cognitive Status: No family/caregiver present to determine baseline cognitive functioning Area of Impairment: Orientation;Attention;Safety/judgement;Awareness Orientation Level: Disoriented to;Place;Time;Situation Current Attention Level: Sustained Safety/Judgement: Decreased awareness of safety Awareness: Intellectual Problem Solving: Slow processing;Decreased initiation;Difficulty sequencing;Requires verbal cues;Requires tactile cues    Balance  Static Sitting Balance Static Sitting - Level of Assistance: 5: Stand by assistance Dynamic Sitting Balance Dynamic Sitting - Level of Assistance: 4: Min assist Dynamic Sitting - Balance Activities: Reaching for objects  End of Session PT - End of Session Equipment Utilized During Treatment: Gait belt Activity Tolerance: Patient tolerated treatment well Patient left: in bed;with call bell/phone within reach;with bed alarm set Nurse Communication: Mobility status;Patient requests pain meds   GP     Langston [place] 01/19/2013, 9:56 AM

## 2013-01-19 NOTE — Evaluation (Signed)
Occupational Therapy Evaluation Patient Details Name: Rodney Potts MRN: [id number] DOB: [birth year] Today's Date: 01/19/2013 Time: 0865-7846 OT Time Calculation (min): 42 min  OT Assessment / Plan / Recommendation History of present illness Rodney Potts is a 67 y.o. male with a history of brain tumor status post radiation in 1982 who has been having difficulty with falls over the past year. He had a stroke in April of this year, and had difficulty following that but had improved to the point that he was able to walk without a cane. Wife reports that even this weekend he was carrying wood into the house.Over the past couple weeks, however, he has had worsening of his gait and then had a fall down some stairs and was unable to get up from the bottom. Wife reports min cognitive deficits primarily affecting memory due to prior stroke. Pt working with PT 11/13 noted to have significant change in physical and cognitive status, rapid response called and pt being followed closely. CT scan showed no acute changes.   Clinical Impression   Pt admitted for the above diagnosis and has the deficits listed below. Pt was previously fairly independent although experiencing more falls recently.  Pt would benefit from cont OT to increase I with basic adls back to S to Mod I so he can d/c back home with his wife.  Feel pt may need 24/7 S to start after d/c from rehab.    OT Assessment  Patient needs continued OT Services    Follow Up Recommendations  CIR;Supervision/Assistance - 24 hour    Barriers to Discharge Decreased caregiver support states wife works during the day.  Unsure if he could have 24/7 S.  Equipment Recommendations  None recommended by OT    Recommendations for Other Services Rehab consult  Frequency  Min 3X/week    Precautions / Restrictions Precautions Precautions: Fall Precaution Comments:  h/o falls Restrictions Weight Bearing Restrictions: No   Pertinent Vitals/Pain Pt c/o pain  in L foot due to bunion.     ADL  Eating/Feeding: Performed;Set up;Minimal assistance Where Assessed - Eating/Feeding: Chair Grooming: Performed;Teeth care;Minimal assistance Where Assessed - Grooming: Supported sitting Upper Body Bathing: Simulated;Minimal assistance Where Assessed - Upper Body Bathing: Supported sitting Lower Body Bathing: Performed;Moderate assistance Where Assessed - Lower Body Bathing: Supported sit to stand Upper Body Dressing: Performed;Minimal assistance Where Assessed - Upper Body Dressing: Supported sitting Lower Body Dressing: Performed;Moderate assistance Where Assessed - Lower Body Dressing: Supported sit to Pharmacist, hospital: Performed;Moderate assistance Toilet Transfer Method: Stand pivot Toilet Transfer Equipment: Bedside commode Toileting - Clothing Manipulation and Hygiene: Performed;Moderate assistance Where Assessed - Toileting Clothing Manipulation and Hygiene: Standing Equipment Used: Rolling walker Transfers/Ambulation Related to ADLs: Pt unsteady with transfers and had difficulty with weight shifting from R to L to pivot. ADL Comments: Pt needs more assist with LE adls due to decreased balance in standing.      OT Diagnosis: Generalized weakness;Cognitive deficits;Acute pain  OT Problem List: Decreased strength;Impaired balance (sitting and/or standing);Decreased coordination;Decreased cognition;Decreased safety awareness;Decreased knowledge of use of DME or AE;Impaired UE functional use;Pain OT Treatment Interventions: Self-care/ADL training;DME and/or AE instruction;Therapeutic activities   OT Goals(Current goals can be found in the care plan section) Acute Rehab OT Goals Patient Stated Goal: Go home OT Goal Formulation: With patient Time For Goal Achievement: 02/02/13 Potential to Achieve Goals: Good ADL Goals Pt Will Perform Eating: Independently;sitting Pt Will Perform Grooming: with supervision;standing Pt Will Perform Lower  Body Bathing: with min  guard assist;sit to/from stand Pt Will Perform Lower Body Dressing: with min guard assist;sit to/from stand Pt Will Perform Tub/Shower Transfer: Shower transfer;with min guard assist;ambulating;shower seat;rolling walker Additional ADL Goal #1: Pt will complete toileting with 3:1 over commode with min guard.  Visit Information  Last OT Received On: 01/19/13 Assistance Needed: +1 History of Present Illness: Rodney Potts is a 67 y.o. male with a history of brain tumor status post radiation in 1982 who has been having difficulty with falls over the past year. He had a stroke in April of this year, and had difficulty following that but had improved to the point that he was able to walk without a cane. Wife reports that even this weekend he was carrying wood into the house.Over the past couple weeks, however, he has had worsening of his gait and then had a fall down some stairs and was unable to get up from the bottom. Wife reports min cognitive deficits primarily affecting memory due to prior stroke. Pt working with PT 11/13 noted to have significant change in physical and cognitive status, rapid response called and pt being followed closely. CT scan showed no acute changes.       Prior Functioning     Home Living Family/patient expects to be discharged to:: Private residence Living Arrangements: Spouse/significant other Available Help at Discharge: Family;Available PRN/intermittently Type of Home: House Home Access: Stairs to enter Entrance Stairs-Rails: Left Home Layout: Two level;Able to live on main level with bedroom/bathroom Alternate Level Stairs-Number of Steps: 15 Alternate Level Stairs-Rails: Left Home Equipment: Cane - single point;Walker - 2 wheels;Bedside commode;Shower seat Additional Comments: Wife works during day.  Lives With: Spouse Prior Function Level of Independence: Independent with assistive device(s) Comments: Pt reports he was still  ambulatory but having more falls than normal. A few a week Communication Communication: Receptive difficulties;Expressive difficulties Dominant Hand: Right         Vision/Perception Vision - History Baseline Vision: Wears glasses all the time Visual History: Cataracts Patient Visual Report: No change from baseline Vision - Assessment Eye Alignment: Within Functional Limits Vision Assessment: Vision tested Ocular Range of Motion: Within Functional Limits Alignment/Gaze Preference: Within Defined Limits Tracking/Visual Pursuits: Able to track stimulus in all quads without difficulty Visual Fields: No apparent deficits Perception Perception: Within Functional Limits Praxis Praxis: Intact   Cognition  Cognition Arousal/Alertness: Awake/alert Behavior During Therapy: Flat affect Overall Cognitive Status: No family/caregiver present to determine baseline cognitive functioning Area of Impairment: Attention;Memory;Safety/judgement;Awareness;Problem solving Orientation Level: Disoriented to;Place;Time;Situation Current Attention Level: Selective Memory: Decreased short-term memory Following Commands: Follows one step commands consistently Safety/Judgement: Decreased awareness of safety Awareness: Emergent Problem Solving: Slow processing;Decreased initiation;Difficulty sequencing;Requires verbal cues;Requires tactile cues    Extremity/Trunk Assessment Upper Extremity Assessment Upper Extremity Assessment: Generalized weakness;LUE deficits/detail LUE Deficits / Details: Pt tests with functional strength throughout.  Pt with numbness in the L side that affects his coordination. LUE Sensation: decreased light touch LUE Coordination: decreased fine motor Lower Extremity Assessment Lower Extremity Assessment: Defer to PT evaluation     Mobility Bed Mobility Bed Mobility: Rolling Right;Right Sidelying to Sit Rolling Right: 3: Mod assist Rolling Left: 4: Min assist Right  Sidelying to Sit: 3: Mod assist Left Sidelying to Sit: 4: Min assist Details for Bed Mobility Assistance: pt requires cues for sequencing, assist for LEs but able to lift trunk using rail Transfers Transfers: Sit to Stand;Stand to Sit Sit to Stand: 3: Mod assist Stand to Sit: 4: Min assist Details for Transfer  Assistance: pt requires increased time and verbal cues for safety and sequencing but able to perform with less physical assist today     Exercise     Balance Static Sitting Balance Static Sitting - Level of Assistance: 5: Stand by assistance Dynamic Sitting Balance Dynamic Sitting - Level of Assistance: 4: Min assist Dynamic Sitting - Balance Activities: Reaching for objects   End of Session OT - End of Session Equipment Utilized During Treatment: Rolling walker Activity Tolerance: Patient tolerated treatment well Patient left: in chair;with call bell/phone within reach;with chair alarm set Nurse Communication: Mobility status  GO     Hope Budds 01/19/2013, 12:25 PM [phone]

## 2013-01-20 ENCOUNTER — Inpatient Hospital Stay (HOSPITAL_COMMUNITY)
Admission: RE | Admit: 2013-01-20 | Discharge: 2013-01-28 | DRG: 945 | Disposition: A | Discharge status: Home / Self Care | Payer: Medicare Other | Source: Intra-hospital | Attending: Physical Medicine & Rehabilitation | Admitting: Physical Medicine & Rehabilitation

## 2013-01-20 DIAGNOSIS — R5381 Other malaise: Secondary | ICD-10-CM

## 2013-01-20 DIAGNOSIS — Z96659 Presence of unspecified artificial knee joint: Secondary | ICD-10-CM | POA: Diagnosis not present

## 2013-01-20 DIAGNOSIS — Z87891 Personal history of nicotine dependence: Secondary | ICD-10-CM | POA: Diagnosis not present

## 2013-01-20 DIAGNOSIS — Z923 Personal history of irradiation: Secondary | ICD-10-CM

## 2013-01-20 DIAGNOSIS — R269 Unspecified abnormalities of gait and mobility: Secondary | ICD-10-CM

## 2013-01-20 DIAGNOSIS — Z9221 Personal history of antineoplastic chemotherapy: Secondary | ICD-10-CM

## 2013-01-20 DIAGNOSIS — M109 Gout, unspecified: Secondary | ICD-10-CM

## 2013-01-20 DIAGNOSIS — Z85841 Personal history of malignant neoplasm of brain: Secondary | ICD-10-CM | POA: Diagnosis not present

## 2013-01-20 DIAGNOSIS — R1310 Dysphagia, unspecified: Secondary | ICD-10-CM | POA: Diagnosis not present

## 2013-01-20 DIAGNOSIS — Z5189 Encounter for other specified aftercare: Principal | ICD-10-CM

## 2013-01-20 DIAGNOSIS — R32 Unspecified urinary incontinence: Secondary | ICD-10-CM | POA: Diagnosis not present

## 2013-01-20 DIAGNOSIS — G40909 Epilepsy, unspecified, not intractable, without status epilepticus: Secondary | ICD-10-CM

## 2013-01-20 DIAGNOSIS — Z7902 Long term (current) use of antithrombotics/antiplatelets: Secondary | ICD-10-CM

## 2013-01-20 DIAGNOSIS — M6282 Rhabdomyolysis: Secondary | ICD-10-CM

## 2013-01-20 DIAGNOSIS — Z8546 Personal history of malignant neoplasm of prostate: Secondary | ICD-10-CM | POA: Diagnosis not present

## 2013-01-20 DIAGNOSIS — N318 Other neuromuscular dysfunction of bladder: Secondary | ICD-10-CM

## 2013-01-20 DIAGNOSIS — F0390 Unspecified dementia without behavioral disturbance: Secondary | ICD-10-CM | POA: Diagnosis not present

## 2013-01-20 DIAGNOSIS — E785 Hyperlipidemia, unspecified: Secondary | ICD-10-CM

## 2013-01-20 DIAGNOSIS — G8190 Hemiplegia, unspecified affecting unspecified side: Secondary | ICD-10-CM

## 2013-01-20 DIAGNOSIS — Z8673 Personal history of transient ischemic attack (TIA), and cerebral infarction without residual deficits: Secondary | ICD-10-CM | POA: Diagnosis not present

## 2013-01-20 DIAGNOSIS — Z79899 Other long term (current) drug therapy: Secondary | ICD-10-CM | POA: Diagnosis not present

## 2013-01-20 DIAGNOSIS — I6789 Other cerebrovascular disease: Secondary | ICD-10-CM

## 2013-01-20 DIAGNOSIS — Z9181 History of falling: Secondary | ICD-10-CM

## 2013-01-20 MED ORDER — FESOTERODINE FUMARATE ER 8 MG PO TB24
8.0000 mg | ORAL_TABLET | Freq: Every day | ORAL | Status: DC
  Administered 2013-01-21 – 2013-01-28 (×8): 8 mg via ORAL
  Filled 2013-01-20 (×10): qty 1

## 2013-01-20 MED ORDER — COLCHICINE 0.6 MG PO TABS
0.6000 mg | ORAL_TABLET | Freq: Two times a day (BID) | ORAL | Status: DC
  Administered 2013-01-20: 0.6 mg via ORAL
  Filled 2013-01-20 (×5): qty 1

## 2013-01-20 MED ORDER — PREDNISONE 20 MG PO TABS
20.0000 mg | ORAL_TABLET | Freq: Two times a day (BID) | ORAL | Status: DC
  Administered 2013-01-20 – 2013-01-21 (×3): 20 mg via ORAL
  Filled 2013-01-20 (×7): qty 1

## 2013-01-20 MED ORDER — LORATADINE 10 MG PO TABS
10.0000 mg | ORAL_TABLET | Freq: Every day | ORAL | Status: DC
  Administered 2013-01-21 – 2013-01-28 (×8): 10 mg via ORAL
  Filled 2013-01-20 (×10): qty 1

## 2013-01-20 MED ORDER — PHENOBARBITAL 97.2 MG PO TABS
97.2000 mg | ORAL_TABLET | Freq: Every day | ORAL | Status: DC
  Administered 2013-01-20 – 2013-01-27 (×8): 97.2 mg via ORAL
  Filled 2013-01-20 (×8): qty 1

## 2013-01-20 MED ORDER — RESOURCE THICKENUP CLEAR PO POWD
ORAL | Status: DC | PRN
  Filled 2013-01-20: qty 125

## 2013-01-20 MED ORDER — ACETAMINOPHEN 650 MG RE SUPP: 650.0000 mg | Freq: Four times a day (QID) | RECTAL | Status: DC | PRN

## 2013-01-20 MED ORDER — ATORVASTATIN CALCIUM 40 MG PO TABS
40.0000 mg | ORAL_TABLET | Freq: Every day | ORAL | Status: DC
  Administered 2013-01-20 – 2013-01-27 (×8): 40 mg via ORAL
  Filled 2013-01-20 (×10): qty 1

## 2013-01-20 MED ORDER — ACETAMINOPHEN 325 MG PO TABS
650.0000 mg | ORAL_TABLET | Freq: Four times a day (QID) | ORAL | Status: DC | PRN
  Administered 2013-01-21 – 2013-01-27 (×5): 650 mg via ORAL
  Filled 2013-01-20 (×5): qty 2

## 2013-01-20 MED ORDER — SORBITOL 70 % SOLN: 30.0000 mL | Freq: Every day | Status: DC | PRN

## 2013-01-20 MED ORDER — HYDROCORTISONE 1 % EX CREA
TOPICAL_CREAM | Freq: Three times a day (TID) | CUTANEOUS | Status: DC | PRN
  Administered 2013-01-24 – 2013-01-26 (×2): via TOPICAL
  Filled 2013-01-20: qty 28

## 2013-01-20 MED ORDER — ONDANSETRON HCL 4 MG/2ML IJ SOLN: 4.0000 mg | Freq: Four times a day (QID) | INTRAMUSCULAR | Status: DC | PRN

## 2013-01-20 MED ORDER — LEVETIRACETAM 750 MG PO TABS
750.0000 mg | ORAL_TABLET | Freq: Two times a day (BID) | ORAL | Status: DC
  Administered 2013-01-20 – 2013-01-28 (×16): 750 mg via ORAL
  Filled 2013-01-20 (×19): qty 1

## 2013-01-20 MED ORDER — FLUTICASONE PROPIONATE 50 MCG/ACT NA SUSP
2.0000 | Freq: Every day | NASAL | Status: DC
  Administered 2013-01-21 – 2013-01-28 (×8): 2 via NASAL
  Filled 2013-01-20: qty 16

## 2013-01-20 MED ORDER — DONEPEZIL HCL 5 MG PO TABS
5.0000 mg | ORAL_TABLET | Freq: Every day | ORAL | Status: DC
  Administered 2013-01-20 – 2013-01-27 (×8): 5 mg via ORAL
  Filled 2013-01-20 (×9): qty 1

## 2013-01-20 MED ORDER — CLOPIDOGREL BISULFATE 75 MG PO TABS
75.0000 mg | ORAL_TABLET | Freq: Every day | ORAL | Status: DC
  Administered 2013-01-20 – 2013-01-27 (×8): 75 mg via ORAL
  Filled 2013-01-20 (×9): qty 1

## 2013-01-20 MED ORDER — LEVETIRACETAM 750 MG PO TABS: 750.0000 mg | ORAL_TABLET | Freq: Two times a day (BID) | ORAL | Status: DC

## 2013-01-20 MED ORDER — ONDANSETRON HCL 4 MG PO TABS: 4.0000 mg | ORAL_TABLET | Freq: Four times a day (QID) | ORAL | Status: DC | PRN

## 2013-01-20 NOTE — Progress Notes (Signed)
Physical Medicine and Rehabilitation Admission H&P  Chief Complaint   Patient presents with   .  Weakness   :  Chief complaint: Diffuse weakness  HPI: Rodney Potts is a 67 y.o. 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 Patient ambulated with a cane prior to admission. Admitted 01/15/2013 with diffuse generalized weakness and falls. Patient was found down by his family after extended time.  Cranial CT scan x2 negative. MRI of the brain 01/16/2013 showing stable changes of right posterior frontal and anterior temporal encephalomalacia remote a small right thalamic and left basal ganglia lacunar infarcts and extensive periventricular subcortical nonspecific white matter hyperintensities related to small vessel disease. Overall no significant changes compared to prior MRI scan 06/08/2012.  MRI cervical thoracic spine unremarkable. No further workup indicated as per neurology services. Maintained on a dysphagia 3 nectar thick liquid diet. Physical and occupational therapy evaluations completed and ongoing recommendations for physical medicine rehabilitation consult to consider inpatient rehabilitation services. Patient was felt to be a good candidate for inpatient rehabilitation services and was admitted for a comprehensive rehabilitation program  ROS Review of Systems  Genitourinary: Positive for urgency.  Musculoskeletal: Positive for falls and myalgias. Right big toe pain  Neurological: Positive for seizures.  Psychiatric/Behavioral: Positive for memory loss.  All other systems reviewed and are negative  Past Medical History   Diagnosis  Date   .  Brain tumor      glioma; S/P radiation and chemotherapy (01/14/2013)   .  TIA (transient ischemic attack)  ~ 2011   .  IBS (irritable bowel syndrome)    .  Shingles    .  CVA (cerebral infarction)      R thalamic   .  Allergy    .   Complication of anesthesia      "after knee OR woke up very wild; pulling things out, etc" (01/14/2013)   .  High cholesterol    .  Sleep apnea      "corrected w/OR" (01/14/2013)   .  Seizures      "hx from the scar tissue where brain tumor was; on RX to control" (01/14/2013)   .  Stroke      "he's had a couple; still a little weaker on the left leg, drags that foot" (01/14/2013)   .  Prostate cancer     Past Surgical History   Procedure  Laterality  Date   .  Cataract extraction w/ intraocular lens implant  Bilateral  2000's     "don't know if they put lens in" (01/14/2013)   .  Joint replacement     .  Prostatectomy   1990's   .  Uvulopalatopharyngoplasty   1980's     "for sleep apnea" (01/14/2013)   .  Shoulder arthroscopy  Right  2000's   .  Total knee arthroplasty  Bilateral  2000's    Family History   Problem  Relation  Age of Onset   .  Hypertension  Father    .  Transient ischemic attack  Mother    .  Cancer  Mother    Social History: reports that he has quit smoking. His smoking use included Cigarettes. He has a .48 pack-year smoking history. He has never used smokeless tobacco. He reports that he drinks about 4.2 ounces of alcohol per week. He reports that he does not use illicit drugs.  Allergies:    Allergies   Allergen  Reactions   .  Amoxicillin  Diarrhea   .  Dilaudid [Hydromorphone Hcl]  Other (See Comments)     DELUSIONS   .  Lactose Intolerance (Gi)  Diarrhea   .  Sulfa Antibiotics  Other (See Comments)     Cant control bladder    Medications Prior to Admission   Medication  Sig  Dispense  Refill   .  atorvastatin (LIPITOR) 40 MG tablet  Take 40 mg by mouth at bedtime.     .  cetirizine (ZYRTEC) 10 MG tablet  Take 10 mg by mouth daily.     .  clopidogrel (PLAVIX) 75 MG tablet  Take 75 mg by mouth at bedtime.     .  donepezil (ARICEPT) 5 MG tablet  Take 1 tablet (5 mg total) by mouth at bedtime.  90 tablet  3   .  fesoterodine (TOVIAZ) 8 MG TB24  Take 8 mg  by mouth daily.     .  fluticasone (FLONASE) 50 MCG/ACT nasal spray  Place 2 sprays into both nostrils daily.     .  levETIRAcetam (KEPPRA) 500 MG tablet  Take 500-1,000 mg by mouth every 12 (twelve) hours. Take 500mg in the am & 1000mg in the pm     .  PHENobarbital (LUMINAL) 97.2 MG tablet  Take 1 tablet (97.2 mg total) by mouth at bedtime.  90 tablet  3   Home:  Home Living  Family/patient expects to be discharged to:: Private residence  Living Arrangements: Spouse/significant other  Available Help at Discharge: Family;Available PRN/intermittently  Type of Home: House  Home Access: Stairs to enter  Entrance Stairs-Number of Steps: 7-14 (pt not the best historian)  Entrance Stairs-Rails: Left  Home Layout: Two level;Able to live on main level with bedroom/bathroom  Alternate Level Stairs-Number of Steps: 15  Alternate Level Stairs-Rails: Left  Home Equipment: Cane - single point;Walker - 2 wheels;Bedside commode;Shower seat  Additional Comments: Wife works during day.  Lives With: Spouse  Functional History:  Prior Function  Vocation: Unemployed  Comments: Pt reports he was still ambulatory but having more falls than normal. A few a week  Functional Status:  Mobility:  Bed Mobility  Bed Mobility: Rolling Right;Right Sidelying to Sit  Rolling Right: 3: Mod assist  Rolling Left: 4: Min assist  Right Sidelying to Sit: 3: Mod assist  Left Sidelying to Sit: 4: Min assist  Transfers  Transfers: Sit to Stand;Stand to Sit;Stand Pivot Transfers  Sit to Stand: 3: Mod assist  Sit to Stand: Patient Percentage: 60%  Stand to Sit: 4: Min assist  Stand Pivot Transfers: 1: +2 Total assist  Ambulation/Gait  Ambulation/Gait Assistance: 3: Mod assist  Ambulation Distance (Feet): 20 Feet  Assistive device: Rolling walker  Ambulation/Gait Assistance Details: 20' x 2 with RW with mod A for RW control and safety as well as balance with obstacle negotaition and turns. Pt with decreased safety  awareness requiring cues to keep RW on floor when turning. Pt with forward flexed posture requiring manual facilitaiton for upright trunki. Shuffling gait, small step length  ADL:  ADL  Eating/Feeding: Performed;Set up;Minimal assistance  Where Assessed - Eating/Feeding: Chair  Grooming: Performed;Teeth care;Minimal assistance  Where Assessed - Grooming: Supported sitting  Upper Body Bathing: Simulated;Minimal assistance  Where Assessed - Upper Body Bathing: Supported sitting  Lower Body Bathing: Performed;Moderate assistance  Where Assessed - Lower Body Bathing: Supported sit to stand  Upper Body Dressing: Performed;Minimal assistance    Where Assessed - Upper Body Dressing: Supported sitting  Lower Body Dressing: Performed;Moderate assistance  Where Assessed - Lower Body Dressing: Supported sit to stand  Toilet Transfer: Performed;Moderate assistance  Toilet Transfer Method: Stand pivot  Toilet Transfer Equipment: Bedside commode  Equipment Used: Rolling walker  Transfers/Ambulation Related to ADLs: Pt unsteady with transfers and had difficulty with weight shifting from R to L to pivot.  ADL Comments: Pt needs more assist with LE adls due to decreased balance in standing.  Cognition:  Cognition  Overall Cognitive Status: No family/caregiver present to determine baseline cognitive functioning  Arousal/Alertness: Awake/alert  Orientation Level: Oriented X4  Attention: Sustained (likely deficits with higer level attention)  Sustained Attention: Appears intact  Memory: Impaired  Memory Impairment: Storage deficit;Retrieval deficit;Decreased recall of new information;Decreased short term memory;Prospective memory (uncertain of baseline memory)  Decreased Short Term Memory: Verbal basic  Awareness: Impaired  Awareness Impairment: Emergent impairment;Anticipatory impairment  Problem Solving: Impaired  Problem Solving Impairment: Verbal basic  Executive Function: Organizing;Self  Monitoring;Self Correcting;Sequencing  Safety/Judgment: Impaired  Cognition  Arousal/Alertness: Awake/alert  Behavior During Therapy: Flat affect  Overall Cognitive Status: No family/caregiver present to determine baseline cognitive functioning  Area of Impairment: Attention;Memory;Safety/judgement;Awareness;Problem solving  Orientation Level: Disoriented to;Place;Time;Situation  Current Attention Level: Selective  Memory: Decreased short-term memory  Following Commands: Follows one step commands consistently  Safety/Judgement: Decreased awareness of safety  Awareness: Emergent  Problem Solving: Slow processing;Decreased initiation;Difficulty sequencing;Requires verbal cues;Requires tactile cues  General Comments: Pt demonstrating much improvement from yesterday but still with signfiicant deficits as compared to baseline per wife  Physical Exam:  Blood pressure 128/80, pulse 68, temperature 99.4 F (37.4 C), temperature source Oral, resp. rate 18, height 5' 9" (1.753 m), weight 80.06 kg (176 lb 8 oz), SpO2 97.00%.  HENT:  Head: Normocephalic.  Eyes: EOM are normal.  Neck: Normal range of motion. Neck supple. No thyromegaly present.  Cardiovascular: Normal rate and regular rhythm.  Respiratory: Effort normal and breath sounds normal. No respiratory distress.  GI: Soft. Bowel sounds are normal. He exhibits no distension.  Neurological: He is alert.  Patient is pleasantly more oriented today. He was able to provide his name age date of birth. He did follow simple commands. Basic insight and awareness only. Strength 3/5 shoulder girdle to 4/5 at elbow and wrist. Hip flexors 3-, KE 3, ankles 3+ to 4/5 except where pain inhibits. No gross sensory changes.  Musc: right great toe/MCP red, warm, and tender  Skin: Skin is warm and dry.  Psychiatric: He has a normal mood and affect  No results found for this or any previous visit (from the past 48 hour(s)).  No results found.  Post Admission  Physician Evaluation:  1. Functional deficits secondary to rhabdomyolysis, deconditioning. Pt has a hx of brain glioma. 2. Patient is admitted to receive collaborative, interdisciplinary care between the physiatrist, rehab nursing staff, and therapy team. 3. Patient's level of medical complexity and substantial therapy needs in context of that medical necessity cannot be provided at a lesser intensity of care such as a SNF. 4. Patient has experienced substantial functional loss from his/her baseline which was documented above under the "Functional History" and "Functional Status" headings. Judging by the patient's diagnosis, physical exam, and functional history, the patient has potential for functional progress which will result in measurable gains while on inpatient rehab. These gains will be of substantial and practical use upon discharge in facilitating mobility and self-care at the household level. 5. Physiatrist will provide   24 hour management of medical needs as well as oversight of the therapy plan/treatment and provide guidance as appropriate regarding the interaction of the two. 6. 24 hour rehab nursing will assist with bladder management, bowel management, safety, skin/wound care, disease management, medication administration, pain management and patient education and help integrate therapy concepts, techniques,education, etc. 7. PT will assess and treat for/with: Lower extremity strength, range of motion, stamina, balance, functional mobility, safety, adaptive techniques and equipment, pain mgt, NMR. Goals are: supervision to min assist. 8. OT will assess and treat for/with: ADL's, functional mobility, safety, upper extremity strength, adaptive techniques and equipment, pain mgt, NMR. Goals are: supervision to min assist. 9. SLP will assess and treat for/with: cognition, swallowing . Goals are: supervision to mod I. 10. Case Management and Social Worker will assess and treat for psychological  issues and discharge planning. 11. Team conference will be held weekly to assess progress toward goals and to determine barriers to discharge. 12. Patient will receive at least 3 hours of therapy per day at least 5 days per week. 13. ELOS: 12-16 days  14. Prognosis: excellent Medical Problem List and Plan:  1. Rhabdomyolysis, deconditioning, history of brain glioma, and CVA  2. DVT Prophylaxis/Anticoagulation: SCDs. Monitor for any signs of DVT  3. Pain Management: Tylenol as needed. Monitor with increased mobility  4. Neuropsych: This patient is not capable of making decisions on his own behalf.  5. Dysphagia. Dysphagia 3 nectar thick liquids. Monitor for any signs of aspiration. Followup speech therapy  6. Seizure disorder. Keppra 750 mg twice a day, phenobarbital(luminal) 97.2 mg each bedtime. Monitor for any seizure activity  7. Mood. Aricept 5 mg each bedtime. Discuss cognitive baseline with family  8. Overactive bladder. Toviaz 8 mg each bedtime as prior to admission. Check bladder scans x3  9. Hyperlipidemia. Resume Lipitor  10. Gout, 1st right MTP----prednisone, colchicine for flare  Zachary T. Swartz, MD, FAAPMR  [facility]  01/20/2013  

## 2013-01-20 NOTE — Discharge Summary (Signed)
Physician Discharge Summary  Rodney Potts JXB:[id number] DOB: 1945-08-19 DOA: 01/14/2013  PCP: Pamelia Hoit, MD  Admit date: 01/14/2013 Discharge date: 01/20/2013  Recommendations for Outpatient Follow-up:  1. See your neurologist for follow up in 2 weeks 2. See your primary care provider in 2 weeks for follow up  3. Fall precautions 4. Seizure precautions  Discharge Diagnoses:  Principal Problem:   Difficulty walking Active Problems:   CVA (cerebral infarction)   Seizures   OSA (obstructive sleep apnea)   Glioma of brain   Rhabdomyolysis  Discharge Condition: stable  Diet recommendation: heart healthy Dys 3 with nectar thickened liquids  Filed Weights   01/14/13 2229  Weight: 176 lb 8 oz (80.06 kg)    History of present illness:  Rodney Potts is a 67 y.o. 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 Patient has been having frequent falls and has had a recent MRI done 2 days ago as ordered by the patient's neurologist. In the ER patient had CT head CT C-spine x-rays of the thoracic spine and pelvis and chest x-ray all of which does not show anything acute. On-call neurologist was consulted and at this time since patient is still feeling weak and unable to walk without assistance patient has been admitted for further management. Patient's CK levels also found to be high probably from fall. Patient denies any chest pain or shortness of breath nausea vomiting abdominal pain and presently is oriented x3.   Hospital Course:  1. Diffuse generalized weakness with difficulty with speech and LE weakness - Appreciate neurology following. Pt had barium swallow evaluation and now on Dys 3 diet and tolerating well. CT head neg for acute. MRI brain  reviewed, no sig changes from study 4/14. MRI C spine/Tspine reviewed, no critical findings. CIR consulted for rehab placement.  2. Rhabdomyolysis - Pt had been down on floor for some time before being found. CK level improved with IVF hydration.  Pt is tolerating p.o. better. 3. Fever - no recent fever reported, possibly related to rhabdo, CXR neg for infection, UA neg, blood cultures neg to date.  4. History of CVA - per neuro, continuing his antiplatelet agents 5. History of Seizures - no seizure activity seen.  likely from history of brain glioma - continuing keppra 750 mg po BID /phenobarb 6. HIstory of glioma - s/p chemoradiation treatment  Code Status: Full  Family Communication: Wife, sister Disposition Plan: rehab when bed available   Consultations:  neurology  Discharge Exam: Pt without complaints, comfortable, speaking clearly, sitting up  Filed Vitals:   01/20/13 0400  BP: 128/80  Pulse: 68  Temp: 99.4 F (37.4 C)  Resp: 18   General: awake, alert, no distress Cardiovascular: normal s1, s2 sounds Respiratory: BBS clear to auscultation  Discharge Instructions  Discharge Orders   Future Appointments Provider Department Dept Phone   02/17/2013 2:45 PM [name], MD [facility] [phone]   Future Orders Complete By Expires   Diet - low sodium heart healthy  As directed    Discharge instructions  As directed    Comments:     Seizure precautions Fall precautions Return if symptoms recur, worsen or new problems develop.   Discontinue IV  As directed  Increase activity slowly  As directed        Medication List         atorvastatin 40 MG tablet  Commonly known as:  LIPITOR  Take 40 mg by mouth at bedtime.     cetirizine 10 MG tablet  Commonly known as:  ZYRTEC  Take 10 mg by mouth daily.     clopidogrel 75 MG tablet  Commonly known as:  PLAVIX  Take 75 mg by mouth at bedtime.     donepezil 5 MG tablet  Commonly known as:   ARICEPT  Take 1 tablet (5 mg total) by mouth at bedtime.     fluticasone 50 MCG/ACT nasal spray  Commonly known as:  FLONASE  Place 2 sprays into both nostrils daily.     levETIRAcetam 750 MG tablet  Commonly known as:  KEPPRA  Take 1 tablet (750 mg total) by mouth every 12 (twelve) hours.     PHENobarbital 97.2 MG tablet  Commonly known as:  LUMINAL  Take 1 tablet (97.2 mg total) by mouth at bedtime.     TOVIAZ 8 MG Tb24 tablet  Generic drug:  fesoterodine  Take 8 mg by mouth daily.       Allergies  Allergen Reactions  . Amoxicillin Diarrhea  . Dilaudid [Hydromorphone Hcl] Other (See Comments)    DELUSIONS  . Lactose Intolerance (Gi) Diarrhea  . Sulfa Antibiotics Other (See Comments)    Cant control bladder       Follow-up Information   Follow up with Pamelia Hoit, MD. Schedule an appointment as soon as possible for a visit in 2 weeks.   Specialty:  Family Medicine   Contact information:   [address] [place] [address] [phone]       Follow up with Neurologist . Schedule an appointment as soon as possible for a visit in 2 weeks. (follow up seizure disorder)        The results of significant diagnostics from this hospitalization (including imaging, microbiology, ancillary and laboratory) are listed below for reference.    Significant Diagnostic Studies: Dg Chest 1 View  01/15/2013   CLINICAL DATA:  Fever.  Mental status changes.  EXAM: CHEST - 1 VIEW  COMPARISON:  01/14/2013  FINDINGS: Midline trachea. Normal heart size. Numerous leads and wires project over the chest. No pleural effusion or pneumothorax. Mild low low lung volumes. Improved to resolved bibasilar atelectasis. Mild right hemidiaphragm elevation.  IMPRESSION: Low lung volumes without acute disease.   Electronically Signed   By: Jeronimo Greaves M.D.   On: 01/15/2013 11:47   Dg Chest 2 View  01/14/2013   CLINICAL DATA:  Traumatic injury with pain  EXAM: CHEST  2 VIEW  COMPARISON:   06/07/2012  FINDINGS: Cardiac shadow is stable. The lungs are well aerated with mild bibasilar atelectasis. No acute bony abnormality is seen.  IMPRESSION: Mild bibasilar atelectasis.   Electronically Signed   By: Alcide Clever M.D.   On: 01/14/2013 19:52   Dg Thoracic Spine 4v  01/14/2013   CLINICAL DATA:  Traumatic injury and pain  EXAM: THORACIC SPINE - 4+ VIEW  COMPARISON:  None.  FINDINGS: No compression deformities are noted. The pedicles are within normal limits. Mild degenerative change of the low thoracic spine is seen.  IMPRESSION: No acute abnormality noted.   Electronically Signed   By: Alcide Clever M.D.   On: 01/14/2013 19:54   Dg Pelvis 1-2 Views  01/14/2013   CLINICAL DATA:  Traumatic injury and pain  EXAM: PELVIS - 1-2 VIEW  COMPARISON:  None.  FINDINGS: There is no evidence of pelvic fracture or diastasis. No other pelvic bone lesions are seen. Postsurgical changes are seen.  IMPRESSION: No acute abnormality noted.   Electronically Signed   By: Alcide Clever M.D.   On: 01/14/2013 19:54   Ct Head Wo Contrast  01/15/2013   CLINICAL DATA:  Bilateral leg weakness.  EXAM: CT HEAD WITHOUT CONTRAST  TECHNIQUE: Contiguous axial images were obtained from the base of the skull through the vertex without intravenous contrast.  COMPARISON:  01/14/2013  FINDINGS: Extensive chronic microvascular disease throughout the deep white matter. Old right MCA infarct in the region of the insular cortex. Underlying parenchymal calcifications. No acute intracranial abnormality. Specifically, no hemorrhage, hydrocephalus, mass lesion, acute infarction, or significant intracranial injury. No acute calvarial abnormality.  Mottled appearance throughout the calvarium with numerous small hypodensities. This may be related to bone demineralization/osteoporosis, but cannot completely exclude a more aggressive process such as myeloma. Recommend clinical correlation. Paranasal sinuses are clear.  IMPRESSION: Old right MCA  infarct.  Extensive chronic microvascular changes.  Mottled appearance with tiny innumerable hypodensities throughout the calvarium, question osteoporosis or a more aggressive process such as myeloma. Recommend clinical correlation.  No acute intracranial abnormality.   Electronically Signed   By: Charlett Nose M.D.   On: 01/15/2013 11:47   Ct Head (brain) Wo Contrast  01/14/2013   CLINICAL DATA:  Imbalance.  EXAM: CT HEAD WITHOUT CONTRAST  CT CERVICAL SPINE WITHOUT CONTRAST  TECHNIQUE: Multidetector CT imaging of the head and cervical spine was performed following the standard protocol without intravenous contrast. Multiplanar CT image reconstructions of the cervical spine were also generated.  COMPARISON:  11/14/2012.  FINDINGS: CT HEAD FINDINGS  Stable enlarged ventricles and subarachnoid spaces. Stable patchy white matter low density in both cerebral hemispheres and right frontal lobe calcification. Old right thalamic and bilateral basal ganglia lacunar infarcts. No intracranial hemorrhage, mass lesion or CT evidence of acute infarction.  CT CERVICAL SPINE FINDINGS  Reversal of the normal cervical lordosis is again demonstrated. Disk space narrowing and anterior and posterior spur formation at multiple levels is unchanged. Facet degenerative changes at multiple levels. No significant canal stenosis. Moderate foraminal stenosis on the right at the C4-5 level. Mild foraminal stenosis on the right at the C5-6 level. No focal disc herniations are visualized. Bilateral carotid artery calcifications are noted.  IMPRESSION: 1. No acute head or cervical spine abnormality. 2. Stable cerebral atrophy, chronic small vessel white matter ischemic changes and old lacunar infarcts. 3. Stable reversal of the normal cervical lordosis and multilevel degenerative changes without CT evidence of cord compression.   Electronically Signed   By: Gordan Payment M.D.   On: 01/14/2013 19:18   Ct Cervical Spine Wo Contrast  01/14/2013    CLINICAL DATA:  Imbalance.  EXAM: CT HEAD WITHOUT CONTRAST  CT CERVICAL SPINE WITHOUT CONTRAST  TECHNIQUE: Multidetector CT imaging of the head and cervical spine was performed following the standard protocol without intravenous contrast. Multiplanar CT image reconstructions of the cervical spine were also generated.  COMPARISON:  11/14/2012.  FINDINGS: CT HEAD FINDINGS  Stable enlarged ventricles and subarachnoid spaces. Stable patchy white matter low density in both cerebral hemispheres and right frontal lobe calcification. Old right thalamic and bilateral basal ganglia lacunar infarcts. No intracranial hemorrhage, mass lesion or CT evidence of acute infarction.  CT CERVICAL SPINE FINDINGS  Reversal of the normal cervical lordosis  is again demonstrated. Disk space narrowing and anterior and posterior spur formation at multiple levels is unchanged. Facet degenerative changes at multiple levels. No significant canal stenosis. Moderate foraminal stenosis on the right at the C4-5 level. Mild foraminal stenosis on the right at the C5-6 level. No focal disc herniations are visualized. Bilateral carotid artery calcifications are noted.  IMPRESSION: 1. No acute head or cervical spine abnormality. 2. Stable cerebral atrophy, chronic small vessel white matter ischemic changes and old lacunar infarcts. 3. Stable reversal of the normal cervical lordosis and multilevel degenerative changes without CT evidence of cord compression.   Electronically Signed   By: Gordan Payment M.D.   On: 01/14/2013 19:18   Mr Brain Wo Contrast  01/16/2013   [facility] [address] [place], [place] [address] [phone]  NEUROIMAGING REPORT   STUDY DATE: 01/12/13 PATIENT NAME: Burdette Forehand Hamza DOB: Apr 18, 1945 MRN: [id number]  ORDERING CLINICIAN: Heide Guile, NP CLINICAL HISTORY:  67 year male with gait abnormality COMPARISON FILMS: MRI Brain 06/08/12 EXAM: MRI Brain wo TECHNIQUE: MRI of the brain without contrast was  obtained utilizing 5 mm  axial slices with T1, T2, T2 flair, T2 star gradient echo and diffusion  weighted views.  T1 sagittal and T2 coronal views were obtained. CONTRAST: none IMAGING SITE: [facility] FINDINGS:  The brain parenchyma shows area of encephalomalacia and gliosis in the  right posterior frontal anterior temporal head regions which may be  residua of prior tumor surgery. There are extensive changes of  periventricular and subcortical confluent white matter hyperintensities  which represent postradiation changes or changes of small vessel ischemia.  There are small remote age right thalamic and left basal ganglia lacunar  infarcts There are multiple scattered tiny microhemorrhages seen on  susceptibility weighted images which appears progressed compared with  similar changes on gradient echo images on prior MRI from 06/08/12 but this  may be due to differences between the 2 techniques. There is mild degree  of generalized cerebral atrophy.No abnormal lesions are seen on  diffusion-weighted views to suggest acute ischemia. The cortical sulci,  fissures and cisterns are normal in size and appearance. Lateral, third  and fourth ventricle are normal in size and appearance. No extra-axial  fluid collections are seen. No evidence of mass effect or midline shift.   On sagittal views the posterior fossa, pituitary gland and corpus callosum  are unremarkable. No evidence of intracranial hemorrhage on gradient-echo  views. The orbits and their contents, paranasal sinuses and calvarium are  unremarkable.  Intracranial flow voids are present.      01/16/2013    Abnormal MRI scan of the brain showing stable changes of  right posterior frontal and anterior temporal encephalomalacia, gliosis,  remote age small right thalamic and left basal ganglia lacunar infarcts  and extensive periventricular subcortical nonspecific white matter  hyperintensities related to small vessel disease. There is mild  generalized  cerebral atrophy. Overall no significant change compared to  his MRI scan dated 06/08/2012.   INTERPRETING PHYSICIAN:  Delia Heady, MD Certified in  Neuroimaging by American Society of Neuroimaging and [place]  [employer]    Mr Cervical Spine Wo Contrast  01/16/2013   CLINICAL DATA:  Difficulty walking was falls recently. Rule out spinal stenosis.  EXAM: MRI CERVICAL SPINE WITHOUT CONTRAST; MRI THORACIC SPINE WITHOUT CONTRAST  TECHNIQUE: Multiplanar, multisequence MR imaging was performed. No intravenous contrast was administered.  COMPARISON:  01/14/2013 cervical spine CT. No comparison cervical spine  or thoracic spine MR.  FINDINGS: Cervical spine MR:  No focal cervical cord signal abnormality.  Bone marrow changes of the clivus, C1 and majority of C2 vertebral body may reflect changes of radiation therapy. Decreased signal intensity of remainder visualized vertebra are may reflect result of underlying anemia.  No osseous or soft tissue edema to suggest the presence of acute osseous or soft tissue injury.  Visualized paravertebral structures unremarkable. Both vertebral arteries are patent and mildly ectatic.  C2-3:  Negative.  C3-4: Minimal bulge/ osteophyte. Uncinate hypertrophy greater on the left. Mild foraminal narrowing greater on the left.  C4-5: Mild bulge. Mild spinal stenosis. Minimal cord contact without compression. Uncinate hypertrophy with mild bilateral foraminal narrowing.  C5-6: Shallow broad-based disc osteophyte complex. Mild spinal stenosis. Minimal cord contact without significant compression. Uncinate hypertrophy with mild bilateral foraminal narrowing.  C6-7: Shallow right posterior lateral protrusion. Very mild right sided spinal stenosis.  C7-T1:  Negative.  Thoracic spine MR:  Mild ectasia thoracic aorta. Visualized paravertebral structures otherwise unremarkable.  No focal thoracic cord signal abnormality.  Scattered focal fatty deposits/ hemangiomas and  endplate reactive changes without worrisome osseous lesion noted.  Mild curvature of the thoracic spine.  T1-2: Minimal foraminal narrowing.  Minimal bulge.  T2-3: Minimal bulge greater to the right. Mild facet joint degenerative changes greater on the right.  T3-4: Minimal facet joint degenerative changes.  T4-5: Minimal Schmorl's node deformity.  T5-6: Minimal bulge.  T6-7: Negative.  T7-8: Shallow protrusion greater to left. Minimal left-sided spinal stenosis. No cord compression.  T8-9: Shallow right posterior lateral protrusion. No cord compression.  T9-10: Negative.  T10-11: Posterior element hypertrophy greater on the left. Mild bulge. Mild spinal stenosis slightly greater on left. No cord compression.  T11-12: Bulge slightly greater to the right. Minimal right sided spinal stenosis. Mild facet joint degenerative changes.  T12-L1:  Negative.  IMPRESSION: Minimal to mild cervical spondylotic changes most notable C5-6 level. Although there is minimal cord contact at the C4-5 and C5-6 level, no significant cord compression noted. Please see above.  Minimal to mild thoracic spine degenerative changes without thoracic cord compression or significant spinal stenosis. Findings most prominent T10-11 level as detailed above.   Electronically Signed   By: Bridgett Larsson M.D.   On: 01/16/2013 09:52   Mr Thoracic Spine Wo Contrast  01/16/2013   CLINICAL DATA:  Difficulty walking was falls recently. Rule out spinal stenosis.  EXAM: MRI CERVICAL SPINE WITHOUT CONTRAST; MRI THORACIC SPINE WITHOUT CONTRAST  TECHNIQUE: Multiplanar, multisequence MR imaging was performed. No intravenous contrast was administered.  COMPARISON:  01/14/2013 cervical spine CT. No comparison cervical spine or thoracic spine MR.  FINDINGS: Cervical spine MR:  No focal cervical cord signal abnormality.  Bone marrow changes of the clivus, C1 and majority of C2 vertebral body may reflect changes of radiation therapy. Decreased signal intensity of  remainder visualized vertebra are may reflect result of underlying anemia.  No osseous or soft tissue edema to suggest the presence of acute osseous or soft tissue injury.  Visualized paravertebral structures unremarkable. Both vertebral arteries are patent and mildly ectatic.  C2-3:  Negative.  C3-4: Minimal bulge/ osteophyte. Uncinate hypertrophy greater on the left. Mild foraminal narrowing greater on the left.  C4-5: Mild bulge. Mild spinal stenosis. Minimal cord contact without compression. Uncinate hypertrophy with mild bilateral foraminal narrowing.  C5-6: Shallow broad-based disc osteophyte complex. Mild spinal stenosis. Minimal cord contact without significant compression. Uncinate hypertrophy with mild bilateral foraminal narrowing.  C6-7:  Shallow right posterior lateral protrusion. Very mild right sided spinal stenosis.  C7-T1:  Negative.  Thoracic spine MR:  Mild ectasia thoracic aorta. Visualized paravertebral structures otherwise unremarkable.  No focal thoracic cord signal abnormality.  Scattered focal fatty deposits/ hemangiomas and endplate reactive changes without worrisome osseous lesion noted.  Mild curvature of the thoracic spine.  T1-2: Minimal foraminal narrowing.  Minimal bulge.  T2-3: Minimal bulge greater to the right. Mild facet joint degenerative changes greater on the right.  T3-4: Minimal facet joint degenerative changes.  T4-5: Minimal Schmorl's node deformity.  T5-6: Minimal bulge.  T6-7: Negative.  T7-8: Shallow protrusion greater to left. Minimal left-sided spinal stenosis. No cord compression.  T8-9: Shallow right posterior lateral protrusion. No cord compression.  T9-10: Negative.  T10-11: Posterior element hypertrophy greater on the left. Mild bulge. Mild spinal stenosis slightly greater on left. No cord compression.  T11-12: Bulge slightly greater to the right. Minimal right sided spinal stenosis. Mild facet joint degenerative changes.  T12-L1:  Negative.  IMPRESSION: Minimal to  mild cervical spondylotic changes most notable C5-6 level. Although there is minimal cord contact at the C4-5 and C5-6 level, no significant cord compression noted. Please see above.  Minimal to mild thoracic spine degenerative changes without thoracic cord compression or significant spinal stenosis. Findings most prominent T10-11 level as detailed above.   Electronically Signed   By: Bridgett Larsson M.D.   On: 01/16/2013 09:52   Dg Swallowing Func-speech Pathology  01/16/2013   Breck Coons [place], CCC-SLP     01/16/2013  1:22 PM Objective Swallowing Evaluation: Modified Barium Swallowing Study   Patient Details  Name: KHING BELCHER MRN: [id number] Date of Birth: 10/23/1945  Today's Date: 01/16/2013 Time: 1005-1030 SLP Time Calculation (min): 25 min  Past Medical History:  Past Medical History  Diagnosis Date  . Brain tumor     glioma; S/P radiation and chemotherapy (01/14/2013)  . TIA (transient ischemic attack) ~ 2011  . IBS (irritable bowel syndrome)   . Shingles   . CVA (cerebral infarction)     R thalamic  . Allergy   . Complication of anesthesia     "after knee OR woke up very wild; pulling things out, etc"  (01/14/2013)  . High cholesterol   . Sleep apnea     "corrected w/OR" (01/14/2013)  . Seizures     "hx from the scar tissue where brain tumor was; on RX to  control" (01/14/2013)  . Stroke     "he's had a couple; still a little weaker on the left leg,  drags that foot" (01/14/2013)  . Prostate cancer    Past Surgical History:  Past Surgical History  Procedure Laterality Date  . Cataract extraction w/ intraocular lens implant Bilateral  2000's    "don't know if they put lens in" (01/14/2013)  . Joint replacement    . Prostatectomy  1990's  . Uvulopalatopharyngoplasty  1980's    "for sleep apnea" (01/14/2013)  . Shoulder arthroscopy Right 2000's  . Total knee arthroplasty Bilateral 2000's   HPI:  Lawerance Matsuo Moates is a 67 y.o. male with a history of brain tumor  status post radiation in 1982 who has been  having difficulty with  falls over the past year. He had a stroke in April of this year,  and had difficulty following that but had improved to the point  that he was able to walk without a cane. Over the past couple  weeks, however, he has had worsening  of his gait and then he had  a fall down some stairs and was unable to get up from the bottom.  On the morning of 01/15/13, pt was found to have altered  mentation and per RN has seemed to have difficulty managing his  secretions since that time.       Assessment / Plan / Recommendation Clinical Impression  Dysphagia Diagnosis: Mild oral phase dysphagia;Mild pharyngeal  phase dysphagia;Moderate pharyngeal phase dysphagia Clinical impression: Pt. exhibited mild oral and mild-moderate  motor based pharyngeal dysphagia.  Oral prep and transit of  cracker mildly delayed.  Oropharyngeal deficits observed included  reduced tongue base retraction, decreased laryngeal elevation  causing laryngeal penetration with thin barium and moderate  vallecular and mild pyriform sinus residue.  Chin tuck was  ineffective to prevent/eliminate penetration with thin, however  vallecular residue was reduced with chin tuck posture.  Recommend  Dys 3 diet and nectar thick liquids, chin tuck with food/liquid,  no straws, pills whole in applesauce, second swallow.   ST will  continue to follow for safety and ability to upgrade po's when  appropriate.    Treatment Recommendation  Therapy as outlined in treatment plan below    Diet Recommendation Dysphagia 3 (Mechanical Soft);Nectar-thick  liquid   Liquid Administration via: Cup;No straw Medication Administration: Whole meds with puree Supervision: Patient able to self feed;Full supervision/cueing  for compensatory strategies Compensations: Slow rate;Small sips/bites;Multiple dry swallows  after each bite/sip Postural Changes and/or Swallow Maneuvers: Seated upright 90  degrees    Other  Recommendations Oral Care Recommendations: Oral care BID    Follow Up Recommendations  Inpatient Rehab    Frequency and Duration min 2x/week  2 weeks   Pertinent Vitals/Pain n/a            Reason for Referral Objectively evaluate swallowing function   Oral Phase Oral Preparation/Oral Phase Oral Phase: Impaired Oral - Solids Oral - Regular: Delayed oral transit;Weak lingual manipulation   Pharyngeal Phase Pharyngeal Phase Pharyngeal Phase: Impaired Pharyngeal - Honey Pharyngeal - Honey Teaspoon: Pharyngeal residue -  valleculae;Reduced tongue base retraction Pharyngeal - Honey Cup: Pharyngeal residue - valleculae;Reduced  tongue base retraction;Pharyngeal residue - pyriform  sinuses;Reduced laryngeal elevation Pharyngeal - Nectar Pharyngeal - Nectar Teaspoon: Pharyngeal residue -  valleculae;Pharyngeal residue - pyriform sinuses;Reduced tongue  base retraction;Reduced laryngeal elevation Pharyngeal - Nectar Cup: Pharyngeal residue - pyriform  sinuses;Pharyngeal residue - valleculae;Reduced tongue base  retraction;Reduced laryngeal elevation Pharyngeal - Thin Pharyngeal - Thin Teaspoon: Pharyngeal residue -  valleculae;Pharyngeal residue - pyriform sinuses;Reduced tongue  base retraction;Reduced laryngeal  elevation;Penetration/Aspiration during swallow (chin tuck  effective with tsp) Penetration/Aspiration details (thin teaspoon): Material enters  airway, CONTACTS cords and not ejected out Pharyngeal - Thin Cup: Pharyngeal residue - pyriform  sinuses;Pharyngeal residue - valleculae;Reduced tongue base  retraction;Reduced laryngeal elevation;Penetration/Aspiration  during swallow (chin tuck not consistently effective) Penetration/Aspiration details (thin cup): Material enters  airway, remains ABOVE vocal cords and not ejected out Pharyngeal - Solids Pharyngeal - Puree: Pharyngeal residue - valleculae;Reduced  tongue base retraction Pharyngeal - Regular: Pharyngeal residue - valleculae;Reduced  tongue base retraction;Pharyngeal residue - pyriform  sinuses;Reduced laryngeal  elevation  Cervical Esophageal Phase    GO    Cervical Esophageal Phase Cervical Esophageal Phase: [facility]         Darrow Bussing.Ed CCC-SLP Pager [phone]  01/16/2013    Microbiology: Recent Results (from the past 240 hour(s))  CULTURE, BLOOD (ROUTINE X 2)     Status: None  Collection Time    01/15/13 12:45 PM      Result Value Range Status   Specimen Description BLOOD LEFT HAND   Final   Special Requests BOTTLES DRAWN AEROBIC ONLY 1.5CC   Final   Culture  Setup Time     Final   Value: 01/15/2013 17:39     Performed at [facility]   Culture     Final   Value:        BLOOD CULTURE RECEIVED NO GROWTH TO DATE CULTURE WILL BE HELD FOR 5 DAYS BEFORE ISSUING A FINAL NEGATIVE REPORT     Performed at [facility]   Report Status PENDING   Incomplete  CULTURE, BLOOD (ROUTINE X 2)     Status: None   Collection Time    01/15/13 12:52 PM      Result Value Range Status   Specimen Description BLOOD LEFT FINGER   Final   Special Requests BOTTLES DRAWN AEROBIC ONLY 2CC   Final   Culture  Setup Time     Final   Value: 01/15/2013 17:39     Performed at [facility]   Culture     Final   Value:        BLOOD CULTURE RECEIVED NO GROWTH TO DATE CULTURE WILL BE HELD FOR 5 DAYS BEFORE ISSUING A FINAL NEGATIVE REPORT     Performed at [facility]   Report Status PENDING   Incomplete     Labs: Basic Metabolic Panel:  Recent Labs Lab 01/14/13 1720 01/15/13 0405 01/16/13 0437 01/17/13 1000  NA 135 134* 133* 139  K 4.8 4.0 3.9 3.5  CL 96 98 99 103  CO2 24 23 20 26   GLUCOSE 83 78 88 121*  BUN 17 16 17 16   CREATININE 0.89 0.87 0.83 0.80  CALCIUM 9.8 8.7 7.5* 8.4   Liver Function Tests:  Recent Labs Lab 01/14/13 1720 01/15/13 0405 01/16/13 0437  AST 81* 94* 77*  ALT 25 25 24   ALKPHOS 71 55 41  BILITOT 0.3 0.3 0.2*  PROT 8.5* 7.0 5.7*  ALBUMIN 4.7 3.8 2.9*   No results found for this basename: LIPASE, AMYLASE,  in the last 168 hours No results  found for this basename: AMMONIA,  in the last 168 hours CBC:  Recent Labs Lab 01/15/13 0134 01/15/13 0405 01/16/13 0437  WBC 6.2 6.6 7.7  NEUTROABS 4.0  --  5.5  HGB 11.6* 12.3* 11.0*  HCT 35.6* 37.3* 32.7*  MCV 91.8 91.9 90.1  PLT 195 193 170   Cardiac Enzymes:  Recent Labs Lab 01/14/13 1720 01/14/13 1956 01/15/13 0405 01/16/13 0437 01/17/13 1000 01/18/13 0532  CKTOTAL  --  3062* 4081* 2544* 2466* 1474*  TROPONINI <0.30  --  <0.30  --   --   --    BNP: BNP (last 3 results) No results found for this basename: PROBNP,  in the last 8760 hours CBG:  Recent Labs Lab 01/14/13 1745  GLUCAP 91   Signed:  Liany Mumpower  [facility] 01/20/2013, 8:25 AM

## 2013-01-20 NOTE — Progress Notes (Signed)
Physical Therapy Treatment Patient Details Name: Rodney Potts MRN: [id number] DOB: 1945-05-23 Today's Date: 01/20/2013 Time: 6578-4696 PT Time Calculation (min): 30 min  PT Assessment / Plan / Recommendation  History of Present Illness Rodney Potts is a 67 y.o. male with a history of brain tumor status post radiation in 1982 who has been having difficulty with falls over the past year. He had a stroke in April of this year, and had difficulty following that but had improved to the point that he was able to walk without a cane. Wife reports that even this weekend he was carrying wood into the house.Over the past couple weeks, however, he has had worsening of his gait and then had a fall down some stairs and was unable to get up from the bottom. Wife reports min cognitive deficits primarily affecting memory due to prior stroke. Pt working with PT 11/13 noted to have significant change in physical and cognitive status, rapid response called and pt being followed closely. CT scan showed no acute changes.   PT Comments   Patient continues to improve with activity tolerance in terms of endurance.  Now limited due to right great toe swelling, heat, warmth and pain.  Curious for gout flare though patient has never had gout before.  Feel evaluation once on rehab this afternoon will improve tolerance to therapies.  Agree with CIR level rehab.  Follow Up Recommendations  CIR     Does the patient have the potential to tolerate intense rehabilitation   Yes  Barriers to Discharge  None      Equipment Recommendations  Rolling walker with 5" wheels    Recommendations for Other Services  None  Frequency Min 4X/week   Progress towards PT Goals Progress towards PT goals: Progressing toward goals  Plan Current plan remains appropriate    Precautions / Restrictions Precautions Precautions: Fall Precaution Comments:  h/o falls Restrictions Weight Bearing Restrictions: No Other Position/Activity  Restrictions: pt reports pain in right big to which limits weight bearing into right foot.  patient and sister report "doctor thinks it might be gout"  Patient reports he has never had gout.   Pertinent Vitals/Pain Right great toe 8/10 with ambulation    Mobility  Bed Mobility Bed Mobility: Not assessed Rolling Left: 3: Mod assist Left Sidelying to Sit: 3: Mod assist Sitting - Scoot to Edge of Bed: 4: Min guard Details for Bed Mobility Assistance: pt in chair Transfers Sit to Stand: 3: Mod assist;With armrests;From chair/3-in-1;With upper extremity assist Stand to Sit: 3: Mod assist;To chair/3-in-1;With upper extremity assist Details for Transfer Assistance: assist and cues to scoot to edge of chair and for placing feet under knees. Ambulation/Gait Ambulation/Gait Assistance: 4: Min assist Ambulation Distance (Feet): 30 Feet (x 2) Assistive device: Rolling walker Ambulation/Gait Assistance Details: 30' x 2 with cues for proximity to walker, for posture and safety; demonstrates good perception of obstacles in room able to get walker through tight space with min assist Gait Pattern: Trunk rotated posteriorly on left;Trunk flexed;Shuffle;Decreased stride length      PT Goals (current goals can now be found in the care plan section) Acute Rehab PT Goals Patient Stated Goal: Go home  Visit Information  Last PT Received On: 01/20/13 Assistance Needed: +1 History of Present Illness: Rodney Potts is a 67 y.o. male with a history of brain tumor status post radiation in 1982 who has been having difficulty with falls over the past year. He had a stroke in April  of this year, and had difficulty following that but had improved to the point that he was able to walk without a cane. Wife reports that even this weekend he was carrying wood into the house.Over the past couple weeks, however, he has had worsening of his gait and then had a fall down some stairs and was unable to get up from the  bottom. Wife reports min cognitive deficits primarily affecting memory due to prior stroke. Pt working with PT 11/13 noted to have significant change in physical and cognitive status, rapid response called and pt being followed closely. CT scan showed no acute changes.    Subjective Data  Patient Stated Goal: Go home   Cognition  Cognition Arousal/Alertness: Awake/alert Behavior During Therapy: Flat affect Overall Cognitive Status: Impaired/Different from baseline Area of Impairment: Orientation;Attention;Safety/judgement;Awareness Orientation Level: Disoriented to;Place;Time;Situation Current Attention Level: Sustained Following Commands: Follows one step commands consistently Safety/Judgement: Decreased awareness of safety Awareness: Intellectual Problem Solving: Slow processing;Decreased initiation;Difficulty sequencing;Requires verbal cues;Requires tactile cues    Balance  Balance Balance Assessed: Yes Dynamic Standing Balance Dynamic Standing - Balance Support: During functional activity Dynamic Standing - Level of Assistance: 4: Min assist Dynamic Standing - Comments: assist for hygiene after toileting and for donning briefs  End of Session PT - End of Session Equipment Utilized During Treatment: Gait belt Activity Tolerance: Patient limited by pain Patient left: in chair;with call bell/phone within reach;with family/visitor present   GP     [facility] 01/20/2013, 2:40 PM [place], PT [phone] 01/20/2013

## 2013-01-20 NOTE — Progress Notes (Signed)
Occupational Therapy Treatment Patient Details Name: Rodney Potts MRN: [id number] DOB: 1945-10-20 Today's Date: 01/20/2013 Time: 4696-2952 OT Time Calculation (min): 22 min  OT Assessment / Plan / Recommendation  History of present illness Rodney Potts is a 67 y.o. male with a history of brain tumor status post radiation in 1982 who has been having difficulty with falls over the past year. He had a stroke in April of this year, and had difficulty following that but had improved to the point that he was able to walk without a cane. Wife reports that even this weekend he was carrying wood into the house.Over the past couple weeks, however, he has had worsening of his gait and then had a fall down some stairs and was unable to get up from the bottom. Wife reports min cognitive deficits primarily affecting memory due to prior stroke. Pt working with PT 11/13 noted to have significant change in physical and cognitive status, rapid response called and pt being followed closely. CT scan showed no acute changes.   OT comments  Required encouragement to get OOB and participate in simple self care task.  Patient's sister present and supportive during session.  Patient continues to require cues for technique and safety with all functional mobility and mod assist for sit>stand.  Patient reluctant to put much weight on right foot due to pain in big toe, "might be gout".    Follow Up Recommendations  CIR;Supervision/Assistance - 24 hour    Frequency Min 3X/week   Progress towards OT Goals Progress towards OT goals: Progressing toward goals  Plan Discharge plan remains appropriate    Precautions / Restrictions Precautions Precautions: Fall Precaution Comments:  h/o falls Restrictions Weight Bearing Restrictions: No Other Position/Activity Restrictions: pt reports pain in right big to which limits weight bearing into right foot.  patient and sister report "doctor thinks it might be gout"  Patient  reports he has never had gout.   Pertinent Vitals/Pain Reports low back pain and right foot pain, not rated, able to participate with modified/adapted techniques, repositioned.    ADL  Grooming: Performed;Set up;Wash/dry hands;Wash/dry face Where Assessed - Grooming: Supported standing Transfers/Ambulation Related to ADLs: Pt unsteady with transfers and had difficulty with weight shifting from R to L when ambulate and to pivot. ADL Comments: Patient declined bath, dress or toileting.  Required encouragement to get OOB to participate due to reports back pain from falling down the stairs last Wed and now also with right foot (great toe) pain.    OT Goals(current goals can now be found in the care plan section) Acute Rehab OT Goals Patient Stated Goal: Go home  Visit Information  Last OT Received On: 01/20/13 History of Present Illness: Rodney Potts is a 67 y.o. male with a history of brain tumor status post radiation in 1982 who has been having difficulty with falls over the past year. He had a stroke in April of this year, and had difficulty following that but had improved to the point that he was able to walk without a cane. Wife reports that even this weekend he was carrying wood into the house.Over the past couple weeks, however, he has had worsening of his gait and then had a fall down some stairs and was unable to get up from the bottom. Wife reports min cognitive deficits primarily affecting memory due to prior stroke. Pt working with PT 11/13 noted to have significant change in physical and cognitive status, rapid response called and pt  being followed closely. CT scan showed no acute changes.    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  ) Area of Impairment: Orientation;Attention;Safety/judgement;Awareness Orientation Level: Disoriented to;Place;Time;Situation Current Attention Level: Sustained Following Commands: Follows one step commands consistently Safety/Judgement: Decreased awareness of safety Awareness: Intellectual Problem Solving: Slow processing;Decreased initiation;Difficulty sequencing;Requires verbal cues;Requires tactile cues    Mobility  Bed Mobility Bed Mobility: Rolling Left;Left Sidelying to Sit;Sitting - Scoot to Edge of Bed Rolling Left: 3: Mod assist Left Sidelying to Sit: 3: Mod assist Sitting - Scoot to Edge of Bed: 4: Min guard Details for Bed Mobility Assistance: pt requires cues for sequencing, assist for LEs but able to lift trunk using rail Transfers Transfers: Sit to Stand;Stand to Sit Sit to Stand: 3: Mod assist Stand to Sit: 4: Min assist Details for Transfer Assistance: pt requires increased time and verbal cues for safety and sequencing but able to perform with less physical assist today    End of Session OT - End of Session Equipment Utilized During Treatment: Gait belt;Rolling walker Activity Tolerance: Patient tolerated treatment well;Patient limited by pain Patient left: in chair;with family/visitor present;with call bell/phone within reach  GO     Rodney Potts 01/20/2013, 12:12 PM

## 2013-01-20 NOTE — Progress Notes (Signed)
I have insurance approval and will make arrangements to admit pt to inpt rehab today. [phone]

## 2013-01-20 NOTE — PMR Pre-admission (Signed)
PMR Admission Coordinator Pre-Admission Assessment  Patient: Rodney Potts is an 67 y.o., male MRN: [id number] DOB: [birth year] Height: 5\' 9"  (175.3 cm) Weight: 80.06 kg (176 lb 8 oz)              Insurance Information HMO: yes    PPO:      PCP:      IPA:      80/20:      OTHER: medicare advantage plan PRIMARY: Blue Medicare      Policy#: [id number]      Subscriber: pt CM Name: Linzie Collin      Phone#: [phone]     Fax#: [phone] Pre-Cert#: [id number]      Employer: retired. F/u with   On     Benefits:  Phone #: [phone]     Name: 01/19/13 Eff. Date: 03/05/12     Deduct: none      Out of Pocket Max: $2500      Life Max: none CIR: $100 copay per day, days 1 - 7      SNF: no copay days 1 - 10: $50 copay days 11-100 Outpatient: $20 copay per visit     Co-Pay: no visit limit Home Health: 100%  Co-Pay: no visit limit DME: 80%     Co-Pay: 20% Providers: in network  SECONDARY: none      Emergency Contact Information Contact Information   Name Relation Home Work Mobile   Zeiner,Rosemary Spouse [phone]     Current Medical History  Patient Admitting Diagnosis: rhabdomyolysis, deconditioning, hx of brain glioma, and CVA  History of Present Illness: Rodney Potts is a 67 y.o. 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 Patient ambulated with a cane prior to admission. Admitted 01/15/2013 with diffuse generalized weakness and falls. Patient was found down by his family after extended time.  Cranial CT scan x2 negative. MRI of the brain 01/16/2013 showing stable changes of right posterior frontal and anterior temporal encephalomalacia remote a small right thalamic and left basal ganglia lacunar infarcts and extensive periventricular subcortical nonspecific white matter hyperintensities related to small vessel disease. Overall no significant changes  compared to prior MRI scan 06/08/2012.  MRI cervical thoracic spine unremarkable. No further workup indicated as per neurology services. Maintained on a dysphagia 3 nectar thick liquid diet.   Total: 8 NIH    Past Medical History  Past Medical History  Diagnosis Date  . Brain tumor     glioma; S/P radiation and chemotherapy (01/14/2013)  . TIA (transient ischemic attack) ~ 2011  . IBS (irritable bowel syndrome)   . Shingles   . CVA (cerebral infarction)     R thalamic  . Allergy   . Complication of anesthesia     "after knee OR woke up very wild; pulling things out, etc" (01/14/2013)  . High cholesterol   . Sleep apnea     "corrected w/OR" (01/14/2013)  . Seizures     "hx from the scar tissue where brain tumor was; on RX to control" (01/14/2013)  . Stroke     "he's had a couple; still a little weaker on the left leg, drags that foot" (01/14/2013)  . Prostate cancer     Family History  family history includes Cancer in his mother; Hypertension in his father; Transient ischemic attack in his mother.  Prior Rehab/Hospitalizations: none  Current Medications  Current facility-administered medications:acetaminophen (TYLENOL) suppository  650 mg, 650 mg, Rectal, Q6H PRN, Eduard Clos, MD, 650 mg at 01/15/13 2122;  acetaminophen (TYLENOL) tablet 650 mg, 650 mg, Oral, Q6H PRN, Eduard Clos, MD, 650 mg at 01/20/13 0308;  clopidogrel (PLAVIX) tablet 75 mg, 75 mg, Oral, QHS, Ritta Slot, MD, 75 mg at 01/19/13 2210 donepezil (ARICEPT) tablet 5 mg, 5 mg, Oral, QHS, Ritta Slot, MD, 5 mg at 01/19/13 2210;  fesoterodine (TOVIAZ) tablet 8 mg, 8 mg, Oral, Daily, Eduard Clos, MD, 8 mg at 01/20/13 1031;  fluticasone (FLONASE) 50 MCG/ACT nasal spray 2 spray, 2 spray, Each Nare, Daily, Eduard Clos, MD, 2 spray at 01/20/13 1031 guaiFENesin (ROBITUSSIN) 100 MG/5ML solution 100 mg, 5 mL, Oral, Q4H PRN, Rolan Lipa, NP, 100 mg at 01/19/13 1005;   guaiFENesin (ROBITUSSIN) 100 MG/5ML solution 100 mg, 5 mL, Oral, Q4H PRN, Clanford Cyndie Mull, MD, 100 mg at 01/20/13 0308;  hydrocortisone cream 1 %, , Topical, TID PRN, Clanford Cyndie Mull, MD;  ibuprofen (ADVIL,MOTRIN) tablet 600 mg, 600 mg, Oral, TID PRN, Cleora Fleet, MD, 600 mg at 01/19/13 0902 influenza vac split quadrivalent PF (FLUARIX) injection 0.5 mL, 0.5 mL, Intramuscular, Tomorrow-1000, Eduard Clos, MD;  levETIRAcetam (KEPPRA) tablet 750 mg, 750 mg, Oral, BID, Clanford Cyndie Mull, MD, 750 mg at 01/20/13 1031;  loperamide (IMODIUM) capsule 2 mg, 2 mg, Oral, PRN, Noel Christmas, 2 mg at 01/20/13 0317;  loratadine (CLARITIN) tablet 10 mg, 10 mg, Oral, Daily, Eduard Clos, MD, 10 mg at 01/20/13 1031 ondansetron (ZOFRAN) injection 4 mg, 4 mg, Intravenous, Q6H PRN, Eduard Clos, MD;  ondansetron (ZOFRAN) tablet 4 mg, 4 mg, Oral, Q6H PRN, Eduard Clos, MD;  PHENobarbital (LUMINAL) tablet 97.2 mg, 97.2 mg, Oral, QHS, Ritta Slot, MD, 97.2 mg at 01/19/13 2210;  pneumococcal 23 valent vaccine (PNU-IMMUNE) injection 0.5 mL, 0.5 mL, Intramuscular, Tomorrow-1000, Eduard Clos, MD RESOURCE THICKENUP CLEAR, , Oral, PRN, Clanford Cyndie Mull, MD;  sodium chloride 0.9 % injection 3 mL, 3 mL, Intravenous, Q12H, Eduard Clos, MD, 3 mL at 01/20/13 1031  Patients Current Diet: Dysphagia  3 diet with nectar thick liquids  Precautions / Restrictions Precautions Precautions: Fall Precaution Comments:  h/o falls Restrictions Weight Bearing Restrictions: No Other Position/Activity Restrictions: pt reports pain in right big to which limits weight bearing into right foot.  patient and sister report "doctor thinks it might be gout"  Patient reports he has never had gout.   Prior Activity Level   Home Assistive Devices / Equipment Home Assistive Devices/Equipment: Cane (specify quad or straight);Eyeglasses;Grab bars in shower;Other (Comment) (straight cane; pad  for urinary leakage) Home Equipment: Cane - single point;Walker - 2 wheels;Bedside commode;Shower seat  Prior Functional Level Prior Function Level of Independence: Independent with assistive device(s) Comments: Pt reports he was still ambulatory but having more falls than normal. A few a week  Current Functional Level Cognition  Arousal/Alertness: Awake/alert Overall Cognitive Status: Impaired/Different from baseline (unsure what baseline is.  ) Current Attention Level: Sustained Orientation Level: Oriented X4 Following Commands: Follows one step commands consistently Safety/Judgement: Decreased awareness of safety General Comments: Pt demonstrating much improvement from yesterday but still with signfiicant deficits as compared to baseline per wife Attention: Sustained (likely deficits with higer level attention) Sustained Attention: Appears intact Memory: Impaired Memory Impairment: Storage deficit;Retrieval deficit;Decreased recall of new information;Decreased short term memory;Prospective memory (uncertain of baseline memory) Decreased Short Term Memory: Verbal basic Awareness: Impaired Awareness Impairment: Emergent impairment;Anticipatory impairment Problem Solving: Impaired  Problem Solving Impairment: Verbal basic Executive Function: Organizing;Self Monitoring;Self Correcting;Sequencing Safety/Judgment: Impaired    Extremity Assessment (includes Sensation/Coordination)          ADLs  Eating/Feeding: Performed;Set up;Minimal assistance Where Assessed - Eating/Feeding: Chair Grooming: Performed;Set up;Wash/dry hands;Wash/dry face Where Assessed - Grooming: Supported standing Upper Body Bathing: Simulated;Minimal assistance Where Assessed - Upper Body Bathing: Supported sitting Lower Body Bathing: Performed;Moderate assistance Where Assessed - Lower Body Bathing: Supported sit to stand Upper Body Dressing: Performed;Minimal assistance Where Assessed - Upper Body  Dressing: Supported sitting Lower Body Dressing: Performed;Moderate assistance Where Assessed - Lower Body Dressing: Supported sit to Pharmacist, hospital: Performed;Moderate assistance Toilet Transfer Method: Stand pivot Toilet Transfer Equipment: Bedside commode Toileting - Clothing Manipulation and Hygiene: Performed;Moderate assistance Where Assessed - Toileting Clothing Manipulation and Hygiene: Standing Equipment Used: Rolling walker Transfers/Ambulation Related to ADLs: Pt unsteady with transfers and had difficulty with weight shifting from R to L when ambulate and to pivot. ADL Comments: Patient declined bath, dress or toileting.  Required encouragement to get OOB to participate due to reports back pain from falling down the stairs last Wed and now also with right foot (great toe) pain.    Mobility  Bed Mobility: Rolling Right;Right Sidelying to Sit Rolling Right: 3: Mod assist Rolling Left: 4: Min assist Right Sidelying to Sit: 3: Mod assist Left Sidelying to Sit: 4: Min assist    Transfers  Transfers: Sit to Stand;Stand to Sit;Stand Pivot Transfers Sit to Stand: 3: Mod assist Sit to Stand: Patient Percentage: 60% Stand to Sit: 4: Min assist Stand Pivot Transfers: 1: +2 Total assist    Ambulation / Gait / Stairs / Wheelchair Mobility  Ambulation/Gait Ambulation/Gait Assistance: 3: Mod assist Ambulation Distance (Feet): 20 Feet Assistive device: Rolling walker Ambulation/Gait Assistance Details: 20' x 2 with RW with mod A for RW control and safety as well as balance with obstacle negotaition and turns.  Pt with decreased safety awareness requiring cues to keep RW on floor when turning.  Pt with forward flexed posture requiring manual facilitaiton for upright trunki.  Shuffling gait, small step length    Posture / Balance Static Sitting Balance Static Sitting - Balance Support: Left upper extremity supported;Feet supported Static Sitting - Level of Assistance: 5: Stand by  assistance Static Sitting - Comment/# of Minutes: Lt. lean much less today, Lt. cervical flexion remains but flexible. Randomly pt loses balance laterally and posteriorly, falls with minimal correction needing max assist to avoid hitting head on bed rail.  Dynamic Sitting Balance Dynamic Sitting - Level of Assistance: 4: Min assist Dynamic Sitting - Balance Activities: Reaching for objects Dynamic Sitting - Comments: Practiced leaning down to bil. elbows and control to midline positioning. Difficult for pt. Facilitation of anterior pelvic tilt improves balance but pt immediately reverts back to posterior pelvic tilt when balance lost.     Special needs/care consideration Bowel mgmt: incontinent Bladder mgmt: incontinent   Previous Home Environment Living Arrangements: Spouse/significant other  Lives With: Spouse Available Help at Discharge: Available 24 hours/day;Other (Comment) (wife to inquire into FMLA; teacher) Type of Home: House Home Layout: Two level;Able to live on main level with bedroom/bathroom Alternate Level Stairs-Rails: Left Alternate Level Stairs-Number of Steps: 15 Home Access: Stairs to enter Entrance Stairs-Rails: Left Entrance Stairs-Number of Steps: 7-14 (pt not the best historian) Bathroom Shower/Tub: Tub/shower unit;Walk-in shower Bathroom Toilet: Standard Bathroom Accessibility: Yes How Accessible: Accessible via walker Home Care Services: No Additional Comments: wife plans to take fmla  Discharge Living  Setting Plans for Discharge Living Setting: Patient's home;Lives with (comment) (wife) Type of Home at Discharge: House Discharge Home Layout: Two level;Able to live on main level with bedroom/bathroom Alternate Level Stairs-Rails: Left Alternate Level Stairs-Number of Steps: 15 Discharge Home Access: Stairs to enter Discharge Bathroom Shower/Tub: Tub/shower unit;Walk-in shower Discharge Bathroom Toilet: Standard Discharge Bathroom Accessibility: Yes How  Accessible: Accessible via walker Does the patient have any problems obtaining your medications?: No  Social/Family/Support Systems Patient Roles: Spouse Contact Information: Thornton Papas, spouse Anticipated Caregiver: wife to take fmla Anticipated Caregiver's Contact Information: see above Ability/Limitations of Caregiver: no limitations Caregiver Availability: 24/7 Discharge Plan Discussed with Primary Caregiver: Yes Is Caregiver In Agreement with Plan?: Yes Does Caregiver/Family have Issues with Lodging/Transportation while Pt is in Rehab?: No    Goals/Additional Needs Patient/Family Goal for Rehab: Min to mod assist with PT and OT, supervision with SLP Expected length of stay: [facility] 12 to 16 days Dietary Needs: Dysphagia 3 diet with nectar thick liquids Pt/Family Agrees to Admission and willing to participate: Yes Program Orientation Provided & Reviewed with Pt/Caregiver Including Roles  & Responsibilities: Yes Information Needs to be Provided By: Pt's sister from [place] here now for a few days to assist   Decrease burden of Care through IP rehab admission: n/a  Possible need for SNF placement upon discharge: I discussed with wife and she states she will take FMLA to provide 24/7 assist at home.   Patient Condition: This patient's condition remains as documented in the consult dated 01/19/13, in which the Rehabilitation Physician determined and documented that the patient's condition is appropriate for intensive rehabilitative care in an inpatient rehabilitation facility. Will admit to inpatient rehab today.  Preadmission Screen Completed By:  Clois Dupes, 01/20/2013 12:04 PM ______________________________________________________________________   Discussed status with Dr. Riley Kill on 01/20/13 at  1214 and received telephone approval for admission today.  Admission Coordinator:  Clois Dupes, time 1610 Date 01/20/2013.

## 2013-01-20 NOTE — Progress Notes (Signed)
Received pt. As a transfer from [facility]pt. Alert and oriented, from home with wife.pt. Has an extensive rash on his back.Also redness on his Rt. Great toe.Pt. Uses a cane and walker at home.keep monitoring pt.closely and assessing his needs.

## 2013-01-20 NOTE — H&P (Signed)
Physical Medicine and Rehabilitation Admission H&P  Chief Complaint   Patient presents with   .  Weakness   :  Chief complaint: Diffuse weakness  HPI: Rodney Potts is a 67 y.o. 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 Patient ambulated with a cane prior to admission. Admitted 01/15/2013 with diffuse generalized weakness and falls. Patient was found down by his family after extended time.  Cranial CT scan x2 negative. MRI of the brain 01/16/2013 showing stable changes of right posterior frontal and anterior temporal encephalomalacia remote a small right thalamic and left basal ganglia lacunar infarcts and extensive periventricular subcortical nonspecific white matter hyperintensities related to small vessel disease. Overall no significant changes compared to prior MRI scan 06/08/2012.  MRI cervical thoracic spine unremarkable. No further workup indicated as per neurology services. Maintained on a dysphagia 3 nectar thick liquid diet. Physical and occupational therapy evaluations completed and ongoing recommendations for physical medicine rehabilitation consult to consider inpatient rehabilitation services. Patient was felt to be a good candidate for inpatient rehabilitation services and was admitted for a comprehensive rehabilitation program  ROS Review of Systems  Genitourinary: Positive for urgency.  Musculoskeletal: Positive for falls and myalgias. Right big toe pain  Neurological: Positive for seizures.  Psychiatric/Behavioral: Positive for memory loss.  All other systems reviewed and are negative  Past Medical History   Diagnosis  Date   .  Brain tumor      glioma; S/P radiation and chemotherapy (01/14/2013)   .  TIA (transient ischemic attack)  ~ 2011   .  IBS (irritable bowel syndrome)    .  Shingles    .  CVA (cerebral infarction)      R thalamic   .  Allergy    .   Complication of anesthesia      "after knee OR woke up very wild; pulling things out, etc" (01/14/2013)   .  High cholesterol    .  Sleep apnea      "corrected w/OR" (01/14/2013)   .  Seizures      "hx from the scar tissue where brain tumor was; on RX to control" (01/14/2013)   .  Stroke      "he's had a couple; still a little weaker on the left leg, drags that foot" (01/14/2013)   .  Prostate cancer     Past Surgical History   Procedure  Laterality  Date   .  Cataract extraction w/ intraocular lens implant  Bilateral  2000's     "don't know if they put lens in" (01/14/2013)   .  Joint replacement     .  Prostatectomy   1990's   .  Uvulopalatopharyngoplasty   1980's     "for sleep apnea" (01/14/2013)   .  Shoulder arthroscopy  Right  2000's   .  Total knee arthroplasty  Bilateral  2000's    Family History   Problem  Relation  Age of Onset   .  Hypertension  Father    .  Transient ischemic attack  Mother    .  Cancer  Mother    Social History: reports that he has quit smoking. His smoking use included Cigarettes. He has a .48 pack-year smoking history. He has never used smokeless tobacco. He reports that he drinks about 4.2 ounces of alcohol per week. He reports that he does not use illicit drugs.  Allergies:  Allergies   Allergen  Reactions   .  Amoxicillin  Diarrhea   .  Dilaudid [Hydromorphone Hcl]  Other (See Comments)     DELUSIONS   .  Lactose Intolerance (Gi)  Diarrhea   .  Sulfa Antibiotics  Other (See Comments)     Cant control bladder    Medications Prior to Admission   Medication  Sig  Dispense  Refill   .  atorvastatin (LIPITOR) 40 MG tablet  Take 40 mg by mouth at bedtime.     .  cetirizine (ZYRTEC) 10 MG tablet  Take 10 mg by mouth daily.     .  clopidogrel (PLAVIX) 75 MG tablet  Take 75 mg by mouth at bedtime.     .  donepezil (ARICEPT) 5 MG tablet  Take 1 tablet (5 mg total) by mouth at bedtime.  90 tablet  3   .  fesoterodine (TOVIAZ) 8 MG TB24  Take 8 mg  by mouth daily.     .  fluticasone (FLONASE) 50 MCG/ACT nasal spray  Place 2 sprays into both nostrils daily.     Marland Kitchen  levETIRAcetam (KEPPRA) 500 MG tablet  Take 500-1,000 mg by mouth every 12 (twelve) hours. Take 500mg  in the am & 1000mg  in the pm     .  PHENobarbital (LUMINAL) 97.2 MG tablet  Take 1 tablet (97.2 mg total) by mouth at bedtime.  90 tablet  3   Home:  Home Living  Family/patient expects to be discharged to:: Private residence  Living Arrangements: Spouse/significant other  Available Help at Discharge: Family;Available PRN/intermittently  Type of Home: House  Home Access: Stairs to enter  [employer] of Steps: 7-14 (pt not the best historian)  Entrance Stairs-Rails: Left  Home Layout: Two level;Able to live on main level with bedroom/bathroom  Alternate Level Stairs-Number of Steps: 15  Alternate Level Stairs-Rails: Left  Home Equipment: Cane - single point;Walker - 2 wheels;Bedside commode;Shower seat  Additional Comments: Wife works during day.  Lives With: Spouse  Functional History:  Prior Function  Vocation: Unemployed  Comments: Pt reports he was still ambulatory but having more falls than normal. A few a week  Functional Status:  Mobility:  Bed Mobility  Bed Mobility: Rolling Right;Right Sidelying to Sit  Rolling Right: 3: Mod assist  Rolling Left: 4: Min assist  Right Sidelying to Sit: 3: Mod assist  Left Sidelying to Sit: 4: Min assist  Transfers  Transfers: Sit to Stand;Stand to Sit;Stand Pivot Transfers  Sit to Stand: 3: Mod assist  Sit to Stand: Patient Percentage: 60%  Stand to Sit: 4: Min assist  Stand Pivot Transfers: 1: +2 Total assist  Ambulation/Gait  Ambulation/Gait Assistance: 3: Mod assist  Ambulation Distance (Feet): 20 Feet  Assistive device: Rolling walker  Ambulation/Gait Assistance Details: 20' x 2 with RW with mod A for RW control and safety as well as balance with obstacle negotaition and turns. Pt with decreased safety  awareness requiring cues to keep RW on floor when turning. Pt with forward flexed posture requiring manual facilitaiton for upright trunki. Shuffling gait, small step length  ADL:  ADL  Eating/Feeding: Performed;Set up;Minimal assistance  Where Assessed - Eating/Feeding: Chair  Grooming: Performed;Teeth care;Minimal assistance  Where Assessed - Grooming: Supported sitting  Upper Body Bathing: Simulated;Minimal assistance  Where Assessed - Upper Body Bathing: Supported sitting  Lower Body Bathing: Performed;Moderate assistance  Where Assessed - Lower Body Bathing: Supported sit to stand  Upper Body Dressing: Performed;Minimal assistance  Where Assessed - Upper Body Dressing: Supported sitting  Lower Body Dressing: Performed;Moderate assistance  Where Assessed - Lower Body Dressing: Supported sit to Scientist, research (life sciences): Performed;Moderate assistance  Toilet Transfer Method: Stand pivot  Toilet Transfer Equipment: Bedside commode  Equipment Used: Rolling walker  Transfers/Ambulation Related to ADLs: Pt unsteady with transfers and had difficulty with weight shifting from R to L to pivot.  ADL Comments: Pt needs more assist with LE adls due to decreased balance in standing.  Cognition:  Cognition  Overall Cognitive Status: No family/caregiver present to determine baseline cognitive functioning  Arousal/Alertness: Awake/alert  Orientation Level: Oriented X4  Attention: Sustained (likely deficits with higer level attention)  Sustained Attention: Appears intact  Memory: Impaired  Memory Impairment: Storage deficit;Retrieval deficit;Decreased recall of new information;Decreased short term memory;Prospective memory (uncertain of baseline memory)  Decreased Short Term Memory: Verbal basic  Awareness: Impaired  Awareness Impairment: Emergent impairment;Anticipatory impairment  Problem Solving: Impaired  Problem Solving Impairment: Verbal basic  Executive Function: Organizing;Self  Monitoring;Self Correcting;Sequencing  Safety/Judgment: Impaired  Cognition  Arousal/Alertness: Awake/alert  Behavior During Therapy: Flat affect  Overall Cognitive Status: No family/caregiver present to determine baseline cognitive functioning  Area of Impairment: Attention;Memory;Safety/judgement;Awareness;Problem solving  Orientation Level: Disoriented to;Place;Time;Situation  Current Attention Level: Selective  Memory: Decreased short-term memory  Following Commands: Follows one step commands consistently  Safety/Judgement: Decreased awareness of safety  Awareness: Emergent  Problem Solving: Slow processing;Decreased initiation;Difficulty sequencing;Requires verbal cues;Requires tactile cues  General Comments: Pt demonstrating much improvement from yesterday but still with signfiicant deficits as compared to baseline per wife  Physical Exam:  Blood pressure 128/80, pulse 68, temperature 99.4 F (37.4 C), temperature source Oral, resp. rate 18, height 5\' 9"  (1.753 m), weight 80.06 kg (176 lb 8 oz), SpO2 97.00%.  HENT:  Head: Normocephalic.  Eyes: EOM are normal.  Neck: Normal range of motion. Neck supple. No thyromegaly present.  Cardiovascular: Normal rate and regular rhythm.  Respiratory: Effort normal and breath sounds normal. No respiratory distress.  GI: Soft. Bowel sounds are normal. He exhibits no distension.  Neurological: He is alert.  Patient is pleasantly more oriented today. He was able to provide his name age date of birth. He did follow simple commands. Basic insight and awareness only. Strength 3/5 shoulder girdle to 4/5 at elbow and wrist. Hip flexors 3-, KE 3, ankles 3+ to 4/5 except where pain inhibits. No gross sensory changes.  Musc: right great toe/MCP red, warm, and tender  Skin: Skin is warm and dry.  Psychiatric: He has a normal mood and affect  No results found for this or any previous visit (from the past 48 hour(s)).  No results found.  Post Admission  Physician Evaluation:  1. Functional deficits secondary to rhabdomyolysis, deconditioning. Pt has a hx of brain glioma. 2. Patient is admitted to receive collaborative, interdisciplinary care between the physiatrist, rehab nursing staff, and therapy team. 3. Patient's level of medical complexity and substantial therapy needs in context of that medical necessity cannot be provided at a lesser intensity of care such as a SNF. 4. Patient has experienced substantial functional loss from his/her baseline which was documented above under the "Functional History" and "Functional Status" headings. Judging by the patient's diagnosis, physical exam, and functional history, the patient has potential for functional progress which will result in measurable gains while on inpatient rehab. These gains will be of substantial and practical use upon discharge in facilitating mobility and self-care at the household level. 5. Physiatrist will provide  24 hour management of medical needs as well as oversight of the therapy plan/treatment and provide guidance as appropriate regarding the interaction of the two. 6. 24 hour rehab nursing will assist with bladder management, bowel management, safety, skin/wound care, disease management, medication administration, pain management and patient education and help integrate therapy concepts, techniques,education, etc. 7. PT will assess and treat for/with: Lower extremity strength, range of motion, stamina, balance, functional mobility, safety, adaptive techniques and equipment, pain mgt, NMR. Goals are: supervision to min assist. 8. OT will assess and treat for/with: ADL's, functional mobility, safety, upper extremity strength, adaptive techniques and equipment, pain mgt, NMR. Goals are: supervision to min assist. 9. SLP will assess and treat for/with: cognition, swallowing . Goals are: supervision to mod I. 10. Case Management and Social Worker will assess and treat for psychological  issues and discharge planning. 11. Team conference will be held weekly to assess progress toward goals and to determine barriers to discharge. 12. Patient will receive at least 3 hours of therapy per day at least 5 days per week. 13. ELOS: 12-16 days  14. Prognosis: excellent Medical Problem List and Plan:  1. Rhabdomyolysis, deconditioning, history of brain glioma, and CVA  2. DVT Prophylaxis/Anticoagulation: SCDs. Monitor for any signs of DVT  3. Pain Management: Tylenol as needed. Monitor with increased mobility  4. Neuropsych: This patient is not capable of making decisions on his own behalf.  5. Dysphagia. Dysphagia 3 nectar thick liquids. Monitor for any signs of aspiration. Followup speech therapy  6. Seizure disorder. Keppra 750 mg twice a day, phenobarbital(luminal) 97.2 mg each bedtime. Monitor for any seizure activity  7. Mood. Aricept 5 mg each bedtime. Discuss cognitive baseline with family  8. Overactive bladder. Toviaz 8 mg each bedtime as prior to admission. Check bladder scans x3  9. Hyperlipidemia. Resume Lipitor  10. Gout, 1st right MTP----prednisone, colchicine for flare  Ranelle Oyster, MD, [facility] Physical Medicine & Rehabilitation  01/20/2013

## 2013-01-20 NOTE — Progress Notes (Signed)
I await insurance approval to admit pt to inpt rehab today. [phone] 

## 2013-01-21 ENCOUNTER — Inpatient Hospital Stay (HOSPITAL_COMMUNITY): Payer: Medicare Other

## 2013-01-21 ENCOUNTER — Inpatient Hospital Stay (HOSPITAL_COMMUNITY): Payer: Medicare Other | Admitting: Speech Pathology

## 2013-01-21 ENCOUNTER — Encounter (HOSPITAL_COMMUNITY): Payer: Self-pay | Admitting: Rehabilitation

## 2013-01-21 ENCOUNTER — Inpatient Hospital Stay (HOSPITAL_COMMUNITY): Payer: Medicare Other | Admitting: Physical Therapy

## 2013-01-21 DIAGNOSIS — M6282 Rhabdomyolysis: Secondary | ICD-10-CM

## 2013-01-21 DIAGNOSIS — R5381 Other malaise: Secondary | ICD-10-CM

## 2013-01-21 LAB — COMPREHENSIVE METABOLIC PANEL
ALT: 23 U/L (ref 0–53)
AST: 28 U/L (ref 0–37)
Albumin: 3 g/dL — ABNORMAL LOW (ref 3.5–5.2)
Alkaline Phosphatase: 51 U/L (ref 39–117)
CO2: 29 mEq/L (ref 19–32)
Calcium: 8.9 mg/dL (ref 8.4–10.5)
Chloride: 101 mEq/L (ref 96–112)
GFR calc non Af Amer: 86 mL/min — ABNORMAL LOW (ref >90)
Potassium: 4.1 mEq/L (ref 3.5–5.1)
Sodium: 139 mEq/L (ref 135–145)

## 2013-01-21 LAB — CULTURE, BLOOD (ROUTINE X 2): Culture: NO GROWTH

## 2013-01-21 LAB — CBC WITH DIFFERENTIAL/PLATELET
Basophils Absolute: 0 10*3/uL (ref 0.0–0.1)
Basophils Relative: 0 % (ref 0–1)
Eosinophils Relative: 0 % (ref 0–5)
Lymphocytes Relative: 11 % — ABNORMAL LOW (ref 12–46)
Lymphs Abs: 1.1 10*3/uL (ref 0.7–4.0)
Neutro Abs: 8 10*3/uL — ABNORMAL HIGH (ref 1.7–7.7)
Neutrophils Relative %: 78 % — ABNORMAL HIGH (ref 43–77)
Platelets: 292 10*3/uL (ref 150–400)
RBC: 3.59 MIL/uL — ABNORMAL LOW (ref 4.22–5.81)
RDW: 13.3 % (ref 11.5–15.5)
WBC: 10.1 10*3/uL (ref 4.0–10.5)

## 2013-01-21 MED ORDER — COLCHICINE 0.6 MG PO TABS
0.6000 mg | ORAL_TABLET | Freq: Every day | ORAL | Status: DC
  Administered 2013-01-21 – 2013-01-26 (×6): 0.6 mg via ORAL
  Filled 2013-01-21 (×9): qty 1

## 2013-01-21 MED ORDER — PNEUMOCOCCAL VAC POLYVALENT 25 MCG/0.5ML IJ INJ
0.5000 mL | INJECTION | INTRAMUSCULAR | Status: AC
  Administered 2013-01-22: 0.5 mL via INTRAMUSCULAR
  Filled 2013-01-21: qty 0.5

## 2013-01-21 MED ORDER — INFLUENZA VAC SPLIT QUAD 0.5 ML IM SUSP
0.5000 mL | INTRAMUSCULAR | Status: AC
  Administered 2013-01-22: 0.5 mL via INTRAMUSCULAR
  Filled 2013-01-21: qty 0.5

## 2013-01-21 NOTE — Progress Notes (Signed)
Physical Therapy Note  Patient Details  Name: Rodney Potts MRN: [id number] Date of Birth: [birth year] Today's Date: 01/21/2013  1300-1325 (25 minutes) individual Pain: no complaint of pain Focus of treatment: transfer training; therapeutic exercise focused on bilateral LE strengthening/ activity tolerance Treatment: Pt up in wc upon arrival; sit to stand from wc mod assist with max vcs for hand placement; transfer stand /turn RW mod assist with vcs to stay closer to AD; Nustep Level 4 X 10 minutes ; wc mobility - 50 feet min assist/ increased time and mod/max instructional cues for steering using bilateral UEs; returned to room with quick release belt in place.    Arnetra Terris,JIM 01/21/2013, 1:08 PM

## 2013-01-21 NOTE — Progress Notes (Signed)
Subjective/Complaints: Had a reasonable night. Right first toe feels better. Breathing comfortably A 12 point review of systems has been performed and if not noted above is otherwise negative.   Objective: Vital Signs: Blood pressure 125/77, pulse 74, temperature 98.1 F (36.7 C), temperature source Oral, resp. rate 16, height 5\' 9"  (1.753 m), weight 80.7 kg (177 lb 14.6 oz), SpO2 95.00%. No results found.  Recent Labs  01/21/13 0700  WBC 10.1  HGB 10.8*  HCT 32.4*  PLT 292   No results found for this basename: NA, K, CL, CO, GLUCOSE, BUN, CREATININE, CALCIUM,  in the last 72 hours CBG (last 3)  No results found for this basename: GLUCAP,  in the last 72 hours  Wt Readings from Last 3 Encounters:  01/20/13 80.7 kg (177 lb 14.6 oz)  01/14/13 80.06 kg (176 lb 8 oz)  01/12/13 81.647 kg (180 lb)    Physical Exam:  HENT:  Head: Normocephalic.  Eyes: EOM are normal.  Neck: Normal range of motion. Neck supple. No thyromegaly present.  Cardiovascular: Normal rate and regular rhythm.  Respiratory: Effort normal and breath sounds normal. No respiratory distress.  GI: Soft. Bowel sounds are normal. He exhibits no distension.  Neurological: He is alert.  Patient is oriented today to place and person. He was able to provide his name age date of birth. He did follow simple commands. Basic insight and awareness only. Strength 3+/5 shoulder girdle to 4/5 at elbow and wrist. Hip flexors 3-, KE 3, ankles 3+ to 4/5 except where pain inhibits. No gross sensory changes.  Musc: right great toe/MCP less red, warm, and tender to palpation Skin: Skin is warm and dry.  Psychiatric: He has a normal mood and affect    Assessment/Plan: 1. Functional deficits secondary to rhabdomyolysis and deconditioning. (hx of brain glioma) which require 3+ hours per day of interdisciplinary therapy in a comprehensive inpatient rehab setting. Physiatrist is providing close team supervision and 24 hour  management of active medical problems listed below. Physiatrist and rehab team continue to assess barriers to discharge/monitor patient progress toward functional and medical goals. FIM:       FIM - Toileting Toileting steps completed by patient: Adjust clothing prior to toileting;Performs perineal hygiene Toileting Assistive Devices: Grab bar or rail for support Toileting: 4: Steadying assist           Comprehension Comprehension: 5-Understands basic 90% of the time/requires cueing < 10% of the time  Expression Expression Mode: Verbal Expression: 5-Expresses basic needs/ideas: With extra time/assistive device  Social Interaction Social Interaction: 5-Interacts appropriately 90% of the time - Needs monitoring or encouragement for participation or interaction.  Problem Solving Problem Solving: 5-Solves basic 90% of the time/requires cueing < 10% of the time  Memory Memory: 5-Recognizes or recalls 90% of the time/requires cueing < 10% of the time  Medical Problem List and Plan:  1. Rhabdomyolysis, deconditioning, history of brain glioma, and CVA  2. DVT Prophylaxis/Anticoagulation: SCDs. Monitor for any signs of DVT  3. Pain Management: Tylenol as needed. Monitor with increased mobility  4. Neuropsych: This patient is not capable of making decisions on his own behalf.  5. Dysphagia. Dysphagia 3 nectar thick liquids. Monitor for any signs of aspiration. Followup speech therapy  6. Seizure disorder. Keppra 750 mg twice a day, phenobarbital(luminal) 97.2 mg each bedtime. Monitor for any seizure activity  7. Mood. Aricept 5 mg each bedtime. Discuss cognitive baseline with family  8. Overactive bladder. Toviaz 8 mg each bedtime as  prior to admission. Check bladder scans x3  9. Hyperlipidemia. Resume Lipitor  10. Gout, 1st right MTP----prednisone, colchicine for flare---taper  -pain better today.  LOS (Days) 1 A FACE TO FACE EVALUATION WAS PERFORMED  Sarika Baldini  T 01/21/2013 7:50 AM

## 2013-01-21 NOTE — Progress Notes (Signed)
Patient information reviewed and entered into eRehab system by Sania Noy, RN, CRRN, PPS Coordinator.  Information including medical coding and functional independence measure will be reviewed and updated through discharge.     Per nursing patient was given "Data Collection Information Summary for Patients in Inpatient Rehabilitation Facilities with attached "Privacy Act Statement-Health Care Records" upon admission.  

## 2013-01-21 NOTE — Evaluation (Signed)
Physical Therapy Assessment and Plan  Patient Details  Name: Rodney Potts MRN: [id number] Date of Birth: 1945/12/19  PT Diagnosis: Abnormality of gait, Cognitive deficits, Difficulty walking and Muscle weakness Rehab Potential: Good ELOS: 10-14 days   Today's Date: 01/21/2013 Time: 0730-0825 Time Calculation (min): 55 min  Problem List:  Patient Active Problem List   Diagnosis Date Noted  . Physical deconditioning 01/21/2013  . Difficulty walking 01/15/2013  . Rhabdomyolysis 01/15/2013  . Abnormality of gait 07/02/2012  . Unspecified cerebral artery occlusion with cerebral infarction 07/02/2012  . Aphasia 07/02/2012  . Encounter for long-term (current) use of other medications 07/02/2012  . TIA (transient ischemic attack) 06/08/2012  . Ataxia 06/08/2012  . Slurred speech 06/09/2011  . Weakness generalized 06/09/2011  . CVA (cerebral infarction) 03/24/2011  . Seizures 03/24/2011  . OSA (obstructive sleep apnea) 03/24/2011  . IBS (irritable bowel syndrome) 03/24/2011  . 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 Brain tumor     glioma; S/P radiation and chemotherapy (01/14/2013)  . TIA (transient ischemic attack) ~ 2011  . IBS (irritable bowel syndrome)   . Shingles   . CVA (cerebral infarction)     R thalamic  . Allergy   . Complication of anesthesia     "after knee OR woke up very wild; pulling things out, etc" (01/14/2013)  . High cholesterol   . Sleep apnea     "corrected w/OR" (01/14/2013)  . Seizures     "hx from the scar tissue where brain tumor was; on RX to control" (01/14/2013)  . Stroke     "he's had a couple; still a little weaker on the left leg, drags that foot" (01/14/2013)  . Prostate cancer    Past Surgical History:  Past Surgical History  Procedure Laterality Date  . Cataract extraction w/ intraocular lens implant Bilateral 2000's    "don't know if they put lens in" (01/14/2013)  . Joint  replacement    . Prostatectomy  1990's  . Uvulopalatopharyngoplasty  1980's    "for sleep apnea" (01/14/2013)  . Shoulder arthroscopy Right 2000's  . Total knee arthroplasty Bilateral 2000's    Assessment & Plan Clinical Impression: Patient is a 67 y.o. year old male with recent admission to the hospital on 01/15/2013 with diffuse generalized weakness and falls. Patient was found down by his family after extended time. Cranial CT scan x2 negative. MRI of the brain 01/16/2013 showing stable changes of right posterior frontal and anterior temporal encephalomalacia remote a small right thalamic and left basal ganglia lacunar infarcts and extensive periventricular subcortical nonspecific white matter hyperintensities related to small vessel disease.  Patient transferred to CIR on 01/20/2013 .   Patient currently requires mod with mobility secondary to muscle weakness, decreased motor planning, decreased awareness, decreased safety awareness and delayed processing and decreased standing balance, decreased postural control and decreased balance strategies.  Prior to hospitalization, patient was modified independent  with mobility and lived with Spouse in a House home.  Home access is 3-4Stairs to enter.  Patient will benefit from skilled PT intervention to maximize safe functional mobility, minimize fall risk and decrease caregiver burden for planned discharge home with 24 hour supervision.  Anticipate patient will benefit from follow up OP at discharge.  PT - End of Session Activity Tolerance: Tolerates 30+ min activity with multiple rests Endurance Deficit: Yes PT Assessment Rehab Potential: Good Barriers to Discharge: Decreased caregiver support;Inaccessible home environment  Barriers to Discharge Comments: pt has full flight of steps, wife works PT Patient demonstrates impairments in the following area(s): Balance;Endurance;Motor;Safety PT Transfers Functional Problem(s): Bed Mobility;Bed to  Chair;Car;Furniture PT Locomotion Functional Problem(s): Ambulation;Stairs PT Plan PT Intensity: Minimum of 1-2 x/day ,45 to 90 minutes PT Frequency: 5 out of 7 days PT Duration Estimated Length of Stay: 10-14 days PT Treatment/Interventions: Ambulation/gait training;Discharge planning;Functional mobility training;Therapeutic Activities;Therapeutic Exercise;Wheelchair propulsion/positioning;Neuromuscular re-education;Balance/vestibular training;Cognitive remediation/compensation;DME/adaptive equipment instruction;Pain management;Splinting/orthotics;UE/LE Strength taining/ROM;UE/LE Risk manager training;Patient/family education;Community reintegration PT Transfers Anticipated Outcome(s): supervision PT Locomotion Anticipated Outcome(s): supervision PT Recommendation Follow Up Recommendations: Outpatient PT Patient destination: Home Equipment Recommended: To be determined  Skilled Therapeutic Intervention Standing balance training without AD with focus on anterior wt shift.  Pt requires max manual facilitation for reaching and wt shifting while maintaining wt anteriorly.  Sit to stand training focus on anterior wt shift with multiple attempts, pt progressed from max to min A.  Gait training multiple attempts with RW with cues for posture and staying close to RW.  PT Evaluation Precautions/Restrictions Precautions Precautions: Fall Restrictions Weight Bearing Restrictions: No Pain Pain Assessment Pain Assessment: No/denies pain while seated.  Pt c/o pain in B feet with standing, states he usually wears orthotics Home Living/Prior Functioning Home Living Available Help at Discharge: Family;Available PRN/intermittently Type of Home: House Home Access: Stairs to enter [employer] of Steps: 3-4 Entrance Stairs-Rails: Left Home Layout: Two level Alternate Level Stairs-Number of Steps: flight Alternate Level Stairs-Rails: Left  Lives With: Spouse Prior  Function Level of Independence: Requires assistive device for independence  Able to Take Stairs?: Yes Comments: uses a SPC or RW  Cognition Overall Cognitive Status: Impaired/Different from baseline Arousal/Alertness: Awake/alert Sustained Attention: Appears intact Awareness: Impaired Awareness Impairment: Emergent impairment Safety/Judgment: Impaired Sensation Sensation Light Touch: Appears Intact Proprioception: Appears Intact Coordination Gross Motor Movements are Fluid and Coordinated: Yes Motor  Motor Motor - Skilled Clinical Observations: generalized weakness  Mobility Bed Mobility Bed Mobility: Supine to Sit Supine to Sit: 4: Min assist Supine to Sit Details (indicate cue type and reason): pt requires assist to move LEs off of bed Transfers Sit to Stand: 3: Mod assist Sit to Stand Details (indicate cue type and reason): Pt requires manual facilitaiton for forward wt shift, lifting assistance, states this is more difficult due to pain in feet because he doesn't have his orthotics Stand Pivot Transfers: 3: Mod assist Stand Pivot Transfer Details (indicate cue type and reason): assist for wt shifts, UE placement Locomotion  Ambulation Ambulation: Yes Ambulation/Gait Assistance: 4: Min assist Ambulation Distance (Feet): 40 Feet Assistive device: Rolling walker Ambulation/Gait Assistance Details: cues to stay close to RW, pt with forward flexed head and trunk.  pt walks on heels ? due to toe pain Stairs / Additional Locomotion Stairs: Yes Stairs Assistance: 4: Min assist Stair Management Technique: Two Psychologist, sport and exercise: Yes Wheelchair Assistance: 5: Investment banker, operational: Both upper extremities Wheelchair Parts Management: Needs assistance Distance: 40  Trunk/Postural Assessment  Cervical Assessment Cervical Assessment:  (forward flexed) Thoracic Assessment Thoracic Assessment:  (R lateral flexion) Lumbar  Assessment Lumbar Assessment:  (posterior tilt) Postural Control Postural Control: Deficits on evaluation Righting Reactions: delayed  Balance Dynamic Standing Balance Dynamic Standing - Level of Assistance: 2: Max assist Dynamic Standing - Comments: without AD due to posterior lean, max manual facilitation to correct Extremity Assessment      RLE Assessment RLE Assessment:  (grossly 3/5) LLE Assessment LLE Assessment:  (grossly 3/5)  FIM:  FIM -  Bed/Chair Transfer Bed/Chair Transfer: 3: Chair or W/C > Bed: Mod A (lift or lower assist);3: Bed > Chair or W/C: Mod A (lift or lower assist) FIM - Locomotion: Wheelchair Distance: 40 Locomotion: Wheelchair: 1: Travels less than 50 ft with supervision, cueing or coaxing FIM - Locomotion: Ambulation Ambulation/Gait Assistance: 4: Min assist Locomotion: Ambulation: 1: Travels less than 50 ft with minimal assistance (Pt.>75%) FIM - Locomotion: Stairs Locomotion: Stairs: 2: Up and Down 4 - 11 stairs with minimal assistance (Pt.>75%)   Refer to Care Plan for Long Term Goals  Recommendations for other services: None  Discharge Criteria: Patient will be discharged from PT if patient refuses treatment 3 consecutive times without medical reason, if treatment goals not met, if there is a change in medical status, if patient makes no progress towards goals or if patient is discharged from hospital.  The above assessment, treatment plan, treatment alternatives and goals were discussed and mutually agreed upon: by patient  Cam Harnden 01/21/2013, 8:45 AM

## 2013-01-21 NOTE — Evaluation (Signed)
Speech Language Pathology Assessment and Plan  Patient Details  Name: Rodney Potts MRN: [id number] Date of Birth: 07-Jul-1945  SLP Diagnosis: Cognitive Impairments;Dysphagia  Rehab Potential: Excellent ELOS: 10-14 days   Today's Date: 01/21/2013 Time: 1110-1205 Time Calculation (min): 55 min  Skilled Therapeutic Intervention: Administered cognitive-linguistic evaluation and BSE. Please see below for details.   Problem List:  Patient Active Problem List   Diagnosis Date Noted  . Physical deconditioning 01/21/2013  . Difficulty walking 01/15/2013  . Rhabdomyolysis 01/15/2013  . Abnormality of gait 07/02/2012  . Unspecified cerebral artery occlusion with cerebral infarction 07/02/2012  . Aphasia 07/02/2012  . Encounter for long-term (current) use of other medications 07/02/2012  . TIA (transient ischemic attack) 06/08/2012  . Ataxia 06/08/2012  . Slurred speech 06/09/2011  . Weakness generalized 06/09/2011  . CVA (cerebral infarction) 03/24/2011  . Seizures 03/24/2011  . OSA (obstructive sleep apnea) 03/24/2011  . IBS (irritable bowel syndrome) 03/24/2011  . 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 Brain tumor     glioma; S/P radiation and chemotherapy (01/14/2013)  . TIA (transient ischemic attack) ~ 2011  . IBS (irritable bowel syndrome)   . Shingles   . CVA (cerebral infarction)     R thalamic  . Allergy   . Complication of anesthesia     "after knee OR woke up very wild; pulling things out, etc" (01/14/2013)  . High cholesterol   . Sleep apnea     "corrected w/OR" (01/14/2013)  . Seizures     "hx from the scar tissue where brain tumor was; on RX to control" (01/14/2013)  . Stroke     "he's had a couple; still a little weaker on the left leg, drags that foot" (01/14/2013)  . Prostate cancer    Past Surgical History:  Past Surgical History  Procedure Laterality Date  . Cataract extraction w/ intraocular  lens implant Bilateral 2000's    "don't know if they put lens in" (01/14/2013)  . Joint replacement    . Prostatectomy  1990's  . Uvulopalatopharyngoplasty  1980's    "for sleep apnea" (01/14/2013)  . Shoulder arthroscopy Right 2000's  . Total knee arthroplasty Bilateral 2000's    Assessment / Plan / Recommendation Clinical Impression  Patient is a 67 y.o. year old male with recent admission to the hospital on 01/15/2013 with diffuse generalized weakness and falls. Patient was found down by his family after extended time. Cranial CT scan x2 negative. MRI of the brain 01/16/2013 showing stable changes of right posterior frontal and anterior temporal encephalomalacia and small right thalamic and left basal ganglia lacunar infarcts and extensive periventricular subcortical nonspecific white matter hyperintensities related to small vessel disease. Patient transferred to CIR on 01/20/2013 and presents with cognitive impairments characterized by decreased problem solving, awareness, working memory and safety awareness with delayed processing which impact pt's ability to perform functional tasks safely.  Pt had MBS on 01/16/13 which showed mild-moderate oropharyngeal dysphagia and pt was recommended Dys. 3 textures with nectar-thick liquids with multiple swallows and chin tuck. During BSE, pt did not demonstrate any overt s/s of aspiration but demonstrated prolonged mastication and AP transit with all boluses as well as a suspected delayed swallow initiation with decreased hyolaryngeal elevation. Pt also required Mod verbal cues for utilization of chin tuck and multiple swallows with trials. Recommend to continue current diet. Pt would benefit from skilled SLP intervention to maximize cognitive and swallowing function  and overall functional independence.     SLP Assessment  Patient will need skilled Speech Lanaguage Pathology Services during CIR admission    Recommendations  Diet Recommendations:  Dysphagia 3 (Mechanical Soft);Nectar-thick liquid Liquid Administration via: Cup;No straw Medication Administration: Whole meds with puree Supervision: Patient able to self feed;Full supervision/cueing for compensatory strategies Compensations: Slow rate;Small sips/bites;Multiple dry swallows after each bite/sip Postural Changes and/or Swallow Maneuvers: Seated upright 90 degrees;Chin tuck Oral Care Recommendations: Oral care BID Patient destination: Home Follow up Recommendations: Home Health SLP;24 hour supervision/assistance Equipment Recommended: None recommended by SLP    SLP Frequency 5 out of 7 days   SLP Treatment/Interventions Cognitive remediation/compensation;Cueing hierarchy;Dysphagia/aspiration precaution training;Functional tasks;Environmental controls;Speech/Language facilitation;Patient/family education;Therapeutic Activities;Internal/external aids;Therapeutic Exercise    Pain No/Denies Pain  Short Term Goals: Week 1: SLP Short Term Goal 1 (Week 1): Pt will utilize swallowing compensatory straegies to minimize overt s/s of aspiration with Min A verbal and question cues.  SLP Short Term Goal 2 (Week 1): Pt will identify 2 physical and 2 cognitive deficits with Mod A multimodal cueing SLP Short Term Goal 3 (Week 1): Pt will utilize call bell to express wants/needs with Min A question cues.  SLP Short Term Goal 4 (Week 1): Pt will demonstrate functional problem solving for basic and familiar tasks with Mod A mulltimodal cueing  SLP Short Term Goal 5 (Week 1): Pt will utilize external memory aids to increase recall new, daily information with Min A multimodal cueing.   See FIM for current functional status Refer to Care Plan for Long Term Goals  Recommendations for other services: Neuropsych  Discharge Criteria: Patient will be discharged from SLP if patient refuses treatment 3 consecutive times without medical reason, if treatment goals not met, if there is a change in  medical status, if patient makes no progress towards goals or if patient is discharged from hospital.  The above assessment, treatment plan, treatment alternatives and goals were discussed and mutually agreed upon: by patient  Aniela Caniglia 01/21/2013, 3:58 PM

## 2013-01-21 NOTE — Evaluation (Signed)
Occupational Therapy Assessment and Plan  Patient Details  Name: Rodney Potts MRN: [id number] Date of Birth: September 12, 1945  OT Diagnosis: acute pain, cognitive deficits and muscle weakness (generalized) Rehab Potential: Rehab Potential: Good ELOS: 14-21 days   Today's Date: 01/21/2013 Time: 0930-1030 Time Calculation (min): 60 min  Problem List:  Patient Active Problem List   Diagnosis Date Noted  . Physical deconditioning 01/21/2013  . Difficulty walking 01/15/2013  . Rhabdomyolysis 01/15/2013  . Abnormality of gait 07/02/2012  . Unspecified cerebral artery occlusion with cerebral infarction 07/02/2012  . Aphasia 07/02/2012  . Encounter for long-term (current) use of other medications 07/02/2012  . TIA (transient ischemic attack) 06/08/2012  . Ataxia 06/08/2012  . Slurred speech 06/09/2011  . Weakness generalized 06/09/2011  . CVA (cerebral infarction) 03/24/2011  . Seizures 03/24/2011  . OSA (obstructive sleep apnea) 03/24/2011  . IBS (irritable bowel syndrome) 03/24/2011  . 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 Brain tumor     glioma; S/P radiation and chemotherapy (01/14/2013)  . TIA (transient ischemic attack) ~ 2011  . IBS (irritable bowel syndrome)   . Shingles   . CVA (cerebral infarction)     R thalamic  . Allergy   . Complication of anesthesia     "after knee OR woke up very wild; pulling things out, etc" (01/14/2013)  . High cholesterol   . Sleep apnea     "corrected w/OR" (01/14/2013)  . Seizures     "hx from the scar tissue where brain tumor was; on RX to control" (01/14/2013)  . Stroke     "he's had a couple; still a little weaker on the left leg, drags that foot" (01/14/2013)  . Prostate cancer    Past Surgical History:  Past Surgical History  Procedure Laterality Date  . Cataract extraction w/ intraocular lens implant Bilateral 2000's    "don't know if they put lens in" (01/14/2013)  .  Joint replacement    . Prostatectomy  1990's  . Uvulopalatopharyngoplasty  1980's    "for sleep apnea" (01/14/2013)  . Shoulder arthroscopy Right 2000's  . Total knee arthroplasty Bilateral 2000's    Assessment & Plan Clinical Impression: Patient is a 67 y.o. 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 Patient ambulated with a cane prior to admission. Admitted 01/15/2013 with diffuse generalized weakness and falls. Patient was found down by his family after extended time.   Cranial CT scan x2 negative. MRI of the brain 01/16/2013 showing stable changes of right posterior frontal and anterior temporal encephalomalacia remote a small right thalamic and left basal ganglia lacunar infarcts and extensive periventricular subcortical nonspecific white matter hyperintensities related to small vessel disease. Overall no significant changes compared to prior MRI scan 06/08/2012.   MRI cervical thoracic spine unremarkable. No further workup indicated as per neurology services. Maintained on a dysphagia 3 nectar thick liquid diet.    Patient transferred to CIR on 01/20/2013 .    Patient currently requires maximum  assist with basic self-care skills secondary to muscle weakness, decreased motor planning, decreased attention, decreased problem solving, decreased safety awareness and delayed processing.  Prior to hospitalization, patient could complete BADL with modified independence.  Patient will benefit from skilled intervention to increase independence with basic self-care skills prior to discharge home with care partner.  Anticipate patient will require  intermittent supervision and follow up home health.  OT - End of Session Activity Tolerance: Tolerates 10 - 20 min activity with multiple rests Endurance Deficit: Yes OT Assessment Rehab Potential: Good OT Patient demonstrates impairments in  the following area(s): Balance;Cognition;Endurance;Motor;Pain;Perception;Safety OT Basic ADL's Functional Problem(s): Eating;Grooming;Bathing;Dressing;Toileting OT Transfers Functional Problem(s): Toilet;Tub/Shower OT Plan OT Intensity: Minimum of 1-2 x/day, 45 to 90 minutes OT Frequency: 5 out of 7 days OT Duration/Estimated Length of Stay: 14-21 days OT Treatment/Interventions: Balance/vestibular training;Cognitive remediation/compensation;Discharge planning;DME/adaptive equipment instruction;Patient/family education;Pain management;Neuromuscular re-education;Functional mobility training;Self Care/advanced ADL retraining;Therapeutic Activities;Therapeutic Exercise;Wheelchair propulsion/positioning;UE/LE Coordination activities OT Self Feeding Anticipated Outcome(s): Supervision OT Basic Self-Care Anticipated Outcome(s): Min A OT Toileting Anticipated Outcome(s): Supervision OT Bathroom Transfers Anticipated Outcome(s): Supervision OT Recommendation Patient destination: Home Follow Up Recommendations: Home health OT Equipment Recommended: Wheelchair (measurements);Sliding board;3 in 1 bedside comode Equipment Details: Drop Arm commode?  Skilled Therapeutic Intervention 1:1 OT initial evaluation completed with treatment provided to emphasize transfer training to include introduction to use of sliding board, improved safety awareness, pain management, endurance, attention, and effective use of DME (drop arm commode).   Patient was unable to complete transfers required to perform ADL at shower level during assessment and he required extra time to process direction and manage acute foot pain (right worse than left).   Patient completed transfer from w/c to DA [facility] using sliding board, with mod assist to progress, verbal cues and tactile facilitation to place hands and advance feet, and total assist to place and remove sliding board.    OT Evaluation Precautions/Restrictions   Precautions Precautions: Fall Precaution Comments:  h/o falls Restrictions Weight Bearing Restrictions: No General Chart Reviewed: Yes Family/Caregiver Present: No  Pain Pain Assessment Pain Assessment: 0-10 Pain Score: 8  Pain Type: Acute pain (pt suspects gout) Pain Location: Foot Pain Orientation: Left;Right Pain Descriptors / Indicators: Burning Pain Intervention(s): Medication (See eMAR);Distraction;Repositioned  Home Living/Prior Functioning Home Living Family/patient expects to be discharged to:: Private residence Living Arrangements: Spouse/significant other Available Help at Discharge: Family;Available PRN/intermittently Type of Home: House Home Access: Stairs to enter [employer] of Steps: 3-4 Entrance Stairs-Rails: Left Home Layout: Two level Alternate Level Stairs-Number of Steps: flight Alternate Level Stairs-Rails: Left Additional Comments: wife plans to take fmla  Lives With: Spouse Prior Function Level of Independence: Requires assistive device for independence  Able to Take Stairs?: Yes Vocation: Unemployed Comments: uses a SPC or RW  Vision/Perception  Vision - History Baseline Vision: Wears glasses all the time Visual History: Cataracts Patient Visual Report: No change from baseline Vision - Assessment Eye Alignment: Within Functional Limits Ocular Range of Motion: Within Functional Limits Tracking/Visual Pursuits: Requires cues, head turns, or add eye shifts to track Visual Fields: No apparent deficits Perception Perception: Within Functional Limits Praxis Praxis: Impaired Praxis Impairment Details: Motor planning   Cognition Overall Cognitive Status: Impaired/Different from baseline Arousal/Alertness: Awake/alert Orientation Level: Oriented X4 Attention: Sustained Sustained Attention: Appears intact Memory: Impaired Memory Impairment: Storage deficit;Retrieval deficit;Decreased recall of new information;Decreased short  term memory;Prospective memory Decreased Short Term Memory: Verbal basic Awareness: Impaired Awareness Impairment: Emergent impairment Problem Solving: Impaired Problem Solving Impairment: Verbal complex;Functional complex Executive Function: Organizing;Self Monitoring;Self Correcting;Sequencing Sequencing: Impaired Sequencing Impairment: Functional basic Organizing: Impaired Organizing Impairment: Functional basic Self Monitoring: Impaired Self Monitoring Impairment: Functional basic Self Correcting: Impaired Self Correcting Impairment: Functional basic Safety/Judgment: Impaired  Sensation Sensation Light Touch: Appears Intact Stereognosis: Appears Intact Hot/Cold: Appears Intact Proprioception: Appears Intact Coordination Gross Motor Movements are Fluid and Coordinated: Yes Fine Motor  Movements are Fluid and Coordinated: Yes  Motor  Motor Motor: Within Functional Limits Motor - Skilled Clinical Observations: generalized weakness  Mobility  Bed Mobility Bed Mobility: Rolling Left Rolling Right: 3: Mod assist Supine to Sit: 3: Mod assist Supine to Sit Details: Manual facilitation for weight shifting;Verbal cues for safe use of DME/AE Supine to Sit Details (indicate cue type and reason): pt requires assist to move LEs off of bed Sitting - Scoot to Edge of Bed: 4: Min guard Transfers Transfers: Sit to Stand;Stand to Sit Sit to Stand: 2: Max assist Sit to Stand: Patient Percentage: 20% Sit to Stand Details: Tactile cues for placement;Manual facilitation for weight bearing Sit to Stand Details (indicate cue type and reason): Pt requires manual facilitaiton for forward wt shift, lifting assistance, states this is more difficult due to pain in feet because he doesn't have his orthotics Stand to Sit: 3: Mod assist Stand to Sit Details (indicate cue type and reason): Tactile cues for placement;Verbal cues for sequencing   Trunk/Postural Assessment  Cervical  Assessment Cervical Assessment:  (forward flexed) Thoracic Assessment Thoracic Assessment:  (R lateral flexion) Lumbar Assessment Lumbar Assessment:  (posterior tilt) Postural Control Postural Control: Deficits on evaluation Righting Reactions: delayed   Balance Static Sitting Balance Static Sitting - Balance Support: Bilateral upper extremity supported;Feet supported Static Sitting - Level of Assistance: 5: Stand by assistance Dynamic Sitting Balance Dynamic Sitting - Level of Assistance: 4: Min assist Dynamic Sitting - Balance Activities: Reaching for objects Dynamic Standing Balance Dynamic Standing - Balance Support: During functional activity Dynamic Standing - Level of Assistance: 2: Max assist Dynamic Standing - Comments: without AD due to posterior lean, max manual facilitation to correct  Extremity/Trunk Assessment RUE Assessment RUE Assessment: Within Functional Limits LUE Assessment LUE Assessment: Within Functional Limits  FIM:  FIM - Bathing Bathing: 0: Activity did not occur FIM - Upper Body Dressing/Undressing Upper body dressing/undressing: 0: Activity did not occur FIM - Lower Body Dressing/Undressing Lower body dressing/undressing: 0: Activity did not occur FIM - Toileting Toileting: 0: Activity did not occur FIM - Press photographer Assistive Devices: Arm rests;Bed rails;HOB elevated Bed/Chair Transfer: 3: Supine > Sit: Mod A (lifting assist/Pt. 50-74%/lift 2 legs;3: Sit > Supine: Mod A (lifting assist/Pt. 50-74%/lift 2 legs);2: Bed > Chair or W/C: Max A (lift and lower assist);2: Chair or W/C > Bed: Max A (lift and lower assist) FIM - Diplomatic Services operational officer Devices: Human resources officer Transfers: 3-To toilet/BSC: Mod A (lift or lower assist);2-From toilet/BSC: Max A (lift and lower assist);6-Assistive device: No helper (sliding board to [facility], manual assist from [facility] d/t extra time)   Refer to Care  Plan for Long Term Goals  Recommendations for other services: None  Discharge Criteria: Patient will be discharged from OT if patient refuses treatment 3 consecutive times without medical reason, if treatment goals not met, if there is a change in medical status, if patient makes no progress towards goals or if patient is discharged from hospital.  The above assessment, treatment plan, treatment alternatives and goals were discussed and mutually agreed upon: by patient  [facility] 01/21/2013, 12:19 PM

## 2013-01-22 ENCOUNTER — Inpatient Hospital Stay (HOSPITAL_COMMUNITY): Payer: Medicare Other | Admitting: Rehabilitation

## 2013-01-22 ENCOUNTER — Inpatient Hospital Stay (HOSPITAL_COMMUNITY): Payer: Medicare Other

## 2013-01-22 ENCOUNTER — Inpatient Hospital Stay (HOSPITAL_COMMUNITY): Payer: Medicare Other | Admitting: Speech Pathology

## 2013-01-22 DIAGNOSIS — R5381 Other malaise: Secondary | ICD-10-CM

## 2013-01-22 DIAGNOSIS — M6282 Rhabdomyolysis: Secondary | ICD-10-CM

## 2013-01-22 MED ORDER — PREDNISONE 20 MG PO TABS
20.0000 mg | ORAL_TABLET | Freq: Every day | ORAL | Status: DC
  Administered 2013-01-22 – 2013-01-24 (×3): 20 mg via ORAL
  Filled 2013-01-22 (×5): qty 1

## 2013-01-22 NOTE — Progress Notes (Signed)
Physical Therapy Session Note  Patient Details  Name: Rodney Potts MRN: [id number] Date of Birth: [birth year]  Today's Date: 01/22/2013 Time: 1519-1600 Time Calculation (min): 41 min  Short Term Goals: Week 1:     Skilled Therapeutic Interventions/Progress Updates:   Pt received lying in bed with sister present.  Performed supine to sit with HOB flat and without rails at supervision.  Performed stand pivot with use of RW bed to w/c at mod assist.  Pt with increased difficulty with forward weight shift when standing and also requires max verbal cues for safety with use of RW and keeping it closer to himself and also getting completely to chair before sitting.  Feel pt is very unsafe with RW at this time and may benefit from slower squat pivot transfers.  Pt self propelled to gym with BLEs for increased strength and endurance at min assist level and max cues for increased step lengths and to continue with task.  Once in gym performed 45' x 2 gait without use of RW at min assist (HHA) with cues for upright posture and increased hip/knee flex, and increased stride length.  To assist with increased stride length and increased hip/knee flex, performed gait over step ladder at min/mod assist with cues for alternating step pattern to square and to increase hip/knee flex.  Performed stair training with use of B handrails to increase B quad and hip ext strength x 10 steps forwards.  Requires mod assist to prevent posterior LOB due to posterior lean when performing stairs.  Also provided max cues for step to pattern for increased safety.  Performed side stepping while pushing Bosu ball R and L to increase weight shifting and hip abd strength at mod assist for adequate weight shift.  Ended session with seated nustep x 2 mins with BLE/UEs at level 5 resistance and transitioned to BLEs only at level 4 for 3 mins.  Assisted back to room and into recliner with quick release belt donned and all needs in reach.    Therapy Documentation Precautions:  Precautions Precautions: Fall Precaution Comments:  h/o falls Restrictions Weight Bearing Restrictions: No   Pain: Pain Assessment Pain Assessment: No/denies pain Mobility:   Locomotion : Ambulation Ambulation/Gait Assistance: 4: Min assist Wheelchair Mobility Distance: 75   See FIM for current functional status  Therapy/Group: Individual Therapy  [facility] 01/22/2013, 5:52 PM

## 2013-01-22 NOTE — Progress Notes (Signed)
Speech Language Pathology Daily Session Note  Patient Details  Name: Rodney Potts MRN: [id number] Date of Birth: May 25, 1945  Today's Date: 01/22/2013 Time: 0800-0845 Time Calculation (min): 45 min  Short Term Goals: Week 1: SLP Short Term Goal 1 (Week 1): Pt will utilize swallowing compensatory straegies to minimize overt s/s of aspiration with Min A verbal and question cues.  SLP Short Term Goal 2 (Week 1): Pt will identify 2 physical and 2 cognitive deficits with Mod A multimodal cueing SLP Short Term Goal 3 (Week 1): Pt will utilize call bell to express wants/needs with Min A question cues.  SLP Short Term Goal 4 (Week 1): Pt will demonstrate functional problem solving for basic and familiar tasks with Mod A mulltimodal cueing  SLP Short Term Goal 5 (Week 1): Pt will utilize external memory aids to increase recall new, daily information with Min A multimodal cueing.   Skilled Therapeutic Interventions: Treatment focus on dysphagia and cognitive goals. SLP facilitated session by providing Mod A question cues for recall of swallowing compensatory strategies and Mod A multimodal cueing to utilize strategies throughout the meal. Pt did not demonstrate any overt s/s of aspiration and required supervision verbal cues for functional problem solving with tray set-up. Pt also required Min verbal cues for selective attention to self-feeding in a mildly distracting environment. Continue current plan of care.    FIM:  Comprehension Comprehension Mode: Auditory Comprehension: 5-Follows basic conversation/direction: With no assist Expression Expression Mode: Verbal Expression: 4-Expresses basic 75 - 89% of the time/requires cueing 10 - 24% of the time. Needs helper to occlude trach/needs to repeat words. Social Interaction Social Interaction: 4-Interacts appropriately 75 - 89% of the time - Needs redirection for appropriate language or to initiate interaction. Problem Solving Problem Solving:  3-Solves basic 50 - 74% of the time/requires cueing 25 - 49% of the time Memory Memory: 3-Recognizes or recalls 50 - 74% of the time/requires cueing 25 - 49% of the time FIM - Eating Eating Activity: 5: Supervision/cues  Pain Pain Assessment Pain Assessment: No/denies pain  Therapy/Group: Individual Therapy  Rodney Potts 01/22/2013, 4:40 PM

## 2013-01-22 NOTE — Progress Notes (Addendum)
Social Work Assessment and Plan  Patient Details  Name: Rodney Potts MRN: [id number] Date of Birth: [birth year]  Today's Date: 01/22/2013  Problem List:  Patient Active Problem List   Diagnosis Date Noted  . Physical deconditioning 01/21/2013  . Difficulty walking 01/15/2013  . Rhabdomyolysis 01/15/2013  . Abnormality of gait 07/02/2012  . Unspecified cerebral artery occlusion with cerebral infarction 07/02/2012  . Aphasia 07/02/2012  . Encounter for long-term (current) use of other medications 07/02/2012  . TIA (transient ischemic attack) 06/08/2012  . Ataxia 06/08/2012  . Slurred speech 06/09/2011  . Weakness generalized 06/09/2011  . CVA (cerebral infarction) 03/24/2011  . Seizures 03/24/2011  . OSA (obstructive sleep apnea) 03/24/2011  . IBS (irritable bowel syndrome) 03/24/2011  . 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 Brain tumor     glioma; S/P radiation and chemotherapy (01/14/2013)  . TIA (transient ischemic attack) ~ 2011  . IBS (irritable bowel syndrome)   . Shingles   . CVA (cerebral infarction)     R thalamic  . Allergy   . Complication of anesthesia     "after knee OR woke up very wild; pulling things out, etc" (01/14/2013)  . High cholesterol   . Sleep apnea     "corrected w/OR" (01/14/2013)  . Seizures     "hx from the scar tissue where brain tumor was; on RX to control" (01/14/2013)  . Stroke     "he's had a couple; still a little weaker on the left leg, drags that foot" (01/14/2013)  . Prostate cancer    Past Surgical History:  Past Surgical History  Procedure Laterality Date  . Cataract extraction w/ intraocular lens implant Bilateral 2000's    "don't know if they put lens in" (01/14/2013)  . Joint replacement    . Prostatectomy  1990's  . Uvulopalatopharyngoplasty  1980's    "for sleep apnea" (01/14/2013)  . Shoulder arthroscopy Right 2000's  . Total knee arthroplasty Bilateral  2000's   Social History:  reports that he has quit smoking. His smoking use included Cigarettes. He has a .48 pack-year smoking history. He has never used smokeless tobacco. He reports that he drinks about 4.2 ounces of alcohol per week. He reports that he does not use illicit drugs.  Family / Support Systems Marital Status: Married How Long?: 20 years Patient Roles: Spouse;Parent;Other (Comment) (brother) Spouse/Significant Other: [place] [employer] - wife  [phone] (h); [phone] (w); [phone] (m) Children: has 3 dtrs from his first marriage, but does not communicate with them much Other Supports: church family Anticipated Caregiver: wife is planning to take FMLA Ability/Limitations of Caregiver: None Caregiver Availability: 24/7 Family Dynamics: good support from wife and brother, sister-in-law, sister  Social History Preferred language: English Religion: Quaker Cultural Background: [employer] Education: Software engineer Read: Yes Write: Yes Employment Status: Retired Date Retired/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  He did make good eye contact with CSW, smiled several times, and was hopeful about his recovery on rehab. Recent Psychosocial Issues: None reported Pyschiatric History: Noted some depression in the past, but that it was situational and not something he currently experiences. Substance Abuse History: None reported  Patient / Family Perceptions, Expectations & Goals Pt/Family understanding of illness & functional limitations: Pt has a good understanding of his condition and knows that he  was fallling too much at home.  He hopes to become more flexible and steady during his time on Rehab.  CSW to still f/u with pt's wife about her understanding. Premorbid pt/family roles/activities: Pt likes to read, do puzzles, watch certain TV channels, spend time in the mountains. Anticipated changes in roles/activities/participation: Pt does indoor chores that pt's wife asks him to do and they share outside chores.  Wife may need to assist with these for a bit. Pt/family expectations/goals: Pt would like "to get more flexibility in legs and back."  [employer]: Pt attends Geophysicist/field seismologist at the [employer] Premorbid Home Care/DME Agencies: None Transportation available at discharge: Wife and brother Resource referrals recommended: Neuropsychology  Discharge Planning Living Arrangements: Spouse/significant other Support Systems: Spouse/significant other;Other relatives;Church/faith community Type of Residence: Private residence Insurance Resources: Administrator (specify) (Blue Cross Blue Shield State Health Plan) Financial Resources: Social Security Financial Screen Referred: No Living Expenses: Mortgage (two homes currently.  Trying to sell [place] home and building a home in [place] [facility] Money Management: Spouse Does the patient have any problems obtaining your medications?: No Home Management: Pt and wife share responsibilities, but pt reports wife can manage for now. Patient/Family Preliminary Plans: Plan per Admissions Coordinator and pt is for wife to take FMLA from her school to care for pt.  She is an Building control surveyor in a high school in [place]. Barriers to Discharge: Steps Social Work Anticipated Follow Up Needs: HH/OP Expected length of stay: 10 to 14 days  Clinical Impression CSW met with pt to complete assessment and to introduce self and role of CSW on Rehab.  Pt was friendly with CSW and willing to talk, but did seem flat  at times.  At other times, he would smile and share stories of hope and faith that were important to him and gave CSW good eye contact.  He reports feeling fine emotionally and is accepting of CSW checking in with him regularly on his mood.  Pt's wife was at school and so CSW left her a message for her to call CSW when she has a chance today or tomorrow so we can discuss pt's d/c plan.  Pt has a sister visiting from [place] and she has to return on Saturday and a brother and sister-in-law who live only 10 minutes away.  Pt has 3 dtrs who live up [place] whom he has not had good communication with due to problems between pt and his ex-wife.  He expressed sadness about this.  Pt is excited about a mountain house they are building and stated it will be ready for them to move into it after Thanksgiving.  They still have a home in [place] to sell, so that provides pt with a little stress, having too houses.  Pt admitted that he does not want to fall at home and lie there alone again, like he did this last time.  Pt has already decided to get a life alert type system so that he can access help easier in the future.  Pt does not have any concerns about going home with his wife.  CSW awaits wife's return phone call so that d/c plan can be confirmed.  Explained that d/c date would be determined at Tuesday's team conference.  Pt expressed understanding.  Pt has a walker at home  and a cane (single point) but no other equipment.  He has a built in sheet in shower at both houses.  CSW will continue to follow pt and will assist with d/c plan as needed.  Aragorn Recker, [place] 01/22/2013, 5:29 PM

## 2013-01-22 NOTE — Progress Notes (Signed)
Subjective/Complaints: Had some urinary incontinence last night. States that this happens sometimes at home, especially when he has a UTI A 12 point review of systems has been performed and if not noted above is otherwise negative.   Objective: Vital Signs: Blood pressure 148/87, pulse 71, temperature 97.9 F (36.6 C), temperature source Oral, resp. rate 17, height 5\' 9"  (1.753 m), weight 82 kg (180 lb 12.4 oz), SpO2 97.00%. No results found.  Recent Labs  01/21/13 0700  WBC 10.1  HGB 10.8*  HCT 32.4*  PLT 292    Recent Labs  01/21/13 0700  NA 139  K 4.1  CL 101  GLUCOSE 106*  BUN 18  CREATININE 0.91  CALCIUM 8.9   CBG (last 3)  No results found for this basename: GLUCAP,  in the last 72 hours  Wt Readings from Last 3 Encounters:  01/22/13 82 kg (180 lb 12.4 oz)  01/14/13 80.06 kg (176 lb 8 oz)  01/12/13 81.647 kg (180 lb)    Physical Exam:  HENT:  Head: Normocephalic.  Eyes: EOM are normal.  Neck: Normal range of motion. Neck supple. No thyromegaly present.  Cardiovascular: Normal rate and regular rhythm.  Respiratory: Effort normal and breath sounds normal. No respiratory distress.  GI: Soft. Bowel sounds are normal. He exhibits no distension.  Neurological: He is alert.  Patient is oriented today to place and person. He was able to provide his name age date of birth. He did follow simple commands. Basic insight and awareness only. Strength 3+/5 shoulder girdle to 4/5 at elbow and wrist. Hip flexors 3-, KE 3, ankles 3+ to 4/5 except where pain inhibits. No gross sensory changes.  Musc: right great toe/MCP less red, warm, and tender to palpation Skin: Skin is warm and dry.  Psychiatric: He has a normal mood and affect    Assessment/Plan: 1. Functional deficits secondary to rhabdomyolysis and deconditioning. (hx of brain glioma) which require 3+ hours per day of interdisciplinary therapy in a comprehensive inpatient rehab setting. Physiatrist is providing  close team supervision and 24 hour management of active medical problems listed below. Physiatrist and rehab team continue to assess barriers to discharge/monitor patient progress toward functional and medical goals. FIM: FIM - Bathing Bathing: 0: Activity did not occur  FIM - Upper Body Dressing/Undressing Upper body dressing/undressing: 0: Activity did not occur FIM - Lower Body Dressing/Undressing Lower body dressing/undressing: 0: Activity did not occur  FIM - Toileting Toileting steps completed by patient: Adjust clothing prior to toileting;Performs perineal hygiene Toileting Assistive Devices: Grab bar or rail for support Toileting: 0: Activity did not occur  FIM - Diplomatic Services operational officer Devices: Human resources officer Transfers: 3-To toilet/BSC: Mod A (lift or lower assist);2-From toilet/BSC: Max A (lift and lower assist);6-Assistive device: No helper (sliding board to [facility], manual assist from [facility] d/t extra time)  FIM - Bed/Chair Transfer Bed/Chair Transfer Assistive Devices: Arm rests;Bed rails;HOB elevated Bed/Chair Transfer: 3: Supine > Sit: Mod A (lifting assist/Pt. 50-74%/lift 2 legs;3: Sit > Supine: Mod A (lifting assist/Pt. 50-74%/lift 2 legs);2: Bed > Chair or W/C: Max A (lift and lower assist);2: Chair or W/C > Bed: Max A (lift and lower assist)  FIM - Locomotion: Wheelchair Distance: 40 Locomotion: Wheelchair: 1: Travels less than 50 ft with supervision, cueing or coaxing FIM - Locomotion: Ambulation Ambulation/Gait Assistance: 4: Min assist Locomotion: Ambulation: 1: Travels less than 50 ft with minimal assistance (Pt.>75%)  Comprehension Comprehension Mode: Auditory Comprehension: 5-Follows basic conversation/direction: With no  assist  Expression Expression Mode: Verbal Expression: 5-Expresses basic 90% of the time/requires cueing < 10% of the time.  Social Interaction Social Interaction: 4-Interacts appropriately 75 - 89%  of the time - Needs redirection for appropriate language or to initiate interaction.  Problem Solving Problem Solving: 2-Solves basic 25 - 49% of the time - needs direction more than half the time to initiate, plan or complete simple activities  Memory Memory: 3-Recognizes or recalls 50 - 74% of the time/requires cueing 25 - 49% of the time  Medical Problem List and Plan:  1. Rhabdomyolysis, deconditioning, history of brain glioma, and CVA  2. DVT Prophylaxis/Anticoagulation: SCDs. Monitor for any signs of DVT  3. Pain Management: Tylenol as needed. Monitor with increased mobility  4. Neuropsych: This patient is not capable of making decisions on his own behalf.  5. Dysphagia. Dysphagia 3 nectar thick liquids. Monitor for any signs of aspiration. Followup speech therapy  6. Seizure disorder. Keppra 750 mg twice a day, phenobarbital(luminal) 97.2 mg each bedtime. Monitor for any seizure activity  7. Mood. Aricept 5 mg each bedtime. Discuss cognitive baseline with family  8. Overactive bladder. Toviaz 8 mg each bedtime as prior to admission.    -recheck urine today 9. Hyperlipidemia. Resume Lipitor  10. Gout, 1st right MTP----prednisone, colchicine  -pain improved. Wean steroids  LOS (Days) 2 A FACE TO FACE EVALUATION WAS PERFORMED  Francee Setzer T 01/22/2013 7:44 AM

## 2013-01-22 NOTE — Progress Notes (Signed)
[facility]patient [facility] Individual Statement of Services  Patient Name:  Rodney Potts  Date:  01/22/2013  Welcome to the Inpatient [facility] Center.  Our goal is to provide you with an individualized program based on your diagnosis and situation, designed to meet your specific needs.  With this comprehensive rehabilitation program, you will be expected to participate in at least 3 hours of rehabilitation therapies Monday-Friday, with modified therapy programming on the weekends.  Your rehabilitation program will include the following services:  Physical Therapy (PT), Occupational Therapy (OT), Speech Therapy (ST), 24 hour per day rehabilitation nursing, Therapeutic Recreation (TR), Neuropsychology, Case Management (Social Worker), Rehabilitation Medicine, Nutrition Services and Pharmacy Services  Weekly team conferences will be held on Tuesdays to discuss your progress.  Your Social Worker will talk with you frequently to get your input and to update you on team discussions.  Team conferences with you and your family in attendance may also be held.  Expected length of stay:  10 - 14 days Overall anticipated outcome:   Supervision  Depending on your progress and recovery, your program may change. Your Social Worker will coordinate services and will keep you informed of any changes. Your Social Worker's name and contact numbers are listed  below.  The following services may also be recommended but are not provided by the Inpatient Rehabilitation Center:   Driving Evaluations  Home Health Rehabiltiation Services  Outpatient Rehabilitation Services   Arrangements will be made to provide these services after discharge if needed.  Arrangements include referral to agencies that provide these services.  Your insurance has been verified to be:  [employer] and [employer] Your primary doctor is:  Dr. Benedetto Goad  Pertinent information will be shared with your doctor  and your insurance company.  Social Worker:  Staci Acosta, LCSW  [phone] or [phone]  Information discussed with and copy given to patient by: Elvera Lennox, 01/22/2013, 2:15 PM

## 2013-01-22 NOTE — Progress Notes (Signed)
Physical Therapy Note  Patient Details  Name: STORM SOVINE MRN: [id number] Date of Birth: 1945/03/12 Today's Date: 01/22/2013 [id number] 29 min individual therapy No pain reported at rest, but pt was reluctant to shift to RLE due to R foot gout pain; premedicated  Pt's brother and sister observed.  neuromuscular re-education via forced use, manual and tactile cues, demo, for rolling L and sitting up without rail,  wt shifting in standing, midline orientation in standing, L stance stability during gait on level tile x 20' x 3 with min/mod assist without AD, min assist with RW, mod assist for turns.  Also for sitting balance during doffing and donning different socks, donning shoes, with supervision for socks, min assist for shoes.  Session ended with pt at nurse's station seated for lunch, NT to supervise.  Pt's brother reported that pt went to a gym 3x/week but otherwise was often alone and sedentary at home.  Brother inquired if his help with an HEP daily, would be helpful, after pt's wife returns to work from [employer].  Therapist discussed life- long fitness/safety/balance, given pt's hx of brain CA, recent falls and decline in strength and balance.  Therapist recommended that family look into community activities and use of a trainer at [employer] to optimize his safe mobility after his follow-up PT has ended.  Durwood Dittus 01/22/2013, 12:25 PM

## 2013-01-22 NOTE — Progress Notes (Signed)
Occupational Therapy Session Note  Patient Details  Name: NEYLAND PETTENGILL MRN: [id number] Date of Birth: 11/26/1945  Today's Date: 01/22/2013 Time: 0900-0956 Time Calculation (min): 56 min  Short Term Goals: Week 1:  OT Short Term Goal 1 (Week 1): Patient will completed upper body bathing seated with min assist OT Short Term Goal 2 (Week 1): Patient will complete lower body bathing and dressing sitting and standing using AE, prn, with mod assist OT Short Term Goal 2 - Progress (Week 1): Met OT Short Term Goal 3 (Week 1): Patient will complete transfer from w/c to [facility] with min assist using LRAD OT Short Term Goal 4 (Week 1): Patient will complete upper body dressing with supervision OT Short Term Goal 5 (Week 1): Patient will complete toileting with mod assist  Skilled Therapeutic Interventions/Progress Updates:    Pt engaged in BADL retraining including bathing at shower level and dressing with sit<>stand from w/c.  Pt required min verbal cues for safety awareness when standing in shower but was able to complete all tasks with steady A only.  Pt required increased assistance with LB dressing.  Pt requires extra time to process commands/instructions and exhibited decreased awareness of deficits.  Focus on transfers, dynamic standing balance, activity tolerance, and safety awareness.  Therapy Documentation Precautions:  Precautions Precautions: Fall Precaution Comments:  h/o falls Restrictions Weight Bearing Restrictions: No    See FIM for current functional status  Therapy/Group: Individual Therapy  Rich Brave 01/22/2013, 10:00 AM

## 2013-01-23 ENCOUNTER — Inpatient Hospital Stay (HOSPITAL_COMMUNITY): Payer: Medicare Other | Admitting: Physical Therapy

## 2013-01-23 ENCOUNTER — Inpatient Hospital Stay (HOSPITAL_COMMUNITY): Payer: Medicare Other | Admitting: Speech Pathology

## 2013-01-23 ENCOUNTER — Encounter (HOSPITAL_COMMUNITY): Payer: Medicare Other

## 2013-01-23 LAB — URINALYSIS, ROUTINE W REFLEX MICROSCOPIC
Glucose, UA: NEGATIVE mg/dL
Ketones, ur: NEGATIVE mg/dL
Leukocytes, UA: NEGATIVE
Nitrite: NEGATIVE
Protein, ur: NEGATIVE mg/dL
pH: 5.5 (ref 5.0–8.0)

## 2013-01-23 NOTE — Progress Notes (Signed)
Occupational Therapy Session Note  Patient Details  Name: Rodney Potts MRN: [id number] Date of Birth: 12-Nov-1945  Today's Date: 01/23/2013 Time: 0800-0857 Time Calculation (min): 57 min  Short Term Goals: Week 1:  OT Short Term Goal 1 (Week 1): Patient will completed upper body bathing seated with min assist OT Short Term Goal 2 (Week 1): Patient will complete lower body bathing and dressing sitting and standing using AE, prn, with mod assist OT Short Term Goal 2 - Progress (Week 1): Met OT Short Term Goal 3 (Week 1): Patient will complete transfer from w/c to [facility] with min assist using LRAD OT Short Term Goal 4 (Week 1): Patient will complete upper body dressing with supervision OT Short Term Goal 5 (Week 1): Patient will complete toileting with mod assist  Skilled Therapeutic Interventions/Progress Updates:    Pt engaged in ADL retraining including bathing at walk-in shower level and dressing with sit<>stand from EOB.  Pt required max verbal cues for RW safety when ambulating to bathroom and from bathroom to room.  Pt attempted to stand in shower multiple times prior to letting therapist know what he was going to do.  Pt exhibited extra time to respond to requests and questions.  Pt exhibits decreased awareness of deficits.  Pt required max verbal cues for RW management when performing sit<>stands and when negotiating turns.  Pt completed bathing and dressing tasks with steady A but required max verbal cues throughout session for safety awareness. Focus on safety awareness, functional amb with RW, dynamic standing balance, and activity tolerance.  Therapy Documentation Precautions:  Precautions Precautions: Fall Precaution Comments:  h/o falls Restrictions Weight Bearing Restrictions: No Pain: Pain Assessment Pain Assessment: No/denies pain  See FIM for current functional status  Therapy/Group: Individual Therapy  Rich Brave 01/23/2013, 9:01 AM

## 2013-01-23 NOTE — Progress Notes (Signed)
Subjective/Complaints: Complains of urinary frequency, accidents at night. Uses toviaz at home which we have him on A 12 point review of systems has been performed and if not noted above is otherwise negative.   Objective: Vital Signs: Blood pressure 136/79, pulse 71, temperature 98.2 F (36.8 C), temperature source Oral, resp. rate 17, height 5\' 9"  (1.753 m), weight 82 kg (180 lb 12.4 oz), SpO2 95.00%. No results found.  Recent Labs  01/21/13 0700  WBC 10.1  HGB 10.8*  HCT 32.4*  PLT 292    Recent Labs  01/21/13 0700  NA 139  K 4.1  CL 101  GLUCOSE 106*  BUN 18  CREATININE 0.91  CALCIUM 8.9   CBG (last 3)  No results found for this basename: GLUCAP,  in the last 72 hours  Wt Readings from Last 3 Encounters:  01/22/13 82 kg (180 lb 12.4 oz)  01/14/13 80.06 kg (176 lb 8 oz)  01/12/13 81.647 kg (180 lb)    Physical Exam:  HENT:  Head: Normocephalic.  Eyes: EOM are normal.  Neck: Normal range of motion. Neck supple. No thyromegaly present.  Cardiovascular: Normal rate and regular rhythm.  Respiratory: Effort normal and breath sounds normal. No respiratory distress.  GI: Soft. Bowel sounds are normal. He exhibits no distension.  Neurological: He is alert.  Patient is oriented today to place and person. He was able to provide his name age date of birth. He did follow simple commands. Basic insight and awareness only. Strength 3+/5 shoulder girdle to 4/5 at elbow and wrist. Hip flexors 3-, KE 3, ankles 3+ to 4/5 except where pain inhibits. No gross sensory changes.  Musc: right great toe/MCP less red, warm, and tender to palpation Skin: Skin is warm and dry.  Psychiatric: He has a normal mood and affect    Assessment/Plan: 1. Functional deficits secondary to rhabdomyolysis and deconditioning. (hx of brain glioma) which require 3+ hours per day of interdisciplinary therapy in a comprehensive inpatient rehab setting. Physiatrist is providing close team supervision  and 24 hour management of active medical problems listed below. Physiatrist and rehab team continue to assess barriers to discharge/monitor patient progress toward functional and medical goals. FIM: FIM - Bathing Bathing Steps Patient Completed: Chest;Right Arm;Left Arm;Abdomen;Front perineal area;Buttocks;Left lower leg (including foot);Right lower leg (including foot);Left upper leg;Right upper leg Bathing: 4: Steadying assist  FIM - Upper Body Dressing/Undressing Upper body dressing/undressing steps patient completed: Thread/unthread right sleeve of pullover shirt/dresss;Thread/unthread left sleeve of pullover shirt/dress;Put head through opening of pull over shirt/dress;Pull shirt over trunk Upper body dressing/undressing: 5: Supervision: Safety issues/verbal cues FIM - Lower Body Dressing/Undressing Lower body dressing/undressing steps patient completed: Thread/unthread right underwear leg;Pull underwear up/down;Thread/unthread right pants leg;Pull pants up/down;Don/Doff right sock Lower body dressing/undressing: 2: Max-Patient completed 25-49% of tasks  FIM - Toileting Toileting steps completed by patient: Adjust clothing prior to toileting;Performs perineal hygiene;Adjust clothing after toileting Toileting Assistive Devices: Grab bar or rail for support Toileting: 4: Steadying assist  FIM - Diplomatic Services operational officer Devices: Human resources officer Transfers: 3-To toilet/BSC: Mod A (lift or lower assist);2-From toilet/BSC: Max A (lift and lower assist);6-Assistive device: No helper (sliding board to [facility], manual assist from [facility] d/t extra time)  FIM - Banker Devices: Walker;Arm rests Bed/Chair Transfer: 5: Supine > Sit: Supervision (verbal cues/safety issues);3: Bed > Chair or W/C: Mod A (lift or lower assist)  FIM - Locomotion: Wheelchair Distance: 75 Locomotion: Wheelchair: 2: Travels 50 - 149  ft with minimal  assistance (Pt.>75%) FIM - Locomotion: Ambulation Locomotion: Ambulation Assistive Devices:  (HHA) Ambulation/Gait Assistance: 4: Min assist Locomotion: Ambulation: 1: Travels less than 50 ft with minimal assistance (Pt.>75%)  Comprehension Comprehension Mode: Auditory Comprehension: 5-Follows basic conversation/direction: With extra time/assistive device  Expression Expression Mode: Verbal Expression: 4-Expresses basic 75 - 89% of the time/requires cueing 10 - 24% of the time. Needs helper to occlude trach/needs to repeat words.  Social Interaction Social Interaction: 4-Interacts appropriately 75 - 89% of the time - Needs redirection for appropriate language or to initiate interaction.  Problem Solving Problem Solving: 3-Solves basic 50 - 74% of the time/requires cueing 25 - 49% of the time  Memory Memory: 3-Recognizes or recalls 50 - 74% of the time/requires cueing 25 - 49% of the time  Medical Problem List and Plan:  1. Rhabdomyolysis, deconditioning, history of brain glioma, and CVA  2. DVT Prophylaxis/Anticoagulation: SCDs. Monitor for any signs of DVT  3. Pain Management: Tylenol as needed. Monitor with increased mobility  4. Neuropsych: This patient is not capable of making decisions on his own behalf.  5. Dysphagia. Dysphagia 3 nectar thick liquids. Monitor for any signs of aspiration. Followup speech therapy  6. Seizure disorder. Keppra 750 mg twice a day, phenobarbital(luminal) 97.2 mg each bedtime. Monitor for any seizure activity  7. Mood. Aricept 5 mg each bedtime. Discuss cognitive baseline with family  8. Overactive bladder. Toviaz 8 mg daily---at bedtime  -ua unremarkable, culture pending  -timed voids, fluid rationing at night 9. Hyperlipidemia. Resume Lipitor  10. Gout, 1st right MTP----prednisone, colchicine  -pain improved. Wean steroids  LOS (Days) 3 A FACE TO FACE EVALUATION WAS PERFORMED  Kyaira Trantham T 01/23/2013 7:57 AM

## 2013-01-23 NOTE — Progress Notes (Signed)
Physical Therapy Session Note  Patient Details  Name: Rodney Potts MRN: [id number] Date of Birth: June 03, 1945  Today's Date: 01/23/2013 Time: 4098-1191 Time Calculation (min): 45 min   Skilled Therapeutic Interventions/Progress Updates:    Pt received sitting in recliner with quick release belt. Pt performed gait to gym with RW and min A with min to mod vcs to improve upright posture and to stay in closer to walker. Pt performed Nu-Step x 6 mins Level 3 B UE/LEs. Standing balance on level and uneven surfaces with reaching and throwing activities, stepping activities with use of numbered discs. Pt displays difficulty maintaining upright posture and with single leg stance for dynamic activities, pt required 4 seated rests to complete tasks. Pt very motivated. Returned to room sit to supine with supervision A, bed alarm engaged and needs within reach.   Therapy Documentation Precautions:  Precautions Precautions: Fall Precaution Comments:  h/o falls Restrictions Weight Bearing Restrictions: No       Pain: Pain Assessment Pain Assessment: pain in R cervical and shoulder regions 5/10 . nsg consulted and pt received meds. Pain decreased to 3/10 at end of session    Locomotion : Ambulation Ambulation/Gait Assistance: 4: Min assist             See FIM for current functional status  Therapy/Group: Individual Therapy  Jackelyn Knife 01/23/2013, 3:53 PM

## 2013-01-23 NOTE — Progress Notes (Signed)
Speech Language Pathology Daily Session Note  Patient Details  Name: Rodney Potts MRN: [id number] Date of Birth: [birth year]  Today's Date: 01/23/2013 Time: 7829-5621 Time Calculation (min): 65 min  Short Term Goals: Week 1: SLP Short Term Goal 1 (Week 1): Pt will utilize swallowing compensatory straegies to minimize overt s/s of aspiration with Min A verbal and question cues.  SLP Short Term Goal 2 (Week 1): Pt will identify 2 physical and 2 cognitive deficits with Mod A multimodal cueing SLP Short Term Goal 3 (Week 1): Pt will utilize call bell to express wants/needs with Min A question cues.  SLP Short Term Goal 4 (Week 1): Pt will demonstrate functional problem solving for basic and familiar tasks with Mod A mulltimodal cueing  SLP Short Term Goal 5 (Week 1): Pt will utilize external memory aids to increase recall new, daily information with Min A multimodal cueing.   Skilled Therapeutic Interventions: Treatment focus on dysphagia goals. SLP facilitated session by providing Min A question cues for recall of swallowing compensatory strategies and total A verbal cues for utilization of chin tuck and multiple swallows throughout the meal. Suspect decreased utilization is due to a more distracting environment.  Pt without overt s/s of aspiration. Pt also required supervision verbal cues for functional problem solving for cutting food and was Mod I for opening containers. Continue current plan of care.    FIM:  Comprehension Comprehension Mode: Auditory Comprehension: 5-Follows basic conversation/direction: With no assist Expression Expression Mode: Verbal Expression: 5-Expresses basic 90% of the time/requires cueing < 10% of the time. Social Interaction Social Interaction: 4-Interacts appropriately 75 - 89% of the time - Needs redirection for appropriate language or to initiate interaction. Problem Solving Problem Solving: 2-Solves basic 25 - 49% of the time - needs direction more than  half the time to initiate, plan or complete simple activities Memory Memory: 3-Recognizes or recalls 50 - 74% of the time/requires cueing 25 - 49% of the time FIM - Eating Eating Activity: 5: Supervision/cues;5: Set-up assist for cut food  Pain Pain Assessment Pain Assessment: No/denies pain  Therapy/Group: Individual Therapy  Jovan Schickling 01/23/2013, 12:34 PM

## 2013-01-23 NOTE — Progress Notes (Signed)
Physical Therapy Session Note  Patient Details  Name: Rodney Potts MRN: [id number] Date of Birth: 03-10-1945  Today's Date: 01/23/2013 Time: 0900-0959 Time Calculation (min): 59 min  Skilled Therapeutic Interventions/Progress Updates:    Pt reports his [place], reports plan is to D/C to this house. Ambulation to/from gym ~120' with RW min/mod assist, cues for smooth even cadence and proximity to RW. Static and dynamic standing balance with forwards reaching task and ball toss. Tall kneeling and quadruped positions working on pelvic and core control, able to lift one extremity at a time for ~5 sec, one upper one lower extremity simultaneous for only a few seconds at a time. Floor transfer with mod assist. Urinary urgency, incontinent prior to reaching bathroom. Bathroom mobiltiy with constant mod assist due to impaired safety particularly when rushed.   Therapy Documentation Precautions:  Precautions Precautions: Fall Precaution Comments:  h/o falls Restrictions Weight Bearing Restrictions: No Pain: Pain Assessment Pain Assessment: No/denies pain  See FIM for current functional status  Therapy/Group: Individual Therapy  Wilhemina Bonito 01/23/2013, 12:59 PM

## 2013-01-23 NOTE — IPOC Note (Signed)
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  Hospital Problems: Principal Problem:   Physical deconditioning     Functional Problem List: Nursing Bladder;Bowel;Endurance;Medication Management;Motor;Nutrition;Pain;Perception;Safety;Sensory;Skin Integrity;Edema;Behavior  PT Balance;Endurance;Motor;Safety  OT Balance;Cognition;Endurance;Motor;Pain;Perception;Safety  SLP Cognition  TR         Basic ADL's: OT Eating;Grooming;Bathing;Dressing;Toileting     Advanced  ADL's: OT       Transfers: PT Bed Mobility;Bed to Chair;Car;Furniture  OT Toilet;Tub/Shower     Locomotion: PT Ambulation;Stairs     Additional Impairments: OT    SLP Swallowing;Social Cognition   Problem Solving;Memory;Attention;Awareness  TR      Anticipated Outcomes Item Anticipated Outcome  Self Feeding Supervision  Swallowing  Supervision   Basic self-care  Min A  Toileting  Supervision   Bathroom Transfers Supervision  Bowel/Bladder  Managed bowel and bladder with minimal assistance.  Transfers  supervision  Locomotion  supervision  Communication     Cognition  Min A  Pain  Pain level 3 or less on a scale of 10-20  Safety/Judgment  Safety/judgement with minimal assistance noted.   Therapy Plan: PT Intensity: Minimum of 1-2 x/day ,45 to 90 minutes PT Frequency: 5 out of 7 days PT Duration Estimated Length of Stay: 10-14 days OT Intensity: Minimum of 1-2 x/day, 45 to 90 minutes OT Frequency: 5 out of 7 days OT Duration/Estimated Length of Stay: 14-21 days SLP Intensity: Minumum of 1-2 x/day, 30 to 90 minutes SLP Frequency: 5 out of 7 days SLP Duration/Estimated Length of Stay: 10-14 days       Team Interventions: Nursing Interventions Patient/Family Education;Bladder Management;Bowel Management;Disease Management/Prevention;Pain Management;Medication Management;Skin Care/Wound  Management;Discharge Planning;Psychosocial Support  PT interventions Ambulation/gait training;Discharge planning;Functional mobility training;Therapeutic Activities;Therapeutic Exercise;Wheelchair propulsion/positioning;Neuromuscular re-education;Balance/vestibular training;Cognitive remediation/compensation;DME/adaptive equipment instruction;Pain management;Splinting/orthotics;UE/LE Strength taining/ROM;UE/LE Risk manager training;Patient/family education;Community reintegration  OT Interventions Balance/vestibular training;Cognitive remediation/compensation;Discharge planning;DME/adaptive equipment instruction;Patient/family education;Pain management;Neuromuscular re-education;Functional mobility training;Self Care/advanced ADL retraining;Therapeutic Activities;Therapeutic Exercise;Wheelchair propulsion/positioning;UE/LE Coordination activities  SLP Interventions Cognitive remediation/compensation;Cueing hierarchy;Dysphagia/aspiration precaution training;Functional tasks;Environmental controls;Speech/Language facilitation;Patient/family education;Therapeutic Activities;Internal/external aids;Therapeutic Exercise  TR Interventions    SW/CM Interventions Discharge Planning;Psychosocial Support;Patient/Family Education    Team Discharge Planning: Destination: PT-Home ,OT- Home , SLP-Home Projected Follow-up: PT-Outpatient PT, OT-  Home health OT, SLP-Home Health SLP;24 hour supervision/assistance Projected Equipment Needs: PT-To be determined, OT- Wheelchair (measurements);Sliding board;3 in 1 bedside comode, SLP-None recommended by SLP Patient/family involved in discharge planning: PT- Patient,  OT-Patient, SLP-Patient  MD ELOS: 13-15 days Medical Rehab Prognosis:  Excellent Assessment: The patient has been admitted for CIR therapies. The team will be addressing, functional mobility, strength, stamina, balance, safety, adaptive techniques/equipment, self-care, bowel and bladder mgt,  patient and caregiver education, NMR, pain mgt, cognitive perceptual awareness. Goals have been set at supervision to minimal assist.    Ranelle Oyster, MD, [facility]      See Team Conference Notes for weekly updates to the plan of care

## 2013-01-24 ENCOUNTER — Inpatient Hospital Stay (HOSPITAL_COMMUNITY): Payer: Medicare Other | Admitting: *Deleted

## 2013-01-24 ENCOUNTER — Inpatient Hospital Stay (HOSPITAL_COMMUNITY): Payer: Medicare Other | Admitting: Speech Pathology

## 2013-01-24 DIAGNOSIS — R1319 Other dysphagia: Secondary | ICD-10-CM

## 2013-01-24 LAB — URINE CULTURE

## 2013-01-24 MED ORDER — PREDNISONE 10 MG PO TABS
10.0000 mg | ORAL_TABLET | Freq: Every day | ORAL | Status: DC
  Administered 2013-01-25 – 2013-01-26 (×2): 10 mg via ORAL
  Filled 2013-01-24 (×4): qty 1

## 2013-01-24 NOTE — Progress Notes (Signed)
  Subjective/Complaints:  Patient denies complaints. He is eating breakfast.  Objective: Vital Signs: Blood pressure 117/71, pulse 66, temperature 98 F (36.7 C), temperature source Oral, resp. rate 18, height 5\' 9"  (1.753 m), weight 180 lb 12.4 oz (82 kg), SpO2 97.00%.  Well-developed male. No acute distress. Chest clear auscultation. Cardiac exam S1-S2 are regular. Abdominal exam active bowel sounds, soft her extremities no significant edema.   Assessment/Plan: 1. Functional deficits secondary to rhabdomyolysis and deconditioning. (hx of brain glioma)  Medical Problem List and Plan:  1. Rhabdomyolysis, deconditioning, history of brain glioma, and CVA  2. DVT Prophylaxis/Anticoagulation: SCDs. 3. Pain Management: Tylenol as needed. Monitor with increased mobility  4. Neuropsych: This patient is not capable of making decisions on his own behalf.  5. Dysphagia. Dysphagia 3 nectar thick liquids. Monitor for any signs of aspiration. Followup speech therapy  6. Seizure disorder. Keppra 750 mg twice a day, phenobarbital(luminal) 97.2 mg each bedtime. Monitor for any seizure activity  7. Mood. Aricept 5 mg each bedtime. Discuss cognitive baseline with family  8. Overactive bladder. Toviaz 8 mg daily---at bedtime  -ua unremarkable, culture pending  -timed voids, fluid rationing at night 9. Hyperlipidemia. Resume Lipitor  10. Gout, 1st right MTP----improved. Decrease steroids. We'll decrease the prednisone 10 mg daily.  LOS (Days) 4 A FACE TO FACE EVALUATION WAS PERFORMED  [facility] 01/24/2013 6:47 AM

## 2013-01-24 NOTE — Progress Notes (Signed)
Occupational Therapy Note  Patient Details  Name: Rodney Potts MRN: [id number] Date of Birth: 1945/08/17 Today's Date: 01/24/2013  Time: 0800-0900  (60 min) Pain:  none Individual session   Pt engaged in ADL retraining including bathing at walk-in shower level and dressing with sit<>stand from 3n1. He amubllated with RW to toilet and urinated.  Provided max cues for sit to stand from toilet and shower bench. He exhibited extra time to respond to requests and questions. Pt exhibits decreased awareness of deficits. Pt required max verbal cues for RW management when performing sit<>stands and when negotiating turns. Pt completed bathing and dressing tasks with minA but required max verbal cues throughout session for safety awareness. Focus on safety awareness, functional amb with RW, dynamic standing balance, and activity tolerance.       Humberto Seals 01/24/2013, 8:27 AM

## 2013-01-24 NOTE — Progress Notes (Signed)
Physical Therapy Session Note  Patient Details  Name: Rodney Potts MRN: [id number] Date of Birth: [birth year]  Today's Date: 01/24/2013 Time:  0900-1000, 1330-1400   Skilled Therapeutic Interventions/Progress Updates:  Session I 60 min  Patient in bed, after OT session,no c/o of pain reported at this time.Supine to sit with verbal cues for sequencing only stand by assistance needed.Sit to stand with min A and verbal cues for posture sequencing and safety. Patient participated in gait training with 2 wheeled walker to /from gym with min A and mod verbal cues for posture and step length. Patient able to correct step lenght when constantly cued ,manual facilitation needed to achieve proper posture. Nu Step 1 x 5 min level Borg level 11, 5 min x1 level 8 Borg level 13-activity performed to increase muscular strength and distribution of weights and reciprocal movements. Sit to stand from mat at the lowest position w/o use of UE with min A 1x10 , 1/2 stands 1x 10. Static standing w/o support 4 x 2 min with manual facilitation of posture and min A, weight shifting R and L ,with postural corrections (cued). Step ups on a 4 in step with modA ,especially when WB on R.-manual knee locking. Standing on a foam board 2 x 45 s. With mod A ., minin squats performed in standing on level surface 1 x 15 and on foam board 2 x 10.Marland Kitchen Session II 30 min; Gait training with Front wheel walker with min A ,with max cues for posture and step length. Patient not able to self correct. Biodex training for balance and weight shifting.   Therapy Documentation Precautions:  Precautions Precautions: Fall Precaution Comments:  h/o falls Restrictions Weight Bearing Restrictions: No Vital Signs: Therapy Vitals Temp: 98 F (36.7 C) Temp src: Oral Pulse Rate: 77 Resp: 18 BP: 114/75 mmHg Patient Position, if appropriate: Lying Oxygen Therapy SpO2: 98 % O2 Device: None (Room air)   See FIM for current functional  status  Therapy/Group: Individual Therapy  Dorna Mai 01/24/2013, 3:45 PM

## 2013-01-24 NOTE — Progress Notes (Signed)
Speech Language Pathology Daily Session Note  Patient Details  Name: Rodney Potts MRN: [id number] Date of Birth: December 28, 1945  Today's Date: 01/24/2013 Time: 1300-1330 Time Calculation (min): 30 min  Short Term Goals: Week 1: SLP Short Term Goal 1 (Week 1): Pt will utilize swallowing compensatory straegies to minimize overt s/s of aspiration with Min A verbal and question cues.  SLP Short Term Goal 2 (Week 1): Pt will identify 2 physical and 2 cognitive deficits with Mod A multimodal cueing SLP Short Term Goal 3 (Week 1): Pt will utilize call bell to express wants/needs with Min A question cues.  SLP Short Term Goal 4 (Week 1): Pt will demonstrate functional problem solving for basic and familiar tasks with Mod A mulltimodal cueing  SLP Short Term Goal 5 (Week 1): Pt will utilize external memory aids to increase recall new, daily information with Min A multimodal cueing.   Skilled Therapeutic Interventions: Skilled ST services provided with focus on cognitive goals. Pt seen in room for ST therapy session. Pt noted with decreased problem solving. Slp administered sequencing tasks with picture cards. Pt sequenced 6-step cards with 30% accuracy. Slp provided multimodal cuing and thorough explanations to increase reasoning. Pt sequenced 4-step picture cards with 50% accuracy, given mod multimodal cuing. Pt recalled 2/3 swallow strategies with min assist.    FIM:  Comprehension Comprehension Mode: Auditory Comprehension: 5-Follows basic conversation/direction: With extra time/assistive device Expression Expression Mode: Verbal Expression: 5-Expresses basic 90% of the time/requires cueing < 10% of the time. Social Interaction Social Interaction: 4-Interacts appropriately 75 - 89% of the time - Needs redirection for appropriate language or to initiate interaction. Problem Solving Problem Solving: 2-Solves basic 25 - 49% of the time - needs direction more than half the time to initiate, plan or  complete simple activities Memory Memory: 3-Recognizes or recalls 50 - 74% of the time/requires cueing 25 - 49% of the time FIM - Eating Eating Activity: 5: Supervision/cues  Pain Pain Assessment Pain Assessment: No/denies pain  Therapy/Group: Individual Therapy  Lonnell Chaput, Kara Pacer 01/24/2013, 4:39 PM

## 2013-01-24 NOTE — Progress Notes (Signed)
Speech Language Pathology Daily Session Note  Patient Details  Name: Rodney Potts MRN: [id number] Date of Birth: 1945/06/19  Today's Date: 01/24/2013 Time: 1130-1200 Time Calculation (min): 30 min  Short Term Goals: Week 1: SLP Short Term Goal 1 (Week 1): Pt will utilize swallowing compensatory straegies to minimize overt s/s of aspiration with Min A verbal and question cues.  SLP Short Term Goal 2 (Week 1): Pt will identify 2 physical and 2 cognitive deficits with Mod A multimodal cueing SLP Short Term Goal 3 (Week 1): Pt will utilize call bell to express wants/needs with Min A question cues.  SLP Short Term Goal 4 (Week 1): Pt will demonstrate functional problem solving for basic and familiar tasks with Mod A mulltimodal cueing  SLP Short Term Goal 5 (Week 1): Pt will utilize external memory aids to increase recall new, daily information with Min A multimodal cueing.   Skilled Therapeutic Interventions: Skilled ST intervention provided with focus on dysphagia goals. Pt seen at [employer] for lunch. Pt noted with decreased swallow safety, requiring min-mod cues to utilize compensatory swallow strategies during meals. Pt noted with no overt s/s of aspiration with D3/NTL. Pt easily distracted from meal by auditory and visual distractions, requiring occasional cuing. Pt utilized chin tuck, given min verbal and visual cues. Pt required max cueing to occasionally clear throat.     FIM:  Comprehension Comprehension Mode: Auditory Comprehension: 5-Follows basic conversation/direction: With extra time/assistive device Expression Expression Mode: Verbal Expression: 5-Expresses basic 90% of the time/requires cueing < 10% of the time. Social Interaction Social Interaction: 4-Interacts appropriately 75 - 89% of the time - Needs redirection for appropriate language or to initiate interaction. Problem Solving Problem Solving: 2-Solves basic 25 - 49% of the time - needs direction more than half  the time to initiate, plan or complete simple activities Memory Memory: 3-Recognizes or recalls 50 - 74% of the time/requires cueing 25 - 49% of the time FIM - Eating Eating Activity: 5: Supervision/cues  Pain Pain Assessment Pain Assessment: No/denies pain  Therapy/Group: Individual Therapy  Dalton Molesworth, Kara Pacer 01/24/2013, 4:25 PM

## 2013-01-25 NOTE — Progress Notes (Signed)
  Subjective/Complaints: Feels well Has long term urinary incontinence-- may be somewhat worse. Has urologist- on toviaz U/a- 01/23/13- normal, urine culture normal  Objective: Vital Signs: Blood pressure 130/81, pulse 63, temperature 97.9 F (36.6 C), temperature source Oral, resp. rate 20, height 5\' 9"  (1.753 m), weight 180 lb 12.4 oz (82 kg), SpO2 94.00%.  Well-developed male. No acute distress. Chest clear auscultation. Cardiac exam S1-S2 are regular. Abdominal exam active bowel sounds, soft her extremities no significant edema.   Assessment/Plan: 1. Functional deficits secondary to rhabdomyolysis and deconditioning. (hx of brain glioma)  Medical Problem List and Plan:  1. Rhabdomyolysis, deconditioning, history of brain glioma, and CVA  2. DVT Prophylaxis/Anticoagulation: SCDs. 3. Pain Management: Tylenol as needed. Monitor with increased mobility  4. Neuropsych: This patient is not capable of making decisions on his own behalf.  5. Dysphagia. Dysphagia 3 nectar thick liquids. Monitor for any signs of aspiration. Followup speech therapy  6. Seizure disorder. Keppra 750 mg twice a day, phenobarbital(luminal) 97.2 mg each bedtime. Monitor for any seizure activity  7. Mood. Aricept 5 mg each bedtime. Discuss cognitive baseline with family  8. Overactive bladder. Toviaz 8 mg daily---at bedtime  -ua and culture negative 9. Hyperlipidemia. Resume Lipitor  10. Gout, 1st right MTP----improved. Decrease steroids. We'll decrease the prednisone 10 mg daily (done 01/24/13)  LOS (Days) 5 A FACE TO FACE EVALUATION WAS PERFORMED  SWORDS,BRUCE HENRY 01/25/2013 8:59 AM

## 2013-01-26 ENCOUNTER — Inpatient Hospital Stay (HOSPITAL_COMMUNITY): Payer: Medicare Other | Admitting: Physical Therapy

## 2013-01-26 ENCOUNTER — Inpatient Hospital Stay (HOSPITAL_COMMUNITY): Payer: Medicare Other

## 2013-01-26 ENCOUNTER — Inpatient Hospital Stay (HOSPITAL_COMMUNITY): Payer: Medicare Other | Admitting: Speech Pathology

## 2013-01-26 DIAGNOSIS — R5381 Other malaise: Secondary | ICD-10-CM

## 2013-01-26 DIAGNOSIS — M6282 Rhabdomyolysis: Secondary | ICD-10-CM

## 2013-01-26 MED ORDER — SALINE SPRAY 0.65 % NA SOLN
1.0000 | Freq: Four times a day (QID) | NASAL | Status: DC
  Administered 2013-01-26 – 2013-01-28 (×7): 1 via NASAL
  Filled 2013-01-26: qty 44

## 2013-01-26 NOTE — Progress Notes (Signed)
Occupational Therapy Session Note  Patient Details  Name: Rodney Potts MRN: [id number] Date of Birth: April 07, 1945  Today's Date: 01/26/2013 Time: 0730-0830 Time Calculation (min): 60 min  Short Term Goals: Week 1:  OT Short Term Goal 1 (Week 1): Patient will completed upper body bathing seated with min assist OT Short Term Goal 2 (Week 1): Patient will complete lower body bathing and dressing sitting and standing using AE, prn, with mod assist OT Short Term Goal 2 - Progress (Week 1): Met OT Short Term Goal 3 (Week 1): Patient will complete transfer from w/c to [facility] with min assist using LRAD OT Short Term Goal 4 (Week 1): Patient will complete upper body dressing with supervision OT Short Term Goal 5 (Week 1): Patient will complete toileting with mod assist  Skilled Therapeutic Interventions: ADL-retraining with emphasis on dynamic sitting/standing balance, functional mobility using RW during ADL, transfers, sequencing, and improved intellectual awareness.  Patient able to ambulate to bathroom using RW with close supervision and min cues to stay within base of support while ambulating.   Patient bathes seated on shower chair, with only supervision for safety but demonstrates L-LE weakness and impaired right lateral leans required to shift weight to perform dressing of left foot (sock, shoe, and fasten laces).    Therapy Documentation Precautions:  Precautions Precautions: Fall Precaution Comments:  h/o falls Restrictions Weight Bearing Restrictions: No  Vital Signs: Therapy Vitals Temp: 98.6 F (37 C) Temp src: Oral Pulse Rate: 66 Resp: 18 BP: 115/74 mmHg Patient Position, if appropriate: Lying Oxygen Therapy SpO2: 94 % O2 Device: None (Room air)  Pain: Pain Assessment Pain Assessment: No/denies pain  See FIM for current functional status  Therapy/Group: Individual Therapy  Second session: Time: 1345-1430 Time Calculation (min):  45 min  Pain Assessment: No  pain  Skilled Therapeutic Interventions: Therapeutic exercise (15 min) using NuStep, level 3-5, 15 min, 50-60 steps/min rate.   Patient reported mild-mod exertion after treatment.   ADL-retraining (30 min) with emphasis on functional mobility, spatial awareness, dynamic standing balance while using cane to ambulate during ADL.   Patient requested re-ed on trunk and hamstring stretches prior to ambulation and proceeded to walk using his cane and close supervision from ortho gym to rehab gym, from rehab gym to his bathroom, and from his bathroom to gift shop and back, using elevator to access 1st floor gift shop.   Patient ambulated w/o loss of balance although misjudging distances while reaching for objects and bumping his left shoulder on door jambs 2 times while walking through doorways.   Patient was challenged to explain his inattention to left but claimed he had no visual deficits other than being "cross-eyed" since childhood; patient continues to require re-ed on impact of impairments effecting safety.      See FIM for current functional status  Therapy/Group: Individual Therapy  Leonarda Leis 01/26/2013, 8:50 AM

## 2013-01-26 NOTE — Progress Notes (Signed)
Physical Therapy Session Note  Patient Details  Name: Rodney Potts MRN: [id number] Date of Birth: [birth year]  Today's Date: 01/26/2013 Time: 0830-0930 Time Calculation (min): 60 min   Skilled Therapeutic Interventions/Progress Updates:    Pt ready for rehab this morning. Initiailly ambulating in home environment (narrow spaces) and controlled environment with RW, min assist for unsafe utilization of AD (makes 180 degree turns, losses balance, places walker on foot). Trial of ambulation with hand hold assist and with cane pt min assist/min-guard assist and demonstrated improved balance, posture, and safety. Recommend pt utilize cane instead of RW at this time, safety plan updated. Obstacle course including agility ladder, very narrow obstacles, and 5 steps with one railing all performed with cane and min assist for balance. Pt continues to have decreased safety awareness and memory in addition to decreased Lt. LE clearance and impaired balance, anticipate pt will continue to need supervision with ambulation at home.   Therapy Documentation Precautions:  Precautions Precautions: Fall Precaution Comments:  h/o falls Restrictions Weight Bearing Restrictions: No Pain: Pain Assessment Pain Assessment: No/denies pain  See FIM for current functional status  Therapy/Group: Individual Therapy  Wilhemina Bonito 01/26/2013, 12:14 PM

## 2013-01-26 NOTE — Progress Notes (Signed)
Speech Language Pathology Daily Session Note  Patient Details  Name: Rodney Potts MRN: [id number] Date of Birth: 05-Jun-1945  Today's Date: 01/26/2013 Time: 1130-1230 Time Calculation (min): 60 min  Short Term Goals: Week 1: SLP Short Term Goal 1 (Week 1): Pt will utilize swallowing compensatory straegies to minimize overt s/s of aspiration with Min A verbal and question cues.  SLP Short Term Goal 2 (Week 1): Pt will identify 2 physical and 2 cognitive deficits with Mod A multimodal cueing SLP Short Term Goal 3 (Week 1): Pt will utilize call bell to express wants/needs with Min A question cues.  SLP Short Term Goal 4 (Week 1): Pt will demonstrate functional problem solving for basic and familiar tasks with Mod A mulltimodal cueing  SLP Short Term Goal 5 (Week 1): Pt will utilize external memory aids to increase recall new, daily information with Min A multimodal cueing.   Skilled Therapeutic Interventions: Treatment focus on dysphagia goals. SLP facilitated session by providing supervision question cues for recall of swallowing compensatory strategies and Mod-Max A verbal cues for utilization of chin tuck and multiple swallows throughout the meal. Suspect decreased utilization is due to a more distracting environment. Pt without overt s/s of aspiration and pt was Mod I for opening containers. Continue current plan of care.    FIM:  Comprehension Comprehension Mode: Auditory Comprehension: 4-Understands basic 75 - 89% of the time/requires cueing 10 - 24% of the time Expression Expression Mode: Verbal Expression: 5-Expresses basic 90% of the time/requires cueing < 10% of the time. Social Interaction Social Interaction: 4-Interacts appropriately 75 - 89% of the time - Needs redirection for appropriate language or to initiate interaction. Problem Solving Problem Solving: 3-Solves basic 50 - 74% of the time/requires cueing 25 - 49% of the time Memory Memory: 3-Recognizes or recalls 50 -  74% of the time/requires cueing 25 - 49% of the time FIM - Eating Eating Activity: 5: Supervision/cues;5: Set-up assist for open containers;5: Set-up assist for cut food  Pain Pain Assessment Pain Assessment: No/denies pain  Therapy/Group: Individual Therapy  Michiko Lineman 01/26/2013, 1:02 PM

## 2013-01-26 NOTE — Progress Notes (Signed)
Subjective/Complaints: 67 y.o. 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 Patient ambulated with a cane prior to admission. Admitted 01/15/2013 with diffuse generalized weakness and falls. Patient was found down by his family after extended time.  Cranial CT scan x2 negative. MRI of the brain 01/16/2013 showing stable changes of right posterior frontal and anterior temporal encephalomalacia remote a small right thalamic and left basal ganglia lacunar infarcts and extensive periventricular subcortical nonspecific white matter hyperintensities related to small vessel disease. Overall no significant changes compared to prior MRI scan 06/08/2012.  MRI cervical thoracic spine unremarkable. No further workup indicated as per neurology services. Maintained on a dysphagia 3 nectar thick liquid diet  Review of Systems - Negative except Left sided weakness  Objective: Vital Signs: Blood pressure 115/74, pulse 66, temperature 98.6 F (37 C), temperature source Oral, resp. rate 18, height 5\' 9"  (1.753 m), weight 82 kg (180 lb 12.4 oz), SpO2 94.00%. No results found. No results found for this or any previous visit (from the past 72 hour(s)).   HEENT: normal Cardio: RRR and no murmur Resp: CTA B/L and unlabored GI: BS positive and non distended Extremity:  No Edema Skin:   Intact Neuro: Alert/Oriented, Cranial Nerve Abnormalities Left central 7, Abnormal Motor Left Bi,tri,grip, HF, KE ADF, 4/5,  R side 5/5 and Abnormal FMC Ataxic/ dec FMC Musc/Skel:  Normal Gen NAD   Assessment/Plan: 1. Functional deficits secondary to Left hemiparesis exacerbation with syncopal episode, ? seizurewhich require 3+ hours per day of interdisciplinary therapy in a comprehensive inpatient rehab setting. Physiatrist is providing close team supervision and 24 hour management of active medical problems listed  below. Physiatrist and rehab team continue to assess barriers to discharge/monitor patient progress toward functional and medical goals. FIM: FIM - Bathing Bathing Steps Patient Completed: Chest;Right Arm;Left Arm;Abdomen;Front perineal area;Buttocks;Right upper leg;Left upper leg;Right lower leg (including foot);Left lower leg (including foot) Bathing: 5: Supervision: Safety issues/verbal cues  FIM - Upper Body Dressing/Undressing Upper body dressing/undressing steps patient completed: Thread/unthread right sleeve of pullover shirt/dresss;Thread/unthread left sleeve of pullover shirt/dress;Put head through opening of pull over shirt/dress;Pull shirt over trunk Upper body dressing/undressing: 5: Supervision: Safety issues/verbal cues FIM - Lower Body Dressing/Undressing Lower body dressing/undressing steps patient completed: Thread/unthread right underwear leg;Pull underwear up/down;Thread/unthread right pants leg;Thread/unthread left pants leg;Pull pants up/down;Don/Doff right sock;Don/Doff left sock;Don/Doff right shoe;Fasten/unfasten right shoe Lower body dressing/undressing: 4: Min-Patient completed 75 plus % of tasks  FIM - Toileting Toileting steps completed by patient: Adjust clothing prior to toileting;Performs perineal hygiene;Adjust clothing after toileting Toileting Assistive Devices: Grab bar or rail for support Toileting: 7: Independent: No helper, no device  FIM - Diplomatic Services operational officer Devices: Building control surveyor Transfers: 5-To toilet/BSC: Supervision (verbal cues/safety issues);5-From toilet/BSC: Supervision (verbal cues/safety issues)  FIM - Banker Devices: Therapist, occupational: 6: Supine > Sit: No assist;5: Bed > Chair or W/C: Supervision (verbal cues/safety issues)  FIM - Locomotion: Wheelchair Distance: 75 Locomotion: Wheelchair: 2: Travels 50 - 149 ft with minimal assistance (Pt.>75%) FIM  - Locomotion: Ambulation Locomotion: Ambulation Assistive Devices: Designer, industrial/product Ambulation/Gait Assistance: 4: Min assist Locomotion: Ambulation: 0: Activity did not occur  Comprehension Comprehension Mode: Auditory Comprehension: 4-Understands basic 75 - 89% of the time/requires cueing 10 - 24% of the time  Expression Expression Mode: Verbal Expression: 5-Expresses basic 90% of the time/requires cueing < 10% of the time.  Social  Interaction Social Interaction: 4-Interacts appropriately 75 - 89% of the time - Needs redirection for appropriate language or to initiate interaction.  Problem Solving Problem Solving: 3-Solves basic 50 - 74% of the time/requires cueing 25 - 49% of the time  Memory Memory: 3-Recognizes or recalls 50 - 74% of the time/requires cueing 25 - 49% of the time  Medical Problem List and Plan:  1. Rhabdomyolysis, deconditioning, history of brain glioma, and CVA  2. DVT Prophylaxis/Anticoagulation: SCDs. Monitor for any signs of DVT  3. Pain Management: Tylenol as needed. Monitor with increased mobility  4. Neuropsych: This patient is not capable of making decisions on his own behalf.  5. Dysphagia. Dysphagia 3 nectar thick liquids. Monitor for any signs of aspiration. Followup speech therapy  6. Seizure disorder. Keppra 750 mg twice a day, phenobarbital(luminal) 97.2 mg each bedtime. Monitor for any seizure activity  7. Mood. Aricept 5 mg each bedtime. Discuss cognitive baseline with family  8. Overactive bladder. Toviaz 8 mg each bedtime as prior to admission. Check bladder scans x3  9. Hyperlipidemia. Resume Lipitor  10. Gout, 1st right MTP----prednisone, colchicine for flare    LOS (Days) 6 A FACE TO FACE EVALUATION WAS PERFORMED  Rodney Potts E 01/26/2013, 9:40 AM

## 2013-01-27 ENCOUNTER — Inpatient Hospital Stay (HOSPITAL_COMMUNITY): Payer: Medicare Other

## 2013-01-27 ENCOUNTER — Inpatient Hospital Stay (HOSPITAL_COMMUNITY): Payer: Medicare Other | Admitting: *Deleted

## 2013-01-27 ENCOUNTER — Inpatient Hospital Stay (HOSPITAL_COMMUNITY): Payer: Medicare Other | Admitting: Physical Therapy

## 2013-01-27 ENCOUNTER — Inpatient Hospital Stay (HOSPITAL_COMMUNITY): Payer: Medicare Other | Admitting: Speech Pathology

## 2013-01-27 NOTE — Progress Notes (Signed)
Subjective/Complaints: 67 y.o. 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 Patient ambulated with a cane prior to admission. Admitted 01/15/2013 with diffuse generalized weakness and falls. Patient was found down by his family after extended time.  Cranial CT scan x2 negative. MRI of the brain 01/16/2013 showing stable changes of right posterior frontal and anterior temporal encephalomalacia remote a small right thalamic and left basal ganglia lacunar infarcts and extensive periventricular subcortical nonspecific white matter hyperintensities related to small vessel disease. Overall no significant changes compared to prior MRI scan 06/08/2012.  MRI cervical thoracic spine unremarkable. No further workup indicated as per neurology services. Maintained on a dysphagia 3 nectar thick liquid diet  Slept poorly, bladder freq, takes toviaz at home, denies dysuria  Review of Systems - Negative except Left sided weakness  Objective: Vital Signs: Blood pressure 130/80, pulse 76, temperature 98.4 F (36.9 C), temperature source Oral, resp. rate 16, height 5\' 9"  (1.753 m), weight 82 kg (180 lb 12.4 oz), SpO2 95.00%. No results found. No results found for this or any previous visit (from the past 72 hour(s)).   HEENT: normal Cardio: RRR and no murmur Resp: CTA B/L and unlabored GI: BS positive and non distended Extremity:  No Edema Skin:   Intact Neuro: Alert/Oriented, Cranial Nerve Abnormalities Left central 7, Abnormal Motor Left Bi,tri,grip, HF, KE ADF, 4/5,  R side 5/5 and Abnormal FMC Ataxic/ dec FMC Musc/Skel:  Normal Gen NAD   Assessment/Plan: 1. Functional deficits secondary to Left hemiparesis exacerbation with syncopal episode, ? seizurewhich require 3+ hours per day of interdisciplinary therapy in a comprehensive inpatient rehab setting. Physiatrist is providing close team supervision  and 24 hour management of active medical problems listed below. Physiatrist and rehab team continue to assess barriers to discharge/monitor patient progress toward functional and medical goals. FIM: FIM - Bathing Bathing Steps Patient Completed: Chest;Right Arm;Left Arm;Abdomen;Front perineal area;Buttocks;Right upper leg;Left upper leg;Right lower leg (including foot);Left lower leg (including foot) Bathing: 5: Supervision: Safety issues/verbal cues  FIM - Upper Body Dressing/Undressing Upper body dressing/undressing steps patient completed: Thread/unthread right sleeve of pullover shirt/dresss;Thread/unthread left sleeve of pullover shirt/dress;Put head through opening of pull over shirt/dress;Pull shirt over trunk Upper body dressing/undressing: 5: Supervision: Safety issues/verbal cues FIM - Lower Body Dressing/Undressing Lower body dressing/undressing steps patient completed: Thread/unthread right underwear leg;Pull underwear up/down;Thread/unthread right pants leg;Thread/unthread left pants leg;Pull pants up/down;Don/Doff right sock;Don/Doff left sock;Don/Doff right shoe;Fasten/unfasten right shoe Lower body dressing/undressing: 4: Min-Patient completed 75 plus % of tasks  FIM - Toileting Toileting steps completed by patient: Adjust clothing prior to toileting;Performs perineal hygiene;Adjust clothing after toileting Toileting Assistive Devices: Grab bar or rail for support Toileting: 4: Steadying assist  FIM - Diplomatic Services operational officer Devices: Building control surveyor Transfers: 5-To toilet/BSC: Supervision (verbal cues/safety issues);5-From toilet/BSC: Supervision (verbal cues/safety issues)  FIM - Banker Devices: Therapist, occupational: 6: Supine > Sit: No assist;5: Bed > Chair or W/C: Supervision (verbal cues/safety issues)  FIM - Locomotion: Wheelchair Distance: 75 Locomotion: Wheelchair: 2: Travels 50 - 149  ft with minimal assistance (Pt.>75%) FIM - Locomotion: Ambulation Locomotion: Ambulation Assistive Devices: Emergency planning/management officer Ambulation/Gait Assistance: 4: Min assist Locomotion: Ambulation: 4: Travels 150 ft or more with minimal assistance (Pt.>75%)  Comprehension Comprehension Mode: Auditory Comprehension: 4-Understands basic 75 - 89% of the time/requires cueing 10 - 24% of the time  Expression Expression Mode: Verbal Expression: 5-Expresses  basic 90% of the time/requires cueing < 10% of the time.  Social Interaction Social Interaction: 4-Interacts appropriately 75 - 89% of the time - Needs redirection for appropriate language or to initiate interaction.  Problem Solving Problem Solving: 3-Solves basic 50 - 74% of the time/requires cueing 25 - 49% of the time  Memory Memory: 3-Recognizes or recalls 50 - 74% of the time/requires cueing 25 - 49% of the time  Medical Problem List and Plan:  1. Rhabdomyolysis, deconditioning, history of brain glioma, and CVA  2. DVT Prophylaxis/Anticoagulation: SCDs. Monitor for any signs of DVT  3. Pain Management: Tylenol as needed. Monitor with increased mobility  4. Neuropsych: This patient is not capable of making decisions on his own behalf.  5. Dysphagia. Dysphagia 3 nectar thick liquids. Monitor for any signs of aspiration. Followup speech therapy  6. Seizure disorder. Keppra 750 mg twice a day, phenobarbital(luminal) 97.2 mg each bedtime. Monitor for any seizure activity  7. Mood. Aricept 5 mg each bedtime. Discuss cognitive baseline with family  8. Overactive bladder. Toviaz 8 mg each bedtime as prior to admission. Check bladder scans x3  9. Hyperlipidemia. Resume Lipitor  10. Gout, 1st right MTP----prednisone, colchicine for flare , resolved will d/c   LOS (Days) 7 A FACE TO FACE EVALUATION WAS PERFORMED  Allien Melberg E 01/27/2013, 9:19 AM

## 2013-01-27 NOTE — Progress Notes (Signed)
Physical Therapy Session Note  Patient Details  Name: Rodney Potts MRN: [id number] Date of Birth: Aug 18, 1945  Today's Date: 01/27/2013 Time: 1000-1100 Time Calculation (min): 60 min Skilled Therapeutic Interventions/Progress Updates:    Pt received supine in bed. Supine to sit with no assist, sit to stand with [facility] with supervision A. Gait with SPC and min assist to and from rehab gym, obstacle course in gym, dynamic gait activities, up and down ramp and curb with SPC with vcs to improve sequencing as pt tends to rush through task displaying slight LOB with self recovery. Pt performed standing static/dynamic balance activities. Tall kneeling, quadruped with alternating B UE, BLEs and then combined alternating x 10 each with brief rests. Lumbar and core stability strengthening in supine and on theraball. Pt very motivated and has made good progress. Pt returned to room with bed alarm engaged and needs within reach.   Therapy Documentation Precautions:  Precautions Precautions: Fall Precaution Comments:  h/o falls Restrictions Weight Bearing Restrictions: No     Pain: Pain Assessment Pain Assessment: No/denies pain Pain Score: 2     Locomotion : Ambulation Ambulation/Gait Assistance: 4: Min assist             See FIM for current functional status  Therapy/Group: Individual Therapy  Jackelyn Knife 01/27/2013, 4:09 PM

## 2013-01-27 NOTE — Progress Notes (Signed)
Speech Language Pathology Daily Session Note  Patient Details  Name: Rodney Potts MRN: [id number] Date of Birth: [birth year]  Today's Date: 01/27/2013 Time: 1478-2956 Time Calculation (min): 30 min  Short Term Goals: Week 1: SLP Short Term Goal 1 (Week 1): Pt will utilize swallowing compensatory straegies to minimize overt s/s of aspiration with Min A verbal and question cues.  SLP Short Term Goal 2 (Week 1): Pt will identify 2 physical and 2 cognitive deficits with Mod A multimodal cueing SLP Short Term Goal 3 (Week 1): Pt will utilize call bell to express wants/needs with Min A question cues.  SLP Short Term Goal 4 (Week 1): Pt will demonstrate functional problem solving for basic and familiar tasks with Mod A mulltimodal cueing  SLP Short Term Goal 5 (Week 1): Pt will utilize external memory aids to increase recall new, daily information with Min A multimodal cueing.   Skilled Therapeutic Interventions: Treatment focused on swallowing and cognitive goals. SLP facilitated session with supervision level question cues for recall of swallowing compensatory strategies. Pt consumed a banana and nectar thick juice with Mod cues for utilization of strategies, with no overt s/s of aspiration noted. Pt verbalized that it is hard for him to utilize swallowing strategies because they seem so different from how he has always been eating/drinking. SLP provided education regarding the importance of having someone to remind him about utilizing his strategies upon d/c home, which is now scheduled for tomorrow.    FIM:  Comprehension Comprehension Mode: Auditory Comprehension: 5-Follows basic conversation/direction: With extra time/assistive device Expression Expression Mode: Verbal Expression: 5-Expresses basic 90% of the time/requires cueing < 10% of the time. Social Interaction Social Interaction: 5-Interacts appropriately 90% of the time - Needs monitoring or encouragement for participation or  interaction. Problem Solving Problem Solving: 4-Solves basic 75 - 89% of the time/requires cueing 10 - 24% of the time Memory Memory: 4-Recognizes or recalls 75 - 89% of the time/requires cueing 10 - 24% of the time FIM - Eating Eating Activity: 5: Needs verbal cues/supervision  Pain Pain Assessment Pain Assessment: No/denies pain  Therapy/Group: Individual Therapy  Maxcine Ham 01/27/2013, 5:46 PM  Maxcine Ham, M.A. CCC-SLP [phone]

## 2013-01-27 NOTE — Progress Notes (Signed)
Speech Language Pathology Daily Session Note  Patient Details  Name: Rodney Potts MRN: [id number] Date of Birth: 04-Jan-1946  Today's Date: 01/27/2013 Time: 1130-1215 Time Calculation (min): 45 min  Short Term Goals: Week 1: SLP Short Term Goal 1 (Week 1): Pt will utilize swallowing compensatory straegies to minimize overt s/s of aspiration with Min A verbal and question cues.  SLP Short Term Goal 2 (Week 1): Pt will identify 2 physical and 2 cognitive deficits with Mod A multimodal cueing SLP Short Term Goal 3 (Week 1): Pt will utilize call bell to express wants/needs with Min A question cues.  SLP Short Term Goal 4 (Week 1): Pt will demonstrate functional problem solving for basic and familiar tasks with Mod A mulltimodal cueing  SLP Short Term Goal 5 (Week 1): Pt will utilize external memory aids to increase recall new, daily information with Min A multimodal cueing.   Skilled Therapeutic Interventions: Treatment focus on dysphagia and cognitive goals. SLP facilitated session by providing supervision question cues for recall of swallowing compensatory strategies and Min-Mod A verbal cues for utilization of chin tuck and multiple swallows throughout the meal. Pt demonstrated increased utilization of swallowing compensatory strategies, suspect due to increased selective attention to self-feeding task. Pt demonstrated a wet vocal quality X 1 and required total A verbal cues to utilize a throat clear to eliminate suspected penetrates. Continue current plan of care.    FIM:  Comprehension Comprehension Mode: Auditory Comprehension: 5-Understands basic 90% of the time/requires cueing < 10% of the time Expression Expression Mode: Verbal Expression: 5-Expresses basic 90% of the time/requires cueing < 10% of the time. Social Interaction Social Interaction: 5-Interacts appropriately 90% of the time - Needs monitoring or encouragement for participation or interaction. Problem Solving Problem  Solving: 4-Solves basic 75 - 89% of the time/requires cueing 10 - 24% of the time Memory Memory: 4-Recognizes or recalls 75 - 89% of the time/requires cueing 10 - 24% of the time FIM - Eating Eating Activity: 5: Supervision/cues  Pain Pain Assessment Pain Assessment: No/denies pain  Therapy/Group: Individual Therapy  Rodney Potts 01/27/2013, 1:33 PM

## 2013-01-27 NOTE — Progress Notes (Signed)
Occupational Therapy Session Note  Patient Details  Name: Rodney Potts MRN: [id number] Date of Birth: 02/15/1946  Today's Date: 01/27/2013 Time: 0900-0958 Time Calculation (min): 58 min  Short Term Goals: Week 1:  OT Short Term Goal 1 (Week 1): Patient will completed upper body bathing seated with min assist OT Short Term Goal 2 (Week 1): Patient will complete lower body bathing and dressing sitting and standing using AE, prn, with mod assist OT Short Term Goal 2 - Progress (Week 1): Met OT Short Term Goal 3 (Week 1): Patient will complete transfer from w/c to [facility] with min assist using LRAD OT Short Term Goal 4 (Week 1): Patient will complete upper body dressing with supervision OT Short Term Goal 5 (Week 1): Patient will complete toileting with mod assist  Skilled Therapeutic Interventions/Progress Updates:  ADL-retraining with emphasis on improved bathing/dressing skills, improved transfers, improved endurance, and improved safety awareness.   Patient completed bathing in shower, seated on shower chair, with supervision for safety.   During sitting/standing in shower, patient hit his head 3 times while leaning forward to wash his feet, recover washcloth and soap from floor, and to stand.   Patient required setup assist to readjust shower chair to prevent continued contact with his head on grab bars and tile.   Patient able to dress, sitting at edge of bed with verbal cue to remove non-skid socks to improve ease of lower body dressing.   Patient able to fully dress lower body this session, to include left pant legs, sock, shoe and laces.    Therapy Documentation Precautions:  Precautions Precautions: Fall Precaution Comments:  h/o falls Restrictions Weight Bearing Restrictions: No  Pain: Pain Assessment Pain Assessment: No/denies pain  See FIM for current functional status  Therapy/Group: Individual Therapy  Second session: Time: 1300-1330 Time Calculation (min):  30  min  Pain Assessment: No pain  Skilled Therapeutic Interventions: Therapeutic activities with emphasis on improved emergent/intellectual awareness of deficits, functional mobility, attention to left, fall recovery, and discharge planning.   Patient ambulated with cane to ortho gym while discussing with OT architectural differences with current home and new home.   Patient reports that he believes he has recovered full function (at his baseline) and plans to resume prior habit of retrieving wood for his wood-burning stove.  Patient denied presentation of evidence indicating ongoing fall risk, balance deficits, and left neglect as revealed during therapy sessions.   Patient denied potential for fall but consented to fall recovery training and was attentive to patient education on need for and use of emergency medical alert system (Guardian 911 pendant demo'd).    Patient required verbal cues to recover from supine on floor to sitting at edge of raised mat.  Patient now ambulating with brisk stride using his cane but continues to require supervision to avoid obstacles and provide space for oncoming traffic in hallways.  See FIM for current functional status  Therapy/Group: Individual Therapy  Rodney Potts 01/27/2013, 9:59 AM

## 2013-01-27 NOTE — Progress Notes (Signed)
Recreational Therapy Session Note  Patient Details  Name: Rodney Potts MRN: [id number] Date of Birth: 1945-08-03 Today's Date: 01/27/2013  Pain: no c/o  Skilled Therapeutic Interventions/Progress Updates: Order received and chart reviewed.  Met with pt and discussed leisure interests.  Pt is able to state potential leisure activities for participation post discharge.  No formal TR treatment plan implemented at this time due to ELOS. Will continue to monitor.  Johnae Friley 01/27/2013, 4:15 PM

## 2013-01-27 NOTE — Discharge Summary (Signed)
Discharge summary job # [id number]

## 2013-01-28 ENCOUNTER — Inpatient Hospital Stay (HOSPITAL_COMMUNITY): Payer: Medicare Other

## 2013-01-28 ENCOUNTER — Inpatient Hospital Stay (HOSPITAL_COMMUNITY): Payer: Medicare Other | Admitting: Speech Pathology

## 2013-01-28 ENCOUNTER — Inpatient Hospital Stay (HOSPITAL_COMMUNITY): Payer: Medicare Other | Admitting: Physical Therapy

## 2013-01-28 MED ORDER — DONEPEZIL HCL 5 MG PO TABS: 5.0000 mg | ORAL_TABLET | Freq: Every day | ORAL | Status: DC

## 2013-01-28 MED ORDER — LEVETIRACETAM 750 MG PO TABS: 750.0000 mg | ORAL_TABLET | Freq: Two times a day (BID) | ORAL | Status: DC

## 2013-01-28 MED ORDER — CLOPIDOGREL BISULFATE 75 MG PO TABS: 75.0000 mg | ORAL_TABLET | Freq: Every day | ORAL | Status: DC

## 2013-01-28 MED ORDER — ATORVASTATIN CALCIUM 40 MG PO TABS: 40.0000 mg | ORAL_TABLET | Freq: Every day | ORAL | Status: AC

## 2013-01-28 MED ORDER — PHENOBARBITAL 97.2 MG PO TABS: 97.2000 mg | ORAL_TABLET | Freq: Every day | ORAL | Status: DC

## 2013-01-28 MED ORDER — FESOTERODINE FUMARATE ER 8 MG PO TB24: 8.0000 mg | ORAL_TABLET | Freq: Every day | ORAL | Status: AC

## 2013-01-28 NOTE — Progress Notes (Signed)
Subjective/Complaints: 67 y.o. 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 Patient ambulated with a cane prior to admission. Admitted 01/15/2013 with diffuse generalized weakness and falls. Patient was found down by his family after extended time.  Cranial CT scan x2 negative. MRI of the brain 01/16/2013 showing stable changes of right posterior frontal and anterior temporal encephalomalacia remote a small right thalamic and left basal ganglia lacunar infarcts and extensive periventricular subcortical nonspecific white matter hyperintensities related to small vessel disease. Overall no significant changes compared to prior MRI scan 06/08/2012.  MRI cervical thoracic spine unremarkable. No further workup indicated as per neurology services. Maintained on a dysphagia 3 nectar thick liquid diet  No issues overnite  Review of Systems - Negative except Left sided weakness  Objective: Vital Signs: Blood pressure 107/75, pulse 68, temperature 98.3 F (36.8 C), temperature source Oral, resp. rate 18, height 5\' 9"  (1.753 m), weight 79.198 kg (174 lb 9.6 oz), SpO2 96.00%. No results found. No results found for this or any previous visit (from the past 72 hour(s)).   HEENT: normal Cardio: RRR and no murmur Resp: CTA B/L and unlabored GI: BS positive and non distended Extremity:  No Edema Skin:   Intact Neuro: Alert/Oriented, Cranial Nerve Abnormalities Left central 7, Abnormal Motor Left Bi,tri,grip, HF, KE ADF, 4/5,  R side 5/5 and Abnormal FMC Ataxic/ dec FMC Musc/Skel:  Normal Gen NAD   Assessment/Plan: 1. Functional deficits secondary to Left hemiparesis exacerbation with syncopal episode, ? seizurewhich require 3+ hours per day of interdisciplinary therapy in a comprehensive inpatient rehab setting. Physiatrist is providing close team supervision and 24 hour management of active medical  problems listed below. Physiatrist and rehab team continue to assess barriers to discharge/monitor patient progress toward functional and medical goals. Stable for D/C today F/u PCP in 1-2 weeks F/u PM&R 3 weeks F/u neuro See D/C summary See D/C instructions FIM: FIM - Bathing Bathing Steps Patient Completed: Chest;Right Arm;Left Arm;Abdomen;Front perineal area;Buttocks;Right upper leg;Left upper leg;Right lower leg (including foot);Left lower leg (including foot) Bathing: 5: Supervision: Safety issues/verbal cues  FIM - Upper Body Dressing/Undressing Upper body dressing/undressing steps patient completed: Thread/unthread right sleeve of pullover shirt/dresss;Thread/unthread left sleeve of pullover shirt/dress;Put head through opening of pull over shirt/dress;Pull shirt over trunk Upper body dressing/undressing: 5: Supervision: Safety issues/verbal cues FIM - Lower Body Dressing/Undressing Lower body dressing/undressing steps patient completed: Thread/unthread right underwear leg;Thread/unthread left underwear leg;Pull underwear up/down;Thread/unthread right pants leg;Thread/unthread left pants leg;Pull pants up/down;Don/Doff right sock;Don/Doff left sock;Don/Doff right shoe;Don/Doff left shoe;Fasten/unfasten right shoe;Fasten/unfasten left shoe Lower body dressing/undressing: 5: Supervision: Safety issues/verbal cues  FIM - Toileting Toileting steps completed by patient: Adjust clothing prior to toileting;Performs perineal hygiene;Adjust clothing after toileting Toileting Assistive Devices: Grab bar or rail for support Toileting: 5: Supervision: Safety issues/verbal cues  FIM - Diplomatic Services operational officer Devices: Grab bars;Cane Toilet Transfers: 5-To toilet/BSC: Supervision (verbal cues/safety issues);5-From toilet/BSC: Supervision (verbal cues/safety issues)  FIM - Banker Devices: Cane [facility]) Bed/Chair Transfer: 7: Supine >  Sit: No assist;5: Bed > Chair or W/C: Supervision (verbal cues/safety issues)  FIM - Locomotion: Wheelchair Distance: 75 Locomotion: Wheelchair: 0: Activity did not occur FIM - Locomotion: Ambulation Locomotion: Ambulation Assistive Devices: Emergency planning/management officer Ambulation/Gait Assistance: 4: Min assist Locomotion: Ambulation: 4: Travels 150 ft or more with minimal assistance (Pt.>75%)  Comprehension Comprehension Mode: Auditory Comprehension: 5-Follows basic conversation/direction: With extra time/assistive  device  Expression Expression Mode: Verbal Expression: 5-Expresses basic 90% of the time/requires cueing < 10% of the time.  Social Interaction Social Interaction: 5-Interacts appropriately 90% of the time - Needs monitoring or encouragement for participation or interaction.  Problem Solving Problem Solving: 4-Solves basic 75 - 89% of the time/requires cueing 10 - 24% of the time  Memory Memory: 4-Recognizes or recalls 75 - 89% of the time/requires cueing 10 - 24% of the time  Medical Problem List and Plan:  1. Rhabdomyolysis, deconditioning, history of brain glioma, and CVA  2. DVT Prophylaxis/Anticoagulation: SCDs. Monitor for any signs of DVT  3. Pain Management: Tylenol as needed. Monitor with increased mobility  4. Neuropsych: This patient is not capable of making decisions on his own behalf.  5. Dysphagia. Dysphagia 3 nectar thick liquids. Monitor for any signs of aspiration. Followup speech therapy  6. Seizure disorder. Keppra 750 mg twice a day, phenobarbital(luminal) 97.2 mg each bedtime. Monitor for any seizure activity, phenobarb   Level ok in acute, Keppra level was increased from home dose of 500mg  7. Mood. Aricept 5 mg each bedtime. Discuss cognitive baseline with family  8. Overactive bladder. Toviaz 8 mg each bedtime as prior to admission. Check bladder scans x3  9. Hyperlipidemia. Resume Lipitor  10. Gout, 1st right MTP----prednisone, colchicine for flare ,  resolved will d/c   LOS (Days) 8 A FACE TO FACE EVALUATION WAS PERFORMED  KIRSTEINS,ANDREW E 01/28/2013, 12:51 PM

## 2013-01-28 NOTE — Progress Notes (Signed)
Occupational Therapy Session Note  Patient Details  Name: Rodney Potts MRN: [id number] Date of Birth: [birth year]  Today's Date: 01/28/2013 Time: 1000-1040 Time Calculation (min): 40 min  Short Term Goals: Week 1:  OT Short Term Goal 1 (Week 1): Patient will completed upper body bathing seated with min assist OT Short Term Goal 2 (Week 1): Patient will complete lower body bathing and dressing sitting and standing using AE, prn, with mod assist OT Short Term Goal 2 - Progress (Week 1): Met OT Short Term Goal 3 (Week 1): Patient will complete transfer from w/c to [facility] with min assist using LRAD OT Short Term Goal 4 (Week 1): Patient will complete upper body dressing with supervision OT Short Term Goal 5 (Week 1): Patient will complete toileting with mod assist  Skilled Therapeutic Interventions: Patient and family education completed during this session with emphasis on patient's cognitive deficits relating to safety awareness, intellectual awareness of deficits, performance of ADL (supervisory level) and use of AE, as needed.   During session, patient demo'd ability to rise from standard toilet using only his cane.  Patient also performed bed mobility in ADL apartment without need for transfer handle or rails.   OT educated wife on patient's variable performance and recommended several devices (elevated toilet seat and transfer handle for bed) if his performance deteriorates.  Patient's wife witnessed patient's performance with transfers and mobility as well as his repeated denial of impact of deficits relating to falls or need for assistance with ADL.    OT clarified need for supervision relating to poor safety awareness, impulsivity, impaired dynamic standing balance, neglect of left during ADL, and inattention to obstacles while ambulating with use of cane.   Spouse verbalized understanding and is currently deliberating on whether to retire to provide supervision needed or continue  working.  Therapy Documentation Precautions:  Precautions Precautions: Fall Precaution Comments:  h/o falls Restrictions Weight Bearing Restrictions: No  Pain: No pain   See FIM for current functional status  Therapy/Group: Individual Therapy  Second session: Time: 1300-1345 Time Calculation (min):  45 min  Pain Assessment: No pain  Skilled Therapeutic Interventions: Therapeutic activities with emphasis on safety awareness, discharge planning, and endurance.   Patient greeted in his room while he remained in bed, long-sitting, completing his lunch meal.   Patient reported satisfaction with self-feeding and recovered to edge of bed to don shoes, ambulate to bathroom with cane, completed toileting and returned to sink to complete standing grooming (hand washing and oral care).   Patient ambulated to ortho gym and completed 10 min of general conditioning using NuStep, level 5, reporting improved UE function after exercise.   Patient maintained use of cane throughout session but required min cues to avoid obstacles and oncoming traffic while ambulating in hallway.   Patient denies remaining deficits will impact his performance of ADL or increase falls risk despite evidence presented during treatment.    See FIM for current functional status  Therapy/Group: Individual Therapy  Anayi Bricco 01/28/2013, 10:48 AM

## 2013-01-28 NOTE — Progress Notes (Addendum)
Physical Therapy Discharge Summary  Patient Details  Name: Rodney Potts MRN: [id number] Date of Birth: 02-13-1946  Today's Date: 01/28/2013 Time: 1100-1155 Time Calculation (min): 55 min  Family education ambulation x >200' with cane, min-guard assist, car transfer, flight of stairs cues for positioning and sequencing. Floor transfer x 2 min-guard assist. Discussed importance of supervision/minguard assist until cleared by outpatient therapies. They report they do not feel they want a wheelchair yet but will re-address with outpatient PT if a need arises.   Patient has met 4 of 7 long term goals due to improved activity tolerance, improved balance, improved postural control, increased strength, ability to compensate for deficits, improved attention and improved awareness.  Patient to discharge at an ambulatory level Min-guard Assist.   Patient's care partner is independent to provide the necessary physical and cognitive assistance at discharge.  Reasons goals not met: Pt continues to require min-guard assist with ambulation and stairs, wife demonstrated appropriate assist and cues. They feel comfortable going home at this level however he will benefit from ongoing balance and safety deficits.   Recommendation:  Patient will benefit from ongoing skilled PT services in outpatient setting to continue to advance safe functional mobility, address ongoing impairments in dynamic and static balance (Berg Balance Test 40/56), safety awareness, attention, and minimize fall risk.  Equipment: No equipment provided  Reasons for discharge: discharge from hospital  Patient/family agrees with progress made and goals achieved: Yes  PT Discharge Precautions/Restrictions Precautions Precautions: Fall Precaution Comments:  h/o falls Pain Pain Assessment Pain Assessment: No/denies pain  Cognition Overall Cognitive Status: History of cognitive impairments - at baseline (wife reports cognition is at  baseline) Arousal/Alertness: Awake/alert Orientation Level: Oriented X4 Attention: Selective Sustained Attention: Appears intact Selective Attention: Impaired Selective Attention Impairment: Verbal basic;Functional basic Memory: Impaired Memory Impairment: Decreased short term memory;Decreased recall of new information Decreased Short Term Memory: Verbal basic;Functional basic Awareness: Impaired Awareness Impairment: Emergent impairment Problem Solving: Impaired Problem Solving Impairment: Verbal basic;Functional basic Executive Function: Organizing;Self Monitoring;Self Correcting;Sequencing Sequencing: Impaired Sequencing Impairment: Functional basic Organizing: Impaired Organizing Impairment: Functional basic Self Monitoring: Impaired Self Monitoring Impairment: Functional basic Self Correcting: Impaired Self Correcting Impairment: Functional basic Safety/Judgment: Impaired Comments: Pt does not feel he needs supervision/assist at home however demonstrates decreased dynamic balance with ambulation particularly with distraction and is not safe to be by self.  Sensation Sensation Light Touch: Appears Intact Proprioception: Impaired by gross assessment Additional Comments: Decreased proproprioception of lt. LE during ambulation  Coordination Gross Motor Movements are Fluid and Coordinated: No Coordination and Movement Description: Impaired coordination of Lt. LE and truncal control particularly noticable during ambulation Motor  Motor Motor:  (Lt. hemiparesis) Motor - Skilled Clinical Observations: generalized weakness  Mobility Bed Mobility Bed Mobility: Supine to Sit;Sit to Supine Supine to Sit: 6: Modified independent (Device/Increase time) Sit to Supine: 6: Modified independent (Device/Increase time) Transfers Transfers: Yes Sit to Stand: 5: Supervision Sit to Stand Details (indicate cue type and reason): Supervision for safety, some intermittant imbalance Stand to  Sit: 5: Supervision Stand to Sit Details: Cues for squaring prior to sitting, safety. Locomotion  Ambulation Ambulation: Yes Ambulation/Gait Assistance: 4: Min guard Ambulation Distance (Feet): 200 Feet Assistive device: Straight cane Ambulation/Gait Assistance Details: Cues for safety with speed and stopping with distractions.  Gait Gait: Yes Gait Pattern: Impaired Gait Pattern: Trunk rotated posteriorly on left;Trunk flexed;Shuffle;Decreased stride length (Decreased Lt. LE clearance) Stairs / Additional Locomotion Stairs: Yes Stairs Assistance: 4: Min guard Stairs Assistance Details: Verbal cues  for technique;Verbal cues for precautions/safety;Verbal cues for gait pattern Stairs Assistance Details (indicate cue type and reason): pt requires cues for sequencing of cane and LEs, sometimes places cane two steps below step or leaves behind self increasing risk for falls. Wife able to appropriately cues pt.  Stair Management Technique: One rail Left;With cane Number of Stairs: 12 Wheelchair Mobility Wheelchair Mobility: No  Trunk/Postural Assessment  Cervical Assessment Cervical Assessment:  (forwards flexed) Lumbar Assessment Lumbar Assessment:  (posterior pelvic tilt) Postural Control Postural Control: Deficits on evaluation Righting Reactions: delayed  Balance Balance Balance Assessed: Yes Standardized Balance Assessment Standardized Balance Assessment: Berg Balance Test Berg Balance Test Sit to Stand: Able to stand  independently using hands Standing Unsupported: Able to stand safely 2 minutes Sitting with Back Unsupported but Feet Supported on Floor or Stool: Able to sit safely and securely 2 minutes Stand to Sit: Sits safely with minimal use of hands Transfers: Able to transfer safely, minor use of hands Standing Unsupported with Eyes Closed: Able to stand 10 seconds with supervision Standing Ubsupported with Feet Together: Able to place feet together independently and  stand for 1 minute with supervision From Standing, Reach Forward with Outstretched Arm: Can reach forward >5 cm safely (2") From Standing Position, Pick up Object from Floor: Able to pick up shoe, needs supervision From Standing Position, Turn to Look Behind Over each Shoulder: Looks behind from both sides and weight shifts well Turn 360 Degrees: Needs close supervision or verbal cueing Standing Unsupported, Alternately Place Feet on Step/Stool: Able to complete >2 steps/needs minimal assist Standing Unsupported, One Foot in Front: Able to plae foot ahead of the other independently and hold 30 seconds Standing on One Leg: Tries to lift leg/unable to hold 3 seconds but remains standing independently Total Score: 40 Dynamic Standing Balance Dynamic Standing - Balance Support: During functional activity Dynamic Standing - Comments: lateral and posterior losses of balance particularly with distraction.  Extremity Assessment      RLE Assessment RLE Assessment:  (grossly 3-/5) LLE Assessment LLE Assessment:  (grossly 4-/5 but appears weaker than Rt. functionally)  See FIM for current functional status  Wilhemina Bonito 01/28/2013, 4:00 PM

## 2013-01-28 NOTE — Procedures (Addendum)
Objective Swallowing Evaluation: Modified Barium Swallowing Study  Patient Details  Name: Rodney Potts MRN: [id number] Date of Birth: 1945/06/25  Today's Date: 01/28/2013 Time: 0900-0915 Time Calculation (min): 15 min  Past Medical History:  Past Medical History  Diagnosis Date  . Brain tumor     glioma; S/P radiation and chemotherapy (01/14/2013)  . TIA (transient ischemic attack) ~ 2011  . IBS (irritable bowel syndrome)   . Shingles   . CVA (cerebral infarction)     R thalamic  . Allergy   . Complication of anesthesia     "after knee OR woke up very wild; pulling things out, etc" (01/14/2013)  . High cholesterol   . Sleep apnea     "corrected w/OR" (01/14/2013)  . Seizures     "hx from the scar tissue where brain tumor was; on RX to control" (01/14/2013)  . Stroke     "he's had a couple; still a little weaker on the left leg, drags that foot" (01/14/2013)  . Prostate cancer    Past Surgical History:  Past Surgical History  Procedure Laterality Date  . Cataract extraction w/ intraocular lens implant Bilateral 2000's    "don't know if they put lens in" (01/14/2013)  . Joint replacement    . Prostatectomy  1990's  . Uvulopalatopharyngoplasty  1980's    "for sleep apnea" (01/14/2013)  . Shoulder arthroscopy Right 2000's  . Total knee arthroplasty Bilateral 2000's   HPI:  Pt is a 67 y.o. male with a history of brain tumor status post radiation in 1982 who has been having difficulty with falls over the past year. He had a stroke in April of this year, and had difficulty following that but had improved to the point that he was able to walk without a cane. Over the past couple weeks, however, he has had worsening of his gait and then he had a fall down some stairs and was unable to get up from the bottom. On the morning of 01/15/13, pt was found to have altered mentation and per RN has seemed to have difficulty managing his secretions since that time.  Pt had MBS on 11/14 and  was recommended Dys. 3 textures with nectar-thick liquids. Pt transferred to CIR 01/20/13 and has been participating in dysphagia treatment. Repeat MBS today to assess for possible upgrade.       Recommendation/Prognosis  Clinical Impression Dysphagia Diagnosis: Mild oral phase dysphagia;Mild pharyngeal phase dysphagia Clinical impression: Pt. demonstrated an improvement in swallowing function and presents with a mild oral and pharyngeal dysphagia. Oral deficits are characterized by prolonged mastication with delayed oral transit with regular textures, however, pt able to independently self-monitor and correct minimal oral residue with a second swallow.  Pt also demonstrates a delayed swallow initiation with all consistencies, however, pt demonstrated minimal silent penetration of thin liquids X 1 throughout the entire session that cleared with a cued cough. Minimal pharyngeal residue was noted throughout the session with puree and regular consistencies, however, pt independently utilized a second swallow to clear.  Recommend upgrade to regular textures with thin liquids with intermittent supervision for utilization of small bites/sips, multiple swallows and a slow pace.  Swallow Evaluation Recommendations Diet Recommendations: Regular;Thin liquid Liquid Administration via: Cup;Straw Medication Administration: Whole meds with puree Supervision: Patient able to self feed;Intermittent supervision to cue for compensatory strategies Compensations: Slow rate;Small sips/bites;Multiple dry swallows after each bite/sip Postural Changes and/or Swallow Maneuvers: Seated upright 90 degrees Oral Care Recommendations: Oral care BID Follow up  Recommendations: None Prognosis Prognosis for Safe Diet Advancement: Good Individuals Consulted Consulted and Agree with Results and Recommendations: Patient;Family member/caregiver Family Member Consulted: wife  SLP Assessment/Plan   General:  Date of Onset:  01/15/13 HPI: Pt is a 67 y.o. male with a history of brain tumor status post radiation in 1982 who has been having difficulty with falls over the past year. He had a stroke in April of this year, and had difficulty following that but had improved to the point that he was able to walk without a cane. Over the past couple weeks, however, he has had worsening of his gait and then he had a fall down some stairs and was unable to get up from the bottom. On the morning of 01/15/13, pt was found to have altered mentation and per RN has seemed to have difficulty managing his secretions since that time.  Pt had MBS on 11/14 and was recommended Dys. 3 textures with nectar-thick liquids. Pt transferred to CIR 01/20/13 and has been participating in dysphagia treatment. Repeat MBS today to assess for possible upgrade.   Type of Study: Modified Barium Swallowing Study Reason for Referral: Objectively evaluate swallowing function Previous Swallow Assessment: MBS on 11/14 and recommended Dys. 3 textures with nectar-thick liquids  Diet Prior to this Study: Regular;Thin liquids Temperature Spikes Noted: No Respiratory Status: Room air History of Recent Intubation: No Behavior/Cognition: Alert;Cooperative;Pleasant mood;Requires cueing Oral Cavity - Dentition: Adequate natural dentition Oral Motor / Sensory Function: Impaired - see Bedside swallow eval Self-Feeding Abilities: Able to feed self Patient Positioning: Upright in chair Baseline Vocal Quality: Clear Volitional Cough: Strong Volitional Swallow: Able to elicit Anatomy: Within functional limits Pharyngeal Secretions: Not observed secondary MBS  Reason for Referral:  Objectively evaluate swallowing function   Oral Phase Oral Preparation/Oral Phase Oral Phase: Impaired Oral - Solids Oral - Regular: Delayed oral transit Pharyngeal Phase  Pharyngeal Phase Pharyngeal Phase: Impaired Pharyngeal - Honey Pharyngeal - Honey Teaspoon: Not  tested Pharyngeal - Honey Cup: Not tested;Delayed swallow initiation Pharyngeal - Nectar Pharyngeal - Nectar Teaspoon: Not tested Pharyngeal - Nectar Cup: Delayed swallow initiation Pharyngeal - Thin Pharyngeal - Thin Teaspoon: Delayed swallow initiation Penetration/Aspiration details (thin teaspoon): Material does not enter airway Pharyngeal - Thin Cup: Delayed swallow initiation Penetration/Aspiration details (thin cup): Material enters airway, remains ABOVE vocal cords and not ejected out Pharyngeal - Thin Straw: Delayed swallow initiation Pharyngeal - Solids Pharyngeal - Puree: Delayed swallow initiation Pharyngeal - Regular: Delayed swallow initiation Pharyngeal Phase - Comment Pharyngeal Comment: Pt demonstrated silent penetration X 1 of 5 trials including a straw  Cervical Esophageal Phase  Cervical Esophageal Phase Cervical Esophageal Phase: WFL   G-Codes    Jibran Crookshanks 08-Feb-2013, 2:41 PM

## 2013-01-28 NOTE — Progress Notes (Addendum)
Speech Language Pathology Session Note & Discharge Summary  Patient Details  Name: Rodney Potts MRN: [id number] Date of Birth: 1945/11/13  Today's Date: 01/28/2013 Time: 8469-6295 Time Calculation (min): 45 min  Skilled Therapeutic Intervention: Treatment focus on pt/family education in regards to pt's current swallowing function, diet recommendations and compensatory strategies. Pt's wife verbalized understanding and handouts were given to reinforce information. Pt's wife also reported that the pt is at his cognitive baseline and that she did not think f/u SLP services would be beneficial, however, recommend skilled SLP f/u to maximize swallow safety with current upgraded diet.  Pt will discharge home today with 24 hour supervision.   Patient has met 4 of 4 long term goals.  Patient to discharge at overall Min;Supervision level.   Reasons goals not met: N/A   Clinical Impression/Discharge Summary: Pt has made functional gains and has met 4 of 4 LTG's this admission due to improvements in swallowing function, attention, awareness and problem solving. Pt had repeat MBS today (01/28/13) and recommended regular textures with thin liquids with supervision verbal cues for utilization of small bites/sips and a slow rate. Pt also requires Min A cueing for overall cognitive function in regards to working memory, problem solving, selective attention and awareness. Pt/family education complete. Pt's wife reports pt is at his cognitive baseline, however, would benefit from skilled outpatient SLP services to maximize swallow safety with current upgrade diet.   Care Partner:  Caregiver Able to Provide Assistance: Yes  Type of Caregiver Assistance: Physical;Cognitive  Recommendation:  24 hour supervision/assistance;Outpatient SLP Rationale for SLP Follow Up: Maximize swallowing safety   Equipment: N/A   Reasons for discharge: Discharged from hospital;Treatment goals met   Patient/Family Agrees with  Progress Made and Goals Achieved: Yes   See FIM for current functional status  Valgene Deloatch 01/28/2013, 2:59 PM

## 2013-01-28 NOTE — Discharge Summary (Signed)
NAME:  Rodney Potts, Rodney Potts NO.:  [id number]  MEDICAL RECORD NO.:  [id number]  LOCATION:  [id number]                        FACILITY:  [facility]  PHYSICIAN:  Ranelle Oyster, M.D.DATE OF BIRTH:  02-Jun-1945  DATE OF ADMISSION:  01/20/2013 DATE OF DISCHARGE:                              DISCHARGE SUMMARY   DISCHARGE DIAGNOSES: 1. Rhabdomyolysis, deconditioning with history of brain glioma and     cerebrovascular accident. 2. Sequential compression devices for DVT prophylaxis. 3. Dysphagia. 4. Seizure disorder. 5. Mood with dementia. 6. Overactive bladder. 7. Hyperlipidemia. 8. Gout.  HISTORY OF PRESENT ILLNESS:  This is a 67 year old right-handed male history of glioma of the brain, status post chemotherapy radiation in 1982 as well as seizure disorder and CVA.  The patient ambulated with a cane prior to admission.  Admitted on January 15, 2013, with diffuse generalized weakness and falls.  The patient was found down by his family after extended time.  Cranial CT scan x2 negative.  MRI of the brain on January 16, 2013 showed stable changes of a right posterior, frontal, and anterior temporal encephalomalacia.  Remote small right thalamic and left basal ganglia lacunar infarctions.  Overall, no significant change compared to prior MRI on June 08, 2012.  MRI of cervical spine unremarkable.  No further workup indicated by Neurology Services.  Maintained on a mechanical soft, nectar thick liquid diet. Physical and occupational therapy ongoing.  The patient was admitted for comprehensive rehab program.  PAST MEDICAL HISTORY:  See discharge diagnoses.  SOCIAL HISTORY:  Lives with spouse.  FUNCTIONAL HISTORY:  Prior to admission unemployed, ambulatory. Functional status upon admission to rehab services, +2 total assist for stand, pivot transfers, moderate assist 20 feet with a rolling walker.  PHYSICAL EXAMINATION:  VITAL SIGNS:  Blood pressure 128/80, pulse  68, temperature 99, respirations 18. GENERAL:  This was an alert male, pleasant.  He was oriented to the day. He was able to provide his name, age, and date of birth, followed simple commands. HEENT:  Pupils round and reactive to light. LUNGS:  Clear to auscultation. CARDIAC:  Regular rate and rhythm. ABDOMEN:  Soft, nontender.  Good bowel sounds.  REHABILITATION HOSPITAL COURSE:  The patient was admitted to inpatient rehab services with therapies initiated on a 3-hour daily basis consisting of physical therapy, occupational therapy, speech therapy, and rehabilitation nursing.  The following issues were addressed during the patient's rehabilitation stay.  Pertaining to Rodney Potts's rhabdomyolysis, deconditioning with history of brain glioma, CVA remained stable, he continued to participate fully with his therapies, maintained on Plavix for history of CVA.  Sequential compression devices were in place for DVT prophylaxis.  His diet was slowly advanced as per speech therapy and monitoring for any signs of aspiration.  He did have a history of seizure disorder.  He remained on Keppra as well as phenobarbital as advised.  No seizure activity noted.  He continued on Aricept as prior to hospital admission for noted dementia.  The patient essentially is at his baseline.  Toviaz for history of overactive bladder.  He was voiding without difficulty.  He continued on Lipitor for history of hyperlipidemia.  The patient received weekly collaborative interdisciplinary  team conferences to discuss estimated length of stay, family teaching, and any barriers to discharge. Continue to work with ambulation in a home environment, narrow spaces, controlled environment with a rolling walker, minimal assist for unsafe utilization of assistive devices.  Trial of ambulation with handheld assist with a cane, requiring minimum-to-moderate assist.  Demonstrated to improve balance, posture, and safety.   Recommend the patient utilize a cane as had a rolling walker at that time.  Safety plan was updated and all issues discussed with family.  The patient was able to ambulate to the bathroom using assisted device with close supervision minimal cues to stay with base of support while ambulating.  The patient bathe seated on a shower chair with only supervision.  He demonstrates left lower extremity weakness impaired right lateral leans required to shift weight to perform dressing of socks and shoes.  Full family teaching was completed and plan was to be discharged to home.  DISCHARGE MEDICATIONS:  Included Lipitor 40 mg p.o. daily; Plavix 75 mg p.o. daily; Aricept 5 mg p.o. at bedtime; Toviaz 8 mg p.o. daily; Flonase 2 sprays each nostril daily; Keppra 750 mg p.o. b.i.d.; Claritin 10 mg p.o. daily; phenobarbital-Luminal 97.2 mg p.o. at bedtime.  DIET:  Regular  FOLLOWUP:  The patient will follow up with Dr. Ranelle Oyster, the outpatient rehab service office as needed, Dr. Gloriajean Dell. [facility] on February 09, 2013.     Mariam Dollar, P.A.   ______________________________ Ranelle Oyster, M.D.    DA/MEDQ  D:  01/27/2013  T:  01/28/2013  Job:  [id number]  cc:   Ranelle Oyster, M.D. Gloriajean Dell. Andrey Campanile, M.D.

## 2013-01-28 NOTE — Progress Notes (Signed)
Occupational Therapy Discharge Summary  Patient Details  Name: Rodney Potts MRN: [id number] Date of Birth: 1945-10-22  Today's Date: 01/28/2013  Patient has met 10 of 11 long term goals due to improved activity tolerance, improved balance, ability to compensate for deficits and improved attention. Patient to discharge at overall Supervision level.  Patient's care partner is independent to provide the necessary physical and cognitive assistance at discharge to reduce risk for falls and to assist with problem-solving during ADL and general safety awareness.    Reasons goals not met: awareness unimproved with reinforcement provided  Recommendation:  Patient will benefit from ongoing skilled PT services in outpatient setting to continue to advance functional skills in the area of dynamic standing balance during BADL and functional mobility.  Equipment: No equipment provided  Reasons for discharge: treatment goals met  Patient/family agrees with progress made and goals achieved: Yes  OT Discharge Precautions/Restrictions  Precautions Precautions: Fall Precaution Comments:  h/o falls Restrictions Weight Bearing Restrictions: No Other Position/Activity Restrictions: hx of left knee replacement  Pain Pain Assessment Pain Assessment: No/denies pain  ADL ADL Eating: Supervision/safety Where Assessed-Eating: Chair Grooming: Supervision/safety Where Assessed-Grooming: Sitting at sink Upper Body Bathing: Supervision/safety Where Assessed-Upper Body Bathing: Shower Lower Body Bathing: Supervision/safety Where Assessed-Lower Body Bathing: Shower Upper Body Dressing: Modified independent (Device) Where Assessed-Upper Body Dressing: Edge of bed Lower Body Dressing: Supervision/safety Where Assessed-Lower Body Dressing: Edge of bed Toileting: Supervision/safety Where Assessed-Toileting: Teacher, adult education: Close supervision Toilet Transfer Method: Network engineer: Other (comment) (using cane) Tub/Shower Equipment: Grab bars;Shower seat with back Film/video editor: Close supervision Film/video editor Method: Ambulating (w/cane) ADL Comments: requires supervision for problem solving, instructional cues intermittently (at baseline per spouse)  Vision/Perception  Vision - History Baseline Vision: Wears glasses all the time Patient Visual Report: No change from baseline Vision - Assessment Eye Alignment: Within Functional Limits Ocular Range of Motion: Within Functional Limits Visual Fields: No apparent deficits Perception Perception: Within Functional Limits Praxis Praxis: Impaired Praxis Impairment Details: Initiation   Cognition Overall Cognitive Status: History of cognitive impairments - at baseline Arousal/Alertness: Awake/alert Orientation Level: Oriented X4 Attention: Selective Sustained Attention: Appears intact Selective Attention: Impaired Selective Attention Impairment: Verbal basic;Functional basic Memory: Impaired Memory Impairment: Decreased short term memory;Decreased recall of new information Decreased Short Term Memory: Verbal basic;Functional basic Awareness: Impaired Awareness Impairment: Intellectual impairment Problem Solving: Impaired Problem Solving Impairment: Verbal basic;Functional basic Executive Function: Organizing;Self Monitoring;Self Correcting;Sequencing Sequencing: Impaired Sequencing Impairment: Functional basic Organizing: Impaired Organizing Impairment: Functional basic Self Monitoring: Impaired Self Monitoring Impairment: Functional basic Self Correcting: Impaired Self Correcting Impairment: Functional basic Safety/Judgment: Impaired Comments: Pt does not feel he needs supervision/assist at home however demonstrates decreased dynamic balance with ambulation particularly with distraction and is not safe to be by self.   Sensation Sensation Light Touch: Appears  Intact Stereognosis: Appears Intact Hot/Cold: Appears Intact Proprioception: Appears Intact Additional Comments: Decreased proproprioception of lt. LE during ambulation  Coordination Gross Motor Movements are Fluid and Coordinated: Yes Fine Motor Movements are Fluid and Coordinated: Yes Coordination and Movement Description: bil UE  Motor  Motor Motor: Within Functional Limits Motor - Skilled Clinical Observations: generalized weakness  Mobility  Bed Mobility Bed Mobility: Supine to Sit;Sit to Supine Rolling Right: 5: Supervision Rolling Right Details: Verbal cues for precautions/safety Supine to Sit: 6: Modified independent (Device/Increase time) Sit to Supine: 6: Modified independent (Device/Increase time) Transfers Transfers: Sit to Stand;Stand to Sit Sit to Stand: 5: Supervision Sit to Stand Details:  Verbal cues for precautions/safety Sit to Stand Details (indicate cue type and reason): due to intermittent imbalance Stand to Sit: 5: Supervision Stand to Sit Details (indicate cue type and reason): Verbal cues for precautions/safety Stand to Sit Details: Cues for squaring prior to sitting, safety.   Trunk/Postural Assessment  Cervical Assessment Cervical Assessment: Within Functional Limits Thoracic Assessment Thoracic Assessment: Within Functional Limits Lumbar Assessment Lumbar Assessment: Within Functional Limits Postural Control Postural Control: Deficits on evaluation Righting Reactions: delayed   Balance Balance Balance Assessed: Yes Standardized Balance Assessment Standardized Balance Assessment: Berg Balance Test Berg Balance Test Sit to Stand: Able to stand  independently using hands Standing Unsupported: Able to stand safely 2 minutes Sitting with Back Unsupported but Feet Supported on Floor or Stool: Able to sit safely and securely 2 minutes Stand to Sit: Sits safely with minimal use of hands Transfers: Able to transfer safely, minor use of  hands Standing Unsupported with Eyes Closed: Able to stand 10 seconds with supervision Standing Ubsupported with Feet Together: Able to place feet together independently and stand for 1 minute with supervision From Standing, Reach Forward with Outstretched Arm: Can reach forward >5 cm safely (2") From Standing Position, Pick up Object from Floor: Able to pick up shoe, needs supervision From Standing Position, Turn to Look Behind Over each Shoulder: Looks behind from both sides and weight shifts well Turn 360 Degrees: Needs close supervision or verbal cueing Standing Unsupported, Alternately Place Feet on Step/Stool: Able to complete >2 steps/needs minimal assist Standing Unsupported, One Foot in Front: Able to plae foot ahead of the other independently and hold 30 seconds Standing on One Leg: Tries to lift leg/unable to hold 3 seconds but remains standing independently Total Score: 40 Dynamic Standing Balance Dynamic Standing - Balance Support: During functional activity Dynamic Standing - Level of Assistance: 5: Stand by assistance Dynamic Standing - Comments: lateral and posterior losses of balance particularly with distraction.   Extremity/Trunk Assessment RUE Assessment RUE Assessment: Within Functional Limits LUE Assessment LUE Assessment: Within Functional Limits  See FIM for current functional status  Rodney Potts 01/28/2013, 4:22 PM

## 2013-01-29 NOTE — Patient Care Conference (Signed)
Inpatient RehabilitationTeam Conference and Plan of Care Update Date: 01/27/2013   Time: 2:00 PM    Patient Name: Rodney Potts      Medical Record Number: [id number]  Date of Birth: Jun 28, 1945 Sex: Male         Room/Bed: 4M01C/4M01C-01 Payor Info: Payor: [employer] [place] MEDICARE / Plan: BLUE MEDICARE / Product Type: *No Product type* /    Admitting Diagnosis: old cva deconditioned  Admit Date/Time:  01/20/2013  5:29 PM Admission Comments: No comment available   Primary Diagnosis:  Physical deconditioning Principal Problem: Physical deconditioning  Patient Active Problem List   Diagnosis Date Noted  . Physical deconditioning 01/21/2013  . Difficulty walking 01/15/2013  . Rhabdomyolysis 01/15/2013  . Abnormality of gait 07/02/2012  . Unspecified cerebral artery occlusion with cerebral infarction 07/02/2012  . Aphasia 07/02/2012  . Encounter for long-term (current) use of other medications 07/02/2012  . TIA (transient ischemic attack) 06/08/2012  . Ataxia 06/08/2012  . Slurred speech 06/09/2011  . Weakness generalized 06/09/2011  . CVA (cerebral infarction) 03/24/2011  . Seizures 03/24/2011  . OSA (obstructive sleep apnea) 03/24/2011  . IBS (irritable bowel syndrome) 03/24/2011  . Glioma of brain 03/24/2011    Expected Discharge Date: Expected Discharge Date: 01/28/13  Team Members Present: Physician leading conference: Dr. Claudette Laws Social Worker Present: Amada Jupiter, LCSW Nurse Present: Chana Bode, RN PT Present: Karolee Stamps, PT OT Present: Edwin Cap, Loistine Chance, OT PPS Coordinator present : Tora Duck, RN, CRRN     Current Status/Progress Goal Weekly Team Focus  Medical   poor awareness of deficits, left neglect  establish baseline  therapy to meet with wife   Bowel/Bladder   Continent of bowel and bladder incontinent at times. LBM 01/26/2013. Wears pull-up  To be continent of bowel and bladder.  Monitor any s/s of diarrhea and  constipation.   Swallow/Nutrition/ Hydration   Dys. 3 textures with nectar-thick liquids, Mod-Max A for utilzation of compensatory strategies   Supervision  increased utiliztion of swallowing compensatory strategies, trials of upgraded liquids    ADL's   Supervision for bathing, dressing, transfers, close supervision for funcitonal mobility during ADL using cane.  Verbal cues for safety  Supervision for for ADL and transfes, mod I with dynamic sitting balance during ADL, emergent awareness  improved awareness, endurance, NMR of L-UE during ADL   Mobility             Communication             Safety/Cognition/ Behavioral Observations  Mod A  Min A  awareness, problem solving, working memory    Pain   n/a. PRN Tylenol 650 mg  Pain level 3 or less on a scale of 0-10  Monitor any onset of pain and assess effectiveness of PRN meds.   Skin   Rash to back /Hydrocortisone ointment PRN  No new skin breakdown/infection.  Monitor skin q shift.    Rehab Goals Patient on target to meet rehab goals: Yes Rehab Goals Revised: Pt at supervision level *See Care Plan and progress notes for long and short-term goals.  Barriers to Discharge: will need 24/7 supervision post D/C    Possible Resolutions to Barriers:  wife to take FMLA    Discharge Planning/Teaching Needs:  Pt will need outpatient PT/OT at d/c.  He has all necessary DME.  Pt's wife will come in for family education on 01-28-13 and then pt can d/c after therapies.   Team Discussion:  Pt is currently at supervision level with ADLs.  Pt continues to have decreased awareness with left inattention and thus poor safety awareness.  Pt is not using swallow precautions and is on a D3 with nectar thick liquids.  Pt has supervision goals.  Revisions to Treatment Plan:  Pt is ready for d/c.  Family ed to occur on day of d/c.   Continued Need for Acute Rehabilitation Level of Care: The patient requires daily medical management by a physician with  specialized training in physical medicine and rehabilitation for the following conditions: Daily direction of a multidisciplinary physical rehabilitation program to ensure safe treatment while eliciting the highest outcome that is of practical value to the patient.: Yes Daily medical management of patient stability for increased activity during participation in an intensive rehabilitation regime.: Yes Daily analysis of laboratory values and/or radiology reports with any subsequent need for medication adjustment of medical intervention for : Neurological problems  Guillermina Shaft, [place] 01/30/2013, 12:00 AM [id number]}

## 2013-01-30 NOTE — Progress Notes (Signed)
Social Work Discharge Note  The overall goal for the admission was met for:   Discharge location: Yes - home  Length of Stay: Yes - 8 days  Discharge activity level: Yes - 24/7 Supervision  Home/community participation: Yes  Services provided included: MD, RD, PT, OT, SLP, RN, Pharmacy and SW  Financial Services: Medicare and Private Insurance: Blue Medicare and [employer] Cross Blue Shield  Follow-up services arranged: Outpatient: [facility] for PT/OT/ST   Comments (or additional information):  Patient/Family verbalized understanding of follow-up arrangements: Yes  Individual responsible for coordination of the follow-up plan: pt's wife  Confirmed correct DME delivered: Elvera Lennox 01/30/2013    Emmerson Shuffield, [place]

## 2013-01-30 NOTE — Progress Notes (Signed)
Social Work Patient ID: Rodney Potts, male   DOB: 1945/07/15, 67 y.o.   MRN: [id number]  CSW received d/c date from team on 01-27-13 that pt will be ready for d/c on 01-28-13.  CSW has had multiple phone calls with pt's wife and the plan was to meet in person on 01-27-13, however pt's wife was not at the hospital at the time she planned to be.  CSW called pt's wife to let her know of the plan for pt to d/c 01-28-13.  She was pleased, but had a lot to do to prepare for pt's return, including having FMLA paperwork completed for her school.  CSW informed PA, Jesusita Oka, that wife will need this and he completed for her.  Met with pt to give him an update and he was also pleased with d/c date, so that he will be home for Thanksgiving.  CSW met with pt's wife on the day of d/c and reviewed recommendations of time, including continued therapies (PT/OT) and pt has been to [facility] and they would like to return there.  Just before pt's d/c, SLP decided to add Speech Therapy to pt's outpt therapies.  CSW called these referrals in.  Pt does not need any DME at home.  Pt/wife have family at home for the holiday, so wife will have help.  [place] wife [place], as pt is going to need constant supervision.  Questions answered and support given.  No other needs identified.  Pt/wife are pleased that pt is getting to go home.

## 2013-01-30 NOTE — Progress Notes (Signed)
Social Work Patient ID: GLENN GULLICKSON, male   DOB: 08/16/1945, 66 y.o.   MRN: [id number]   [place]ck Pao Haffey, LCSW Social Worker Signed  Patient Care Conference Service date: 01/29/2013 11:59 PM   Inpatient RehabilitationTeam Conference and Plan of Care Update Date: 01/27/2013   Time: 2:00 PM     Patient Name: Rodney Potts       Medical Record Number: [id number]   Date of Birth: 1945-12-17 Sex: Male         Room/Bed: 4M01C/4M01C-01 Payor Info: Payor: [employer] [place] MEDICARE / Plan: BLUE MEDICARE / Product Type: *No Product type* /   Admitting Diagnosis: old cva deconditioned   Admit Date/Time:  01/20/2013  5:29 PM Admission Comments: No comment available   Primary Diagnosis:  Physical deconditioning Principal Problem: Physical deconditioning    Patient Active Problem List     Diagnosis  Date Noted   .  Physical deconditioning  01/21/2013   .  Difficulty walking  01/15/2013   .  Rhabdomyolysis  01/15/2013   .  Abnormality of gait  07/02/2012   .  Unspecified cerebral artery occlusion with cerebral infarction  07/02/2012   .  Aphasia  07/02/2012   .  Encounter for long-term (current) use of other medications  07/02/2012   .  TIA (transient ischemic attack)  06/08/2012   .  Ataxia  06/08/2012   .  Slurred speech  06/09/2011   .  Weakness generalized  06/09/2011   .  CVA (cerebral infarction)  03/24/2011   .  Seizures  03/24/2011   .  OSA (obstructive sleep apnea)  03/24/2011   .  IBS (irritable bowel syndrome)  03/24/2011   .  Glioma of brain  03/24/2011     Expected Discharge Date: Expected Discharge Date: 01/28/13  Team Members Present: Physician leading conference: Dr. Claudette Laws Social Worker Present: Amada Jupiter, LCSW Nurse Present: Chana Bode, RN PT Present: Karolee Stamps, PT OT Present: Edwin Cap, Loistine Chance, OT PPS Coordinator present : Tora Duck, RN, CRRN        Current Status/Progress  Goal  Weekly Team Focus   Medical      poor awareness of deficits, left neglect  establish baseline  therapy to meet with wife   Bowel/Bladder     Continent of bowel and bladder incontinent at times. LBM 01/26/2013. Wears pull-up  To be continent of bowel and bladder.  Monitor any s/s of diarrhea and constipation.   Swallow/Nutrition/ Hydration     Dys. 3 textures with nectar-thick liquids, Mod-Max A for utilzation of compensatory strategies   Supervision  increased utiliztion of swallowing compensatory strategies, trials of upgraded liquids    ADL's     Supervision for bathing, dressing, transfers, close supervision for funcitonal mobility during ADL using cane.  Verbal cues for safety  Supervision for for ADL and transfes, mod I with dynamic sitting balance during ADL, emergent awareness  improved awareness, endurance, NMR of L-UE during ADL   Mobility            Communication            Safety/Cognition/ Behavioral Observations    Mod A  Min A  awareness, problem solving, working memory    Pain     n/a. PRN Tylenol 650 mg  Pain level 3 or less on a scale of 0-10  Monitor any onset of pain and assess effectiveness of PRN meds.   Skin  Rash to back /Hydrocortisone ointment PRN  No new skin breakdown/infection.  Monitor skin q shift.    Rehab Goals Patient on target to meet rehab goals: Yes Rehab Goals Revised: Pt at supervision level *See Care Plan and progress notes for long and short-term goals.    Barriers to Discharge:  will need 24/7 supervision post D/C      Possible Resolutions to Barriers:    wife to take FMLA      Discharge Planning/Teaching Needs:    Pt will need outpatient PT/OT at d/c.  He has all necessary DME.   Pt's wife will come in for family education on 01-28-13 and then pt can d/c after therapies.    Team Discussion:    Pt is currently at supervision level with ADLs.  Pt continues to have decreased awareness with left inattention and thus poor safety awareness.  Pt is not using swallow  precautions and is on a D3 with nectar thick liquids.  Pt has supervision goals.   Revisions to Treatment Plan:    Pt is ready for d/c.  Family ed to occur on day of d/c.    Continued Need for Acute Rehabilitation Level of Care: The patient requires daily medical management by a physician with specialized training in physical medicine and rehabilitation for the following conditions: Daily direction of a multidisciplinary physical rehabilitation program to ensure safe treatment while eliciting the highest outcome that is of practical value to the patient.: Yes Daily medical management of patient stability for increased activity during participation in an intensive rehabilitation regime.: Yes Daily analysis of laboratory values and/or radiology reports with any subsequent need for medication adjustment of medical intervention for : Neurological problems  Devika Dragovich, [place] 01/30/2013, 12:00 AM [id number]}

## 2013-02-02 ENCOUNTER — Ambulatory Visit: Payer: Medicare Other | Attending: Physical Medicine & Rehabilitation | Admitting: Physical Therapy

## 2013-02-02 ENCOUNTER — Ambulatory Visit: Payer: Medicare Other | Admitting: Occupational Therapy

## 2013-02-02 DIAGNOSIS — Z5189 Encounter for other specified aftercare: Secondary | ICD-10-CM | POA: Insufficient documentation

## 2013-02-02 DIAGNOSIS — R279 Unspecified lack of coordination: Secondary | ICD-10-CM | POA: Insufficient documentation

## 2013-02-02 DIAGNOSIS — M6281 Muscle weakness (generalized): Secondary | ICD-10-CM | POA: Insufficient documentation

## 2013-02-02 DIAGNOSIS — R293 Abnormal posture: Secondary | ICD-10-CM | POA: Insufficient documentation

## 2013-02-02 DIAGNOSIS — R4189 Other symptoms and signs involving cognitive functions and awareness: Secondary | ICD-10-CM | POA: Insufficient documentation

## 2013-02-02 DIAGNOSIS — R269 Unspecified abnormalities of gait and mobility: Secondary | ICD-10-CM | POA: Insufficient documentation

## 2013-02-04 ENCOUNTER — Ambulatory Visit: Payer: Medicare Other | Admitting: Occupational Therapy

## 2013-02-04 ENCOUNTER — Ambulatory Visit: Payer: Medicare Other

## 2013-02-04 ENCOUNTER — Ambulatory Visit: Payer: Medicare Other | Admitting: Physical Therapy

## 2013-02-05 ENCOUNTER — Ambulatory Visit: Payer: Medicare Other | Admitting: Speech Pathology

## 2013-02-09 ENCOUNTER — Ambulatory Visit: Payer: Medicare Other | Admitting: Physical Therapy

## 2013-02-10 ENCOUNTER — Ambulatory Visit: Payer: Medicare Other | Admitting: Physical Therapy

## 2013-02-10 ENCOUNTER — Ambulatory Visit: Payer: Medicare Other | Admitting: Occupational Therapy

## 2013-02-12 ENCOUNTER — Ambulatory Visit: Payer: Medicare Other | Admitting: Occupational Therapy

## 2013-02-17 ENCOUNTER — Ambulatory Visit (INDEPENDENT_AMBULATORY_CARE_PROVIDER_SITE_OTHER): Payer: Medicare Other | Admitting: Neurology

## 2013-02-17 ENCOUNTER — Ambulatory Visit: Payer: Medicare Other | Admitting: Occupational Therapy

## 2013-02-17 ENCOUNTER — Ambulatory Visit: Payer: Medicare Other | Admitting: Physical Therapy

## 2013-02-17 ENCOUNTER — Encounter: Payer: Self-pay | Admitting: Neurology

## 2013-02-17 VITALS — BP 119/75 | HR 81 | Ht 69.5 in | Wt 178.0 lb

## 2013-02-17 DIAGNOSIS — R269 Unspecified abnormalities of gait and mobility: Secondary | ICD-10-CM

## 2013-02-17 MED ORDER — DONEPEZIL HCL 5 MG PO TABS: 10.0000 mg | ORAL_TABLET | Freq: Every day | ORAL | Status: DC

## 2013-02-17 NOTE — Patient Instructions (Signed)
`   Increase Aricept to 10 mg daily if tolerated. Patient had paperwork filled out for [facility]. Continue Keppra 750 twice daily and phenobarbital 97.2 mg at night for seizures. He was counseled about fall prevention and gait instability. Follow up with Levester Fresh, nurse practitioner in 3 months or call earlier if necessary.

## 2013-02-17 NOTE — Progress Notes (Signed)
[facility] [address] [place]. [place] [address] [phone]       OFFICE FOLLOW-UP NOTE  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 History of TIA 09/21/11  Update 02/17/2013 : He returns for followup of her last visit on 01/13/39 and with [facility] nurse practitioner. He continues to have frequent falls and fell twice last week from his chair and hurt his back. He had a major fall in November when he fell down the steps. Fortunately did not have any fractures. He was admitted to the hospital for several days and was found to have rhabdomyolysis and he required rehabilitation for a week. The patient's wife feels that he never had a seizure in the hospital as he had missed 3 doses of Keppra. His Keppra dose was changed to 750 twice daily and phenobarbital was continued at 92.7 mg at night. The patient plans to attend the adult day care program and the wife has brought a form in today for me to fill. He continues to complain of mild short-term memory difficulties but on Mini-Mental status exam today his score 27/30 which is slight improvement from last visit Last visit 01/12/13 : He returns for followup last visit on 09/28/11. He continues to have gait and balance problems and has had several falls particularly last 1 month. He has also had some progressive memory loss and cognitive worsening as per his wife. He was admitted to [facility] earlier this month on 06/07/12 following a fall and MRI scan of the brain showed a left internal capsule small acute infarct  felt to be due to small vessel disease. He was advised to continue Plavix and Lipitor. He states he has been participating in outpatient physical therapy and feels his balance is slightly  improved. He has not had any breakthrough seizures now for several years. He has not tried any medications to help improve his memory. ROS:   14 system review of systems is positive for memory loss, weakness, incontinence of bladder, back pain, decreased concentration, walking difficulty, coordination problems, daytime sleepiness only and all other systems negative. PMH:  Past Medical History  Diagnosis Date  . Brain tumor     glioma; S/P radiation and chemotherapy (01/14/2013)  . TIA (transient ischemic attack) ~ 2011  . IBS (irritable bowel syndrome)   . Shingles   . CVA (cerebral infarction)     R thalamic  . Allergy   . Complication of anesthesia     "after knee OR woke up very wild; pulling things out, etc" (01/14/2013)  . High cholesterol   . Sleep apnea     "corrected w/OR" (01/14/2013)  . Seizures     "hx from the scar tissue where brain tumor was; on RX to control" (01/14/2013)  . Stroke     "he's had a couple; still a little weaker on the left leg, drags that foot" (01/14/2013)  . Prostate cancer     Social History:  History   Social History  . Marital Status: Married    Spouse Name: N/A    Number of Children: 3  . Years of Education: MA   Occupational History  . retired    Social History Main Topics  . Smoking status: Former Smoker -- 0.12 packs/day  for 4 years    Types: Cigarettes  . Smokeless tobacco: Never Used     Comment: 01/14/2013 "quit smoking cigarettes in 1967"  . Alcohol Use: 4.2 oz/week    5 Glasses of wine, 2 Cans of beer per week  . Drug Use: No  . Sexual Activity: Not Currently   Other Topics Concern  . Not on file   Social History Narrative  . No narrative on file    Medications:   Current Outpatient Prescriptions on File Prior to Visit  Medication Sig Dispense Refill  . atorvastatin (LIPITOR) 40 MG tablet Take 1 tablet (40 mg total) by mouth at bedtime.  30 tablet  1  . cetirizine (ZYRTEC) 10 MG tablet Take 10 mg by mouth daily.       . clopidogrel (PLAVIX) 75 MG tablet Take 1 tablet (75 mg total) by mouth at bedtime.  30 tablet  1  . fesoterodine (TOVIAZ) 8 MG TB24 tablet Take 1 tablet (8 mg total) by mouth daily.  30 tablet  1  . fluticasone (FLONASE) 50 MCG/ACT nasal spray Place 2 sprays into both nostrils daily.       Marland Kitchen levETIRAcetam (KEPPRA) 750 MG tablet Take 1 tablet (750 mg total) by mouth every 12 (twelve) hours.  60 tablet  1  . PHENobarbital (LUMINAL) 97.2 MG tablet Take 1 tablet (97.2 mg total) by mouth at bedtime.  90 tablet  3   No current facility-administered medications on file prior to visit.    Allergies:   Allergies  Allergen Reactions  . Amoxicillin Diarrhea  . Dilaudid [Hydromorphone Hcl] Other (See Comments)    DELUSIONS  . Lactose Intolerance (Gi) Diarrhea  . Sulfa Antibiotics Other (See Comments)    Cant control bladder   Filed Vitals:   02/17/13 1506  BP: 119/75  Pulse: 81    Physical Exam General: well developed, well nourished middle-aged Caucasian, seated, in no evident distress Head: head normocephalic and atraumatic. Orohparynx benign Neck: supple with no carotid or supraclavicular bruits Cardiovascular: regular rate and rhythm, no murmurs Musculoskeletal: no deformity Skin:  no rash/petichiae Vascular:  Normal pulses all extremities  Neurologic Exam Mental Status: Awake and fully alert. Oriented to place and time. Recent and remote memory intact. Attention span, concentration and fund of knowledge appropriate. Mood and affect appropriate. Mini-Mental status exam 27/30 with deficits in orientation,   3 step commands. Animal fluency test could 7. Clock drawing 4/4. Geriatric depression scale 6 ( mildy depressed) Cranial Nerves: Fundoscopic exam not done. Pupils equal, briskly reactive to light. Extraocular movements full without nystagmus. Visual fields full to confrontation. Hearing intact. Facial sensation intact. Face, tongue, palate moves normally and symmetrically.  Motor:  Normal bulk and tone. Normal strength in all tested extremity muscles. Diminished fine finger movements on the left. Orbits right over left upper extremity. Tone is slightly increased in both legs left more than Sensory.: intact to touch and pinprick and vibratory sensation.  Coordination: Rapid alternating movements normal in all extremities. Finger-to-nose and heel-to-shin performed accurately bilaterally. Gait and Station: Arises from chair without difficulty. Stance is normal. Gait demonstrates normal stride length but mild imbalance .Not able to heel, toe and tandem walk without difficulty.  Reflexes: 2+ and symmetric. Toes downgoing.     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    PLAN: I had a long discussion with the patient and wife regarding his frequent falls, balance and memory difficulties and answered questions  Increase Aricept to 10 mg daily if tolerated. Patient had paperwork filled out for going to adult day enrichment Center. Continue Keppra 750 twice daily and phenobarbital 97.2 mg at night for seizures. He was counseled about fall prevention and gait instability. Follow up with Levester Fresh, nurse practitioner in 3 months or call earlier if necessary.

## 2013-02-18 ENCOUNTER — Observation Stay (HOSPITAL_COMMUNITY)
Admission: EM | Admit: 2013-02-18 | Discharge: 2013-02-19 | Disposition: A | Discharge status: Home / Self Care | Payer: Medicare Other | Attending: Internal Medicine | Admitting: Internal Medicine

## 2013-02-18 ENCOUNTER — Emergency Department (HOSPITAL_COMMUNITY): Payer: Medicare Other

## 2013-02-18 ENCOUNTER — Encounter (HOSPITAL_COMMUNITY): Payer: Self-pay | Admitting: Emergency Medicine

## 2013-02-18 ENCOUNTER — Observation Stay (HOSPITAL_COMMUNITY): Payer: Medicare Other

## 2013-02-18 DIAGNOSIS — I639 Cerebral infarction, unspecified: Secondary | ICD-10-CM

## 2013-02-18 DIAGNOSIS — R4781 Slurred speech: Secondary | ICD-10-CM

## 2013-02-18 DIAGNOSIS — R269 Unspecified abnormalities of gait and mobility: Secondary | ICD-10-CM

## 2013-02-18 DIAGNOSIS — K589 Irritable bowel syndrome without diarrhea: Secondary | ICD-10-CM

## 2013-02-18 DIAGNOSIS — Z923 Personal history of irradiation: Secondary | ICD-10-CM | POA: Insufficient documentation

## 2013-02-18 DIAGNOSIS — Z9221 Personal history of antineoplastic chemotherapy: Secondary | ICD-10-CM | POA: Insufficient documentation

## 2013-02-18 DIAGNOSIS — M6282 Rhabdomyolysis: Secondary | ICD-10-CM

## 2013-02-18 DIAGNOSIS — R4701 Aphasia: Secondary | ICD-10-CM

## 2013-02-18 DIAGNOSIS — Z8673 Personal history of transient ischemic attack (TIA), and cerebral infarction without residual deficits: Secondary | ICD-10-CM | POA: Insufficient documentation

## 2013-02-18 DIAGNOSIS — R569 Unspecified convulsions: Secondary | ICD-10-CM

## 2013-02-18 DIAGNOSIS — Z87891 Personal history of nicotine dependence: Secondary | ICD-10-CM | POA: Insufficient documentation

## 2013-02-18 DIAGNOSIS — Z8546 Personal history of malignant neoplasm of prostate: Secondary | ICD-10-CM | POA: Insufficient documentation

## 2013-02-18 DIAGNOSIS — Z7902 Long term (current) use of antithrombotics/antiplatelets: Secondary | ICD-10-CM | POA: Insufficient documentation

## 2013-02-18 DIAGNOSIS — R5381 Other malaise: Secondary | ICD-10-CM

## 2013-02-18 DIAGNOSIS — G4733 Obstructive sleep apnea (adult) (pediatric): Secondary | ICD-10-CM

## 2013-02-18 DIAGNOSIS — Z79899 Other long term (current) drug therapy: Secondary | ICD-10-CM

## 2013-02-18 DIAGNOSIS — Z85841 Personal history of malignant neoplasm of brain: Secondary | ICD-10-CM | POA: Insufficient documentation

## 2013-02-18 DIAGNOSIS — R262 Difficulty in walking, not elsewhere classified: Secondary | ICD-10-CM

## 2013-02-18 DIAGNOSIS — Z9181 History of falling: Secondary | ICD-10-CM | POA: Insufficient documentation

## 2013-02-18 DIAGNOSIS — E785 Hyperlipidemia, unspecified: Secondary | ICD-10-CM | POA: Insufficient documentation

## 2013-02-18 DIAGNOSIS — C719 Malignant neoplasm of brain, unspecified: Secondary | ICD-10-CM

## 2013-02-18 DIAGNOSIS — I6350 Cerebral infarction due to unspecified occlusion or stenosis of unspecified cerebral artery: Secondary | ICD-10-CM

## 2013-02-18 DIAGNOSIS — R270 Ataxia, unspecified: Secondary | ICD-10-CM

## 2013-02-18 DIAGNOSIS — G459 Transient cerebral ischemic attack, unspecified: Secondary | ICD-10-CM

## 2013-02-18 DIAGNOSIS — R531 Weakness: Secondary | ICD-10-CM

## 2013-02-18 LAB — DIFFERENTIAL
Basophils Absolute: 0 10*3/uL (ref 0.0–0.1)
Lymphocytes Relative: 18 % (ref 12–46)
Lymphs Abs: 1.3 10*3/uL (ref 0.7–4.0)
Monocytes Absolute: 0.7 10*3/uL (ref 0.1–1.0)
Monocytes Relative: 10 % (ref 3–12)
Neutro Abs: 4.7 10*3/uL (ref 1.7–7.7)

## 2013-02-18 LAB — GLUCOSE, CAPILLARY: Glucose-Capillary: 83 mg/dL (ref 70–99)

## 2013-02-18 LAB — POCT I-STAT, CHEM 8
BUN: 24 mg/dL — ABNORMAL HIGH (ref 6–23)
Creatinine, Ser: 1.2 mg/dL (ref 0.50–1.35)
Glucose, Bld: 85 mg/dL (ref 70–99)
Sodium: 140 mEq/L (ref 135–145)
TCO2: 27 mmol/L (ref 0–100)

## 2013-02-18 LAB — COMPREHENSIVE METABOLIC PANEL
AST: 21 U/L (ref 0–37)
Alkaline Phosphatase: 63 U/L (ref 39–117)
BUN: 21 mg/dL (ref 6–23)
CO2: 28 mEq/L (ref 19–32)
Chloride: 102 mEq/L (ref 96–112)
Creatinine, Ser: 1.06 mg/dL (ref 0.50–1.35)
GFR calc non Af Amer: 71 mL/min — ABNORMAL LOW (ref >90)
Glucose, Bld: 81 mg/dL (ref 70–99)
Potassium: 4 mEq/L (ref 3.5–5.1)
Total Bilirubin: 0.2 mg/dL — ABNORMAL LOW (ref 0.3–1.2)
Total Protein: 7.3 g/dL (ref 6.0–8.3)

## 2013-02-18 LAB — APTT: aPTT: 31 seconds (ref 24–37)

## 2013-02-18 LAB — CBC
HCT: 40.5 % (ref 39.0–52.0)
Hemoglobin: 13.1 g/dL (ref 13.0–17.0)
RBC: 4.41 MIL/uL (ref 4.22–5.81)
RDW: 14.3 % (ref 11.5–15.5)
WBC: 6.9 10*3/uL (ref 4.0–10.5)

## 2013-02-18 LAB — PROTIME-INR: INR: 1.01 (ref 0.00–1.49)

## 2013-02-18 LAB — POCT I-STAT TROPONIN I

## 2013-02-18 MED ORDER — ACETAMINOPHEN 650 MG RE SUPP: 650.0000 mg | Freq: Four times a day (QID) | RECTAL | Status: DC | PRN

## 2013-02-18 MED ORDER — HEPARIN SODIUM (PORCINE) 5000 UNIT/ML IJ SOLN
5000.0000 [IU] | Freq: Three times a day (TID) | INTRAMUSCULAR | Status: DC
  Filled 2013-02-18 (×5): qty 1

## 2013-02-18 MED ORDER — TRIAMCINOLONE ACETONIDE 0.1 % EX CREA
1.0000 "application " | TOPICAL_CREAM | Freq: Every day | CUTANEOUS | Status: DC | PRN
  Filled 2013-02-18: qty 15

## 2013-02-18 MED ORDER — CLOPIDOGREL BISULFATE 75 MG PO TABS
75.0000 mg | ORAL_TABLET | Freq: Every day | ORAL | Status: DC
  Administered 2013-02-18: 75 mg via ORAL
  Filled 2013-02-18 (×2): qty 1

## 2013-02-18 MED ORDER — ONDANSETRON HCL 4 MG PO TABS: 4.0000 mg | ORAL_TABLET | Freq: Four times a day (QID) | ORAL | Status: DC | PRN

## 2013-02-18 MED ORDER — SODIUM CHLORIDE 0.9 % IV SOLN
INTRAVENOUS | Status: AC
  Administered 2013-02-18: 21:00:00 via INTRAVENOUS

## 2013-02-18 MED ORDER — ONDANSETRON HCL 4 MG/2ML IJ SOLN: 4.0000 mg | Freq: Four times a day (QID) | INTRAMUSCULAR | Status: DC | PRN

## 2013-02-18 MED ORDER — DONEPEZIL HCL 10 MG PO TABS
10.0000 mg | ORAL_TABLET | Freq: Every day | ORAL | Status: DC
  Administered 2013-02-18: 10 mg via ORAL
  Filled 2013-02-18 (×2): qty 1

## 2013-02-18 MED ORDER — SODIUM CHLORIDE 0.9 % IJ SOLN
3.0000 mL | Freq: Two times a day (BID) | INTRAMUSCULAR | Status: DC
  Administered 2013-02-18 – 2013-02-19 (×2): 3 mL via INTRAVENOUS

## 2013-02-18 MED ORDER — ACETAMINOPHEN 325 MG PO TABS
650.0000 mg | ORAL_TABLET | Freq: Four times a day (QID) | ORAL | Status: DC | PRN
  Administered 2013-02-19: 650 mg via ORAL
  Filled 2013-02-18: qty 2

## 2013-02-18 MED ORDER — FESOTERODINE FUMARATE ER 8 MG PO TB24
8.0000 mg | ORAL_TABLET | Freq: Every day | ORAL | Status: DC
  Administered 2013-02-19: 8 mg via ORAL
  Filled 2013-02-18: qty 1

## 2013-02-18 MED ORDER — LEVETIRACETAM 500 MG PO TABS
500.0000 mg | ORAL_TABLET | Freq: Every day | ORAL | Status: DC
  Administered 2013-02-19: 500 mg via ORAL
  Filled 2013-02-18: qty 1

## 2013-02-18 MED ORDER — VITAMIN D3 25 MCG (1000 UNIT) PO TABS
1000.0000 [IU] | ORAL_TABLET | Freq: Every day | ORAL | Status: DC
  Administered 2013-02-19: 1000 [IU] via ORAL
  Filled 2013-02-18: qty 1

## 2013-02-18 MED ORDER — FLUTICASONE PROPIONATE 50 MCG/ACT NA SUSP
2.0000 | Freq: Every day | NASAL | Status: DC
  Administered 2013-02-19: 2 via NASAL
  Filled 2013-02-18: qty 16

## 2013-02-18 MED ORDER — ATORVASTATIN CALCIUM 40 MG PO TABS
40.0000 mg | ORAL_TABLET | Freq: Every day | ORAL | Status: DC
  Administered 2013-02-18: 40 mg via ORAL
  Filled 2013-02-18 (×2): qty 1

## 2013-02-18 MED ORDER — LEVETIRACETAM 500 MG PO TABS
500.0000 mg | ORAL_TABLET | Freq: Two times a day (BID) | ORAL | Status: DC
  Administered 2013-02-18: 500 mg via ORAL
  Filled 2013-02-18: qty 1

## 2013-02-18 MED ORDER — LEVETIRACETAM 500 MG PO TABS
1000.0000 mg | ORAL_TABLET | Freq: Every day | ORAL | Status: DC
  Administered 2013-02-18: 1000 mg via ORAL
  Filled 2013-02-18 (×2): qty 2

## 2013-02-18 MED ORDER — PHENOBARBITAL 97.2 MG PO TABS
97.2000 mg | ORAL_TABLET | Freq: Every day | ORAL | Status: DC
  Administered 2013-02-18: 97.2 mg via ORAL
  Filled 2013-02-18: qty 3

## 2013-02-18 MED ORDER — TROSPIUM CHLORIDE ER 60 MG PO CP24: 60.0000 mg | ORAL_CAPSULE | Freq: Every day | ORAL | Status: DC

## 2013-02-18 MED ORDER — LEVETIRACETAM 500 MG PO TABS: 5001000.0000 mg | ORAL_TABLET | Freq: Two times a day (BID) | ORAL | Status: DC

## 2013-02-18 NOTE — Code Documentation (Signed)
66yo male arriving to [facility] via [facility] at 61.  EMS reports the patient was LKW at 1130.  EMS reports that he had right sided weakness, aphasia, and was staring off during exam, and he also stumbled and fell prior to their arrival.  Patient has a history of stroke, seizures, brain tumor, dementia and falls.  CT completed.  NIHSS 3 on arrival, see documentation for details.  No focal weakness on assessment.  Patient has delayed responses with speech and commands.  No acute stroke intervention at this time per Dr. Thad Ranger.  Bedside handoff with ED RN Thayer Ohm.

## 2013-02-18 NOTE — ED Notes (Signed)
Patient transported to MRI 

## 2013-02-18 NOTE — ED Notes (Signed)
Code stroke, Per ems LSN 1130, Pt had right sided deficiets,altered loc, "staring into space", not responding with ems arrival to house. Some right sided facial droop on arrival pt still slow to respond, slight confusion

## 2013-02-18 NOTE — ED Notes (Signed)
Took pts CBG. CBG was 83 reported to nurse

## 2013-02-18 NOTE — ED Notes (Signed)
Admitting at bedside 

## 2013-02-18 NOTE — ED Provider Notes (Signed)
CSN: [id number]     Arrival date & time 02/18/13  1233 History   First MD Initiated Contact with Patient 02/18/13 1249     Chief Complaint  Patient presents with  . Code Stroke   (Consider location/radiation/quality/duration/timing/severity/associated sxs/prior Treatment) HPI Comments: Rodney Potts is a 67 y.o. male who presents for evaluation of decreased responsiveness and difficulty speaking. This occurred after he walked in from outside. He had reportedly fallen outside, but not injured himself. He was attempting to talk on the telephone when his wife noticed that he was not making sense. She could understand words, but they were spoken slowly and in a fashion that was not normal speech. He was attempting to communicate, but could not. He was not having trouble walking at that time. He has returned to baseline, since arrival to the emergency department, according to his wife. He has frequent falls, and is in physical therapy, currently, following a fall several weeks ago. He also fell 2 weeks ago, striking his back on a cabinet, but has not been evaluated for that yet. He has had stroke in the past. There've been no recent medical illnesses. He is reportedly take all of his medications as usual. He was not seen to have a seizure today. There are no other known modifying factors.   The history is provided by the patient.    Past Medical History  Diagnosis Date  . Brain tumor     glioma; S/P radiation and chemotherapy (01/14/2013)  . TIA (transient ischemic attack) ~ 2011  . IBS (irritable bowel syndrome)   . Shingles   . CVA (cerebral infarction)     R thalamic  . Allergy   . Complication of anesthesia     "after knee OR woke up very wild; pulling things out, etc" (01/14/2013)  . High cholesterol   . Sleep apnea     "corrected w/OR" (01/14/2013)  . Seizures     "hx from the scar tissue where brain tumor was; on RX to control" (01/14/2013)  . Stroke     "he's had a couple; still  a little weaker on the left leg, drags that foot" (01/14/2013)  . Prostate cancer    Past Surgical History  Procedure Laterality Date  . Cataract extraction w/ intraocular lens implant Bilateral 2000's    "don't know if they put lens in" (01/14/2013)  . Joint replacement    . Prostatectomy  1990's  . Uvulopalatopharyngoplasty  1980's    "for sleep apnea" (01/14/2013)  . Shoulder arthroscopy Right 2000's  . Total knee arthroplasty Bilateral 2000's   Family History  Problem Relation Age of Onset  . Hypertension Father   . Transient ischemic attack Mother   . Cancer Mother    History  Substance Use Topics  . Smoking status: Former Smoker -- 0.12 packs/day for 4 years    Types: Cigarettes  . Smokeless tobacco: Never Used     Comment: 01/14/2013 "quit smoking cigarettes in 1967"  . Alcohol Use: 4.2 oz/week    5 Glasses of wine, 2 Cans of beer per week    Review of Systems  All other systems reviewed and are negative.    Allergies  Amoxicillin; Dilaudid; Lactose intolerance (gi); and Sulfa antibiotics  Home Medications   Current Outpatient Rx  Name  Route  Sig  Dispense  Refill  . Ascorbic Acid (VITAMIN C PO)   Oral   Take 2 tablets by mouth daily.         Marland Kitchen  atorvastatin (LIPITOR) 40 MG tablet   Oral   Take 1 tablet (40 mg total) by mouth at bedtime.   30 tablet   1   . CALCIUM PO   Oral   Take 1 tablet by mouth daily.         . cetirizine (ZYRTEC) 10 MG tablet   Oral   Take 10 mg by mouth daily.         . cholecalciferol (VITAMIN D) 1000 UNITS tablet   Oral   Take 1,000 Units by mouth daily.         . clopidogrel (PLAVIX) 75 MG tablet   Oral   Take 1 tablet (75 mg total) by mouth at bedtime.   30 tablet   1   . donepezil (ARICEPT) 5 MG tablet   Oral   Take 2 tablets (10 mg total) by mouth at bedtime.   90 tablet   3   . fesoterodine (TOVIAZ) 8 MG TB24 tablet   Oral   Take 1 tablet (8 mg total) by mouth daily.   30 tablet   1   .  fluticasone (FLONASE) 50 MCG/ACT nasal spray   Each Nare   Place 2 sprays into both nostrils daily.          Marland Kitchen levETIRAcetam (KEPPRA) 500 MG tablet   Oral   Take 5,001,000 mg by mouth 2 (two) times daily. Take 500mg  in the morning, and 1000mg  in the evening.         . Omega-3 Fatty Acids (FISH OIL PO)   Oral   Take 1 capsule by mouth daily.         Marland Kitchen PHENobarbital (LUMINAL) 97.2 MG tablet   Oral   Take 1 tablet (97.2 mg total) by mouth at bedtime.   90 tablet   3   . triamcinolone cream (KENALOG) 0.1 %   Topical   Apply 1 application topically daily as needed (rash).          Marland Kitchen VITAMIN E PO   Oral   Take 1 tablet by mouth daily.         Marland Kitchen levETIRAcetam (KEPPRA) 750 MG tablet   Oral   Take 1 tablet (750 mg total) by mouth every 12 (twelve) hours.   60 tablet   1   . Trospium Chloride 60 MG CP24   Oral   Take 60 mg by mouth daily.           BP 132/80  Pulse 76  Temp(Src) 98.5 F (36.9 C)  Resp 18  Wt 183 lb 2 oz (83.065 kg) Physical Exam  Nursing note and vitals reviewed. Constitutional: He is oriented to person, place, and time. He appears well-developed.  Elderly, frail  HENT:  Head: Normocephalic and atraumatic.  Right Ear: External ear normal.  Left Ear: External ear normal.  Eyes: Conjunctivae and EOM are normal. Pupils are equal, round, and reactive to light.  Neck: Normal range of motion and phonation normal. Neck supple.  Cardiovascular: Normal rate, regular rhythm, normal heart sounds and intact distal pulses.   Pulmonary/Chest: Effort normal and breath sounds normal. He exhibits no bony tenderness.  Abdominal: Soft. Normal appearance. There is no tenderness.  Musculoskeletal: Normal range of motion.  Neurological: He is alert and oriented to person, place, and time. No cranial nerve deficit or sensory deficit. He exhibits normal muscle tone. Coordination normal.  He is a poor historian, and defers answers to his wife. There is no truncal  ataxia, while sitting. There is no focal strength abnormality of the arms or legs, bilaterally.  Skin: Skin is warm, dry and intact.  Psychiatric: He has a normal mood and affect. His behavior is normal.    ED Course  Procedures (including critical care time) Medications - No data to display  Patient Vitals for the past 24 hrs:  BP Temp Pulse Resp Weight  02/18/13 1257 132/80 mmHg 98.5 F (36.9 C) 76 18 183 lb 2 oz (83.065 kg)    I discussed the case with the stroke neurologist, who asked me to see the patient and arrange admission. She will not be offering TPA for the code stroke.  2:34 PM-Consult complete with Hospitalist. Patient case explained and discussed. He agrees to admit patient for further evaluation and treatment. Call ended at 1455  Labs Review Labs Reviewed  COMPREHENSIVE METABOLIC PANEL - Abnormal; Notable for the following:    Total Bilirubin 0.2 (*)    GFR calc non Af Amer 71 (*)    GFR calc Af Amer 83 (*)    All other components within normal limits  POCT I-STAT, CHEM 8 - Abnormal; Notable for the following:    BUN 24 (*)    All other components within normal limits  PROTIME-INR  APTT  CBC  DIFFERENTIAL  TROPONIN I  POCT I-STAT TROPONIN I   Imaging Review Ct Head (brain) Wo Contrast  02/18/2013   CLINICAL DATA:  Dysarthria, code stroke  EXAM: CT HEAD WITHOUT CONTRAST  TECHNIQUE: Contiguous axial images were obtained from the base of the skull through the vertex without intravenous contrast.  COMPARISON:  01/15/2013  FINDINGS: A region of chronic right MCA distribution infarction is appreciated with associated stable dystrophic calcifications. Severe areas of low-attenuation project within the subcortical, deep, and periventricular white matter regions. There is diffuse cortical atrophy. Hydrocephalus ex vacuo is appreciated. There is no evidence of mass effect. No intra-axial nor extra-axial fluid collections are identified nor evidence of acute hemorrhage.  There is no evidence of a depressed skull fracture. The visualized paranasal sinuses and mastoid air cells are patent.  IMPRESSION: Chronic and involutional changes without evidence of acute abnormalities. Dr. Dellia Cloud of the Neurology Service was informed of these findings via telephone conversation of the time of the initial interpretation.   Electronically Signed   By: Salome Holmes M.D.   On: 02/18/2013 12:59    EKG Interpretation    Date/Time:  Wednesday February 18 2013 12:53:05 EST Ventricular Rate:  77 PR Interval:  199 QRS Duration: 82 QT Interval:  380 QTC Calculation: 430 R Axis:   1 Text Interpretation:  Sinus rhythm since last tracing no significant change Confirmed by Ariyanna Oien  MD, Xiomara Sevillano ([id number]) on 02/18/2013 2:58:29 PM            MDM   1. Abnormality of gait   2. Aphasia   3. TIA (transient ischemic attack)    Treat symptoms, improving, consistent with TIA. History falls, and stroke. Cannot rule out seizure disorder. Patient needs admission for further observation, evaluation and potentially additional treatment.  Nursing Notes Reviewed/ Care Coordinated, and agree without changes. Applicable Imaging Reviewed.  Interpretation of Laboratory Data incorporated into ED treatment  Plan: Admit    Flint Melter, MD 02/18/13 [phone]

## 2013-02-18 NOTE — Consult Note (Signed)
Referring Physician: Effie Shy    Chief Complaint: Right sided weakness and altered mental status  HPI: Rodney Potts is an 68 y.o. male with a history is stroke, brain tumor and seizures who today was noted to stumble while walking by his wife.  Patient did not fall to the floor.  Afterward patient was noted to have a facial droop, right sided weakness and difficulty responding.  EMS was called but patient was improved by EMS arrival.  Was not felt to be at baseline thought by wife and patient was brought in for further evaluation as a code stoke.  Last acute infarct was about 6 months ago.  Patient has a history of poor balance and has had multiple falls.  Has a history of seizures but from review of the chart has not had any recent breakthrough seizures.    Date last known well: Date: 02/18/2013 Time last known well: Time: 11:30 tPA Given: No: Symptoms improving  Past Medical History  Diagnosis Date  . Brain tumor     glioma; S/P radiation and chemotherapy (01/14/2013)  . TIA (transient ischemic attack) ~ 2011  . IBS (irritable bowel syndrome)   . Shingles   . CVA (cerebral infarction)     R thalamic  . Allergy   . Complication of anesthesia     "after knee OR woke up very wild; pulling things out, etc" (01/14/2013)  . High cholesterol   . Sleep apnea     "corrected w/OR" (01/14/2013)  . Seizures     "hx from the scar tissue where brain tumor was; on RX to control" (01/14/2013)  . Stroke     "he's had a couple; still a little weaker on the left leg, drags that foot" (01/14/2013)  . Prostate cancer     Past Surgical History  Procedure Laterality Date  . Cataract extraction w/ intraocular lens implant Bilateral 2000's    "don't know if they put lens in" (01/14/2013)  . Joint replacement    . Prostatectomy  1990's  . Uvulopalatopharyngoplasty  1980's    "for sleep apnea" (01/14/2013)  . Shoulder arthroscopy Right 2000's  . Total knee arthroplasty Bilateral 2000's    Family  History  Problem Relation Age of Onset  . Hypertension Father   . Transient ischemic attack Mother   . Cancer Mother    Social History:  reports that he has quit smoking. His smoking use included Cigarettes. He has a .48 pack-year smoking history. He has never used smokeless tobacco. He reports that he drinks about 4.2 ounces of alcohol per week. He reports that he does not use illicit drugs.  Allergies:  Allergies  Allergen Reactions  . Amoxicillin Diarrhea  . Dilaudid [Hydromorphone Hcl] Other (See Comments)    DELUSIONS  . Lactose Intolerance (Gi) Diarrhea  . Sulfa Antibiotics Other (See Comments)    Cant control bladder    Medications: I have reviewed the patient's current medications. Prior to Admission:  No current facility-administered medications for this encounter. Current outpatient prescriptions:atorvastatin (LIPITOR) 40 MG tablet, Take 1 tablet (40 mg total) by mouth at bedtime., Disp: 30 tablet, Rfl: 1;  cetirizine (ZYRTEC) 10 MG tablet, Take 10 mg by mouth daily., Disp: , Rfl: ;  clopidogrel (PLAVIX) 75 MG tablet, Take 1 tablet (75 mg total) by mouth at bedtime., Disp: 30 tablet, Rfl: 1;  donepezil (ARICEPT) 5 MG tablet, Take 2 tablets (10 mg total) by mouth at bedtime., Disp: 90 tablet, Rfl: 3 fesoterodine (TOVIAZ) 8 MG TB24  tablet, Take 1 tablet (8 mg total) by mouth daily., Disp: 30 tablet, Rfl: 1;  fluticasone (FLONASE) 50 MCG/ACT nasal spray, Place 2 sprays into both nostrils daily. , Disp: , Rfl: ;  levETIRAcetam (KEPPRA) 750 MG tablet, Take 1 tablet (750 mg total) by mouth every 12 (twelve) hours., Disp: 60 tablet, Rfl: 1 PHENobarbital (LUMINAL) 97.2 MG tablet, Take 1 tablet (97.2 mg total) by mouth at bedtime., Disp: 90 tablet, Rfl: 3;  triamcinolone cream (KENALOG) 0.1 %, , Disp: , Rfl: ;  Trospium Chloride 60 MG CP24, , Disp: , Rfl:   ROS: History obtained from the patient  General ROS: negative for - chills, fatigue, fever, night sweats, weight gain or weight  loss Psychological ROS: negative for - behavioral disorder, hallucinations, memory difficulties, mood swings or suicidal ideation Ophthalmic ROS: negative for - blurry vision, double vision, eye pain or loss of vision ENT ROS: negative for - epistaxis, nasal discharge, oral lesions, sore throat, tinnitus or vertigo Allergy and Immunology ROS: negative for - hives or itchy/watery eyes Hematological and Lymphatic ROS: negative for - bleeding problems, bruising or swollen lymph nodes Endocrine ROS: negative for - galactorrhea, hair pattern changes, polydipsia/polyuria or temperature intolerance Respiratory ROS: negative for - cough, hemoptysis, shortness of breath or wheezing Cardiovascular ROS: negative for - chest pain, dyspnea on exertion, edema or irregular heartbeat Gastrointestinal ROS: negative for - abdominal pain, diarrhea, hematemesis, nausea/vomiting or stool incontinence Genito-Urinary ROS: negative for - dysuria, hematuria, incontinence or urinary frequency/urgency Musculoskeletal ROS: back pain Neurological ROS: as noted in HPI Dermatological ROS: negative for rash and skin lesion changes  Physical Examination: Blood pressure 132/80, pulse 76, temperature 98.5 F (36.9 C), resp. rate 18, weight 83.065 kg (183 lb 2 oz).  Neurologic Examination: Mental Status: Alert.  Able to tell me his age and where he was but unable to give the month.  Speech delayed but fluent.  Able to follow commands without difficulty. Cranial Nerves: II: Discs flat bilaterally; Visual fields grossly normal, pupils equal, round, reactive to light and accommodation III,IV, VI: ptosis not present, extra-ocular motions intact bilaterally V,VII: decrease in the left NLF, facial light touch sensation normal bilaterally VIII: hearing normal bilaterally IX,X: gag reflex present XI: bilateral shoulder shrug XII: midline tongue extension Motor: Right : Upper extremity   5/5    Left:     Upper extremity    5/5  Lower extremity   5/5     Lower extremity   5/5 Tone and bulk:normal tone throughout; no atrophy noted Sensory: Pinprick and light touch intact throughout, bilaterally Deep Tendon Reflexes: 2+ throughout with absent AJ's bilaterally Plantars: Right: downgoing   Left: downgoing Cerebellar: normal finger-to-nose and normal heel-to-shin test Gait: Unable to test CV: pulses palpable throughout     Laboratory Studies:  Basic Metabolic Panel: No results found for this basename: NA, K, CL, CO2, GLUCOSE, BUN, CREATININE, CALCIUM, MG, PHOS,  in the last 168 hours  Liver Function Tests: No results found for this basename: AST, ALT, ALKPHOS, BILITOT, PROT, ALBUMIN,  in the last 168 hours No results found for this basename: LIPASE, AMYLASE,  in the last 168 hours No results found for this basename: AMMONIA,  in the last 168 hours  CBC: No results found for this basename: WBC, NEUTROABS, HGB, HCT, MCV, PLT,  in the last 168 hours  Cardiac Enzymes: No results found for this basename: CKTOTAL, CKMB, CKMBINDEX, TROPONINI,  in the last 168 hours  BNP: No components found with this basename:  POCBNP,   CBG: No results found for this basename: GLUCAP,  in the last 168 hours  Microbiology: Results for orders placed during the hospital encounter of 01/20/13  URINE CULTURE     Status: None   Collection Time    01/23/13  1:57 AM      Result Value Range Status   Specimen Description URINE, CLEAN CATCH   Final   Special Requests NONE   Final   Culture  Setup Time     Final   Value: 01/23/2013 04:03     Performed at [facility] Count     Final   Value: 3,000 COLONIES/ML     Performed at [facility]   Culture     Final   Value: INSIGNIFICANT GROWTH     Performed at [facility]   Report Status 01/24/2013 FINAL   Final    Coagulation Studies: No results found for this basename: LABPROT, INR,  in the last 72 hours  Urinalysis: No results found for  this basename: COLORURINE, APPERANCEUR, LABSPEC, PHURINE, GLUCOSEU, HGBUR, BILIRUBINUR, KETONESUR, PROTEINUR, UROBILINOGEN, NITRITE, LEUKOCYTESUR,  in the last 168 hours  Lipid Panel:    Component Value Date/Time   CHOL 114 01/16/2013 0436   TRIG 49 01/16/2013 0436   HDL 54 01/16/2013 0436   CHOLHDL 2.1 01/16/2013 0436   VLDL 10 01/16/2013 0436   LDLCALC 50 01/16/2013 0436    HgbA1C:  Lab Results  Component Value Date   HGBA1C 5.5 06/08/2012    Urine Drug Screen:      Component Value Date/Time   LABOPIA NONE DETECTED 06/09/2011 2146   COCAINSCRNUR NONE DETECTED 06/09/2011 2146   LABBENZ NONE DETECTED 06/09/2011 2146   AMPHETMU NONE DETECTED 06/09/2011 2146   THCU NONE DETECTED 06/09/2011 2146   LABBARB POSITIVE* 06/09/2011 2146    Alcohol Level: No results found for this basename: ETH,  in the last 168 hours  Other results: EKG: sinus rhythm at 77 bpm.  Imaging: Ct Head (brain) Wo Contrast  02/18/2013   CLINICAL DATA:  Dysarthria, code stroke  EXAM: CT HEAD WITHOUT CONTRAST  TECHNIQUE: Contiguous axial images were obtained from the base of the skull through the vertex without intravenous contrast.  COMPARISON:  01/15/2013  FINDINGS: A region of chronic right MCA distribution infarction is appreciated with associated stable dystrophic calcifications. Severe areas of low-attenuation project within the subcortical, deep, and periventricular white matter regions. There is diffuse cortical atrophy. Hydrocephalus ex vacuo is appreciated. There is no evidence of mass effect. No intra-axial nor extra-axial fluid collections are identified nor evidence of acute hemorrhage. There is no evidence of a depressed skull fracture. The visualized paranasal sinuses and mastoid air cells are patent.  IMPRESSION: Chronic and involutional changes without evidence of acute abnormalities. Dr. Dellia Cloud of the Neurology Service was informed of these findings via telephone conversation of the time of the initial  interpretation.   Electronically Signed   By: Salome Holmes M.D.   On: 02/18/2013 12:59    Assessment: 67 y.o. male presenting after an episode of poor balance accompanied by right sided weakness, facial droop and difficulty with speech.  Patient improved but not felt to be at baseline. Although TIA in the differential,  Can not rule out the possibility of a breakthrough seizure as well.  Stroke work up performed in April of this year and included a carotid doppler and echocardiogram that were unremarkable.    Stroke Risk Factors - hyperlipidemia  Plan: 1. MRI, MRA  of the brain without contrast 2. PT consult, OT consult, Speech consult 3. Prophylactic therapy-Continue Plavix 4. Risk factor modification 5. Telemetry monitoring 6. Frequent neuro checks 7. EEG  Case discussed with Dr. Courtney Heys, MD [facility] [phone] 02/18/2013, 1:10 PM

## 2013-02-18 NOTE — H&P (Signed)
[facility] History and Physical  Rodney Potts ZOX:[id number] DOB: [birth year] DOA: 02/18/2013  Referring physician: Emergency Department PCP: Pamelia Hoit, MD  Specialists:  Chief Complaint: Weakness  HPI: Rodney Potts is a 67 y.o. male  With a hx of prior cva, brain tumor, seizures, hld, who was recently discharged from inpatient rehab on 01/28/13 secondary to generalized weakness and falls. The patient was in his usual state of health until the AM of admission when the patient suddenly was noted by wife to "speak gibberish" with increased R sided weakness. The patient has baseline L sided weakness, of note. He was subsequently brought to the ED where a head ct was unremarkable for acute process. Neurology was consulted with recommendations for medical admission and for stroke work up.  Review of Systems:  Per above, the remainder of the 10pt ros reviewed and are neg  Past Medical History  Diagnosis Date  . Brain tumor     glioma; S/P radiation and chemotherapy (01/14/2013)  . TIA (transient ischemic attack) ~ 2011  . IBS (irritable bowel syndrome)   . Shingles   . CVA (cerebral infarction)     R thalamic  . Allergy   . Complication of anesthesia     "after knee OR woke up very wild; pulling things out, etc" (01/14/2013)  . High cholesterol   . Sleep apnea     "corrected w/OR" (01/14/2013)  . Seizures     "hx from the scar tissue where brain tumor was; on RX to control" (01/14/2013)  . Stroke     "he's had a couple; still a little weaker on the left leg, drags that foot" (01/14/2013)  . Prostate cancer    Past Surgical History  Procedure Laterality Date  . Cataract extraction w/ intraocular lens implant Bilateral 2000's    "don't know if they put lens in" (01/14/2013)  . Joint replacement    . Prostatectomy  1990's  . Uvulopalatopharyngoplasty  1980's    "for sleep apnea" (01/14/2013)  . Shoulder arthroscopy Right 2000's  . Total knee arthroplasty  Bilateral 2000's   Social History:  reports that he has quit smoking. His smoking use included Cigarettes. He has a .48 pack-year smoking history. He has never used smokeless tobacco. He reports that he drinks about 4.2 ounces of alcohol per week. He reports that he does not use illicit drugs.  where does patient live--home, ALF, SNF? and with whom if at home?  Can patient participate in ADLs?  Allergies  Allergen Reactions  . Amoxicillin Diarrhea  . Dilaudid [Hydromorphone Hcl] Other (See Comments)    DELUSIONS  . Lactose Intolerance (Gi) Diarrhea  . Sulfa Antibiotics Other (See Comments)    Cant control bladder    Family History  Problem Relation Age of Onset  . Hypertension Father   . Transient ischemic attack Mother   . Cancer Mother     (be sure to complete)  Prior to Admission medications   Medication Sig Start Date End Date Taking? Authorizing Provider  Ascorbic Acid (VITAMIN C PO) Take 2 tablets by mouth daily.   Yes Historical Provider, MD  atorvastatin (LIPITOR) 40 MG tablet Take 1 tablet (40 mg total) by mouth at bedtime. 01/28/13  Yes Daniel J Angiulli, PA-C  CALCIUM PO Take 1 tablet by mouth daily.   Yes Historical Provider, MD  cetirizine (ZYRTEC) 10 MG tablet Take 10 mg by mouth daily.   Yes Historical Provider, MD  cholecalciferol (VITAMIN D) 1000 UNITS tablet Take  1,000 Units by mouth daily.   Yes Historical Provider, MD  clopidogrel (PLAVIX) 75 MG tablet Take 1 tablet (75 mg total) by mouth at bedtime. 01/28/13  Yes Daniel J Angiulli, PA-C  donepezil (ARICEPT) 5 MG tablet Take 2 tablets (10 mg total) by mouth at bedtime. 02/17/13  Yes [name], MD  fesoterodine (TOVIAZ) 8 MG TB24 tablet Take 1 tablet (8 mg total) by mouth daily. 01/28/13  Yes Daniel J Angiulli, PA-C  fluticasone (FLONASE) 50 MCG/ACT nasal spray Place 2 sprays into both nostrils daily.    Yes Historical Provider, MD  levETIRAcetam (KEPPRA) 500 MG tablet Take 5,001,000 mg by mouth 2 (two) times  daily. Take 500mg  in the morning, and 1000mg  in the evening.   Yes Historical Provider, MD  Omega-3 Fatty Acids (FISH OIL PO) Take 1 capsule by mouth daily.   Yes Historical Provider, MD  PHENobarbital (LUMINAL) 97.2 MG tablet Take 1 tablet (97.2 mg total) by mouth at bedtime. 01/28/13  Yes Daniel J Angiulli, PA-C  triamcinolone cream (KENALOG) 0.1 % Apply 1 application topically daily as needed (rash).  02/09/13  Yes Historical Provider, MD  VITAMIN E PO Take 1 tablet by mouth daily.   Yes Historical Provider, MD  levETIRAcetam (KEPPRA) 750 MG tablet Take 1 tablet (750 mg total) by mouth every 12 (twelve) hours. 01/28/13   Mcarthur Rossetti Angiulli, PA-C  Trospium Chloride 60 MG CP24 Take 60 mg by mouth daily.  02/10/13   Historical Provider, MD   Physical Exam: Filed Vitals:   02/18/13 1257 02/18/13 1521  BP: 132/80 137/87  Pulse: 76 70  Temp: 98.5 F (36.9 C)   Resp: 18 18  Weight: 83.065 kg (183 lb 2 oz)   SpO2:  97%     General:  Awake, in nad  Eyes: PERRL B  ENT: membranes moist, dentition fair  Neck: trachea midline, neck supple  Cardiovascular: regular, s1, s2  Respiratory: normal resp effort, no wheezing  Abdomen: soft, nondistneded  Skin: normal skin turgor, multiple moles over thorax  Musculoskeletal: perfused, no clubbing  Psychiatric: mood/affect normal // no auditory/visual hallucinations  Neurologic: Slight R sided facial droop, generalized weakness, worse on L  Labs on Admission:  Basic Metabolic Panel:  Recent Labs Lab 02/18/13 1307 02/18/13 1318  NA 140 140  K 4.0 4.1  CL 102 104  CO2 28  --   GLUCOSE 81 85  BUN 21 24*  CREATININE 1.06 1.20  CALCIUM 9.6  --    Liver Function Tests:  Recent Labs Lab 02/18/13 1307  AST 21  ALT 17  ALKPHOS 63  BILITOT 0.2*  PROT 7.3  ALBUMIN 3.8   No results found for this basename: LIPASE, AMYLASE,  in the last 168 hours No results found for this basename: AMMONIA,  in the last 168 hours CBC:  Recent  Labs Lab 02/18/13 1307 02/18/13 1318  WBC 6.9  --   NEUTROABS 4.7  --   HGB 13.1 14.6  HCT 40.5 43.0  MCV 91.8  --   PLT 221  --    Cardiac Enzymes:  Recent Labs Lab 02/18/13 1307  TROPONINI <0.30    BNP (last 3 results) No results found for this basename: PROBNP,  in the last 8760 hours CBG: No results found for this basename: GLUCAP,  in the last 168 hours  Radiological Exams on Admission: Ct Head (brain) Wo Contrast  02/18/2013   CLINICAL DATA:  Dysarthria, code stroke  EXAM: CT HEAD WITHOUT CONTRAST  TECHNIQUE: Contiguous axial images were obtained from the base of the skull through the vertex without intravenous contrast.  COMPARISON:  01/15/2013  FINDINGS: A region of chronic right MCA distribution infarction is appreciated with associated stable dystrophic calcifications. Severe areas of low-attenuation project within the subcortical, deep, and periventricular white matter regions. There is diffuse cortical atrophy. Hydrocephalus ex vacuo is appreciated. There is no evidence of mass effect. No intra-axial nor extra-axial fluid collections are identified nor evidence of acute hemorrhage. There is no evidence of a depressed skull fracture. The visualized paranasal sinuses and mastoid air cells are patent.  IMPRESSION: Chronic and involutional changes without evidence of acute abnormalities. Dr. Dellia Cloud of the Neurology Service was informed of these findings via telephone conversation of the time of the initial interpretation.   Electronically Signed   By: Salome Holmes M.D.   On: 02/18/2013 12:59   EKG NSR  Assessment/Plan Principal Problem:   TIA (transient ischemic attack) Active Problems:   CVA (cerebral infarction)   Seizures   Physical deconditioning   1. TIA 1. Neuro recs reviewed 2. Admit to medical floor 3. MRI/MRA brain w/o contrast 4. PT/OT/SLP 5. Neuro checks 6. Pt's symptoms returning to baseline while in ED 2. HX cva 1. Per above 3. Hx  seizures 1. Stable 2. Cont meds 4. Deconditioning 1. PT/OT per above 5. DVT prophylaxis 1. Heparin subq   Code Status: Full (must indicate code status--if unknown or must be presumed, indicate so) Family Communication: Pt and wife in room (indicate person spoken with, if applicable, with phone number if by telephone) Disposition Plan: Pending (indicate anticipated LOS)  Time spent:  CHIU, STEPHEN K [facility] Pager [phone]  If 7PM-7AM, please contact night-coverage [email or web address] Password [facility] 02/18/2013, 3:23 PM

## 2013-02-18 NOTE — ED Notes (Signed)
Dr. Reynolds at bedside.

## 2013-02-19 ENCOUNTER — Ambulatory Visit: Payer: Medicare Other | Admitting: Physical Therapy

## 2013-02-19 ENCOUNTER — Encounter: Payer: Medicare Other | Admitting: Occupational Therapy

## 2013-02-19 ENCOUNTER — Encounter (HOSPITAL_COMMUNITY): Payer: Self-pay

## 2013-02-19 DIAGNOSIS — G459 Transient cerebral ischemic attack, unspecified: Principal | ICD-10-CM

## 2013-02-19 LAB — CBC
Hemoglobin: 11.7 g/dL — ABNORMAL LOW (ref 13.0–17.0)
MCV: 89.7 fL (ref 78.0–100.0)
Platelets: 209 10*3/uL (ref 150–400)
RBC: 3.88 MIL/uL — ABNORMAL LOW (ref 4.22–5.81)
RDW: 14.1 % (ref 11.5–15.5)
WBC: 6.2 10*3/uL (ref 4.0–10.5)

## 2013-02-19 LAB — COMPREHENSIVE METABOLIC PANEL
Albumin: 3.3 g/dL — ABNORMAL LOW (ref 3.5–5.2)
Alkaline Phosphatase: 52 U/L (ref 39–117)
BUN: 17 mg/dL (ref 6–23)
CO2: 27 mEq/L (ref 19–32)
Chloride: 103 mEq/L (ref 96–112)
Creatinine, Ser: 0.91 mg/dL (ref 0.50–1.35)
GFR calc Af Amer: >90 mL/min (ref >90)
GFR calc non Af Amer: 86 mL/min — ABNORMAL LOW (ref >90)
Glucose, Bld: 76 mg/dL (ref 70–99)
Potassium: 3.5 mEq/L (ref 3.5–5.1)
Total Bilirubin: 0.2 mg/dL — ABNORMAL LOW (ref 0.3–1.2)

## 2013-02-19 MED ORDER — ASPIRIN EC 81 MG PO TBEC
81.0000 mg | DELAYED_RELEASE_TABLET | Freq: Every day | ORAL | Status: DC
  Administered 2013-02-19: 81 mg via ORAL
  Filled 2013-02-19: qty 1

## 2013-02-19 MED ORDER — ASPIRIN 81 MG PO TBEC: 81.0000 mg | DELAYED_RELEASE_TABLET | Freq: Every day | ORAL | Status: DC

## 2013-02-19 NOTE — Evaluation (Addendum)
Speech Language Pathology Bedside swallow eval Patient Details Name: Rodney Potts MRN: [id number] DOB: Jan 16, 1946 Today's Date: 02/19/2013 Time: 7829-5621 SLP Time Calculation (min): 25 min  Problem List:  Patient Active Problem List   Diagnosis Date Noted  . Physical deconditioning 01/21/2013  . Difficulty walking 01/15/2013  . Rhabdomyolysis 01/15/2013  . Abnormality of gait 07/02/2012  . Unspecified cerebral artery occlusion with cerebral infarction 07/02/2012  . Aphasia 07/02/2012  . Encounter for long-term (current) use of other medications 07/02/2012  . TIA (transient ischemic attack) 06/08/2012  . Ataxia 06/08/2012  . Slurred speech 06/09/2011  . Weakness generalized 06/09/2011  . CVA (cerebral infarction) 03/24/2011  . Seizures 03/24/2011  . OSA (obstructive sleep apnea) 03/24/2011  . IBS (irritable bowel syndrome) 03/24/2011  . 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 Brain tumor     glioma; S/P radiation and chemotherapy (01/14/2013)  . TIA (transient ischemic attack) ~ 2011  . IBS (irritable bowel syndrome)   . Shingles   . CVA (cerebral infarction)     R thalamic  . Allergy   . Complication of anesthesia     "after knee OR woke up very wild; pulling things out, etc" (01/14/2013)  . High cholesterol   . Sleep apnea     "corrected w/OR" (01/14/2013)  . Seizures     "hx from the scar tissue where brain tumor was; on RX to control" (01/14/2013)  . Stroke     "he's had a couple; still a little weaker on the left leg, drags that foot" (01/14/2013)  . Prostate cancer    Past Surgical History:  Past Surgical History  Procedure Laterality Date  . Cataract extraction w/ intraocular lens implant Bilateral 2000's    "don't know if they put lens in" (01/14/2013)  . Joint replacement    . Prostatectomy  1990's  . Uvulopalatopharyngoplasty  1980's    "for sleep apnea" (01/14/2013)  . Shoulder arthroscopy  Right 2000's  . Total knee arthroplasty Bilateral 2000's   HPI:  Rodney Potts is an 67 y.o. male with a history is stroke, brain tumor and seizures who today was noted to stumble while walking by his wife. Patient did not fall to the floor. Afterward patient was noted to have a facial droop, right sided weakness and difficulty responding. EMS was called but patient was improved by EMS arrival. Was not felt to be at baseline thought by wife and patient was brought in for further evaluation as a code stoke. Last acute infarct was about 6 months ago. Patient has a history of poor balance and has had multiple falls. Has a history of seizures but from review of the chart has not had any recent breakthrough seizures. MRI shows no acute infarcts. Pt has a history of dysphagia, on nectar thick liquids following last CVA, went to rehab. Last MBS recommended Regular thin liquids with a slow pace and full supervision. Mild oral and pharyngeal dysphagia.    Assessment / Plan / Recommendation Clinical Impression  Pt presents with adequate swallow function. Possible mild baseline deficits as seen on prior MBS such as mild oral residual cleared indpendently by pt. Nothing seen that impairs safety with solids and liquids. No SLP f/u needed for swallowing. Will sign off.     SLP Assessment       Follow Up Recommendations       Frequency and Duration  Pertinent Vitals/Pain NA   SLP Goals     SLP Evaluation Prior Functioning      Cognition  Orientation Level: Oriented to person;Oriented to place;Oriented to situation    Comprehension       Expression     Oral / Motor Oral Motor/Sensory Function Overall Oral Motor/Sensory Function: Impaired at baseline Labial ROM: Within Functional Limits Labial Symmetry: Abnormal symmetry left Labial Strength: Within Functional Limits Labial Sensation: Within Functional Limits Lingual ROM: Within Functional Limits Lingual Symmetry: Within Functional  Limits Lingual Strength: Within Functional Limits Lingual Sensation: Within Functional Limits Facial ROM: Within Functional Limits Facial Symmetry: Within Functional Limits Facial Strength: Within Functional Limits Facial Sensation: Within Functional Limits   GO Functional Assessment Tool Used: clinical judgement Functional Limitations: Swallowing Swallow Current Status (N5621): At least 1 percent but less than 20 percent impaired, limited or restricted Swallow Goal Status [phone]): At least 1 percent but less than 20 percent impaired, limited or restricted Swallow Discharge Status [phone]): At least 1 percent but less than 20 percent impaired, limited or restricted  [facility], [place] CCC-SLP [phone]  Claudine Mouton 02/19/2013, 9:02 AM

## 2013-02-19 NOTE — Progress Notes (Signed)
Talked to patient about DCP; patient stated that he is currently going to [facility] for outpatient rehab services and plan to continue getting therapy there after the new year; patient stated that he is going to [place] for the holidays; Alexis Goodell [phone]

## 2013-02-19 NOTE — Evaluation (Signed)
Physical Therapy Evaluation Patient Details Name: Rodney Potts MRN: [id number] DOB: 08/20/1945 Today's Date: 02/19/2013 Time: 0912-0939 PT Time Calculation (min): 27 min  PT Assessment / Plan / Recommendation History of Present Illness  pt presents with new onset of R sided weakness and slurred speech.  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    Clinical Impression  Pt presents generally unsteady, but states he feels back to normal and per wife his ambulation appears close to baseline, except that pt forgot to tell PT that he normally wears certain shoes when he ambulates.  Will continue to follow and will need new script for resumption of OPPT.      PT Assessment  Patient needs continued PT services    Follow Up Recommendations  Outpatient PT;Supervision/Assistance - 24 hour    Does the patient have the potential to tolerate intense rehabilitation      Barriers to Discharge        Equipment Recommendations  None recommended by PT    Recommendations for Other Services     Frequency Min 4X/week    Precautions / Restrictions Precautions Precautions: Fall Precaution Comments:  h/o falls Restrictions Weight Bearing Restrictions: No   Pertinent Vitals/Pain Denied pain.        Mobility  Bed Mobility Bed Mobility: Supine to Sit;Sitting - Scoot to Edge of Bed Supine to Sit: 5: Supervision Sitting - Scoot to Edge of Bed: 5: Supervision Details for Bed Mobility Assistance: pt moves slowly and requires increased time to complete.   Transfers Transfers: Sit to Stand;Stand to Sit Sit to Stand: 4: Min guard;With upper extremity assist;From bed Stand to Sit: 4: Min guard;With upper extremity assist;To chair/3-in-1 Details for Transfer Assistance: Mildly unsteady.   Ambulation/Gait Ambulation/Gait Assistance: 4: Min assist Ambulation Distance (Feet): 120 Feet Assistive device: Straight cane Ambulation/Gait Assistance Details: pt generally unsteady  and with staggered steps.  1 LOB requiring A to prevent fall.   Gait Pattern: Trunk rotated posteriorly on left;Trunk flexed;Shuffle;Decreased stride length Stairs: Yes Stairs Assistance: 4: Min assist Stairs Assistance Details (indicate cue type and reason): cues to slow down.   Stair Management Technique: One rail Left;Forwards;With cane Number of Stairs: 2 Wheelchair Mobility Wheelchair Mobility: No Modified Rankin (Stroke Patients Only) Pre-Morbid Rankin Score: Slight disability Modified Rankin: Moderately severe disability    Exercises     PT Diagnosis: Difficulty walking  PT Problem List: Decreased strength;Decreased activity tolerance;Decreased balance;Decreased mobility;Decreased coordination;Decreased knowledge of use of DME;Decreased cognition;Decreased safety awareness PT Treatment Interventions: DME instruction;Gait training;Stair training;Functional mobility training;Therapeutic activities;Therapeutic exercise;Balance training;Neuromuscular re-education;Patient/family education     PT Goals(Current goals can be found in the care plan section) Acute Rehab PT Goals Patient Stated Goal: Home PT Goal Formulation: With patient Time For Goal Achievement: 03/05/13 Potential to Achieve Goals: Good  Visit Information  Last PT Received On: 02/19/13 Assistance Needed: +1 History of Present Illness: pt presents with new onset of R sided weakness and slurred speech.  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         Prior Functioning  Home Living Family/patient expects to be discharged to:: Private residence Living Arrangements: Spouse/significant other Available Help at Discharge: Family;Available PRN/intermittently Type of Home: House Home Access: Stairs to enter [employer] of Steps: 3-4 Home Layout: One level Additional Comments: pt states he and his wife are moving into a new heom after they come home from a trip at  the holidays.  pt  states one entrance will have rails and the other will not.    Lives With: Spouse Prior Function Level of Independence: Independent with assistive device(s) Comments: uses a SPC or RW Communication Communication: Receptive difficulties;Expressive difficulties Dominant Hand: Right    Cognition  Cognition Arousal/Alertness: Awake/alert Behavior During Therapy: WFL for tasks assessed/performed Overall Cognitive Status: History of cognitive impairments - at baseline    Extremity/Trunk Assessment Upper Extremity Assessment Upper Extremity Assessment: Defer to OT evaluation Lower Extremity Assessment Lower Extremity Assessment: LLE deficits/detail;RLE deficits/detail RLE Deficits / Details: Generally weak, however pt states he feels it is back to normal.   RLE Coordination: decreased fine motor;decreased gross motor LLE Deficits / Details: pt generally weak on L from previous CVA.   LLE Coordination: decreased fine motor;decreased gross motor   Balance Balance Balance Assessed: No  End of Session PT - End of Session Equipment Utilized During Treatment: Gait belt Activity Tolerance: Patient tolerated treatment well Patient left: in chair;with call bell/phone within reach;with family/visitor present Nurse Communication: Mobility status  GP     Sunny Schlein, Alicia [phone] 02/19/2013, 11:06 AM

## 2013-02-19 NOTE — Progress Notes (Signed)
Patient ready for d/c. D/c instructions given to patient and significant other. Educated him on the s/s and the need to follow up when the needed.

## 2013-02-19 NOTE — Evaluation (Signed)
Speech Language Pathology Evaluation Patient Details Name: Rodney Potts MRN: [id number] DOB: 1945-12-23 Today's Date: 02/19/2013 Time: 2952-8413 SLP Time Calculation (min): 25 min  Problem List:  Patient Active Problem List   Diagnosis Date Noted  . Physical deconditioning 01/21/2013  . Difficulty walking 01/15/2013  . Rhabdomyolysis 01/15/2013  . Abnormality of gait 07/02/2012  . Unspecified cerebral artery occlusion with cerebral infarction 07/02/2012  . Aphasia 07/02/2012  . Encounter for long-term (current) use of other medications 07/02/2012  . TIA (transient ischemic attack) 06/08/2012  . Ataxia 06/08/2012  . Slurred speech 06/09/2011  . Weakness generalized 06/09/2011  . CVA (cerebral infarction) 03/24/2011  . Seizures 03/24/2011  . OSA (obstructive sleep apnea) 03/24/2011  . IBS (irritable bowel syndrome) 03/24/2011  . 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 Brain tumor     glioma; S/P radiation and chemotherapy (01/14/2013)  . TIA (transient ischemic attack) ~ 2011  . IBS (irritable bowel syndrome)   . Shingles   . CVA (cerebral infarction)     R thalamic  . Allergy   . Complication of anesthesia     "after knee OR woke up very wild; pulling things out, etc" (01/14/2013)  . High cholesterol   . Sleep apnea     "corrected w/OR" (01/14/2013)  . Seizures     "hx from the scar tissue where brain tumor was; on RX to control" (01/14/2013)  . Stroke     "he's had a couple; still a little weaker on the left leg, drags that foot" (01/14/2013)  . Prostate cancer    Past Surgical History:  Past Surgical History  Procedure Laterality Date  . Cataract extraction w/ intraocular lens implant Bilateral 2000's    "don't know if they put lens in" (01/14/2013)  . Joint replacement    . Prostatectomy  1990's  . Uvulopalatopharyngoplasty  1980's    "for sleep apnea" (01/14/2013)  . Shoulder arthroscopy Right 2000's   . Total knee arthroplasty Bilateral 2000's   HPI:  Rodney Potts is an 67 y.o. male with a history is stroke, brain tumor and seizures who today was noted to stumble while walking by his wife. Patient did not fall to the floor. Afterward patient was noted to have a facial droop, right sided weakness and difficulty responding. EMS was called but patient was improved by EMS arrival. Was not felt to be at baseline thought by wife and patient was brought in for further evaluation as a code stoke. Last acute infarct was about 6 months ago. Patient has a history of poor balance and has had multiple falls. Has a history of seizures but from review of the chart has not had any recent breakthrough seizures. MRI shows no acute infarcts. Pt has a history of dysphagia, on nectar thick liquids following last CVA, went to rehab. Last MBS recommended Regular thin liquids with a slow pace and full supervision. Mild oral and pharyngeal dysphagia.    Assessment / Plan / Recommendation Clinical Impression  Pt demonstrates function consistent with baseline per prior reports from CIR and wife's observation. Pts language function WNL, but pt requires assist for new learning with basic tasks and more complex cognitive tasks, Pt has assist from wife at home. He also has mild speech intelliglibility deficits, sometimes deleting sounds and making imprecise articulatory contacts. Pt at baseline, no SLP f/u needed at this time, will sign off.     SLP Assessment  Patient does not need any further Speech Lanaguage Pathology Services    Follow Up Recommendations       Frequency and Duration        Pertinent Vitals/Pain NA   SLP Goals     SLP Evaluation Prior Functioning  Cognitive/Linguistic Baseline: Baseline deficits Baseline deficit details: prior CVA, brain tumor, cognitive deficits Type of Home: House  Lives With: Spouse Available Help at Discharge: Family;Available PRN/intermittently   Cognition  Overall  Cognitive Status: History of cognitive impairments - at baseline Arousal/Alertness: Awake/alert Orientation Level: Oriented to person;Oriented to place;Oriented to time;Oriented to situation Attention: Focused;Sustained;Selective;Alternating Focused Attention: Appears intact Sustained Attention: Appears intact Selective Attention: Appears intact Alternating Attention: Impaired Alternating Attention Impairment: Verbal complex;Functional complex Memory: Impaired Memory Impairment: Decreased short term memory;Decreased recall of new information Awareness: Impaired Awareness Impairment: Intellectual impairment Problem Solving: Impaired Problem Solving Impairment: Verbal basic;Functional basic Executive Function: Organizing;Self Monitoring;Self Correcting;Sequencing Self Monitoring: Impaired Self Monitoring Impairment: Functional basic;Verbal basic Self Correcting: Impaired Self Correcting Impairment: Functional basic;Verbal complex    Comprehension  Auditory Comprehension Overall Auditory Comprehension: Appears within functional limits for tasks assessed    Expression Verbal Expression Overall Verbal Expression: Appears within functional limits for tasks assessed   Oral / Motor Oral Motor/Sensory Function Overall Oral Motor/Sensory Function: Impaired at baseline Labial ROM: Within Functional Limits Labial Symmetry: Abnormal symmetry left Labial Strength: Within Functional Limits Labial Sensation: Within Functional Limits Lingual ROM: Within Functional Limits Lingual Symmetry: Within Functional Limits Lingual Strength: Within Functional Limits Lingual Sensation: Within Functional Limits Facial ROM: Within Functional Limits Facial Symmetry: Within Functional Limits Facial Strength: Within Functional Limits Facial Sensation: Within Functional Limits Velum: Within Functional Limits Mandible: Within Functional Limits Motor Speech Overall Motor Speech: Impaired Respiration: Within  functional limits Phonation: Normal Resonance: Within functional limits Articulation: Impaired Level of Impairment: Conversation Intelligibility: Intelligibility reduced Word: 75-100% accurate Phrase: 50-74% accurate Sentence: 50-74% accurate Conversation: 50-74% accurate   GO Functional Assessment Tool Used: clinical judgement Functional Limitations: Swallowing Swallow Current Status ([id number]): At least 1 percent but less than 20 percent impaired, limited or restricted Swallow Goal Status [phone]): At least 1 percent but less than 20 percent impaired, limited or restricted Swallow Discharge Status [phone]): At least 1 percent but less than 20 percent impaired, limited or restricted   Kaylla Cobos, Riley Nearing 02/19/2013, 9:13 AM

## 2013-02-19 NOTE — Progress Notes (Addendum)
Subjective: The patient notes no further episodes of impaired speech, poor balance, or right-sided weakness overnight.  MRI yesterday showed no evidence of acute stroke.  The patient states he feels "back to normal" this morning.  Speech therapy is at bedside, patient appears to be swallowing without difficulty.  Objective: Current vital signs: BP 110/74  Pulse 82  Temp(Src) 97.6 F (36.4 C) (Oral)  Resp 18  Ht 5' 9.5" (1.765 m)  Wt 178 lb 8 oz (80.967 kg)  BMI 25.99 kg/m2  SpO2 97% Vital signs in last 24 hours: Temp:  [97.4 F (36.3 C)-98.5 F (36.9 C)] 97.6 F (36.4 C) (12/18 0504) Pulse Rate:  [64-88] 82 (12/18 0504) Resp:  [13-21] 18 (12/18 0504) BP: (110-151)/(70-100) 110/74 mmHg (12/18 0504) SpO2:  [95 %-100 %] 97 % (12/18 0504) Weight:  [178 lb 8 oz (80.967 kg)-183 lb 2 oz (83.065 kg)] 178 lb 8 oz (80.967 kg) (12/17 2115)  Intake/Output from previous day:   Intake/Output this shift:   Nutritional status: General  Neurologic Exam: Mental Status:  A&O x3, speech fluent, no aphasia.  Pt follows commands without difficulty. Cranial Nerves:  II - visual fields intact, PERRL III/IV/VI - EOMI  V/VII - left facial droop VIII - hearing intact IX,X - palate elevates bilaterally, normal speech XI: trapezius strength/neck flexion strength normal bilaterally XII: tongue strength normal  Motor: strength 5/5 throughout.  Normal muscle bulk and tone Sensory: intact Deep Tendon Reflexes: 2+ bilaterally Cerebellar: Normal finger-to-nose testing.  Carotid auscultation: No bruit  Lab Results: Results for orders placed during the hospital encounter of 02/18/13 (from the past 48 hour(s))  PROTIME-INR     Status: None   Collection Time    02/18/13  1:07 PM      Result Value Range   Prothrombin Time 13.1  11.6 - 15.2 seconds   INR 1.01  0.00 - 1.49  APTT     Status: None   Collection Time    02/18/13  1:07 PM      Result Value Range   aPTT 31  24 - 37 seconds  CBC      Status: None   Collection Time    02/18/13  1:07 PM      Result Value Range   WBC 6.9  4.0 - 10.5 K/uL   RBC 4.41  4.22 - 5.81 MIL/uL   Hemoglobin 13.1  13.0 - 17.0 g/dL   HCT 16.1  09.6 - 04.5 %   MCV 91.8  78.0 - 100.0 fL   MCH 29.7  26.0 - 34.0 pg   MCHC 32.3  30.0 - 36.0 g/dL   RDW 40.9  81.1 - 91.4 %   Platelets 221  150 - 400 K/uL  DIFFERENTIAL     Status: None   Collection Time    02/18/13  1:07 PM      Result Value Range   Neutrophils Relative % 68  43 - 77 %   Neutro Abs 4.7  1.7 - 7.7 K/uL   Lymphocytes Relative 18  12 - 46 %   Lymphs Abs 1.3  0.7 - 4.0 K/uL   Monocytes Relative 10  3 - 12 %   Monocytes Absolute 0.7  0.1 - 1.0 K/uL   Eosinophils Relative 3  0 - 5 %   Eosinophils Absolute 0.2  0.0 - 0.7 K/uL   Basophils Relative 0  0 - 1 %   Basophils Absolute 0.0  0.0 - 0.1 K/uL  COMPREHENSIVE METABOLIC PANEL  Status: Abnormal   Collection Time    02/18/13  1:07 PM      Result Value Range   Sodium 140  135 - 145 mEq/L   Potassium 4.0  3.5 - 5.1 mEq/L   Chloride 102  96 - 112 mEq/L   CO2 28  19 - 32 mEq/L   Glucose, Bld 81  70 - 99 mg/dL   BUN 21  6 - 23 mg/dL   Creatinine, Ser 1.61  0.50 - 1.35 mg/dL   Calcium 9.6  8.4 - 09.6 mg/dL   Total Protein 7.3  6.0 - 8.3 g/dL   Albumin 3.8  3.5 - 5.2 g/dL   AST 21  0 - 37 U/L   ALT 17  0 - 53 U/L   Alkaline Phosphatase 63  39 - 117 U/L   Total Bilirubin 0.2 (*) 0.3 - 1.2 mg/dL   GFR calc non Af Amer 71 (*) >90 mL/min   GFR calc Af Amer 83 (*) >90 mL/min   Comment: (NOTE)     The eGFR has been calculated using the CKD EPI equation.     This calculation has not been validated in all clinical situations.     eGFR's persistently <90 mL/min signify possible Chronic Kidney     Disease.  TROPONIN I     Status: None   Collection Time    02/18/13  1:07 PM      Result Value Range   Troponin I <0.30  <0.30 ng/mL   Comment:            Due to the release kinetics of cTnI,     a negative result within the first hours      of the onset of symptoms does not rule out     myocardial infarction with certainty.     If myocardial infarction is still suspected,     repeat the test at appropriate intervals.  POCT I-STAT TROPONIN I     Status: None   Collection Time    02/18/13  1:15 PM      Result Value Range   Troponin i, poc 0.00  0.00 - 0.08 ng/mL   Comment 3            Comment: Due to the release kinetics of cTnI,     a negative result within the first hours     of the onset of symptoms does not rule out     myocardial infarction with certainty.     If myocardial infarction is still suspected,     repeat the test at appropriate intervals.  POCT I-STAT, CHEM 8     Status: Abnormal   Collection Time    02/18/13  1:18 PM      Result Value Range   Sodium 140  135 - 145 mEq/L   Potassium 4.1  3.5 - 5.1 mEq/L   Chloride 104  96 - 112 mEq/L   BUN 24 (*) 6 - 23 mg/dL   Creatinine, Ser 0.45  0.50 - 1.35 mg/dL   Glucose, Bld 85  70 - 99 mg/dL   Calcium, Ion 4.09  8.11 - 1.30 mmol/L   TCO2 27  0 - 100 mmol/L   Hemoglobin 14.6  13.0 - 17.0 g/dL   HCT 91.4  78.2 - 95.6 %  GLUCOSE, CAPILLARY     Status: None   Collection Time    02/18/13  3:26 PM      Result Value Range  Glucose-Capillary 83  70 - 99 mg/dL  COMPREHENSIVE METABOLIC PANEL     Status: Abnormal   Collection Time    02/19/13  6:00 AM      Result Value Range   Sodium 139  135 - 145 mEq/L   Potassium 3.5  3.5 - 5.1 mEq/L   Chloride 103  96 - 112 mEq/L   CO2 27  19 - 32 mEq/L   Glucose, Bld 76  70 - 99 mg/dL   BUN 17  6 - 23 mg/dL   Creatinine, Ser 6.29  0.50 - 1.35 mg/dL   Calcium 8.8  8.4 - 52.8 mg/dL   Total Protein 6.2  6.0 - 8.3 g/dL   Albumin 3.3 (*) 3.5 - 5.2 g/dL   AST 17  0 - 37 U/L   ALT 13  0 - 53 U/L   Alkaline Phosphatase 52  39 - 117 U/L   Total Bilirubin 0.2 (*) 0.3 - 1.2 mg/dL   GFR calc non Af Amer 86 (*) >90 mL/min   GFR calc Af Amer >90  >90 mL/min   Comment: (NOTE)     The eGFR has been calculated using the CKD  EPI equation.     This calculation has not been validated in all clinical situations.     eGFR's persistently <90 mL/min signify possible Chronic Kidney     Disease.  CBC     Status: Abnormal   Collection Time    02/19/13  6:00 AM      Result Value Range   WBC 6.2  4.0 - 10.5 K/uL   RBC 3.88 (*) 4.22 - 5.81 MIL/uL   Hemoglobin 11.7 (*) 13.0 - 17.0 g/dL   Comment: DELTA CHECK NOTED     REPEATED TO VERIFY   HCT 34.8 (*) 39.0 - 52.0 %   MCV 89.7  78.0 - 100.0 fL   MCH 30.2  26.0 - 34.0 pg   MCHC 33.6  30.0 - 36.0 g/dL   RDW 41.3  24.4 - 01.0 %   Platelets 209  150 - 400 K/uL    No results found for this or any previous visit (from the past 240 hour(s)).  Lipid Panel No results found for this basename: CHOL, TRIG, HDL, CHOLHDL, VLDL, LDLCALC,  in the last 72 hours  Studies/Results: Ct Head (brain) Wo Contrast  02/18/2013   CLINICAL DATA:  Dysarthria, code stroke  EXAM: CT HEAD WITHOUT CONTRAST  TECHNIQUE: Contiguous axial images were obtained from the base of the skull through the vertex without intravenous contrast.  COMPARISON:  01/15/2013  FINDINGS: A region of chronic right MCA distribution infarction is appreciated with associated stable dystrophic calcifications. Severe areas of low-attenuation project within the subcortical, deep, and periventricular white matter regions. There is diffuse cortical atrophy. Hydrocephalus ex vacuo is appreciated. There is no evidence of mass effect. No intra-axial nor extra-axial fluid collections are identified nor evidence of acute hemorrhage. There is no evidence of a depressed skull fracture. The visualized paranasal sinuses and mastoid air cells are patent.  IMPRESSION: Chronic and involutional changes without evidence of acute abnormalities. Dr. Dellia Cloud of the Neurology Service was informed of these findings via telephone conversation of the time of the initial interpretation.   Electronically Signed   By: Salome Holmes M.D.   On: 02/18/2013  12:59   Mr Brain Wo Contrast  02/18/2013   CLINICAL DATA:  67 year old male with right side weakness, aphasia. Code stroke. Initial encounter. Remote history of brain  tumor or (1982). Prostate cancer.  EXAM: MRI HEAD WITHOUT CONTRAST  TECHNIQUE: Multiplanar, multiecho pulse sequences of the brain and surrounding structures were obtained without intravenous contrast.  COMPARISON:  Head CT 02/18/2013 at 1237 hrs. Brain MRI 01/12/2013 and earlier.  FINDINGS: No restricted diffusion to suggest acute infarction. No midline shift, mass effect, evidence of mass lesion, ventriculomegaly, extra-axial collection or acute intracranial hemorrhage. Cervicomedullary junction and pituitary are within normal limits.  Stable cerebral volume. Major intracranial vascular flow voids are stable. Chronic confluent and widespread cerebral white matter T2 and FLAIR hyperintensity. Superimposed chronic lacunar infarcts in the bilateral deep gray matter nuclei. Numerous chronic micro hemorrhages scattered throughout the cerebral hemispheres. Mild involvement of the cerebellum, with otherwise relatively normal appearance of the brainstem and cerebellum.  Visible internal auditory structures appear normal. Stable orbits soft tissues. Stable paranasal sinuses and mastoids, largely clear. Stable bone marrow signal, with some decreased T1 marrow signal in the lower C2 vertebra and C3 vertebral body. Visualized scalp soft tissues are within normal limits.  IMPRESSION: 1.  No acute intracranial abnormality. 2. Stable chronic changes including evidence of advanced chronic small vessel disease.   Electronically Signed   By: Augusto Gamble M.D.   On: 02/18/2013 18:18    Medications: I have reviewed the patient's current medications. Scheduled Meds: . sodium chloride   Intravenous STAT  . atorvastatin  40 mg Oral QHS  . cholecalciferol  1,000 Units Oral Daily  . clopidogrel  75 mg Oral QHS  . donepezil  10 mg Oral QHS  . fesoterodine  8 mg  Oral Daily  . fluticasone  2 spray Each Nare Daily  . heparin  5,000 Units Subcutaneous Q8H  . levETIRAcetam  1,000 mg Oral QHS  . levETIRAcetam  500 mg Oral Daily  . PHENobarbital  97.2 mg Oral QHS  . sodium chloride  3 mL Intravenous Q12H   Continuous Infusions:  PRN Meds:.acetaminophen, acetaminophen, ondansetron (ZOFRAN) IV, ondansetron, triamcinolone cream  Assessment/Plan: The patient is a 67 yo man, history of glioma s/p chemo/rad, prior CVA, HL, seizure disorder, presenting with a short episode of poor balance, right-sided weakness, facial droop, and speech difficulties, which resolved spontaneously, most likely representing TIA.  MRI negative for acute stroke.  With symptoms being right sided and tumor right sided, it is unlikely that this represents seizure.    Recommendations: 1.  start aspirin 81 mg, to continue dual antiplatelet for 3 weeks, then stop aspirin and keep only plavix 2.  continue atorvastatin 3.  follow-up with Dr. Pearlean Brownie in 3-4 weeks 4.  No further neurologic intervention is recommended at this time.  If further questions arise, please call or page at that time.  Thank you for allowing neurology to participate in the care of this patient.    LOS: 1 day   Janalyn Harder, PGY3 Pgr. [phone]  02/19/2013  8:38 AM  Patient seen and examined with above resident.  MRI reviewed and shows no evidence of acute infarct.  Clinical course and management discussed.  Necessary edits performed.  I agree with the above.    Thana Farr, MD [facility] [phone]  02/19/2013  11:48 AM

## 2013-02-19 NOTE — Discharge Summary (Signed)
Physician Discharge Summary  Rodney Potts Amy ZOX:[id number] DOB: Apr 20, 1945 DOA: 02/18/2013  PCP: Pamelia Hoit, MD  Admit date: 02/18/2013 Discharge date: 02/19/2013  Time spent: 35 minutes  Recommendations for Outpatient Follow-up:  start aspirin 81 mg, to continue dual antiplatelet for 3 weeks, then stop aspirin and keep only plavix   Discharge Diagnoses:  Principal Problem:   TIA (transient ischemic attack) Active Problems:   CVA (cerebral infarction)   Seizures   Physical deconditioning   Discharge Condition: improved  Diet recommendation: cardiac  Filed Weights   02/18/13 1257 02/18/13 1856 02/18/13 2115  Weight: 83.065 kg (183 lb 2 oz) 80.967 kg (178 lb 8 oz) 80.967 kg (178 lb 8 oz)    History of present illness:  Rodney Potts is a 67 y.o. male  With a hx of prior cva, brain tumor, seizures, hld, who was recently discharged from inpatient rehab on 01/28/13 secondary to generalized weakness and falls. The patient was in his usual state of health until the AM of admission when the patient suddenly was noted by wife to "speak gibberish" with increased R sided weakness. The patient has baseline L sided weakness, of note. He was subsequently brought to the ED where a head ct was unremarkable for acute process. Neurology was consulted with recommendations for medical admission and for stroke work up.   Hospital Course:  The patient is a 67 yo man, history of glioma s/p chemo/rad, prior CVA, HL, seizure disorder, presenting with a short episode of poor balance, right-sided weakness, facial droop, and speech difficulties, which resolved spontaneously, most likely representing TIA. MRI negative for acute stroke.  -will cancel EEG since seizure less likely now, can do as outpatient  -continue plavix  -start aspirin 81 mg, to continue dual antiplatelet for 3 weeks, then stop aspirin and keep only plavix  -continue atorvastatin  -follow-up with Dr. Pearlean Brownie in 3-4 weeks  No  driving      Procedures:  MRI- negative  Consultations:  neuro  Discharge Exam: Filed Vitals:   02/19/13 0950  BP: 108/64  Pulse: 90  Temp: 97.9 F (36.6 C)  Resp: 18    General: A+Ox3, NAD Cardiovascular: rrr Respiratory: clear anterior  Discharge Instructions  Discharge Orders   Future Appointments Provider Department Dept Phone   03/09/2013 2:45 PM Marlis Edelson, OT [facility] [phone]   03/09/2013 3:30 PM Clarita Crane, PT [facility] [phone]   03/11/2013 2:45 PM Colonel Bald, OT [facility] [phone]   03/11/2013 3:30 PM Newell Coral, [place] [facility] [phone]   03/16/2013 2:45 PM Marlis Edelson, OT [facility] [phone]   03/16/2013 3:30 PM Kerry Fort, PT [facility] [phone]   03/18/2013 2:45 PM Colonel Bald, OT [facility] [phone]   03/18/2013 3:30 PM Newell Coral, [place] [facility] [phone]   03/23/2013 2:45 PM Marlis Edelson, OT [facility] [phone]   03/23/2013 3:30 PM Kerry Fort, PT [facility] [phone]   03/25/2013 2:45 PM Colonel Bald, OT [facility] [phone]   03/25/2013 3:30 PM Newell Coral, [place] [facility] [phone]   03/30/2013 2:45 PM Marlis Edelson, OT [facility] [phone]   03/30/2013 3:30 PM Kerry Fort, PT [facility] [phone]   04/01/2013 2:45 PM Colonel Bald,  OT [facility] [phone]  04/01/2013 3:30 PM Newell Coral, PTA [facility] [phone]   05/21/2013 3:00 PM Ronal Fear, NP [facility] [phone]   Future Orders Complete By Expires   Diet - low sodium heart healthy  As directed    Discharge instructions  As directed    Comments:     No driving Resume home dose keppra start aspirin 81 mg, to continue dual antiplatelet for 3 weeks, then stop aspirin and keep only plavix   Increase activity slowly  As directed        Medication List         aspirin 81 MG EC tablet  Take 1 tablet (81 mg total) by mouth daily.     atorvastatin 40 MG tablet  Commonly known as:  LIPITOR  Take 1 tablet (40 mg total) by mouth at bedtime.     CALCIUM PO  Take 1 tablet by mouth daily.     cetirizine 10 MG tablet  Commonly known as:  ZYRTEC  Take 10 mg by mouth daily.     cholecalciferol 1000 UNITS tablet  Commonly known as:  VITAMIN D  Take 1,000 Units by mouth daily.     clopidogrel 75 MG tablet  Commonly known as:  PLAVIX  Take 1 tablet (75 mg total) by mouth at bedtime.     donepezil 5 MG tablet  Commonly known as:  ARICEPT  Take 2 tablets (10 mg total) by mouth at bedtime.     fesoterodine 8 MG Tb24 tablet  Commonly known as:  TOVIAZ  Take 1 tablet (8 mg total) by mouth daily.     FISH OIL PO  Take 1 capsule by mouth daily.     fluticasone 50 MCG/ACT nasal spray  Commonly known as:  FLONASE  Place 2 sprays into both nostrils daily.     levETIRAcetam 500 MG tablet  Commonly known as:  KEPPRA  Take 500-1,000 mg by mouth 2 (two) times daily. Takes 500 mg in the am and 1000 mg at bedtime     levETIRAcetam 750 MG tablet  Commonly known as:  KEPPRA  Take 1 tablet (750 mg total) by mouth every 12 (twelve) hours.     PHENobarbital 97.2 MG tablet  Commonly known as:  LUMINAL  Take 1 tablet (97.2 mg total) by mouth at  bedtime.     triamcinolone cream 0.1 %  Commonly known as:  KENALOG  Apply 1 application topically daily as needed (rash).     Trospium Chloride 60 MG Cp24  Take 60 mg by mouth daily.     VITAMIN C PO  Take 2 tablets by mouth daily.     VITAMIN E PO  Take 1 tablet by mouth daily.       Allergies  Allergen Reactions  . Amoxicillin Diarrhea  . Dilaudid [Hydromorphone Hcl] Other (See Comments)    DELUSIONS  . Lactose Intolerance (Gi) Diarrhea  . Sulfa Antibiotics Other (See Comments)    Cant control bladder      The results of significant diagnostics from this hospitalization (including imaging, microbiology, ancillary and laboratory) are listed below for reference.    Significant Diagnostic Studies: Ct Head (brain) Wo Contrast  02/18/2013   CLINICAL DATA:  Dysarthria, code stroke  EXAM: CT HEAD WITHOUT CONTRAST  TECHNIQUE: Contiguous axial images were obtained from the base of the skull through the vertex without intravenous contrast.  COMPARISON:  01/15/2013  FINDINGS: A region of chronic right MCA distribution infarction  is appreciated with associated stable dystrophic calcifications. Severe areas of low-attenuation project within the subcortical, deep, and periventricular white matter regions. There is diffuse cortical atrophy. Hydrocephalus ex vacuo is appreciated. There is no evidence of mass effect. No intra-axial nor extra-axial fluid collections are identified nor evidence of acute hemorrhage. There is no evidence of a depressed skull fracture. The visualized paranasal sinuses and mastoid air cells are patent.  IMPRESSION: Chronic and involutional changes without evidence of acute abnormalities. Dr. Dellia Cloud of the Neurology Service was informed of these findings via telephone conversation of the time of the initial interpretation.   Electronically Signed   By: Salome Holmes M.D.   On: 02/18/2013 12:59   Mr Brain Wo Contrast  02/18/2013   CLINICAL DATA:  67 year old male  with right side weakness, aphasia. Code stroke. Initial encounter. Remote history of brain tumor or (1982). Prostate cancer.  EXAM: MRI HEAD WITHOUT CONTRAST  TECHNIQUE: Multiplanar, multiecho pulse sequences of the brain and surrounding structures were obtained without intravenous contrast.  COMPARISON:  Head CT 02/18/2013 at 1237 hrs. Brain MRI 01/12/2013 and earlier.  FINDINGS: No restricted diffusion to suggest acute infarction. No midline shift, mass effect, evidence of mass lesion, ventriculomegaly, extra-axial collection or acute intracranial hemorrhage. Cervicomedullary junction and pituitary are within normal limits.  Stable cerebral volume. Major intracranial vascular flow voids are stable. Chronic confluent and widespread cerebral white matter T2 and FLAIR hyperintensity. Superimposed chronic lacunar infarcts in the bilateral deep gray matter nuclei. Numerous chronic micro hemorrhages scattered throughout the cerebral hemispheres. Mild involvement of the cerebellum, with otherwise relatively normal appearance of the brainstem and cerebellum.  Visible internal auditory structures appear normal. Stable orbits soft tissues. Stable paranasal sinuses and mastoids, largely clear. Stable bone marrow signal, with some decreased T1 marrow signal in the lower C2 vertebra and C3 vertebral body. Visualized scalp soft tissues are within normal limits.  IMPRESSION: 1.  No acute intracranial abnormality. 2. Stable chronic changes including evidence of advanced chronic small vessel disease.   Electronically Signed   By: Augusto Gamble M.D.   On: 02/18/2013 18:18   Dg Swallowing Func-speech Pathology  01/28/2013   Huston Foley, CCC-SLP     01/28/2013  3:02 PM Objective Swallowing Evaluation: Modified Barium Swallowing Study   Patient Details  Name: DOMIQUE CLAPPER MRN: [id number] Date of Birth: Jul 09, 1945  Today's Date: 01/28/2013 Time: 0900-0915 Time Calculation (min): 15 min  Past Medical History:  Past Medical History   Diagnosis Date  . Brain tumor     glioma; S/P radiation and chemotherapy (01/14/2013)  . TIA (transient ischemic attack) ~ 2011  . IBS (irritable bowel syndrome)   . Shingles   . CVA (cerebral infarction)     R thalamic  . Allergy   . Complication of anesthesia     "after knee OR woke up very wild; pulling things out, etc"  (01/14/2013)  . High cholesterol   . Sleep apnea     "corrected w/OR" (01/14/2013)  . Seizures     "hx from the scar tissue where brain tumor was; on RX to  control" (01/14/2013)  . Stroke     "he's had a couple; still a little weaker on the left leg,  drags that foot" (01/14/2013)  . Prostate cancer    Past Surgical History:  Past Surgical History  Procedure Laterality Date  . Cataract extraction w/ intraocular lens implant Bilateral  2000's    "don't know if they put lens in" (01/14/2013)  .  Joint replacement    . Prostatectomy  1990's  . Uvulopalatopharyngoplasty  1980's    "for sleep apnea" (01/14/2013)  . Shoulder arthroscopy Right 2000's  . Total knee arthroplasty Bilateral 2000's   HPI:  Pt is a 67 y.o. male with a history of brain tumor status post  radiation in 1982 who has been having difficulty with falls over  the past year. He had a stroke in April of this year, and had  difficulty following that but had improved to the point that he  was able to walk without a cane. Over the past couple weeks,  however, he has had worsening of his gait and then he had a fall  down some stairs and was unable to get up from the bottom. On the  morning of 01/15/13, pt was found to have altered mentation and  per RN has seemed to have difficulty managing his secretions  since that time.  Pt had MBS on 11/14 and was recommended Dys. 3  textures with nectar-thick liquids. Pt transferred to CIR  01/20/13 and has been participating in dysphagia treatment.  Repeat MBS today to assess for possible upgrade.       Recommendation/Prognosis  Clinical Impression Dysphagia Diagnosis: Mild oral phase dysphagia;Mild  pharyngeal  phase dysphagia Clinical impression: Pt. demonstrated an improvement in  swallowing function and presents with a mild oral and pharyngeal  dysphagia. Oral deficits are characterized by prolonged  mastication with delayed oral transit with regular textures,  however, pt able to independently self-monitor and correct  minimal oral residue with a second swallow.  Pt also demonstrates  a delayed swallow initiation with all consistencies, however, pt  demonstrated minimal silent penetration of thin liquids X 1  throughout the entire session that cleared with a cued cough.  Minimal pharyngeal residue was noted throughout the session with  puree and regular consistencies, however, pt independently  utilized a second swallow to clear.  Recommend upgrade to regular  textures with thin liquids with intermittent supervision for  utilization of small bites/sips, multiple swallows and a slow  pace.  Swallow Evaluation Recommendations Diet Recommendations: Regular;Thin liquid Liquid Administration via: Cup;Straw Medication Administration: Whole meds with puree Supervision: Patient able to self feed;Intermittent supervision  to cue for compensatory strategies Compensations: Slow rate;Small sips/bites;Multiple dry swallows  after each bite/sip Postural Changes and/or Swallow Maneuvers: Seated upright 90  degrees Oral Care Recommendations: Oral care BID Follow up Recommendations: None Prognosis Prognosis for Safe Diet Advancement: Good Individuals Consulted Consulted and Agree with Results and Recommendations:  Patient;Family member/caregiver Family Member Consulted: wife  SLP Assessment/Plan   General:  Date of Onset: 01/15/13 HPI: Pt is a 67 y.o. male with a history of brain tumor status  post radiation in 1982 who has been having difficulty with falls  over the past year. He had a stroke in April of this year, and  had difficulty following that but had improved to the point that  he was able to walk without a cane.  Over the past couple weeks,  however, he has had worsening of his gait and then he had a fall  down some stairs and was unable to get up from the bottom. On the  morning of 01/15/13, pt was found to have altered mentation and  per RN has seemed to have difficulty managing his secretions  since that time.  Pt had MBS on 11/14 and was recommended Dys. 3  textures with nectar-thick liquids. Pt transferred to [facility]  01/20/13  and has been participating in dysphagia treatment.  Repeat MBS today to assess for possible upgrade.   Type of Study: Modified Barium Swallowing Study Reason for Referral: Objectively evaluate swallowing function Previous Swallow Assessment: MBS on 11/14 and recommended Dys. 3  textures with nectar-thick liquids  Diet Prior to this Study: Regular;Thin liquids Temperature Spikes Noted: No Respiratory Status: Room air History of Recent Intubation: No Behavior/Cognition: Alert;Cooperative;Pleasant mood;Requires  cueing Oral Cavity - Dentition: Adequate natural dentition Oral Motor / Sensory Function: Impaired - see Bedside swallow  eval Self-Feeding Abilities: Able to feed self Patient Positioning: Upright in chair Baseline Vocal Quality: Clear Volitional Cough: Strong Volitional Swallow: Able to elicit Anatomy: Within functional limits Pharyngeal Secretions: Not observed secondary MBS  Reason for Referral:  Objectively evaluate swallowing function   Oral Phase Oral Preparation/Oral Phase Oral Phase: Impaired Oral - Solids Oral - Regular: Delayed oral transit Pharyngeal Phase  Pharyngeal Phase Pharyngeal Phase: Impaired Pharyngeal - Honey Pharyngeal - Honey Teaspoon: Not tested Pharyngeal - Honey Cup: Not tested;Delayed swallow initiation Pharyngeal - Nectar Pharyngeal - Nectar Teaspoon: Not tested Pharyngeal - Nectar Cup: Delayed swallow initiation Pharyngeal - Thin Pharyngeal - Thin Teaspoon: Delayed swallow initiation Penetration/Aspiration details (thin teaspoon): Material does not  enter airway  Pharyngeal - Thin Cup: Delayed swallow initiation Penetration/Aspiration details (thin cup): Material enters  airway, remains ABOVE vocal cords and not ejected out Pharyngeal - Thin Straw: Delayed swallow initiation Pharyngeal - Solids Pharyngeal - Puree: Delayed swallow initiation Pharyngeal - Regular: Delayed swallow initiation Pharyngeal Phase - Comment Pharyngeal Comment: Pt demonstrated silent penetration X 1 of 5  trials including a straw  Cervical Esophageal Phase  Cervical Esophageal Phase Cervical Esophageal Phase: WFL   G-Codes    PAYNE, COURTNEY 02-11-13, 2:41 PM      Microbiology: No results found for this or any previous visit (from the past 240 hour(s)).   Labs: Basic Metabolic Panel:  Recent Labs Lab 02/18/13 1307 02/18/13 1318 02/19/13 0600  NA 140 140 139  K 4.0 4.1 3.5  CL 102 104 103  CO2 28  --  27  GLUCOSE 81 85 76  BUN 21 24* 17  CREATININE 1.06 1.20 0.91  CALCIUM 9.6  --  8.8   Liver Function Tests:  Recent Labs Lab 02/18/13 1307 02/19/13 0600  AST 21 17  ALT 17 13  ALKPHOS 63 52  BILITOT 0.2* 0.2*  PROT 7.3 6.2  ALBUMIN 3.8 3.3*   No results found for this basename: LIPASE, AMYLASE,  in the last 168 hours No results found for this basename: AMMONIA,  in the last 168 hours CBC:  Recent Labs Lab 02/18/13 1307 02/18/13 1318 02/19/13 0600  WBC 6.9  --  6.2  NEUTROABS 4.7  --   --   HGB 13.1 14.6 11.7*  HCT 40.5 43.0 34.8*  MCV 91.8  --  89.7  PLT 221  --  209   Cardiac Enzymes:  Recent Labs Lab 02/18/13 1307  TROPONINI <0.30   BNP: BNP (last 3 results) No results found for this basename: PROBNP,  in the last 8760 hours CBG:  Recent Labs Lab 02/18/13 1526  GLUCAP 83       Signed:  Deral Schellenberg  [facility] 02/19/2013, 11:28 AM

## 2013-02-19 NOTE — Evaluation (Signed)
Occupational Therapy Evaluation Patient Details Name: Rodney Potts MRN: [id number] DOB: 1945/04/15 Today's Date: 02/19/2013 Time: 1030-1100 OT Time Calculation (min): 30 min  OT Assessment / Plan / Recommendation History of present illness pt presents with new onset of R sided weakness and slurred speech.  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     Clinical Impression   Patient evaluated by Occupational Therapy with no further acute OT needs identified. All education has been completed and the patient has no further questions.  See below for any follow-up Occupational Therapy or equipment needs. Patient to be discharged today and acute OT is signing off. Thank you for this referral.     OT Assessment  All further OT needs can be met in the next venue of care    Follow Up Recommendations  Outpatient OT;Supervision/Assistance - 24 hour    Equipment Recommendations  None recommended by OT (has OT DME)    Precautions / Restrictions Precautions Precautions: Fall Precaution Comments:  h/o falls Restrictions Weight Bearing Restrictions: No   Pertinent Vitals/Pain 3/10 mid back, RN notified and medication provided.  Rest and repositioned    ADL  Lower Body Bathing: Simulated;Supervision/safety Where Assessed - Lower Body Bathing: Supported standing;Supported sit to stand;Unsupported sitting Lower Body Dressing: Simulated;Performed;Supervision/safety Where Assessed - Lower Body Dressing: Supported standing;Supported sit to stand;Unsupported sitting Toilet Transfer: Research scientist (life sciences) Method: Sit to stand;Stand pivot Acupuncturist: Comfort height toilet;Grab bars Toileting - Clothing Manipulation and Hygiene: Performed;Minimal assistance Where Assessed - Engineer, mining and Hygiene: Sit on 3-in-1 or toilet;Standing;Sit to stand from 3-in-1 or toilet Transfers/Ambulation Related to ADLs: Patient  ambulated with [facility] with occasional vcs to use his cane at all times instead of "taking his cane for a walk", patient required max cues for bed mobility techniques. ADL Comments: Patient had difficulty with hosp gown during toileting-resulting in removal of gown due to BM on gown.  Wife and patient report that patient is currently back to baseline and wife is off work for the rest of the month to provide supervision at home and with holiday travels.     Visit Information  Last OT Received On: 02/19/13 Assistance Needed: +1 History of Present Illness: pt presents with new onset of R sided weakness and slurred speech.  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         Prior Functioning     Home Living Family/patient expects to be discharged to:: Private residence Living Arrangements: Spouse/significant other Available Help at Discharge: Family;Available PRN/intermittently Type of Home: House Home Access: Stairs to enter [employer] of Steps: 3-4 Home Layout: One level Home Equipment: Cane - single point;Walker - 2 wheels;Bedside commode;Shower seat Additional Comments: pt states he and his wife are moving into a new home within the next 6 months.  Lives With: Spouse Prior Function Level of Independence: Independent with assistive device(s) Comments: uses a SPC or RW Communication Communication: Receptive difficulties;Expressive difficulties Dominant Hand: Right    Cognition  Cognition Arousal/Alertness: Awake/alert Behavior During Therapy: WFL for tasks assessed/performed Overall Cognitive Status: History of cognitive impairments - at baseline    Extremity/Trunk Assessment Upper Extremity Assessment Upper Extremity Assessment: Overall WFL for tasks assessed;LUE deficits/detail LUE Deficits / Details: weak and decreased coordination from previous CVA, occasionally uses LUE during functional mobility and during BADL tasks today.  Most successful  as an active assist. Lower Extremity Assessment Lower Extremity  Assessment: Defer to PT evaluation RLE Deficits / Details: Generally weak, however pt states he feels it is back to normal.   RLE Coordination: decreased fine motor;decreased gross motor LLE Deficits / Details: pt generally weak on L from previous CVA.   LLE Coordination: decreased fine motor;decreased gross motor     Mobility Bed Mobility Bed Mobility: Sitting - Scoot to Edge of Bed;Sit to Sidelying Left Supine to Sit: 5: Supervision Sitting - Scoot to Edge of Bed: 5: Supervision Sit to Supine: 5: Supervision Sit to Sidelying Left: 5: Supervision Details for Bed Mobility Assistance: pt moves slowly, required max cues for technique and requires increased time to complete.   Transfers Sit to Stand: 4: Min guard;With upper extremity assist;From bed;From toilet Stand to Sit: 4: Min guard;With upper extremity assist;To chair/3-in-1;To bed Details for Transfer Assistance: Mildly unsteady.       End of Session OT - End of Session Equipment Utilized During Treatment: Other (comment) [facility]) Activity Tolerance: Patient tolerated treatment well Patient left: in bed;with family/visitor present;with call bell/phone within reach  GO Functional Assessment Tool Used: clinical observation Functional Limitation: Self care Self Care Current Status (U9811): At least 80 percent but less than 100 percent impaired, limited or restricted Self Care Goal Status (B1478): At least 80 percent but less than 100 percent impaired, limited or restricted Self Care Discharge Status [phone]): At least 80 percent but less than 100 percent impaired, limited or restricted   Nolita Kutter 02/19/2013, 11:45 AM

## 2013-03-09 ENCOUNTER — Ambulatory Visit: Payer: Medicare Other | Admitting: Physical Therapy

## 2013-03-09 ENCOUNTER — Ambulatory Visit: Payer: Medicare Other | Attending: Physical Medicine & Rehabilitation | Admitting: Occupational Therapy

## 2013-03-09 DIAGNOSIS — R269 Unspecified abnormalities of gait and mobility: Secondary | ICD-10-CM | POA: Insufficient documentation

## 2013-03-09 DIAGNOSIS — R293 Abnormal posture: Secondary | ICD-10-CM | POA: Insufficient documentation

## 2013-03-09 DIAGNOSIS — R4189 Other symptoms and signs involving cognitive functions and awareness: Secondary | ICD-10-CM | POA: Insufficient documentation

## 2013-03-09 DIAGNOSIS — Z5189 Encounter for other specified aftercare: Secondary | ICD-10-CM | POA: Insufficient documentation

## 2013-03-09 DIAGNOSIS — R279 Unspecified lack of coordination: Secondary | ICD-10-CM | POA: Insufficient documentation

## 2013-03-09 DIAGNOSIS — M6281 Muscle weakness (generalized): Secondary | ICD-10-CM | POA: Insufficient documentation

## 2013-03-10 ENCOUNTER — Encounter: Payer: Medicare Other | Admitting: Occupational Therapy

## 2013-03-10 ENCOUNTER — Ambulatory Visit: Payer: Medicare Other | Admitting: Physical Therapy

## 2013-03-11 ENCOUNTER — Ambulatory Visit: Payer: Medicare Other | Admitting: Physical Therapy

## 2013-03-11 ENCOUNTER — Ambulatory Visit: Payer: Medicare Other | Admitting: Occupational Therapy

## 2013-03-12 ENCOUNTER — Encounter: Payer: Medicare Other | Admitting: Occupational Therapy

## 2013-03-12 ENCOUNTER — Ambulatory Visit: Payer: Medicare Other | Admitting: Physical Therapy

## 2013-03-16 ENCOUNTER — Ambulatory Visit: Payer: Medicare Other | Admitting: Physical Therapy

## 2013-03-16 ENCOUNTER — Ambulatory Visit: Payer: Medicare Other | Admitting: Occupational Therapy

## 2013-03-17 ENCOUNTER — Encounter: Payer: Medicare Other | Admitting: Occupational Therapy

## 2013-03-17 ENCOUNTER — Ambulatory Visit: Payer: Medicare Other | Admitting: Physical Therapy

## 2013-03-18 ENCOUNTER — Ambulatory Visit: Payer: Medicare Other | Admitting: Physical Therapy

## 2013-03-18 ENCOUNTER — Ambulatory Visit: Payer: Medicare Other | Admitting: Occupational Therapy

## 2013-03-19 ENCOUNTER — Encounter: Payer: Medicare Other | Admitting: Occupational Therapy

## 2013-03-19 ENCOUNTER — Ambulatory Visit: Payer: Medicare Other | Admitting: Physical Therapy

## 2013-03-23 ENCOUNTER — Ambulatory Visit: Payer: Medicare Other | Admitting: Occupational Therapy

## 2013-03-23 ENCOUNTER — Ambulatory Visit: Payer: Medicare Other | Admitting: Physical Therapy

## 2013-03-25 ENCOUNTER — Ambulatory Visit: Payer: Medicare Other | Admitting: Physical Therapy

## 2013-03-25 ENCOUNTER — Ambulatory Visit: Payer: Medicare Other | Admitting: Occupational Therapy

## 2013-03-26 ENCOUNTER — Encounter: Payer: Medicare Other | Admitting: Occupational Therapy

## 2013-03-26 ENCOUNTER — Ambulatory Visit: Payer: Medicare Other | Admitting: Physical Therapy

## 2013-03-30 ENCOUNTER — Ambulatory Visit: Payer: Medicare Other | Admitting: Physical Therapy

## 2013-03-30 ENCOUNTER — Ambulatory Visit: Payer: Medicare Other | Admitting: Occupational Therapy

## 2013-04-01 ENCOUNTER — Ambulatory Visit: Payer: Medicare Other | Admitting: Physical Therapy

## 2013-04-01 ENCOUNTER — Ambulatory Visit: Payer: Medicare Other | Admitting: Occupational Therapy

## 2013-04-06 ENCOUNTER — Ambulatory Visit: Payer: Medicare Other | Admitting: Occupational Therapy

## 2013-04-06 ENCOUNTER — Ambulatory Visit: Payer: Medicare Other | Attending: Physical Medicine & Rehabilitation | Admitting: Physical Therapy

## 2013-04-06 ENCOUNTER — Ambulatory Visit: Payer: Medicare Other | Admitting: Nurse Practitioner

## 2013-04-06 DIAGNOSIS — R269 Unspecified abnormalities of gait and mobility: Secondary | ICD-10-CM | POA: Insufficient documentation

## 2013-04-06 DIAGNOSIS — R293 Abnormal posture: Secondary | ICD-10-CM | POA: Insufficient documentation

## 2013-04-06 DIAGNOSIS — R279 Unspecified lack of coordination: Secondary | ICD-10-CM | POA: Insufficient documentation

## 2013-04-06 DIAGNOSIS — R4189 Other symptoms and signs involving cognitive functions and awareness: Secondary | ICD-10-CM | POA: Insufficient documentation

## 2013-04-06 DIAGNOSIS — Z5189 Encounter for other specified aftercare: Secondary | ICD-10-CM | POA: Insufficient documentation

## 2013-04-06 DIAGNOSIS — M6281 Muscle weakness (generalized): Secondary | ICD-10-CM | POA: Insufficient documentation

## 2013-04-08 ENCOUNTER — Ambulatory Visit: Payer: Medicare Other | Admitting: Physical Therapy

## 2013-04-13 ENCOUNTER — Ambulatory Visit: Payer: Medicare Other | Admitting: Physical Therapy

## 2013-04-13 ENCOUNTER — Ambulatory Visit: Payer: Medicare Other | Admitting: Occupational Therapy

## 2013-04-14 ENCOUNTER — Telehealth: Payer: Self-pay | Admitting: *Deleted

## 2013-04-14 MED ORDER — DONEPEZIL HCL 10 MG PO TABS: 10.0000 mg | ORAL_TABLET | Freq: Every day | ORAL | Status: DC

## 2013-04-14 NOTE — Telephone Encounter (Signed)
Rx has been sent  

## 2013-04-15 ENCOUNTER — Ambulatory Visit: Payer: Medicare Other | Admitting: Physical Therapy

## 2013-04-16 ENCOUNTER — Encounter (HOSPITAL_COMMUNITY): Payer: Self-pay | Admitting: Emergency Medicine

## 2013-04-16 ENCOUNTER — Emergency Department (HOSPITAL_COMMUNITY): Payer: Medicare Other

## 2013-04-16 ENCOUNTER — Inpatient Hospital Stay (HOSPITAL_COMMUNITY)
Admission: EM | Admit: 2013-04-16 | Discharge: 2013-04-24 | DRG: 470 | Disposition: A | Discharge status: Skilled Nursing Facility | Payer: Medicare Other | Attending: Internal Medicine | Admitting: Internal Medicine

## 2013-04-16 DIAGNOSIS — R29810 Facial weakness: Secondary | ICD-10-CM | POA: Diagnosis not present

## 2013-04-16 DIAGNOSIS — I639 Cerebral infarction, unspecified: Secondary | ICD-10-CM

## 2013-04-16 DIAGNOSIS — R569 Unspecified convulsions: Secondary | ICD-10-CM

## 2013-04-16 DIAGNOSIS — R410 Disorientation, unspecified: Secondary | ICD-10-CM

## 2013-04-16 DIAGNOSIS — S7290XA Unspecified fracture of unspecified femur, initial encounter for closed fracture: Secondary | ICD-10-CM

## 2013-04-16 DIAGNOSIS — I6350 Cerebral infarction due to unspecified occlusion or stenosis of unspecified cerebral artery: Secondary | ICD-10-CM

## 2013-04-16 DIAGNOSIS — Y92009 Unspecified place in unspecified non-institutional (private) residence as the place of occurrence of the external cause: Secondary | ICD-10-CM

## 2013-04-16 DIAGNOSIS — N390 Urinary tract infection, site not specified: Secondary | ICD-10-CM

## 2013-04-16 DIAGNOSIS — G459 Transient cerebral ischemic attack, unspecified: Secondary | ICD-10-CM

## 2013-04-16 DIAGNOSIS — R262 Difficulty in walking, not elsewhere classified: Secondary | ICD-10-CM

## 2013-04-16 DIAGNOSIS — R4701 Aphasia: Secondary | ICD-10-CM

## 2013-04-16 DIAGNOSIS — R4781 Slurred speech: Secondary | ICD-10-CM

## 2013-04-16 DIAGNOSIS — S72033A Displaced midcervical fracture of unspecified femur, initial encounter for closed fracture: Principal | ICD-10-CM | POA: Diagnosis present

## 2013-04-16 DIAGNOSIS — C719 Malignant neoplasm of brain, unspecified: Secondary | ICD-10-CM

## 2013-04-16 DIAGNOSIS — S72019A Unspecified intracapsular fracture of unspecified femur, initial encounter for closed fracture: Secondary | ICD-10-CM

## 2013-04-16 DIAGNOSIS — R270 Ataxia, unspecified: Secondary | ICD-10-CM

## 2013-04-16 DIAGNOSIS — R5381 Other malaise: Secondary | ICD-10-CM

## 2013-04-16 DIAGNOSIS — S7292XA Unspecified fracture of left femur, initial encounter for closed fracture: Secondary | ICD-10-CM | POA: Diagnosis present

## 2013-04-16 DIAGNOSIS — R279 Unspecified lack of coordination: Secondary | ICD-10-CM | POA: Diagnosis present

## 2013-04-16 DIAGNOSIS — W010XXA Fall on same level from slipping, tripping and stumbling without subsequent striking against object, initial encounter: Secondary | ICD-10-CM | POA: Diagnosis present

## 2013-04-16 DIAGNOSIS — R29818 Other symptoms and signs involving the nervous system: Secondary | ICD-10-CM

## 2013-04-16 DIAGNOSIS — R404 Transient alteration of awareness: Secondary | ICD-10-CM | POA: Diagnosis present

## 2013-04-16 DIAGNOSIS — K589 Irritable bowel syndrome without diarrhea: Secondary | ICD-10-CM

## 2013-04-16 DIAGNOSIS — E785 Hyperlipidemia, unspecified: Secondary | ICD-10-CM | POA: Diagnosis present

## 2013-04-16 DIAGNOSIS — G40909 Epilepsy, unspecified, not intractable, without status epilepticus: Secondary | ICD-10-CM | POA: Diagnosis present

## 2013-04-16 DIAGNOSIS — R269 Unspecified abnormalities of gait and mobility: Secondary | ICD-10-CM

## 2013-04-16 DIAGNOSIS — R4789 Other speech disturbances: Secondary | ICD-10-CM | POA: Diagnosis not present

## 2013-04-16 LAB — BASIC METABOLIC PANEL
BUN: 17 mg/dL (ref 6–23)
CO2: 28 mEq/L (ref 19–32)
CREATININE: 1.14 mg/dL (ref 0.50–1.35)
Calcium: 9.5 mg/dL (ref 8.4–10.5)
Chloride: 96 mEq/L (ref 96–112)
GFR, EST AFRICAN AMERICAN: 76 mL/min — AB (ref >90)
GFR, EST NON AFRICAN AMERICAN: 65 mL/min — AB (ref >90)
Glucose, Bld: 101 mg/dL — ABNORMAL HIGH (ref 70–99)
POTASSIUM: 4.2 meq/L (ref 3.7–5.3)
Sodium: 135 mEq/L — ABNORMAL LOW (ref 137–147)

## 2013-04-16 LAB — CBC WITH DIFFERENTIAL/PLATELET
BASOS ABS: 0 10*3/uL (ref 0.0–0.1)
Basophils Relative: 0 % (ref 0–1)
EOS ABS: 0 10*3/uL (ref 0.0–0.7)
Eosinophils Relative: 0 % (ref 0–5)
HCT: 40.5 % (ref 39.0–52.0)
HEMOGLOBIN: 13 g/dL (ref 13.0–17.0)
Lymphocytes Relative: 7 % — ABNORMAL LOW (ref 12–46)
Lymphs Abs: 0.8 10*3/uL (ref 0.7–4.0)
MCH: 28.4 pg (ref 26.0–34.0)
MCHC: 32.1 g/dL (ref 30.0–36.0)
MCV: 88.4 fL (ref 78.0–100.0)
MONOS PCT: 7 % (ref 3–12)
Monocytes Absolute: 0.8 10*3/uL (ref 0.1–1.0)
NEUTROS ABS: 9.7 10*3/uL — AB (ref 1.7–7.7)
Neutrophils Relative %: 85 % — ABNORMAL HIGH (ref 43–77)
PLATELETS: 237 10*3/uL (ref 150–400)
RBC: 4.58 MIL/uL (ref 4.22–5.81)
RDW: 15.2 % (ref 11.5–15.5)
WBC: 11.4 10*3/uL — ABNORMAL HIGH (ref 4.0–10.5)

## 2013-04-16 LAB — TYPE AND SCREEN
ABO/RH(D): O POS
Antibody Screen: NEGATIVE

## 2013-04-16 LAB — PROTIME-INR
INR: 1.04 (ref 0.00–1.49)
PROTHROMBIN TIME: 13.4 s (ref 11.6–15.2)

## 2013-04-16 MED ORDER — ATORVASTATIN CALCIUM 40 MG PO TABS
40.0000 mg | ORAL_TABLET | Freq: Every day | ORAL | Status: DC
  Administered 2013-04-16 – 2013-04-23 (×8): 40 mg via ORAL
  Filled 2013-04-16 (×10): qty 1

## 2013-04-16 MED ORDER — FENTANYL CITRATE 0.05 MG/ML IJ SOLN
50.0000 ug | INTRAMUSCULAR | Status: AC | PRN
  Administered 2013-04-16 (×2): 50 ug via INTRAVENOUS
  Filled 2013-04-16 (×2): qty 2

## 2013-04-16 MED ORDER — LEVETIRACETAM 750 MG PO TABS
750.0000 mg | ORAL_TABLET | Freq: Two times a day (BID) | ORAL | Status: DC
  Administered 2013-04-16 – 2013-04-24 (×16): 750 mg via ORAL
  Filled 2013-04-16 (×18): qty 1

## 2013-04-16 MED ORDER — SODIUM CHLORIDE 0.9 % IV SOLN
INTRAVENOUS | Status: DC
  Administered 2013-04-17 – 2013-04-21 (×5): via INTRAVENOUS

## 2013-04-16 MED ORDER — FENTANYL CITRATE 0.05 MG/ML IJ SOLN
12.5000 ug | INTRAMUSCULAR | Status: DC | PRN
  Administered 2013-04-17: 12.5 ug via INTRAVENOUS
  Administered 2013-04-17: 25 ug via INTRAVENOUS
  Filled 2013-04-16 (×2): qty 2

## 2013-04-16 MED ORDER — FESOTERODINE FUMARATE ER 8 MG PO TB24
8.0000 mg | ORAL_TABLET | Freq: Every day | ORAL | Status: DC
  Administered 2013-04-17 – 2013-04-24 (×8): 8 mg via ORAL
  Filled 2013-04-16 (×9): qty 1

## 2013-04-16 MED ORDER — DONEPEZIL HCL 10 MG PO TABS
10.0000 mg | ORAL_TABLET | Freq: Every day | ORAL | Status: DC
  Administered 2013-04-16 – 2013-04-23 (×8): 10 mg via ORAL
  Filled 2013-04-16 (×10): qty 1

## 2013-04-16 MED ORDER — LORATADINE 10 MG PO TABS
10.0000 mg | ORAL_TABLET | Freq: Every day | ORAL | Status: DC
  Administered 2013-04-17 – 2013-04-24 (×8): 10 mg via ORAL
  Filled 2013-04-16 (×8): qty 1

## 2013-04-16 MED ORDER — ENOXAPARIN SODIUM 40 MG/0.4ML ~~LOC~~ SOLN
40.0000 mg | Freq: Every day | SUBCUTANEOUS | Status: DC
  Administered 2013-04-16: 40 mg via SUBCUTANEOUS
  Filled 2013-04-16 (×2): qty 0.4

## 2013-04-16 MED ORDER — PHENOBARBITAL 32.4 MG PO TABS
97.2000 mg | ORAL_TABLET | Freq: Every day | ORAL | Status: DC
  Administered 2013-04-16 – 2013-04-23 (×8): 97.2 mg via ORAL
  Filled 2013-04-16: qty 3
  Filled 2013-04-16 (×3): qty 1
  Filled 2013-04-16: qty 3
  Filled 2013-04-16 (×2): qty 1

## 2013-04-16 MED ORDER — FLUTICASONE PROPIONATE 50 MCG/ACT NA SUSP
2.0000 | Freq: Every day | NASAL | Status: DC
  Administered 2013-04-17 – 2013-04-24 (×7): 2 via NASAL
  Filled 2013-04-16 (×2): qty 16

## 2013-04-16 MED ORDER — ONDANSETRON HCL 4 MG/2ML IJ SOLN: 4.0000 mg | Freq: Once | INTRAMUSCULAR | Status: DC

## 2013-04-16 MED ORDER — SODIUM CHLORIDE 0.9 % IV SOLN
INTRAVENOUS | Status: AC
  Administered 2013-04-16: 22:00:00 via INTRAVENOUS

## 2013-04-16 MED ORDER — ONDANSETRON HCL 4 MG/2ML IJ SOLN: 4.0000 mg | Freq: Four times a day (QID) | INTRAMUSCULAR | Status: DC | PRN

## 2013-04-16 MED ORDER — HYDROCODONE-ACETAMINOPHEN 5-325 MG PO TABS
1.0000 | ORAL_TABLET | Freq: Four times a day (QID) | ORAL | Status: DC | PRN
  Administered 2013-04-16 – 2013-04-17 (×3): 1 via ORAL
  Filled 2013-04-16 (×3): qty 1

## 2013-04-16 NOTE — Consult Note (Signed)
Reason for Consult:Hip Fracture Referring Physician: PRATYUSH AMMON Rodney Potts is an 68 y.o. male.  HPI: Patient suffered from a fall at home, approxmately 6pm this evening. He was changing clothes when he slipped and fell directly on his left hip. He denied any other injury's. He immediately had pain in the left hip. He denied SOB, CP, or change in LOC. He does have a history of TIA's and is on Plavix. He is a patient of Dr.Beans, who performed bilateral TKA's.   Past Medical History  Diagnosis Date  . Brain tumor     glioma; S/P radiation and chemotherapy (01/14/2013)  . TIA (transient ischemic attack) ~ 2011  . IBS (irritable bowel syndrome)   . Shingles   . CVA (cerebral infarction)     R thalamic  . Allergy   . Complication of anesthesia     "after knee OR woke up very wild; pulling things out, etc" (01/14/2013)  . High cholesterol   . Sleep apnea     "corrected w/OR" (01/14/2013)  . Seizures     "hx from the scar tissue where brain tumor was; on RX to control" (01/14/2013)  . Stroke     "he's had a couple; still a little weaker on the left leg, drags that foot" (01/14/2013)  . Prostate cancer     Past Surgical History  Procedure Laterality Date  . Cataract extraction w/ intraocular lens implant Bilateral 2000's    "don't know if they put lens in" (01/14/2013)  . Joint replacement    . Prostatectomy  1990's  . Uvulopalatopharyngoplasty  1980's    "for sleep apnea" (01/14/2013)  . Shoulder arthroscopy Right 2000's  . Total knee arthroplasty Bilateral 2000's    Family History  Problem Relation Age of Onset  . Hypertension Father   . Transient ischemic attack Mother   . Cancer Mother     Social History:  reports that he quit smoking about 50 years ago. His smoking use included Cigarettes. He has a .48 pack-year smoking history. He has never used smokeless tobacco. He reports that he drinks alcohol. He reports that he does not use illicit drugs.  Allergies:  Allergies   Allergen Reactions  . Amoxicillin Diarrhea  . Dilaudid [Hydromorphone Hcl] Other (See Comments)    DELUSIONS  . Lactose Intolerance (Gi) Diarrhea  . Sulfa Antibiotics Other (See Comments)    Cant control bladder    Medications: I have reviewed the patient's current medications.  Results for orders placed during the hospital encounter of 04/16/13 (from the past 48 hour(s))  BASIC METABOLIC PANEL     Status: Abnormal   Collection Time    04/16/13  8:23 PM      Result Value Ref Range   Sodium 135 (*) 137 - 147 mEq/Potts   Potassium 4.2  3.7 - 5.3 mEq/Potts   Chloride 96  96 - 112 mEq/Potts   CO2 28  19 - 32 mEq/Potts   Glucose, Bld 101 (*) 70 - 99 mg/dL   BUN 17  6 - 23 mg/dL   Creatinine, Ser 1.14  0.50 - 1.35 mg/dL   Calcium 9.5  8.4 - 10.5 mg/dL   GFR calc non Af Amer 65 (*) >90 mL/min   GFR calc Af Amer 76 (*) >90 mL/min   Comment: (NOTE)     The eGFR has been calculated using the CKD EPI equation.     This calculation has not been validated in all clinical situations.  eGFR's persistently <90 mL/min signify possible Chronic Kidney     Disease.  CBC WITH DIFFERENTIAL     Status: Abnormal   Collection Time    04/16/13  8:23 PM      Result Value Ref Range   WBC 11.4 (*) 4.0 - 10.5 K/uL   RBC 4.58  4.22 - 5.81 MIL/uL   Hemoglobin 13.0  13.0 - 17.0 g/dL   HCT 40.5  39.0 - 52.0 %   MCV 88.4  78.0 - 100.0 fL   MCH 28.4  26.0 - 34.0 pg   MCHC 32.1  30.0 - 36.0 g/dL   RDW 15.2  11.5 - 15.5 %   Platelets 237  150 - 400 K/uL   Neutrophils Relative % 85 (*) 43 - 77 %   Neutro Abs 9.7 (*) 1.7 - 7.7 K/uL   Lymphocytes Relative 7 (*) 12 - 46 %   Lymphs Abs 0.8  0.7 - 4.0 K/uL   Monocytes Relative 7  3 - 12 %   Monocytes Absolute 0.8  0.1 - 1.0 K/uL   Eosinophils Relative 0  0 - 5 %   Eosinophils Absolute 0.0  0.0 - 0.7 K/uL   Basophils Relative 0  0 - 1 %   Basophils Absolute 0.0  0.0 - 0.1 K/uL  PROTIME-INR     Status: None   Collection Time    04/16/13  8:23 PM      Result Value  Ref Range   Prothrombin Time 13.4  11.6 - 15.2 seconds   INR 1.04  0.00 - 1.49  TYPE AND SCREEN     Status: None   Collection Time    04/16/13  8:23 PM      Result Value Ref Range   ABO/RH(D) O POS     Antibody Screen PENDING     Sample Expiration 04/19/2013      Dg Chest 1 View  04/16/2013   CLINICAL DATA:  Fall.  Hip pain.  EXAM: CHEST - 1 VIEW  COMPARISON:  01/15/2013  FINDINGS: Cardiac silhouette is normal in size. Aorta is mildly uncoiled. No mediastinal or hilar masses.  Clear lungs.  The bony thorax is intact.  IMPRESSION: No active disease.   Electronically Signed   By: Lajean Manes M.D.   On: 04/16/2013 20:02   Dg Femur Left  04/16/2013   CLINICAL DATA:  Recent traumatic injury with pain  EXAM: LEFT FEMUR - 2 VIEW  COMPARISON:  None.  FINDINGS: There is a subcapital femoral neck fracture identified with mild impaction at the fracture site. No other acute abnormality is noted. A prior left knee prosthesis is seen.  IMPRESSION: Subcapital left femoral neck fracture.   Electronically Signed   By: Inez Catalina M.D.   On: 04/16/2013 19:59    Review of Systems  Constitutional: Negative.   HENT: Negative.   Eyes: Negative.   Respiratory: Negative.   Cardiovascular: Negative.   Gastrointestinal: Negative.   Genitourinary: Negative.   Musculoskeletal: Positive for falls and joint pain.  Skin: Negative.   Neurological: Negative.   Endo/Heme/Allergies: Negative.   Psychiatric/Behavioral: Negative.    Blood pressure 164/97, pulse 73, temperature 98.4 F (36.9 C), temperature source Oral, resp. rate 18, SpO2 95.00%. Physical Exam  Constitutional: He is oriented to person, place, and time. He appears well-developed.  Eyes: EOM are normal.  Neck: Normal range of motion.  Cardiovascular: Normal rate.   Respiratory: Effort normal.  GI: Soft.  Genitourinary:  Deferred  Musculoskeletal: He exhibits edema and tenderness.  LLE NVI with 2+ pedal pulse and sensation intact to light  touch. Compartments are soft. Slight external rotation of the LLE. Tender to Left hip region.   Neurological: He is alert and oriented to person, place, and time.  Skin: Skin is warm and dry.  Psychiatric: His behavior is normal.    Assessment/Plan: Left subcapital Hip fracture, displaced: Dr.Olin to see in am to discuss surgical intervention. NPO after [place] traction LLE 5lbs. Hold Plavix at this time. Admit to medicine. Question encouraged and answered. Plan Discussed with Dr.Collins.  Rodney Potts 04/16/2013, 9:05 PM

## 2013-04-16 NOTE — ED Provider Notes (Signed)
Medical screening examination/treatment/procedure(s) were conducted as a shared visit with non-physician practitioner(s) and myself.  I personally evaluated the patient during the encounter.    Patient with hip fracture, NV intact. Pain controlled, will consult ortho and admit.  Ephraim Hamburger, MD 04/16/13 2237

## 2013-04-16 NOTE — ED Notes (Signed)
Patient states that he feel and is having pain to his left hip. The patient is unable to bear wieght

## 2013-04-16 NOTE — ED Notes (Signed)
Bed: WA03 Expected date:  Expected time:  Means of arrival:  Comments: ems- 68 yo M, fall,hip pain

## 2013-04-16 NOTE — ED Provider Notes (Signed)
CSN: [id number]     Arrival date & time 04/16/13  1917 History   First MD Initiated Contact with Patient 04/16/13 1919     Chief Complaint  Patient presents with  . Hip Pain     (Consider location/radiation/quality/duration/timing/severity/associated sxs/prior Treatment) HPI Dr. Tonita Cong- Knee replacements in 2012  Pt to the ER via EMS after mechanical fall of slipping on something wet on smooth floor. He has a significant PMH for TIA, stroke, brain tumor, IBS, Seizures, prostate cancer and is on plavix.  He fell right to his left hip but denies hitting his head, injuring his neck or loc. He is unable to bear weight to the left leg due to pain. No obvious shortening. Pain is controlled at this time, 4mg  Morphine given by EMS. Her reports that the last time he ate of drank anything was around lunch time.  Past Medical History  Diagnosis Date  . Brain tumor     glioma; S/P radiation and chemotherapy (01/14/2013)  . TIA (transient ischemic attack) ~ 2011  . IBS (irritable bowel syndrome)   . Shingles   . CVA (cerebral infarction)     R thalamic  . Allergy   . Complication of anesthesia     "after knee OR woke up very wild; pulling things out, etc" (01/14/2013)  . High cholesterol   . Sleep apnea     "corrected w/OR" (01/14/2013)  . Seizures     "hx from the scar tissue where brain tumor was; on RX to control" (01/14/2013)  . Stroke     "he's had a couple; still a little weaker on the left leg, drags that foot" (01/14/2013)  . Prostate cancer    Past Surgical History  Procedure Laterality Date  . Cataract extraction w/ intraocular lens implant Bilateral 2000's    "don't know if they put lens in" (01/14/2013)  . Joint replacement    . Prostatectomy  1990's  . Uvulopalatopharyngoplasty  1980's    "for sleep apnea" (01/14/2013)  . Shoulder arthroscopy Right 2000's  . Total knee arthroplasty Bilateral 2000's   Family History  Problem Relation Age of Onset  . Hypertension  Father   . Transient ischemic attack Mother   . Cancer Mother    History  Substance Use Topics  . Smoking status: Former Smoker -- 0.12 packs/day for 4 years    Types: Cigarettes    Quit date: 04/17/1963  . Smokeless tobacco: Never Used     Comment: 01/14/2013 "quit smoking cigarettes in 1967"  . Alcohol Use: 0.0 oz/week     Comment: 1 glass of wine at night    Review of Systems  The patient denies anorexia, fever, weight loss,, vision loss, decreased hearing, hoarseness, chest pain, syncope, dyspnea on exertion, peripheral edema, balance deficits, hemoptysis, abdominal pain, melena, hematochezia, severe indigestion/heartburn, hematuria, incontinence, genital sores, muscle weakness, suspicious skin lesions, transient blindness, depression, unusual weight change, abnormal bleeding, enlarged lymph nodes, angioedema, and breast masses.   Allergies  Amoxicillin; Dilaudid; Lactose intolerance (gi); and Sulfa antibiotics  Home Medications   Current Outpatient Rx  Name  Route  Sig  Dispense  Refill  . Ascorbic Acid (VITAMIN C PO)   Oral   Take 1 tablet by mouth daily.          Marland Kitchen atorvastatin (LIPITOR) 40 MG tablet   Oral   Take 1 tablet (40 mg total) by mouth at bedtime.   30 tablet   1   . CALCIUM PO  Oral   Take 1 tablet by mouth daily.         . cetirizine (ZYRTEC) 10 MG tablet   Oral   Take 10 mg by mouth daily.         . cholecalciferol (VITAMIN D) 1000 UNITS tablet   Oral   Take 1,000 Units by mouth daily.         . clopidogrel (PLAVIX) 75 MG tablet   Oral   Take 1 tablet (75 mg total) by mouth at bedtime.   30 tablet   1   . donepezil (ARICEPT) 10 MG tablet   Oral   Take 1 tablet (10 mg total) by mouth at bedtime.   90 tablet   1   . fesoterodine (TOVIAZ) 8 MG TB24 tablet   Oral   Take 1 tablet (8 mg total) by mouth daily.   30 tablet   1   . fluticasone (FLONASE) 50 MCG/ACT nasal spray   Each Nare   Place 2 sprays into both nostrils  daily.          Marland Kitchen levETIRAcetam (KEPPRA) 750 MG tablet   Oral   Take 1 tablet (750 mg total) by mouth every 12 (twelve) hours.   60 tablet   1   . Omega-3 Fatty Acids (FISH OIL PO)   Oral   Take 1 capsule by mouth daily.         Marland Kitchen PHENobarbital (LUMINAL) 97.2 MG tablet   Oral   Take 1 tablet (97.2 mg total) by mouth at bedtime.   90 tablet   3   . Specialty Vitamins Products (MAGNESIUM, AMINO ACID CHELATE,) 133 MG tablet   Oral   Take 1 tablet by mouth at bedtime.         Marland Kitchen VITAMIN E PO   Oral   Take 1 tablet by mouth daily.          BP 164/97  Pulse 73  Temp(Src) 98.4 F (36.9 C) (Oral)  Resp 18  SpO2 95% Physical Exam  Nursing note and vitals reviewed. Constitutional: He appears well-developed and well-nourished. No distress.  HENT:  Head: Normocephalic and atraumatic.  Eyes: Pupils are equal, round, and reactive to light.  Neck: Trachea normal and normal range of motion. Neck supple. No spinous process tenderness and no muscular tenderness present. Normal range of motion present.  Cardiovascular: Normal rate and regular rhythm.   Pulmonary/Chest: Effort normal. No respiratory distress. He has no wheezes.  Abdominal: Soft. He exhibits no distension. There is no tenderness.  Musculoskeletal:       Left hip: He exhibits decreased range of motion, decreased strength, tenderness, bony tenderness and swelling. He exhibits no crepitus, no deformity and no laceration.  neurovascularily intact to all four extremities.  Neurological: He is alert.  Skin: Skin is warm and dry.    ED Course  Procedures (including critical care time) Labs Review Labs Reviewed  BASIC METABOLIC PANEL - Abnormal; Notable for the following:    Sodium 135 (*)    Glucose, Bld 101 (*)    GFR calc non Af Amer 65 (*)    GFR calc Af Amer 76 (*)    All other components within normal limits  CBC WITH DIFFERENTIAL - Abnormal; Notable for the following:    WBC 11.4 (*)    Neutrophils  Relative % 85 (*)    Neutro Abs 9.7 (*)    Lymphocytes Relative 7 (*)    All other components within  normal limits  PROTIME-INR  TYPE AND SCREEN   Imaging Review Dg Chest 1 View  04/16/2013   CLINICAL DATA:  Fall.  Hip pain.  EXAM: CHEST - 1 VIEW  COMPARISON:  01/15/2013  FINDINGS: Cardiac silhouette is normal in size. Aorta is mildly uncoiled. No mediastinal or hilar masses.  Clear lungs.  The bony thorax is intact.  IMPRESSION: No active disease.   Electronically Signed   By: Lajean Manes M.D.   On: 04/16/2013 20:02   Dg Femur Left  04/16/2013   CLINICAL DATA:  Recent traumatic injury with pain  EXAM: LEFT FEMUR - 2 VIEW  COMPARISON:  None.  FINDINGS: There is a subcapital femoral neck fracture identified with mild impaction at the fracture site. No other acute abnormality is noted. A prior left knee prosthesis is seen.  IMPRESSION: Subcapital left femoral neck fracture.   Electronically Signed   By: Inez Catalina M.D.   On: 04/16/2013 19:59    EKG Interpretation   None       MDM   Final diagnoses:  Subcapital fracture of femur    Patient has sustained a left subcapital femoral neck fracture. His pain is currently under control. Spoke with [facility] who will consult on patient tonight in the ER.  Will admit to medicine.Ortho has seen patient and will do surgery tomorrow. Pt requesting to eat and can up until midnight. Pt has remained hemodynamically stable in the ED  Inpatient, [facility], team 8, tele     Linus Mako, PA-C 04/16/13 2145

## 2013-04-17 ENCOUNTER — Inpatient Hospital Stay (HOSPITAL_COMMUNITY): Payer: Medicare Other

## 2013-04-17 ENCOUNTER — Inpatient Hospital Stay (HOSPITAL_COMMUNITY): Payer: Medicare Other | Admitting: Anesthesiology

## 2013-04-17 ENCOUNTER — Encounter (HOSPITAL_COMMUNITY)
Admission: EM | Disposition: A | Discharge status: Skilled Nursing Facility | Payer: Self-pay | Source: Home / Self Care | Attending: Internal Medicine

## 2013-04-17 ENCOUNTER — Encounter (HOSPITAL_COMMUNITY): Payer: Self-pay | Admitting: Anesthesiology

## 2013-04-17 ENCOUNTER — Encounter (HOSPITAL_COMMUNITY): Payer: Medicare Other | Admitting: Anesthesiology

## 2013-04-17 DIAGNOSIS — S7290XA Unspecified fracture of unspecified femur, initial encounter for closed fracture: Secondary | ICD-10-CM

## 2013-04-17 HISTORY — PX: HIP ARTHROPLASTY: SHX981

## 2013-04-17 LAB — COMPREHENSIVE METABOLIC PANEL
ALBUMIN: 3.9 g/dL (ref 3.5–5.2)
ALT: 16 U/L (ref 0–53)
AST: 28 U/L (ref 0–37)
Alkaline Phosphatase: 65 U/L (ref 39–117)
BILIRUBIN TOTAL: 0.5 mg/dL (ref 0.3–1.2)
BUN: 17 mg/dL (ref 6–23)
CO2: 27 meq/L (ref 19–32)
Calcium: 9.3 mg/dL (ref 8.4–10.5)
Chloride: 99 mEq/L (ref 96–112)
Creatinine, Ser: 1.01 mg/dL (ref 0.50–1.35)
GFR calc non Af Amer: 75 mL/min — ABNORMAL LOW (ref >90)
GFR, EST AFRICAN AMERICAN: 87 mL/min — AB (ref >90)
GLUCOSE: 82 mg/dL (ref 70–99)
Potassium: 4.6 mEq/L (ref 3.7–5.3)
SODIUM: 139 meq/L (ref 137–147)
Total Protein: 7.1 g/dL (ref 6.0–8.3)

## 2013-04-17 LAB — CBC WITH DIFFERENTIAL/PLATELET
BASOS ABS: 0 10*3/uL (ref 0.0–0.1)
Basophils Relative: 0 % (ref 0–1)
EOS ABS: 0.2 10*3/uL (ref 0.0–0.7)
Eosinophils Relative: 2 % (ref 0–5)
HCT: 40.8 % (ref 39.0–52.0)
Hemoglobin: 13.6 g/dL (ref 13.0–17.0)
Lymphocytes Relative: 10 % — ABNORMAL LOW (ref 12–46)
Lymphs Abs: 0.9 10*3/uL (ref 0.7–4.0)
MCH: 29.4 pg (ref 26.0–34.0)
MCHC: 33.3 g/dL (ref 30.0–36.0)
MCV: 88.3 fL (ref 78.0–100.0)
Monocytes Absolute: 0.8 10*3/uL (ref 0.1–1.0)
Monocytes Relative: 9 % (ref 3–12)
NEUTROS PCT: 79 % — AB (ref 43–77)
Neutro Abs: 7.2 10*3/uL (ref 1.7–7.7)
PLATELETS: 246 10*3/uL (ref 150–400)
RBC: 4.62 MIL/uL (ref 4.22–5.81)
RDW: 15.2 % (ref 11.5–15.5)
WBC: 9.1 10*3/uL (ref 4.0–10.5)

## 2013-04-17 LAB — URINALYSIS, ROUTINE W REFLEX MICROSCOPIC
Bilirubin Urine: NEGATIVE
Glucose, UA: NEGATIVE mg/dL
Ketones, ur: NEGATIVE mg/dL
Nitrite: NEGATIVE
Protein, ur: NEGATIVE mg/dL
Specific Gravity, Urine: 1.014 (ref 1.005–1.030)
Urobilinogen, UA: 0.2 mg/dL (ref 0.0–1.0)
pH: 7 (ref 5.0–8.0)

## 2013-04-17 LAB — URINE MICROSCOPIC-ADD ON

## 2013-04-17 SURGERY — HEMIARTHROPLASTY, HIP, DIRECT ANTERIOR APPROACH, FOR FRACTURE
Anesthesia: General | Site: Hip | Laterality: Left

## 2013-04-17 MED ORDER — 0.9 % SODIUM CHLORIDE (POUR BTL) OPTIME
TOPICAL | Status: DC | PRN
  Administered 2013-04-17: 1000 mL

## 2013-04-17 MED ORDER — GLYCOPYRROLATE 0.2 MG/ML IJ SOLN
INTRAMUSCULAR | Status: DC | PRN
  Administered 2013-04-17: 0.6 mg via INTRAVENOUS

## 2013-04-17 MED ORDER — MORPHINE SULFATE 10 MG/ML IJ SOLN
1.0000 mg | INTRAMUSCULAR | Status: DC | PRN
  Administered 2013-04-17 (×2): 2 mg via INTRAVENOUS

## 2013-04-17 MED ORDER — BISACODYL 10 MG RE SUPP: 10.0000 mg | Freq: Every day | RECTAL | Status: DC | PRN

## 2013-04-17 MED ORDER — SALINE SPRAY 0.65 % NA SOLN
1.0000 | NASAL | Status: DC | PRN
  Administered 2013-04-17: 1 via NASAL
  Filled 2013-04-17: qty 44

## 2013-04-17 MED ORDER — DOCUSATE SODIUM 100 MG PO CAPS
100.0000 mg | ORAL_CAPSULE | Freq: Two times a day (BID) | ORAL | Status: DC
Start: 2013-04-17 — End: 2013-04-24
  Administered 2013-04-17 – 2013-04-24 (×12): 100 mg via ORAL
  Filled 2013-04-17 (×13): qty 1

## 2013-04-17 MED ORDER — FLEET ENEMA 7-19 GM/118ML RE ENEM: 1.0000 | ENEMA | Freq: Once | RECTAL | Status: AC | PRN

## 2013-04-17 MED ORDER — MORPHINE SULFATE 10 MG/ML IJ SOLN
INTRAMUSCULAR | Status: AC
  Filled 2013-04-17: qty 1

## 2013-04-17 MED ORDER — FENTANYL CITRATE 0.05 MG/ML IJ SOLN
INTRAMUSCULAR | Status: DC | PRN
  Administered 2013-04-17: 100 ug via INTRAVENOUS

## 2013-04-17 MED ORDER — LACTATED RINGERS IV SOLN
INTRAVENOUS | Status: DC | PRN
  Administered 2013-04-17 (×2): via INTRAVENOUS

## 2013-04-17 MED ORDER — METOCLOPRAMIDE HCL 10 MG PO TABS
5.0000 mg | ORAL_TABLET | Freq: Three times a day (TID) | ORAL | Status: DC | PRN
  Filled 2013-04-17: qty 1

## 2013-04-17 MED ORDER — ONDANSETRON HCL 4 MG/2ML IJ SOLN
INTRAMUSCULAR | Status: DC | PRN
  Administered 2013-04-17: 4 mg via INTRAVENOUS

## 2013-04-17 MED ORDER — MORPHINE SULFATE 2 MG/ML IJ SOLN
2.0000 mg | INTRAMUSCULAR | Status: DC | PRN
  Administered 2013-04-17 – 2013-04-18 (×3): 2 mg via INTRAVENOUS
  Filled 2013-04-17 (×3): qty 1

## 2013-04-17 MED ORDER — METOCLOPRAMIDE HCL 5 MG/ML IJ SOLN
5.0000 mg | Freq: Three times a day (TID) | INTRAMUSCULAR | Status: DC | PRN
  Filled 2013-04-17: qty 2

## 2013-04-17 MED ORDER — CLINDAMYCIN PHOSPHATE 900 MG/50ML IV SOLN
INTRAVENOUS | Status: AC
  Filled 2013-04-17: qty 50

## 2013-04-17 MED ORDER — FENTANYL CITRATE 0.05 MG/ML IJ SOLN: 25.0000 ug | INTRAMUSCULAR | Status: DC | PRN

## 2013-04-17 MED ORDER — GLYCOPYRROLATE 0.2 MG/ML IJ SOLN
INTRAMUSCULAR | Status: AC
  Filled 2013-04-17: qty 3

## 2013-04-17 MED ORDER — POLYETHYLENE GLYCOL 3350 17 G PO PACK
17.0000 g | PACK | Freq: Every day | ORAL | Status: DC | PRN
  Administered 2013-04-23: 17 g via ORAL
  Filled 2013-04-17: qty 1

## 2013-04-17 MED ORDER — PHENOL 1.4 % MT LIQD: 1.0000 | OROMUCOSAL | Status: DC | PRN

## 2013-04-17 MED ORDER — PROPOFOL 10 MG/ML IV BOLUS
INTRAVENOUS | Status: DC | PRN
  Administered 2013-04-17: 170 mg via INTRAVENOUS

## 2013-04-17 MED ORDER — MEPERIDINE HCL 50 MG/ML IJ SOLN
INTRAMUSCULAR | Status: AC
  Filled 2013-04-17: qty 1

## 2013-04-17 MED ORDER — MENTHOL 3 MG MT LOZG: 1.0000 | LOZENGE | OROMUCOSAL | Status: DC | PRN

## 2013-04-17 MED ORDER — PROMETHAZINE HCL 25 MG/ML IJ SOLN: 6.2500 mg | INTRAMUSCULAR | Status: DC | PRN

## 2013-04-17 MED ORDER — LACTATED RINGERS IV SOLN
INTRAVENOUS | Status: DC
  Administered 2013-04-17: 20:00:00 via INTRAVENOUS

## 2013-04-17 MED ORDER — PROPOFOL 10 MG/ML IV BOLUS
INTRAVENOUS | Status: AC
  Filled 2013-04-17: qty 20

## 2013-04-17 MED ORDER — FENTANYL CITRATE 0.05 MG/ML IJ SOLN
INTRAMUSCULAR | Status: AC
Start: 2013-04-17 — End: 2013-04-17
  Filled 2013-04-17: qty 5

## 2013-04-17 MED ORDER — CLINDAMYCIN PHOSPHATE 600 MG/50ML IV SOLN
600.0000 mg | Freq: Four times a day (QID) | INTRAVENOUS | Status: AC
  Administered 2013-04-17 – 2013-04-18 (×2): 600 mg via INTRAVENOUS
  Filled 2013-04-17 (×2): qty 50

## 2013-04-17 MED ORDER — HYDROCODONE-ACETAMINOPHEN 5-325 MG PO TABS: 1.0000 | ORAL_TABLET | Freq: Four times a day (QID) | ORAL | Status: DC | PRN

## 2013-04-17 MED ORDER — CISATRACURIUM BESYLATE (PF) 10 MG/5ML IV SOLN
INTRAVENOUS | Status: DC | PRN
  Administered 2013-04-17: 10 mg via INTRAVENOUS

## 2013-04-17 MED ORDER — NEOSTIGMINE METHYLSULFATE 1 MG/ML IJ SOLN
INTRAMUSCULAR | Status: DC | PRN
  Administered 2013-04-17: 4 mg via INTRAVENOUS

## 2013-04-17 MED ORDER — CLOPIDOGREL BISULFATE 75 MG PO TABS
75.0000 mg | ORAL_TABLET | Freq: Every day | ORAL | Status: DC
  Administered 2013-04-18 – 2013-04-24 (×6): 75 mg via ORAL
  Filled 2013-04-17 (×8): qty 1

## 2013-04-17 MED ORDER — MEPERIDINE HCL 50 MG/ML IJ SOLN
6.2500 mg | INTRAMUSCULAR | Status: DC | PRN
  Administered 2013-04-17 (×2): 12.5 mg via INTRAVENOUS

## 2013-04-17 MED ORDER — ONDANSETRON HCL 4 MG/2ML IJ SOLN
INTRAMUSCULAR | Status: AC
  Filled 2013-04-17: qty 2

## 2013-04-17 MED ORDER — FERROUS SULFATE 325 (65 FE) MG PO TABS
325.0000 mg | ORAL_TABLET | Freq: Three times a day (TID) | ORAL | Status: DC
Start: 2013-04-18 — End: 2013-04-24
  Administered 2013-04-18 – 2013-04-24 (×17): 325 mg via ORAL
  Filled 2013-04-17 (×22): qty 1

## 2013-04-17 MED ORDER — CLINDAMYCIN PHOSPHATE 900 MG/50ML IV SOLN
900.0000 mg | INTRAVENOUS | Status: AC
  Administered 2013-04-17: 900 mg via INTRAVENOUS
  Filled 2013-04-17: qty 50

## 2013-04-17 SURGICAL SUPPLY — 47 items
BAG ZIPLOCK 12X15 (MISCELLANEOUS) ×3 IMPLANT
BLADE SAW SGTL 18X1.27X75 (BLADE) ×2 IMPLANT
BLADE SAW SGTL 18X1.27X75MM (BLADE) ×1
CAPT HIP HD POR BIPOL/UNIPOL ×3 IMPLANT
CLOSURE WOUND 1/2 X4 (GAUZE/BANDAGES/DRESSINGS) ×2
DERMABOND ADVANCED (GAUZE/BANDAGES/DRESSINGS) ×2
DERMABOND ADVANCED .7 DNX12 (GAUZE/BANDAGES/DRESSINGS) ×1 IMPLANT
DRAPE INCISE IOBAN 85X60 (DRAPES) ×3 IMPLANT
DRAPE ORTHO SPLIT 77X108 STRL (DRAPES) ×4
DRAPE POUCH INSTRU U-SHP 10X18 (DRAPES) ×3 IMPLANT
DRAPE SURG 17X11 SM STRL (DRAPES) ×3 IMPLANT
DRAPE SURG ORHT 6 SPLT 77X108 (DRAPES) ×2 IMPLANT
DRAPE U-SHAPE 47X51 STRL (DRAPES) ×3 IMPLANT
DRSG AQUACEL AG ADV 3.5X10 (GAUZE/BANDAGES/DRESSINGS) ×3 IMPLANT
DRSG TEGADERM 4X4.75 (GAUZE/BANDAGES/DRESSINGS) ×3 IMPLANT
DURAPREP 26ML APPLICATOR (WOUND CARE) ×3 IMPLANT
ELECT BLADE TIP CTD 4 INCH (ELECTRODE) ×3 IMPLANT
ELECT REM PT RETURN 9FT ADLT (ELECTROSURGICAL) ×3
ELECTRODE REM PT RTRN 9FT ADLT (ELECTROSURGICAL) ×1 IMPLANT
EVACUATOR 1/8 PVC DRAIN (DRAIN) ×3 IMPLANT
FACESHIELD LNG OPTICON STERILE (SAFETY) ×12 IMPLANT
GAUZE SPONGE 2X2 8PLY STRL LF (GAUZE/BANDAGES/DRESSINGS) ×1 IMPLANT
GLOVE BIOGEL PI IND STRL 7.5 (GLOVE) ×1 IMPLANT
GLOVE BIOGEL PI IND STRL 8 (GLOVE) ×1 IMPLANT
GLOVE BIOGEL PI INDICATOR 7.5 (GLOVE) ×2
GLOVE BIOGEL PI INDICATOR 8 (GLOVE) ×2
GLOVE ECLIPSE 8.0 STRL XLNG CF (GLOVE) IMPLANT
GLOVE ORTHO TXT STRL SZ7.5 (GLOVE) ×6 IMPLANT
GLOVE SURG ORTHO 8.0 STRL STRW (GLOVE) ×3 IMPLANT
GOWN SPEC L3 XXLG W/TWL (GOWN DISPOSABLE) ×6 IMPLANT
GOWN STRL REUS W/TWL LRG LVL3 (GOWN DISPOSABLE) ×3 IMPLANT
HANDPIECE INTERPULSE COAX TIP (DISPOSABLE)
IMMOBILIZER KNEE 20 (SOFTGOODS) IMPLANT
KIT BASIN OR (CUSTOM PROCEDURE TRAY) ×3 IMPLANT
MANIFOLD NEPTUNE II (INSTRUMENTS) ×3 IMPLANT
PACK TOTAL JOINT (CUSTOM PROCEDURE TRAY) ×3 IMPLANT
POSITIONER SURGICAL ARM (MISCELLANEOUS) ×3 IMPLANT
SET HNDPC FAN SPRY TIP SCT (DISPOSABLE) IMPLANT
SPONGE GAUZE 2X2 STER 10/PKG (GAUZE/BANDAGES/DRESSINGS) ×2
STRIP CLOSURE SKIN 1/2X4 (GAUZE/BANDAGES/DRESSINGS) ×4 IMPLANT
SUT ETHIBOND NAB CT1 #1 30IN (SUTURE) ×3 IMPLANT
SUT MNCRL AB 4-0 PS2 18 (SUTURE) ×3 IMPLANT
SUT VIC AB 1 CT1 36 (SUTURE) ×6 IMPLANT
SUT VIC AB 2-0 CT1 27 (SUTURE) ×4
SUT VIC AB 2-0 CT1 TAPERPNT 27 (SUTURE) ×2 IMPLANT
TOWEL OR 17X26 10 PK STRL BLUE (TOWEL DISPOSABLE) ×6 IMPLANT
TRAY FOLEY CATH 14FRSI W/METER (CATHETERS) ×3 IMPLANT

## 2013-04-17 NOTE — Anesthesia Preprocedure Evaluation (Addendum)
Anesthesia Evaluation  Patient identified by MRN, date of birth, ID band Patient awake    Reviewed: Allergy & Precautions, H&P , NPO status , Patient's Chart, lab work & pertinent test results  Airway Mallampati: II TM Distance: >3 FB Neck ROM: Full    Dental no notable dental hx.    Pulmonary neg pulmonary ROS, former smoker,  breath sounds clear to auscultation  Pulmonary exam normal       Cardiovascular negative cardio ROS  Rhythm:Regular Rate:Normal     Neuro/Psych Seizures -, Well Controlled,  CVA, Residual Symptoms negative psych ROS   GI/Hepatic negative GI ROS, Neg liver ROS,   Endo/Other  negative endocrine ROS  Renal/GU negative Renal ROS  negative genitourinary   Musculoskeletal negative musculoskeletal ROS (+)   Abdominal   Peds negative pediatric ROS (+)  Hematology negative hematology ROS (+)   Anesthesia Other Findings   Reproductive/Obstetrics negative OB ROS                          Anesthesia Physical Anesthesia Plan  ASA: III  Anesthesia Plan: General   Post-op Pain Management:    Induction: Intravenous  Airway Management Planned: Oral ETT  Additional Equipment:   Intra-op Plan:   Post-operative Plan: Extubation in OR  Informed Consent: I have reviewed the patients History and Physical, chart, labs and discussed the procedure including the risks, benefits and alternatives for the proposed anesthesia with the patient or authorized representative who has indicated his/her understanding and acceptance.   Dental advisory given  Plan Discussed with: CRNA and Surgeon  Anesthesia Plan Comments: (Recent CVA no sux)       Anesthesia Quick Evaluation

## 2013-04-17 NOTE — Care Management Note (Signed)
    Page 1 of 2   04/23/2013     4:02:18 PM   CARE MANAGEMENT NOTE 04/23/2013  Patient:  Rodney Potts, Rodney Potts   Account Number:  [id number]  Date Initiated:  04/17/2013  Documentation initiated by:  Dessa Phi  Subjective/Objective Assessment:   68 Y/O M ADMITTED W/FALL.[place].     Action/Plan:   FROM HOME W/SPOUSE.HAS PCP,PHARMACY.   Anticipated DC Date:  04/25/2013   Anticipated DC Plan:  SKILLED NURSING FACILITY  In-house referral  Clinical Social Worker      DC Planning Services  CM consult      Choice offered to / List presented to:             Status of service:  In process, will continue to follow Medicare Important Message given?   (If response is "NO", the following Medicare IM given date fields will be blank) Date Medicare IM given:   Date Additional Medicare IM given:    Discharge Disposition:    Per UR Regulation:  Reviewed for med. necessity/level of care/duration of stay  If discussed at [place] of Stay Meetings, dates discussed:   04/21/2013    Comments:  04/23/13- 1500- Marvetta Gibbons RN, BSN [phone] Pt with stroke like symptoms- tx to [facility] from [facility] for stroke work- up- neuro following- MRI/MRA pending- CSW following for SNF placement as pt was denied CIR- spoke with Genie from [facility] and per conversation if pt is + on MRI for stroke then could resubmit to insurance to see if they would approve for CIR with new acute CVA- if work up negative then would need to continue with plan for SNF- spoke with wife and pt in room to explain process and that we would f/u along with work up and recontact CIR if workup should show anything acute with neuro- if not then would move forward with plan for SNF when pt cleared medically stable for discharge. CSW to bring list of bed offers for SNF to pt and wife.  04/22/13 [place] RN,BSN NCM [id number] DENIED INPT REHAB.CODE STROKE.CONTINUE TO MONITOR PROGRESS.  04/21/13 KATHY MAHABIR RN,BSN NCM [phone] POD#4,CIR-EVAL-RECOMMENDED FOR CIR.GENIE CIR REHAB COORDIN FOLLOWING.NSG TO MANAGE TRANSFER VIA [facility] IF APPROVED.[facility] TEL#[phone].  04/20/13 KATHY MAHABIR RN,BSN NCM [id number] POD#3 L HIP HEMI.PART1 CIR EVAL.CIR REHAB COORDIN FOLLOWING FOR INSURANCE AUTH.AWAIT RECOMMENDATIONS.SW FOLLOWING FOR SNF.  04/17/13 KATHY MAHABIR RN,BSN NCM [phone] ORTHO CONS FOR L HIP HEMI.

## 2013-04-17 NOTE — Progress Notes (Signed)
   Subjective: Left subcapital femoral neck fracture  Patient reports pain as moderate. Patient suffered from a fall at home, approxmately 6pm this evening. He was changing clothes when he slipped and fell directly on his left hip. He denied any other injury's. He immediately had pain in the left hip. He denied SOB, CP, or change in LOC. He does have a history of TIA's and is on Plavix.  X-rays reveal a left subcapital femoral neck fracture. We have discussed as well as risks, benefits and expectations.  Risks including but not limited to the risk of anesthesia, blood clots, nerve damage, blood vessel damage, failure of the prosthesis, infection and up to and including death.  Patient understand the risks, benefits and expectations and wishes to proceed with surgery.    Objective:   VITALS:   Filed Vitals:   04/17/13 0416  BP: 117/69  Pulse: 65  Temp: 98.4 F (36.9 C)  Resp: 16    Neurovascular intact Dorsiflexion/Plantar flexion intact No cellulitis present Compartment soft  LABS  Recent Labs  04/16/13 2023 04/17/13 0649  HGB 13.0 13.6  HCT 40.5 40.8  WBC 11.4* 9.1  PLT 237 246     Recent Labs  04/16/13 2023 04/17/13 0649  NA 135* 139  K 4.2 4.6  BUN 17 17  CREATININE 1.14 1.01  GLUCOSE 101* 82     Assessment/Plan: Left subcapital femoral neck fracture   NPO now. Will obtain consent for a left hip hemiarthroplasty today per Dr. Alvan Dame. Risks, benefits and expectations were discussed with the patient.  Risks including but not limited to the risk of anesthesia, blood clots, nerve damage, blood vessel damage, failure of the prosthesis, infection and up to and including death.  Patient understand the risks, benefits and expectations and wishes to proceed with surgery.       [place] P[place] Lad   PAC  04/17/2013, 9:53 AM

## 2013-04-17 NOTE — Progress Notes (Signed)
Upon admission to the floor, pt had small amount of blood around his penis. Penis cleansed at this time and condom catheter placed on pt. MD notified about blood around penis. Orders given to attain a UA. UA attained without problem. No distress noted in pt. Will continue to monitor pt.  Rodney Potts I

## 2013-04-17 NOTE — Progress Notes (Signed)
CSW to follow for possible rehab needs following surgery and physical therapy evaluation.  Lizzete Gough C. Silvana MSW, [place]

## 2013-04-17 NOTE — Anesthesia Postprocedure Evaluation (Signed)
  Anesthesia Post-op Note  Patient: Rodney Potts  Procedure(s) Performed: Procedure(s) (LRB): ARTHROPLASTY BIPOLAR HIP (Left)  Patient Location: PACU  Anesthesia Type: General  Level of Consciousness: awake and alert   Airway and Oxygen Therapy: Patient Spontanous Breathing  Post-op Pain: mild  Post-op Assessment: Post-op Vital signs reviewed, Patient's Cardiovascular Status Stable, Respiratory Function Stable, Patent Airway and No signs of Nausea or vomiting  Last Vitals:  Filed Vitals:   04/17/13 1945  BP: 102/70  Pulse: 85  Temp:   Resp: 10    Post-op Vital Signs: stable   Complications: No apparent anesthesia complications

## 2013-04-17 NOTE — Progress Notes (Signed)
[facility] PROGRESS NOTE  Eufemio Strahm Cowans OZD:[id number] DOB: 1945-08-15 DOA: 04/16/2013 PCP: Woody Seller, MD  Assessment/Plan: 1. Left femoral neck fracture, status post mechanical fall at home. Patient does not appear to have active cardiopulmonary issues, reports being active at baseline, denies chest pain with exertion. Orthopedic surgery consulted, plan and or ORIF later today. Physical therapy occupational therapy consult tomorrow. Plavix has been discontinued. 2. History of seizure disorder. He denies recent seizure activity, will continue Keppra 750 mg by mouth twice a day and phenobarbital 97 mg each bedtime. 3. History of CVA. Plavix being held for surgical intervention by orthopedic surgery. 4. Dyslipidemia. Continue atorvastatin 40 mg by mouth daily  Code Status: Full Code Family Communication: I spoke with patient's wife at bedside Disposition Plan: Plan for ORIF today.    Consultants:  Orthopedic surgery   HPI/Subjective: Patient is a pleasant 68 year old gentleman with a past medical history of CVA, currently on Plavix therapy, history of seizure disorder, dyslipidemia who was in his usual state of health when on the evening of 04/16/2013 had a fall after losing his balance while changing clothes. X-ray showed subcapital left femoral neck fracture. Orthopedic surgery consulted, plan for ORIF today. Patient complains of ongoing left hip pain, requesting IV morphine.  Objective: Filed Vitals:   04/17/13 0416  BP: 117/69  Pulse: 65  Temp: 98.4 F (36.9 C)  Resp: 16    Intake/Output Summary (Last 24 hours) at 04/17/13 1003 Last data filed at 04/17/13 0600  Gross per 24 hour  Intake 393.33 ml  Output    500 ml  Net -106.67 ml   Filed Weights   04/16/13 2301  Weight: 81.012 kg (178 lb 9.6 oz)    Exam:   General:  Patient reporting left hip pain, mild distress.  Cardiovascular: Regular rate rhythm normal S1-S2 no murmurs or gallop  Respiratory:  Clear to auscultation bilaterally  Abdomen: Soft nontender nondistended  Musculoskeletal: Traction place 2 left lower extremity, patient reporting severe pain to left hip with movement.  Data Reviewed: Basic Metabolic Panel:  Recent Labs Lab 04/16/13 2023 04/17/13 0649  NA 135* 139  K 4.2 4.6  CL 96 99  CO2 28 27  GLUCOSE 101* 82  BUN 17 17  CREATININE 1.14 1.01  CALCIUM 9.5 9.3   Liver Function Tests:  Recent Labs Lab 04/17/13 0649  AST 28  ALT 16  ALKPHOS 65  BILITOT 0.5  PROT 7.1  ALBUMIN 3.9   No results found for this basename: LIPASE, AMYLASE,  in the last 168 hours No results found for this basename: AMMONIA,  in the last 168 hours CBC:  Recent Labs Lab 04/16/13 2023 04/17/13 0649  WBC 11.4* 9.1  NEUTROABS 9.7* 7.2  HGB 13.0 13.6  HCT 40.5 40.8  MCV 88.4 88.3  PLT 237 246   Cardiac Enzymes: No results found for this basename: CKTOTAL, CKMB, CKMBINDEX, TROPONINI,  in the last 168 hours BNP (last 3 results) No results found for this basename: PROBNP,  in the last 8760 hours CBG: No results found for this basename: GLUCAP,  in the last 168 hours  No results found for this or any previous visit (from the past 240 hour(s)).   Studies: Dg Chest 1 View  04/16/2013   CLINICAL DATA:  Fall.  Hip pain.  EXAM: CHEST - 1 VIEW  COMPARISON:  01/15/2013  FINDINGS: Cardiac silhouette is normal in size. Aorta is mildly uncoiled. No mediastinal or hilar masses.  Clear lungs.  The  bony thorax is intact.  IMPRESSION: No active disease.   Electronically Signed   By: Lajean Manes M.D.   On: 04/16/2013 20:02   Dg Femur Left  04/16/2013   CLINICAL DATA:  Recent traumatic injury with pain  EXAM: LEFT FEMUR - 2 VIEW  COMPARISON:  None.  FINDINGS: There is a subcapital femoral neck fracture identified with mild impaction at the fracture site. No other acute abnormality is noted. A prior left knee prosthesis is seen.  IMPRESSION: Subcapital left femoral neck fracture.    Electronically Signed   By: Inez Catalina M.D.   On: 04/16/2013 19:59    Scheduled Meds: . sodium chloride   Intravenous STAT  . atorvastatin  40 mg Oral QHS  . donepezil  10 mg Oral QHS  . enoxaparin (LOVENOX) injection  40 mg Subcutaneous QHS  . fesoterodine  8 mg Oral Daily  . fluticasone  2 spray Each Nare Daily  . levETIRAcetam  750 mg Oral Q12H  . loratadine  10 mg Oral Daily  . ondansetron (ZOFRAN) IV  4 mg Intravenous Once  . PHENobarbital  97.2 mg Oral QHS   Continuous Infusions: . sodium chloride      Principal Problem:   Femur fracture, left Active Problems:   CVA (cerebral infarction)   Seizures   Glioma of brain   Difficulty walking   Femur fracture    Time spent: 35 minutes    [name]  [facility] Pager [phone]. If 7PM-7AM, please contact night-coverage at [email or web address], password [facility] 04/17/2013, 10:03 AM  LOS: 1 day

## 2013-04-17 NOTE — Transfer of Care (Signed)
Immediate Anesthesia Transfer of Care Note  Patient: Rodney Potts  Procedure(s) Performed: Procedure(s): ARTHROPLASTY BIPOLAR HIP (Left)  Patient Location: PACU  Anesthesia Type:General  Level of Consciousness: awake, sedated and patient cooperative  Airway & Oxygen Therapy: Patient Spontanous Breathing and Patient connected to face mask oxygen  Post-op Assessment: Report given to PACU RN and Post -op Vital signs reviewed and stable  Post vital signs: Reviewed and stable  Complications: No apparent anesthesia complications

## 2013-04-17 NOTE — H&P (View-Only) (Signed)
   Subjective: Left subcapital femoral neck fracture  Patient reports pain as moderate. Patient suffered from a fall at home, approxmately 6pm this evening. He was changing clothes when he slipped and fell directly on his left hip. He denied any other injury's. He immediately had pain in the left hip. He denied SOB, CP, or change in LOC. He does have a history of TIA's and is on Plavix.  X-rays reveal a left subcapital femoral neck fracture. We have discussed as well as risks, benefits and expectations.  Risks including but not limited to the risk of anesthesia, blood clots, nerve damage, blood vessel damage, failure of the prosthesis, infection and up to and including death.  Patient understand the risks, benefits and expectations and wishes to proceed with surgery.    Objective:   VITALS:   Filed Vitals:   04/17/13 0416  BP: 117/69  Pulse: 65  Temp: 98.4 F (36.9 C)  Resp: 16    Neurovascular intact Dorsiflexion/Plantar flexion intact No cellulitis present Compartment soft  LABS  Recent Labs  04/16/13 2023 04/17/13 0649  HGB 13.0 13.6  HCT 40.5 40.8  WBC 11.4* 9.1  PLT 237 246     Recent Labs  04/16/13 2023 04/17/13 0649  NA 135* 139  K 4.2 4.6  BUN 17 17  CREATININE 1.14 1.01  GLUCOSE 101* 82     Assessment/Plan: Left subcapital femoral neck fracture   NPO now. Will obtain consent for a left hip hemiarthroplasty today per Dr. Olin. Risks, benefits and expectations were discussed with the patient.  Risks including but not limited to the risk of anesthesia, blood clots, nerve damage, blood vessel damage, failure of the prosthesis, infection and up to and including death.  Patient understand the risks, benefits and expectations and wishes to proceed with surgery.       Oliana Gowens S. Kenyatte Chatmon   PAC  04/17/2013, 9:53 AM   

## 2013-04-17 NOTE — Op Note (Signed)
NAME:  Rodney Potts, [place] NO.:  [id number]   MEDICAL RECORD NO.: [id number]   LOCATION:  7782                         FACILITY:  [facility] BIRTH:  07/10/2045  PHYSICIAN:  Pietro Cassis. Alvan Dame, M.D.     DATE OF PROCEDURE:  04/17/13                               OPERATIVE REPORT     PREOPERATIVE DIAGNOSIS:  Left displaced femoral neck fracture.   POSTOPERATIVE DIAGNOSIS:  Left displaced femoral neck fracture.   PROCEDURE:  Left hip hemiarthroplasty utilizing DePuy component, size 8 standard Tri-Lock stem with a 83mm unipolar ball with a +5 adapter.   SURGEON:  Pietro Cassis. Alvan Dame, MD   ASSISTANT:  Danae Orleans, PA-C.   ANESTHESIA:  General.   SPECIMENS:  None.   DRAINS:  None.   BLOOD LOSS:  About 200 cc.   COMPLICATIONS:  None.   INDICATION OF PROCEDURE:  Mr. Delford Field is a pleasant 68 year old male who lives Independently with his wife.  He unfortunately had a fall at their house while to get dressed last night.  He has a history of stroke effecting his left lower extremity with resultant hemiparesis.  He was admitted to the hospital after radiographs revealed a femoral neck fracture.  He was seen and evaluated and was scheduled for surgery for fixation.  The necessity of surgical repair was discussed with he and his family.  He has a history of bilateral total knee replacements by my partner, their doctor, Erasmo Score. Consent was obtained after reviewing risks of infection, DVT, component failure, and need for revision surgery.   PROCEDURE IN DETAIL:  The patient was brought to the operative theater. Once adequate anesthesia, preoperative antibiotics, 2 g of Ancef administered, the patient was positioned into the right lateral decubitus position with the left side up.  The left lower extremity was then prepped and draped in sterile fashion.  A time-out was performed identifying the patient, planned procedure, and extremity.   A lateral incision was made  off the proximal trochanter. Sharp dissection was carried down to the iliotibial band and gluteal fascia. The gluteal fascia was then incised for posterior approach.  The short external rotators were taken down separate from the posterior capsule. An L capsulotomy was made preserving the posterior leaflet for later anatomic repair. Fracture site was identified and after removing comminuted segments of the posterior femoral neck, the femoral head was removed without difficulty and measured on the back table  using the sizing rings and determined to be 53 mm in diameter.   The proximal femur was then exposed.  Retractors placed.  I then drilled, opened the proximal femur.  Then I hand reamed once and  Irrigated the canal to try to prevent fat emboli.  I began broaching the femur with a #0 broach up to a size 8 broach with good medial and lateral metaphyseal fit without evidence of any torsion or movement.  A trial reduction was carried out with a standard neck and a +5 adapter with a 53 ball.  The hip reduced nicely.  The leg lengths appeared to be equal compared to the down leg.   The hip went through a  range of motion without evidence of any subluxation or impingement.   Given these findings, the trial components removed.  The final 8 standard  Tri-Lock stem was opened.  After irrigating the canal, the final stem was impacted and sat at the level where the broach was. Based on this and the trial reduction, a +5 adapter was opened and impacted in the 6mm unipolar ball onto a clean and dry trunnion.  The hip had been irrigated throughout the case and again at this point.  I re- Approximated the posterior capsule to the superior leaflet using a  #1 Vicryl.  The remainder of the wound was closed with #1 Vicryl and #0 V-lock sutures in the iliotibial band and gluteal fascia, a  2-0 Vicryl in the sub-Q tissue and a running 4-0 Monocryl in the skin.  The hip was cleaned, dried, and dressed  sterilely using Dermabond and Aquacel dressing.  Drain site was dressed separately.  She was then brought to recovery room, extubated in stable condition, tolerating the procedure well.  Danae Orleans, PA-C was present and utilized as Environmental consultant for the entire case from  Preoperative positioning to management of the contralateral extremity and retractors to  General facilitation of the procedure.  He was also involved with primary wound closure.         Pietro Cassis Alvan Dame, M.D.

## 2013-04-17 NOTE — Interval H&P Note (Signed)
History and Physical Interval Note:  04/17/2013 3:06 PM  Rodney Potts  has presented today for surgery, with the diagnosis of left femerol hip fracture  The various methods of treatment have been discussed with the patient and family. After consideration of risks, benefits and other options for treatment, the patient has consented to  Procedure(s): ARTHROPLASTY BIPOLAR HIP (Left) as a surgical intervention .  The patient's history has been reviewed, patient examined, no change in status, stable for surgery.  I have reviewed the patient's chart and labs.  Questions were answered to the patient's satisfaction.     Mauri Pole

## 2013-04-17 NOTE — H&P (Signed)
[facility] History and Physical  Patient: Rodney Potts  [id number]  DOB: 1945-04-02  DOS: the patient was seen and examined on 04/16/2013 PCP: Woody Seller, MD  Chief Complaint: Fall  HPI: Rodney Potts is a 68 y.o. 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 The patient is coming from home. The patient presents with a mechanical fall. He mentions he was at home and changing his plans at which time he slipped on his floor lost his balance and hit the ground with left-sided. He had severe pain and call EMS. he denies any incontinence, dizziness lightheadedness, syncope, chest pain, palpitation at the time of the event. Pt denies any fever, chills, headache, cough, chest pain, palpitation, shortness of breath, orthopnea, PND, nausea, vomiting, abdominal pain, diarrhea, constipation, active bleeding, burning urination, dizziness, pedal edema,  focal neurological deficit.  Patient recently has been admitted for TIA and is still undergoing physical therapy at home. He is compliant with his medication and there is no recent change in his medications.  Review of Systems: as mentioned in the history of present illness.  A Comprehensive review of the other systems is negative.  Past Medical History  Diagnosis Date  . Brain tumor     glioma; S/P radiation and chemotherapy (01/14/2013)  . TIA (transient ischemic attack) ~ 2011  . IBS (irritable bowel syndrome)   . Shingles   . CVA (cerebral infarction)     R thalamic  . Allergy   . Complication of anesthesia     "after knee OR woke up very wild; pulling things out, etc" (01/14/2013)  . High cholesterol   . Sleep apnea     "corrected w/OR" (01/14/2013)  . Seizures     "hx from the scar tissue where brain tumor was; on RX to control" (01/14/2013)  . Stroke     "he's had a couple; still a little weaker on the left leg,  drags that foot" (01/14/2013)  . Prostate cancer    Past Surgical History  Procedure Laterality Date  . Cataract extraction w/ intraocular lens implant Bilateral 2000's    "don't know if they put lens in" (01/14/2013)  . Joint replacement    . Prostatectomy  1990's  . Uvulopalatopharyngoplasty  1980's    "for sleep apnea" (01/14/2013)  . Shoulder arthroscopy Right 2000's  . Total knee arthroplasty Bilateral 2000's   Social History:  reports that he quit smoking about 50 years ago. His smoking use included Cigarettes. He has a .48 pack-year smoking history. He has never used smokeless tobacco. He reports that he drinks alcohol. He reports that he does not use illicit drugs. Independent for most of his  ADL.  Allergies  Allergen Reactions  . Amoxicillin Diarrhea  . Dilaudid [Hydromorphone Hcl] Other (See Comments)    DELUSIONS  . Lactose Intolerance (Gi) Diarrhea  . Sulfa Antibiotics Other (See Comments)    Cant control bladder    Family History  Problem Relation Age of Onset  . Hypertension Father   . Transient ischemic attack Mother   . Cancer Mother     Prior to Admission medications   Medication Sig Start Date End Date Taking? Authorizing Provider  Ascorbic Acid (VITAMIN C PO) Take 1 tablet by mouth daily.    Yes Historical Provider, MD  atorvastatin (LIPITOR) 40 MG tablet Take 1 tablet (40 mg total) by mouth at bedtime. 01/28/13  Yes Quillian Quince  J Angiulli, PA-C  CALCIUM PO Take 1 tablet by mouth daily.   Yes Historical Provider, MD  cetirizine (ZYRTEC) 10 MG tablet Take 10 mg by mouth daily.   Yes Historical Provider, MD  cholecalciferol (VITAMIN D) 1000 UNITS tablet Take 1,000 Units by mouth daily.   Yes Historical Provider, MD  clopidogrel (PLAVIX) 75 MG tablet Take 1 tablet (75 mg total) by mouth at bedtime. 01/28/13  Yes Daniel J Angiulli, PA-C  donepezil (ARICEPT) 10 MG tablet Take 1 tablet (10 mg total) by mouth at bedtime. 04/14/13  Yes Garvin Fila, MD  fesoterodine  (TOVIAZ) 8 MG TB24 tablet Take 1 tablet (8 mg total) by mouth daily. 01/28/13  Yes Daniel J Angiulli, PA-C  fluticasone (FLONASE) 50 MCG/ACT nasal spray Place 2 sprays into both nostrils daily.    Yes Historical Provider, MD  levETIRAcetam (KEPPRA) 750 MG tablet Take 1 tablet (750 mg total) by mouth every 12 (twelve) hours. 01/28/13  Yes Daniel J Angiulli, PA-C  Omega-3 Fatty Acids (FISH OIL PO) Take 1 capsule by mouth daily.   Yes Historical Provider, MD  PHENobarbital (LUMINAL) 97.2 MG tablet Take 1 tablet (97.2 mg total) by mouth at bedtime. 01/28/13  Yes Lavon Paganini Angiulli, PA-C  Specialty Vitamins Products (MAGNESIUM, AMINO ACID CHELATE,) 133 MG tablet Take 1 tablet by mouth at bedtime.   Yes Historical Provider, MD  VITAMIN E PO Take 1 tablet by mouth daily.   Yes Historical Provider, MD    Physical Exam: Filed Vitals:   04/16/13 1924 04/16/13 2220 04/16/13 2301 04/17/13 0207  BP: 164/97 160/85 133/80 122/75  Pulse: 73 78 102 80  Temp: 98.4 F (36.9 C)  98.4 F (36.9 C) 97.9 F (36.6 C)  TempSrc: Oral  Oral Oral  Resp: 18 18 18 16   Height:   5' 9.5" (1.765 m)   Weight:   81.012 kg (178 lb 9.6 oz)   SpO2: 95% 100% 97% 99%    General: Alert, Awake and Oriented to Time, Place and Person. Appear in mild distress Eyes: PERRL ENT: Oral Mucosa clear moist. Neck: no JVD Cardiovascular: S1 and S2 Present, no Murmur, Peripheral Pulses Present Respiratory: Bilateral Air entry equal and Decreased, Clear to Auscultation,  no Crackles,no wheezes Abdomen: Bowel Sound Present, Soft and Non tender Skin: no Rash Extremities: no Pedal edema, no calf tenderness, left leg pulses palpable, sensation intact, movements intact, externally rotated and tender on left groin and trochanteric region. Neurologic: Grossly Unremarkable. Labs on Admission:  CBC:  Recent Labs Lab 04/16/13 2023  WBC 11.4*  NEUTROABS 9.7*  HGB 13.0  HCT 40.5  MCV 88.4  PLT 237    CMP     Component Value Date/Time    NA 135* 04/16/2013 2023   K 4.2 04/16/2013 2023   CL 96 04/16/2013 2023   CO2 28 04/16/2013 2023   GLUCOSE 101* 04/16/2013 2023   BUN 17 04/16/2013 2023   CREATININE 1.14 04/16/2013 2023   CALCIUM 9.5 04/16/2013 2023   PROT 6.2 02/19/2013 0600   ALBUMIN 3.3* 02/19/2013 0600   AST 17 02/19/2013 0600   ALT 13 02/19/2013 0600   ALKPHOS 52 02/19/2013 0600   BILITOT 0.2* 02/19/2013 0600   GFRNONAA 65* 04/16/2013 2023   GFRAA 76* 04/16/2013 2023    Radiological Exams on Admission: Dg Chest 1 View  04/16/2013   CLINICAL DATA:  Fall.  Hip pain.  EXAM: CHEST - 1 VIEW  COMPARISON:  01/15/2013  FINDINGS: Cardiac silhouette is normal  in size. Aorta is mildly uncoiled. No mediastinal or hilar masses.  Clear lungs.  The bony thorax is intact.  IMPRESSION: No active disease.   Electronically Signed   By: Lajean Manes M.D.   On: 04/16/2013 20:02   Dg Femur Left  04/16/2013   CLINICAL DATA:  Recent traumatic injury with pain  EXAM: LEFT FEMUR - 2 VIEW  COMPARISON:  None.  FINDINGS: There is a subcapital femoral neck fracture identified with mild impaction at the fracture site. No other acute abnormality is noted. A prior left knee prosthesis is seen.  IMPRESSION: Subcapital left femoral neck fracture.   Electronically Signed   By: Inez Catalina M.D.   On: 04/16/2013 19:59    Assessment/Plan Principal Problem:   Femur fracture, left Active Problems:   CVA (cerebral infarction)   Seizures   Glioma of brain   Difficulty walking   Femur fracture   1. Femur fracture, left The patient is presenting with left femur fracture. He has baseline ataxia and recently admitted for TIA and still undergoing physical therapy for the same. Today he had a mechanical fall and now he has the fracture of the femur. Orthopedic has been consulted who will be performing procedures in the morning I will keep the patient n.p.o. IV fentanyl for pain control since patient is still sensitive to morphine and Dilaudid IV Zofran as  needed. Orthopedic as ordered traction for the patient.  2. History of seizure At present I would continue his Keppra and phenobarbital at home dose  3. Prior CVA recent TIA At present for the procedure we will hold Plavix for one dose should be resumed postoperatively per orthopedic  Consults:  orthopedics  DVT Prophylaxis: subcutaneous Heparin Nutrition: npo  Code Status: full  Family Communication: wife was present at bedside, opportunity was given to ask question and all questions were answered satisfactorily at the time of interview. Disposition: Admitted to inpatient in telemetry unit.  Author: Berle Mull, MD [facility] Pager: [phone]  If 7PM-7AM, please contact night-coverage [email or web address] Password [email or web address]

## 2013-04-18 LAB — CBC
HEMATOCRIT: 32.4 % — AB (ref 39.0–52.0)
HEMOGLOBIN: 10.2 g/dL — AB (ref 13.0–17.0)
MCH: 28 pg (ref 26.0–34.0)
MCHC: 31.5 g/dL (ref 30.0–36.0)
MCV: 89 fL (ref 78.0–100.0)
Platelets: 187 10*3/uL (ref 150–400)
RBC: 3.64 MIL/uL — AB (ref 4.22–5.81)
RDW: 15.4 % (ref 11.5–15.5)
WBC: 8.8 10*3/uL (ref 4.0–10.5)

## 2013-04-18 LAB — BASIC METABOLIC PANEL
BUN: 16 mg/dL (ref 6–23)
CO2: 26 meq/L (ref 19–32)
Calcium: 8.4 mg/dL (ref 8.4–10.5)
Chloride: 98 mEq/L (ref 96–112)
Creatinine, Ser: 0.94 mg/dL (ref 0.50–1.35)
GFR calc Af Amer: >90 mL/min (ref >90)
GFR calc non Af Amer: 85 mL/min — ABNORMAL LOW (ref >90)
GLUCOSE: 91 mg/dL (ref 70–99)
Potassium: 4.3 mEq/L (ref 3.7–5.3)
SODIUM: 135 meq/L — AB (ref 137–147)

## 2013-04-18 NOTE — Evaluation (Signed)
Physical Therapy Evaluation Patient Details Name: Rodney Potts MRN: [id number] DOB: 1945-10-31 Today's Date: 04/18/2013 Time: 3875-6433 PT Time Calculation (min): 41 min  PT Assessment / Plan / Recommendation History of Present Illness  Rodney Potts is a 68 y.o. 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, recent TIA, prior CVA, history of seizure, prostate cancer; He fell prior to this adm resulting in hip fx and underwent  L hip hemiarthroplasty 04/17/13  Clinical Impression  Pt will benefit from PT to address deficits below; Pt with difficulty following commands, attention, etc this session; will  need additional post acute therapies; He attends adult daycare per wife and amb with a cane at baseline;  PT Assessment  Patient needs continued PT services    Follow Up Recommendations  SNF    Does the patient have the potential to tolerate intense rehabilitation      Barriers to Discharge        Equipment Recommendations  None recommended by PT    Recommendations for Other Services     Frequency Min 3X/week    Precautions / Restrictions Precautions Precautions: Fall;Posterior Hip Precaution Booklet Issued: Yes (comment) Restrictions LLE Weight Bearing: Weight bearing as tolerated   Pertinent Vitals/Pain C/o left hip pain with movement, RN aware      Mobility  Bed Mobility Overal bed mobility: +2 for physical assistance;Needs Assistance Bed Mobility: Supine to Sit Supine to sit: +2 for physical assistance;+2 for safety/equipment;Max assist Sit to supine: Total assist;+2 for physical assistance General bed mobility comments: multi-modal cues for sequencing and facilitation of safe techniques Transfers Overall transfer level: Needs assistance Equipment used: Rolling walker (2 wheeled);None Transfers: Sit to/from Stand Sit to Stand: +2 physical assistance;Total assist General transfer comment: attempted x2; pt unable to  come to full extension and WB thru LEs    Exercises General Exercises - Lower Extremity Heel Slides: AAROM;Both;5 reps   PT Diagnosis: Difficulty walking;Generalized weakness  PT Problem List: Decreased strength;Decreased range of motion;Decreased activity tolerance;Decreased balance;Decreased mobility;Decreased knowledge of precautions;Decreased knowledge of use of DME;Decreased safety awareness;Decreased cognition PT Treatment Interventions: DME instruction;Gait training;Functional mobility training;Therapeutic activities;Therapeutic exercise;Patient/family education;Balance training     PT Goals(Current goals can be found in the care plan section) Acute Rehab PT Goals Patient Stated Goal: to go home PT Goal Formulation: Patient unable to participate in goal setting Time For Goal Achievement: 05/02/13 Potential to Achieve Goals: Good  Visit Information  Last PT Received On: 04/18/13 Assistance Needed: +2 History of Present Illness: Rodney Potts is a 68 y.o. 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, recent TIA, prior CVA, history of seizure, prostate cancer; He fell prior to thsi adm resulting in hip fx and underwent  L hip hemiarthroplasty 04/17/13       Prior [place] expects to be discharged to:: Unsure Living Arrangements: Spouse/significant other Available Help at Discharge: Other (Comment);Family Home Equipment: Kasandra Knudsen - single point;Walker - 2 wheels;Bedside commode;Shower seat Additional Comments: pt goes to adult daycare center per wife Prior Function Level of Independence: Independent with assistive device(s) Comments: used cane prior  to adm to amb Communication Communication: Other (comment) (delay ) Dominant Hand: Right    Cognition  Cognition Arousal/Alertness: Awake/alert Behavior During Therapy: Flat affect Overall Cognitive Status: Impaired/Different from baseline Area of  Impairment: Orientation;Attention;Memory;Following commands;Safety/judgement Orientation Level: Disoriented to;Time Current Attention Level: Sustained (internally distracted) Memory: Decreased recall of precautions;Decreased short-term  memory Following Commands: Follows one step commands inconsistently Safety/Judgement: Decreased awareness of safety;Decreased awareness of deficits Awareness: Intellectual Problem Solving: Slow processing;Decreased initiation;Difficulty sequencing;Requires verbal cues;Requires tactile cues General Comments: wife states pt usually has a hard time "coming out of anesthesia"    Extremity/Trunk Assessment Upper Extremity Assessment Upper Extremity Assessment: Defer to OT evaluation LUE Deficits / Details: dysmetria LUE Sensation: decreased light touch LUE Coordination: decreased fine motor;decreased gross motor Lower Extremity Assessment Lower Extremity Assessment: RLE deficits/detail;LLE deficits/detail RLE Deficits / Details: AAROM WFL, grossly 3/5 when tested in bed LLE Deficits / Details: hip fx. proprioceptive deficits.  LLE: Unable to fully assess due to pain LLE Coordination: decreased gross motor Cervical / Trunk Assessment Cervical / Trunk Assessment: Kyphotic Cervical / Trunk Exceptions: unable to achieve midline postural control. R and post bias   Balance Balance Overall balance assessment: Needs assistance Sitting-balance support: Feet supported;No upper extremity supported Sitting balance-Leahy Scale: Zero Sitting balance - Comments: post and R bias Postural control: Posterior lean;Right lateral lean Standing balance support: Bilateral upper extremity supported Standing balance-Leahy Scale:  (unable to come to stand/WB on LEs)  End of Session PT - End of Session Equipment Utilized During Treatment: Gait belt Activity Tolerance: Other (comment) (cognition) Patient left: in chair;with call bell/phone within reach;with family/visitor  present Nurse Communication: Need for lift equipment;Mobility status;Precautions  GP     [facility] 04/18/2013, 4:23 PM

## 2013-04-18 NOTE — Progress Notes (Signed)
[facility] PROGRESS NOTE  Rodney Potts QPY:[id number] DOB: 09/12/1945 DOA: 04/16/2013 PCP: Rodney Seller, MD  Assessment/Plan: 1. Left femoral neck fracture, status post mechanical fall at home. S/P ORIF, procedure performed by Dr Tonita Cong, no immediate complications. PT consulted.  2. History of seizure disorder. He denies recent seizure activity, will continue Keppra 750 mg by mouth twice a day and phenobarbital 97 mg each bedtime. 3. History of CVA. Plavix being held for surgical intervention by orthopedic surgery. 4. Dyslipidemia. Continue atorvastatin 40 mg by mouth daily  Code Status: Full Code Family Communication: I spoke with patient's wife at bedside Disposition Plan: Plan for ORIF today.    Consultants:  Orthopedic surgery   HPI/Subjective: Patient is a pleasant 68 year old gentleman with a past medical history of CVA, currently on Plavix therapy, history of seizure disorder, dyslipidemia who was in his usual state of health when on the evening of 04/16/2013 had a fall after losing his balance while changing clothes. X-ray showed subcapital left femoral neck fracture.   Patient take to OR on 2/03/08/13, undergoing ORIF. This morning he reports feeling well, states pain is controlled, no N/V or fevers.  Objective: Filed Vitals:   04/18/13 0447  BP: 101/67  Pulse: 86  Temp: 99.1 F (37.3 C)  Resp: 14    Intake/Output Summary (Last 24 hours) at 04/18/13 1307 Last data filed at 04/18/13 0940  Gross per 24 hour  Intake   3150 ml  Output   1100 ml  Net   2050 ml   Filed Weights   04/16/13 2301  Weight: 81.012 kg (178 lb 9.6 oz)    Exam:   General:  Appears well. No acute distress. .  Cardiovascular: Regular rate rhythm normal S1-S2 no murmurs or gallop  Respiratory: Clear to auscultation bilaterally  Abdomen: Soft nontender nondistended  Musculoskeletal: Surgical incision site appears clean, without evidence of hematoma or infection.   Data  Reviewed: Basic Metabolic Panel:  Recent Labs Lab 04/16/13 2023 04/17/13 0649 04/18/13 0805  NA 135* 139 135*  K 4.2 4.6 4.3  CL 96 99 98  CO2 28 27 26   GLUCOSE 101* 82 91  BUN 17 17 16   CREATININE 1.14 1.01 0.94  CALCIUM 9.5 9.3 8.4   Liver Function Tests:  Recent Labs Lab 04/17/13 0649  AST 28  ALT 16  ALKPHOS 65  BILITOT 0.5  PROT 7.1  ALBUMIN 3.9   No results found for this basename: LIPASE, AMYLASE,  in the last 168 hours No results found for this basename: AMMONIA,  in the last 168 hours CBC:  Recent Labs Lab 04/16/13 2023 04/17/13 0649 04/18/13 0805  WBC 11.4* 9.1 8.8  NEUTROABS 9.7* 7.2  --   HGB 13.0 13.6 10.2*  HCT 40.5 40.8 32.4*  MCV 88.4 88.3 89.0  PLT 237 246 187   Cardiac Enzymes: No results found for this basename: CKTOTAL, CKMB, CKMBINDEX, TROPONINI,  in the last 168 hours BNP (last 3 results) No results found for this basename: PROBNP,  in the last 8760 hours CBG: No results found for this basename: GLUCAP,  in the last 168 hours  Recent Results (from the past 240 hour(s))  URINE CULTURE     Status: None   Collection Time    04/17/13 12:05 AM      Result Value Ref Range Status   Specimen Description URINE, CLEAN CATCH   Final   Special Requests NONE   Final   Culture  Setup Time  Final   Value: 04/17/2013 09:22     Performed at [facility] Count     Final   Value: >=100,000 COLONIES/ML     Performed at [facility]   Culture     Final   Value: [place]     Performed at [facility]   Report Status PENDING   Incomplete     Studies: Dg Chest 1 View  04/16/2013   CLINICAL DATA:  Fall.  Hip pain.  EXAM: CHEST - 1 VIEW  COMPARISON:  01/15/2013  FINDINGS: Cardiac silhouette is normal in size. Aorta is mildly uncoiled. No mediastinal or hilar masses.  Clear lungs.  The bony thorax is intact.  IMPRESSION: No active disease.   Electronically Signed   By: Lajean Manes M.D.   On:  04/16/2013 20:02   Dg Femur Left  04/16/2013   CLINICAL DATA:  Recent traumatic injury with pain  EXAM: LEFT FEMUR - 2 VIEW  COMPARISON:  None.  FINDINGS: There is a subcapital femoral neck fracture identified with mild impaction at the fracture site. No other acute abnormality is noted. A prior left knee prosthesis is seen.  IMPRESSION: Subcapital left femoral neck fracture.   Electronically Signed   By: Inez Catalina M.D.   On: 04/16/2013 19:59   Dg Pelvis Portable  04/17/2013   CLINICAL DATA:  Postop left hip arthroplasty  EXAM: PORTABLE PELVIS 1-2 VIEWS  COMPARISON:  DG PELVIS 1-2 VIEWS dated 01/14/2013; DG FEMUR*L* dated 04/16/2013  FINDINGS: Post left bipolar hip arthroplasty. No fracture or dislocation. Alignment appears near anatomic. There is a very minimal amount of scattered subcutaneous emphysema about the operative site. Multiple surgical clips overlie the lower pelvis. No radiopaque foreign body.  IMPRESSION: Post left bipolar hip arthroplasty without evidence of complication.   Electronically Signed   By: Sandi Mariscal M.D.   On: 04/17/2013 19:46    Scheduled Meds: . atorvastatin  40 mg Oral QHS  . clopidogrel  75 mg Oral QAC breakfast  . docusate sodium  100 mg Oral BID  . donepezil  10 mg Oral QHS  . ferrous sulfate  325 mg Oral TID PC  . fesoterodine  8 mg Oral Daily  . fluticasone  2 spray Each Nare Daily  . levETIRAcetam  750 mg Oral Q12H  . loratadine  10 mg Oral Daily  . ondansetron (ZOFRAN) IV  4 mg Intravenous Once  . PHENobarbital  97.2 mg Oral QHS   Continuous Infusions: . sodium chloride 50 mL/hr at 04/17/13 2058    Principal Problem:   Femur fracture, left Active Problems:   CVA (cerebral infarction)   Seizures   Glioma of brain   Difficulty walking   Femur fracture    Time spent: 35 minutes    [name]  [facility] Pager [phone]. If 7PM-7AM, please contact night-coverage at [email or web address], password [facility] 04/18/2013, 1:07 PM  LOS: 2  days

## 2013-04-18 NOTE — Progress Notes (Signed)
Subjective: 1 Day Post-Op Procedure(s) (LRB): ARTHROPLASTY BIPOLAR HIP (Left) Patient reports pain as 4 on 0-10 scale.    Objective: Vital signs in last 24 hours: Temp:  [97.2 F (36.2 C)-99.4 F (37.4 C)] 99.1 F (37.3 C) (02/14 0447) Pulse Rate:  [40-119] 86 (02/14 0447) Resp:  [9-18] 14 (02/14 0447) BP: (101-155)/(57-102) 101/67 mmHg (02/14 0447) SpO2:  [89 %-100 %] 93 % (02/14 0447)  Intake/Output from previous day: 02/13 0701 - 02/14 0700 In: 2550 [I.V.:2500; IV Piggyback:50] Out: 1750 [Urine:1550; Blood:200] Intake/Output this shift:     Recent Labs  04/16/13 2023 04/17/13 0649  HGB 13.0 13.6    Recent Labs  04/16/13 2023 04/17/13 0649  WBC 11.4* 9.1  RBC 4.58 4.62  HCT 40.5 40.8  PLT 237 246    Recent Labs  04/16/13 2023 04/17/13 0649  NA 135* 139  K 4.2 4.6  CL 96 99  CO2 28 27  BUN 17 17  CREATININE 1.14 1.01  GLUCOSE 101* 82  CALCIUM 9.5 9.3    Recent Labs  04/16/13 2023  INR 1.04    Neurologically intact Sensation intact distally Dorsiflexion/Plantar flexion intact No cellulitis present Compartment soft Dressing dry  Assessment/Plan: 1 Day Post-Op Procedure(s) (LRB): ARTHROPLASTY BIPOLAR HIP (Left) Advance diet Up with therapy  Ceola Para C 04/18/2013, 8:06 AM

## 2013-04-18 NOTE — Progress Notes (Signed)
Occupational Therapy Evaluation Patient Details Name: Rodney Potts MRN: [id number] DOB: Jul 04, 1945 Today's Date: 04/18/2013 Time: 0865-7846 OT Time Calculation (min): 43 min  OT Assessment / Plan / Recommendation History of present illness   68 y.o. 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 The patient is coming from home. The patient presents with a mechanical fall. He mentions he was at home and changing his pants at which time he slipped on his floor lost his balance and hit the ground with left-sided. Underwent L hemiarthroplasty. Post hip precautions. WBAT.    Clinical Impression   PTA, pt mod I with ADL and mobility with [place]. Reports frequent falls. Pt presents with significant cognitive deficits as compared to baseline status (deficits with initiation, motor planning, judgement/insight, awareness, perseveration), unable to maintain midline postural control sitting EOB with R and post lean, difficulty with RLE proprioception, L LE proprioception, LUE dysmetria. Wife states he usually has a difficult time coming out of anesthesia, but "not this much". Attempted several times with use of lift, but unsuccessful with mobility. Information given to PT to assess use of alternative lift in effort to mobilize pt OOB. Expressed concerns over status to nsg, given pt's history of CVA and pt on Plavix when he fell. At this time, rec CIR for rehab to max level of independence. Pt will benefit from skilled OT services to facilitate D/C to next venue due to below deficits.    OT Assessment  Patient needs continued OT Services    Follow Up Recommendations  CIR;Supervision/Assistance - 24 hour    Barriers to Discharge      Equipment Recommendations  3 in 1 bedside comode;Tub/shower bench    Recommendations for Other Services Rehab consult  Frequency  Min 3X/week    Precautions /  Restrictions Precautions Precautions: Fall;Posterior Hip Precaution Booklet Issued: Yes (comment) Restrictions LLE Weight Bearing: Weight bearing as tolerated   Pertinent Vitals/Pain O2 desat to 86 RA with mobility.     ADL  Eating/Feeding: Set up;Minimal assistance Where Assessed - Eating/Feeding: Bed level Grooming: Moderate assistance Where Assessed - Grooming: Supported sitting Upper Body Bathing: Moderate assistance Where Assessed - Upper Body Bathing: Supported sitting Lower Body Bathing: +1 Total assistance Where Assessed - Lower Body Bathing: Supported sit to stand Upper Body Dressing: Maximal assistance Where Assessed - Upper Body Dressing: Supported sitting Lower Body Dressing: +1 Total assistance Where Assessed - Lower Body Dressing: Supported sit to stand Toileting - Architect and Hygiene: +1 Total assistance (foley) Equipment Used: Other (comment);Gait belt (lift equipment) Transfers/Ambulation Related to ADLs: unable to achieve upright posture with +2 total A using sara stedy. Pt appers to be pushing posteriorly and unable to shift weight anteriorly to stand. L foot moving off foot plate when trying to stand, requiring tactile cues and blocking of L knee to position properly ADL Comments: affected by cognitive status    OT Diagnosis: Generalized weakness;Cognitive deficits;Acute pain;Apraxia  OT Problem List: Decreased strength;Decreased range of motion;Decreased activity tolerance;Impaired balance (sitting and/or standing);Decreased cognition;Decreased safety awareness;Decreased knowledge of use of DME or AE;Decreased knowledge of precautions;Impaired sensation;Impaired tone;Impaired UE functional use;Pain OT Treatment Interventions: Self-care/ADL training;Therapeutic exercise;Energy conservation;DME and/or AE instruction;Therapeutic activities;Cognitive remediation/compensation;Patient/family education;Balance training   OT Goals(Current goals can be found in  the care plan section) Acute Rehab OT Goals Patient Stated Goal: to go home OT Goal Formulation: With patient/family Time For Goal Achievement: 05/02/13 Potential to  Achieve Goals: Good  Visit Information  Last OT Received On: 04/18/13 Assistance Needed: +2       Prior Functioning     Home Living Family/patient expects to be discharged to:: Inpatient rehab Prior Function Level of Independence: Independent with assistive device(s) (frequent falls) Communication Communication: Other (comment) (delay ) Dominant Hand: Right         Vision/Perception Vision - History Baseline Vision: Wears glasses all the time Vision - Assessment Eye Alignment: Within Functional Limits Perception Perception: Not tested Praxis Praxis: Impaired Praxis Impairment Details: Motor planning;Perseveration;Initiation Praxis-Other Comments: poor initiation; tactile cues for sequencing   Cognition  Cognition Arousal/Alertness: Awake/alert Behavior During Therapy: Flat affect Overall Cognitive Status: Impaired/Different from baseline Area of Impairment: Orientation;Attention;Memory;Following commands;Safety/judgement;Awareness;Problem solving Orientation Level: Disoriented to;Time Current Attention Level: Sustained (internally distracted) Memory: Decreased recall of precautions;Decreased short-term memory Following Commands: Follows one step commands inconsistently Safety/Judgement: Decreased awareness of safety;Decreased awareness of deficits Awareness: Intellectual Problem Solving: Slow processing;Decreased initiation;Difficulty sequencing;Requires verbal cues;Requires tactile cues General Comments: wife states pt usually has a hard time "coming out of anesthesia"    Extremity/Trunk Assessment Upper Extremity Assessment Upper Extremity Assessment: LUE deficits/detail LUE Deficits / Details: dysmetria LUE Sensation: decreased light touch LUE Coordination: decreased fine motor;decreased  gross motor Lower Extremity Assessment Lower Extremity Assessment: RLE deficits/detail;LLE deficits/detail RLE Deficits / Details: poor proprioceptive awrenessof RLE during mobility. Unable to fully extend RLE in standing. LLE Deficits / Details: hip fx. proprioceptive deficits.  LLE Coordination: decreased gross motor Cervical / Trunk Assessment Cervical / Trunk Assessment: Kyphotic;Other exceptions Cervical / Trunk Exceptions: unable to achieve midline postural control. R and post bias     Mobility Bed Mobility Overal bed mobility: +2 for physical assistance;Needs Assistance Bed Mobility: Supine to Sit;Sit to Supine Supine to sit: +2 for physical assistance;Total assist Sit to supine: Total assist;+2 for physical assistance General bed mobility comments: Max cues for sequencing and facilitation Transfers Overall transfer level: Needs assistance Equipment used: None (sara stedy) Transfers: Sit to/from Stand Sit to Stand: +2 physical assistance;Total assist General transfer comment: unable to achieve upright posture to safely use lift.     Exercise     Balance Balance Overall balance assessment: Needs assistance Sitting-balance support: Feet supported;Bilateral upper extremity supported Sitting balance-Leahy Scale: Zero Sitting balance - Comments: post and R bias Postural control: Posterior lean;Right lateral lean Standing balance support: Bilateral upper extremity supported Standing balance-Leahy Scale: Zero   End of Session OT - End of Session Equipment Utilized During Treatment: Gait belt Activity Tolerance: Patient tolerated treatment well Patient left: in bed;with call bell/phone within reach;with family/visitor present Nurse Communication: Mobility status;Need for lift equipment;Weight bearing status;Precautions  GO     Brodan Grewell,HILLARY 04/18/2013, 1:57 PM [facility], OTR/L  [phone] 04/18/2013

## 2013-04-19 DIAGNOSIS — R404 Transient alteration of awareness: Secondary | ICD-10-CM

## 2013-04-19 LAB — BASIC METABOLIC PANEL
BUN: 12 mg/dL (ref 6–23)
CO2: 26 mEq/L (ref 19–32)
CREATININE: 0.89 mg/dL (ref 0.50–1.35)
Calcium: 8.3 mg/dL — ABNORMAL LOW (ref 8.4–10.5)
Chloride: 100 mEq/L (ref 96–112)
GFR calc Af Amer: >90 mL/min (ref >90)
GFR, EST NON AFRICAN AMERICAN: 87 mL/min — AB (ref >90)
Glucose, Bld: 113 mg/dL — ABNORMAL HIGH (ref 70–99)
Potassium: 3.7 mEq/L (ref 3.7–5.3)
SODIUM: 137 meq/L (ref 137–147)

## 2013-04-19 LAB — URINE CULTURE: Colony Count: >=100000

## 2013-04-19 LAB — CBC
HCT: 28.7 % — ABNORMAL LOW (ref 39.0–52.0)
Hemoglobin: 9.4 g/dL — ABNORMAL LOW (ref 13.0–17.0)
MCH: 28.6 pg (ref 26.0–34.0)
MCHC: 32.8 g/dL (ref 30.0–36.0)
MCV: 87.2 fL (ref 78.0–100.0)
PLATELETS: 168 10*3/uL (ref 150–400)
RBC: 3.29 MIL/uL — ABNORMAL LOW (ref 4.22–5.81)
RDW: 15 % (ref 11.5–15.5)
WBC: 9.5 10*3/uL (ref 4.0–10.5)

## 2013-04-19 MED ORDER — ACETAMINOPHEN 325 MG PO TABS
650.0000 mg | ORAL_TABLET | Freq: Three times a day (TID) | ORAL | Status: DC
  Administered 2013-04-19 – 2013-04-24 (×14): 650 mg via ORAL
  Filled 2013-04-19 (×15): qty 2

## 2013-04-19 MED ORDER — SODIUM CHLORIDE 0.9 % IJ SOLN
3.0000 mL | Freq: Two times a day (BID) | INTRAMUSCULAR | Status: DC
  Administered 2013-04-19 – 2013-04-24 (×5): 3 mL via INTRAVENOUS

## 2013-04-19 NOTE — Progress Notes (Signed)
[facility] PROGRESS NOTE  Rodney Potts TDD:[id number] DOB: 08/27/1945 DOA: 04/16/2013 PCP: Rodney Seller, MD  Assessment/Plan: 1. Left femoral neck fracture, status post mechanical fall at home. S/P ORIF, procedure performed by Dr Rodney Potts, no immediate complications. PT consulted.  2. History of seizure disorder. He denies recent seizure activity, will continue Keppra 750 mg by mouth twice a day and phenobarbital 97 mg each bedtime. 3. Probable delirium. Patient having increased confusion, easily distractible, will minimize psychotropic medications, attempt pain control with scheduled tylenol.  4. History of CVA. Plavix being held for surgical intervention by orthopedic surgery. 5. Dyslipidemia. Continue atorvastatin 40 mg by mouth daily  Code Status: Full Code Family Communication: I spoke with patient's wife at bedside Disposition Plan: Plan for ORIF today.    Consultants:  Orthopedic surgery   HPI/Subjective: Patient is a pleasant 68 year old gentleman with a past medical history of CVA, currently on Plavix therapy, history of seizure disorder, dyslipidemia who was in his usual state of health when on the evening of 04/16/2013 had a fall after losing his balance while changing clothes. X-ray showed subcapital left femoral neck fracture.   Patient take to OR on 2/03/08/13, undergoing ORIF. He was noted by nursing staff to be sedated after receiving IV morphine yesterday prior PT evaluation. He has had some confusion in the last 24 hours, during my encounter he appears easily distractible, having difficulties with calculation.    Objective: Filed Vitals:   04/19/13 1600  BP:   Pulse:   Temp:   Resp: 18    Intake/Output Summary (Last 24 hours) at 04/19/13 1700 Last data filed at 04/19/13 1500  Gross per 24 hour  Intake   2250 ml  Output   1300 ml  Net    950 ml   Filed Weights   04/16/13 2301  Weight: 81.012 kg (178 lb 9.6 oz)    Exam:   General:  Appears  well. No acute distress. .  Cardiovascular: Regular rate rhythm normal S1-S2 no murmurs or gallop  Respiratory: Clear to auscultation bilaterally  Abdomen: Soft nontender nondistended  Musculoskeletal: Surgical incision site appears clean, without evidence of hematoma or infection.   Data Reviewed: Basic Metabolic Panel:  Recent Labs Lab 04/16/13 2023 04/17/13 0649 04/18/13 0805 04/19/13 0555  NA 135* 139 135* 137  K 4.2 4.6 4.3 3.7  CL 96 99 98 100  CO2 28 27 26 26   GLUCOSE 101* 82 91 113*  BUN 17 17 16 12   CREATININE 1.14 1.01 0.94 0.89  CALCIUM 9.5 9.3 8.4 8.3*   Liver Function Tests:  Recent Labs Lab 04/17/13 0649  AST 28  ALT 16  ALKPHOS 65  BILITOT 0.5  PROT 7.1  ALBUMIN 3.9   No results found for this basename: LIPASE, AMYLASE,  in the last 168 hours No results found for this basename: AMMONIA,  in the last 168 hours CBC:  Recent Labs Lab 04/16/13 2023 04/17/13 0649 04/18/13 0805 04/19/13 0555  WBC 11.4* 9.1 8.8 9.5  NEUTROABS 9.7* 7.2  --   --   HGB 13.0 13.6 10.2* 9.4*  HCT 40.5 40.8 32.4* 28.7*  MCV 88.4 88.3 89.0 87.2  PLT 237 246 187 168   Cardiac Enzymes: No results found for this basename: CKTOTAL, CKMB, CKMBINDEX, TROPONINI,  in the last 168 hours BNP (last 3 results) No results found for this basename: PROBNP,  in the last 8760 hours CBG: No results found for this basename: GLUCAP,  in the last 168 hours  Recent Results (from the past 240 hour(s))  URINE CULTURE     Status: None   Collection Time    04/17/13 12:05 AM      Result Value Ref Range Status   Specimen Description URINE, CLEAN CATCH   Final   Special Requests NONE   Final   Culture  Setup Time     Final   Value: 04/17/2013 09:22     Performed at [facility]     Final   Value: >=100,000 COLONIES/ML     Performed at [facility]   Culture     Final   Value: ENTEROCOCCUS SPECIES     Performed at [facility]   Report Status  04/19/2013 FINAL   Final   Organism ID, Bacteria ENTEROCOCCUS SPECIES   Final     Studies: Dg Pelvis Portable  04/17/2013   CLINICAL DATA:  Postop left hip arthroplasty  EXAM: PORTABLE PELVIS 1-2 VIEWS  COMPARISON:  DG PELVIS 1-2 VIEWS dated 01/14/2013; DG FEMUR*L* dated 04/16/2013  FINDINGS: Post left bipolar hip arthroplasty. No fracture or dislocation. Alignment appears near anatomic. There is a very minimal amount of scattered subcutaneous emphysema about the operative site. Multiple surgical clips overlie the lower pelvis. No radiopaque foreign body.  IMPRESSION: Post left bipolar hip arthroplasty without evidence of complication.   Electronically Signed   By: Sandi Mariscal M.D.   On: 04/17/2013 19:46    Scheduled Meds: . acetaminophen  650 mg Oral TID  . atorvastatin  40 mg Oral QHS  . clopidogrel  75 mg Oral QAC breakfast  . docusate sodium  100 mg Oral BID  . donepezil  10 mg Oral QHS  . ferrous sulfate  325 mg Oral TID PC  . fesoterodine  8 mg Oral Daily  . fluticasone  2 spray Each Nare Daily  . levETIRAcetam  750 mg Oral Q12H  . loratadine  10 mg Oral Daily  . ondansetron (ZOFRAN) IV  4 mg Intravenous Once  . PHENobarbital  97.2 mg Oral QHS   Continuous Infusions: . sodium chloride 50 mL/hr at 04/19/13 1029    Principal Problem:   Femur fracture, left Active Problems:   CVA (cerebral infarction)   Seizures   Glioma of brain   Difficulty walking   Femur fracture    Time spent: 35 minutes    Rodney Potts  [facility] Pager [phone]. If 7PM-7AM, please contact night-coverage at [email or web address], password [facility] 04/19/2013, 5:00 PM  LOS: 3 days

## 2013-04-19 NOTE — Progress Notes (Signed)
   Subjective: 2 Days Post-Op Procedure(s) (LRB): ARTHROPLASTY BIPOLAR HIP (Left)   Patient reports pain as mild, pain controlled. He is in bed with hand mitten in place. He states he wants to eat, but it will be difficult with those in place.  I relayed eating assistance to the tech on the floor.  Objective:   VITALS:   Filed Vitals:   04/19/13  BP: 137/80  Pulse: 89  Temp: 99.2 F (37.3 C)   Resp: 16    Neurovascular intact Dorsiflexion/Plantar flexion intact Incision: dressing C/D/I No cellulitis present Compartment soft  LABS  Recent Labs  04/17/13 0649 04/18/13 0805 04/19/13 0555  HGB 13.6 10.2* 9.4*  HCT 40.8 32.4* 28.7*  WBC 9.1 8.8 9.5  PLT 246 187 168     Recent Labs  04/17/13 0649 04/18/13 0805 04/19/13 0555  NA 139 135* 137  K 4.6 4.3 3.7  BUN 17 16 12   CREATININE 1.01 0.94 0.89  GLUCOSE 82 91 113*     Assessment/Plan: 2 Days Post-Op Procedure(s) (LRB): ARTHROPLASTY BIPOLAR HIP (Left) Up with therapy Discharge to SNF/rehab eventually, when ready Continue Plavix for anticoagulation, was on prior to surgery do to recent TIA Norco for pain management, Rx written WBAT on the left leg. Maintain dressing until follow up in clinic Follow up in 2 weeks at [facility]. Follow up with OLIN,Bess Saltzman D in 2 weeks.  Contact information:  [facility] [address], [address] [place]   PAC  04/19/2013, 9:20 AM

## 2013-04-20 ENCOUNTER — Encounter (HOSPITAL_COMMUNITY): Payer: Self-pay | Admitting: Orthopedic Surgery

## 2013-04-20 DIAGNOSIS — W010XXA Fall on same level from slipping, tripping and stumbling without subsequent striking against object, initial encounter: Secondary | ICD-10-CM

## 2013-04-20 DIAGNOSIS — R5381 Other malaise: Secondary | ICD-10-CM

## 2013-04-20 DIAGNOSIS — S72009A Fracture of unspecified part of neck of unspecified femur, initial encounter for closed fracture: Secondary | ICD-10-CM

## 2013-04-20 LAB — BASIC METABOLIC PANEL
BUN: 17 mg/dL (ref 6–23)
CALCIUM: 7.7 mg/dL — AB (ref 8.4–10.5)
CO2: 28 meq/L (ref 19–32)
CREATININE: 0.88 mg/dL (ref 0.50–1.35)
Chloride: 104 mEq/L (ref 96–112)
GFR calc Af Amer: >90 mL/min (ref >90)
GFR, EST NON AFRICAN AMERICAN: 88 mL/min — AB (ref >90)
Glucose, Bld: 97 mg/dL (ref 70–99)
Potassium: 3.4 mEq/L — ABNORMAL LOW (ref 3.7–5.3)
SODIUM: 140 meq/L (ref 137–147)

## 2013-04-20 LAB — CBC
HCT: 24.2 % — ABNORMAL LOW (ref 39.0–52.0)
Hemoglobin: 8 g/dL — ABNORMAL LOW (ref 13.0–17.0)
MCH: 29 pg (ref 26.0–34.0)
MCHC: 33.1 g/dL (ref 30.0–36.0)
MCV: 87.7 fL (ref 78.0–100.0)
PLATELETS: 154 10*3/uL (ref 150–400)
RBC: 2.76 MIL/uL — AB (ref 4.22–5.81)
RDW: 15.3 % (ref 11.5–15.5)
WBC: 8.7 10*3/uL (ref 4.0–10.5)

## 2013-04-20 MED ORDER — METHOCARBAMOL 500 MG PO TABS
500.0000 mg | ORAL_TABLET | Freq: Three times a day (TID) | ORAL | Status: DC | PRN
  Administered 2013-04-20 – 2013-04-23 (×4): 500 mg via ORAL
  Filled 2013-04-20 (×5): qty 1

## 2013-04-20 MED ORDER — POTASSIUM CHLORIDE CRYS ER 20 MEQ PO TBCR
40.0000 meq | EXTENDED_RELEASE_TABLET | Freq: Once | ORAL | Status: AC
  Administered 2013-04-20: 40 meq via ORAL
  Filled 2013-04-20: qty 2

## 2013-04-20 MED ORDER — NAPHAZOLINE HCL 0.1 % OP SOLN
1.0000 [drp] | Freq: Four times a day (QID) | OPHTHALMIC | Status: DC | PRN
  Administered 2013-04-21: 1 [drp] via OPHTHALMIC
  Filled 2013-04-20: qty 15

## 2013-04-20 MED ORDER — HYDROCOD POLST-CHLORPHEN POLST 10-8 MG/5ML PO LQCR
5.0000 mL | Freq: Two times a day (BID) | ORAL | Status: DC | PRN
  Administered 2013-04-20: 5 mL via ORAL
  Filled 2013-04-20: qty 5

## 2013-04-20 NOTE — Progress Notes (Signed)
[facility] PROGRESS NOTE  Rodney Potts NWG:[id number] DOB: [birth year] DOA: 04/16/2013 PCP: Rodney Seller, MD  Assessment/Plan: 1. Left femoral neck fracture, status post mechanical fall at home. S/P ORIF, procedure performed by Dr Tonita Cong, no immediate complications. PT consulted.  2. History of seizure disorder. He denies recent seizure activity, will continue Keppra 750 mg by mouth twice a day and phenobarbital 97 mg each bedtime. 3. Probable delirium. Improving today, minimizing polypharmacy and psychropic medications.   4. History of CVA. Plavix being held for surgical intervention by orthopedic surgery. 5. Dyslipidemia. Continue atorvastatin 40 mg by mouth daily  Code Status: Full Code Family Communication: I spoke with patient's wife at bedside Disposition Plan: Plan for transition to inpatient rehab vs SNF   Consultants:  Orthopedic surgery   HPI/Subjective: Patient is a pleasant 68 year old gentleman with a past medical history of CVA, currently on Plavix therapy, history of seizure disorder, dyslipidemia who was in his usual state of health when on the evening of 04/16/2013 had a fall after losing his balance while changing clothes. X-ray showed subcapital left femoral neck fracture.   Patient take to OR on 2/03/08/13, undergoing ORIF. He was noted by nursing staff to be sedated after receiving IV morphine yesterday prior PT evaluation. He has had some confusion in the last 24 hours, during my encounter he appears easily distractible, having difficulties with calculation on 04/19/13.   On 04/20/13 he appears improved with regards to his delirium. He knows this is [facility], Feb, 2015, [place], calculation intact.     Objective: Filed Vitals:   04/20/13 1340  BP: 122/66  Pulse: 85  Temp: 98.6 F (37 C)  Resp: 18    Intake/Output Summary (Last 24 hours) at 04/20/13 1605 Last data filed at 04/20/13 1300  Gross per 24 hour  Intake 1718.84 ml  Output    1225 ml  Net 493.84 ml   Filed Weights   04/16/13 2301  Weight: 81.012 kg (178 lb 9.6 oz)    Exam:   General:  Appears well. No acute distress. .  Cardiovascular: Regular rate rhythm normal S1-S2 no murmurs or gallop  Respiratory: Clear to auscultation bilaterally  Abdomen: Soft nontender nondistended  Musculoskeletal: Surgical incision site appears clean, without evidence of hematoma or infection.   Data Reviewed: Basic Metabolic Panel:  Recent Labs Lab 04/16/13 2023 04/17/13 0649 04/18/13 0805 04/19/13 0555 04/20/13 0445  NA 135* 139 135* 137 140  K 4.2 4.6 4.3 3.7 3.4*  CL 96 99 98 100 104  CO2 28 27 26 26 28   GLUCOSE 101* 82 91 113* 97  BUN 17 17 16 12 17   CREATININE 1.14 1.01 0.94 0.89 0.88  CALCIUM 9.5 9.3 8.4 8.3* 7.7*   Liver Function Tests:  Recent Labs Lab 04/17/13 0649  AST 28  ALT 16  ALKPHOS 65  BILITOT 0.5  PROT 7.1  ALBUMIN 3.9   No results found for this basename: LIPASE, AMYLASE,  in the last 168 hours No results found for this basename: AMMONIA,  in the last 168 hours CBC:  Recent Labs Lab 04/16/13 2023 04/17/13 0649 04/18/13 0805 04/19/13 0555 04/20/13 0445  WBC 11.4* 9.1 8.8 9.5 8.7  NEUTROABS 9.7* 7.2  --   --   --   HGB 13.0 13.6 10.2* 9.4* 8.0*  HCT 40.5 40.8 32.4* 28.7* 24.2*  MCV 88.4 88.3 89.0 87.2 87.7  PLT 237 246 187 168 154   Cardiac Enzymes: No results found for this basename: CKTOTAL,  CKMB, CKMBINDEX, TROPONINI,  in the last 168 hours BNP (last 3 results) No results found for this basename: PROBNP,  in the last 8760 hours CBG: No results found for this basename: GLUCAP,  in the last 168 hours  Recent Results (from the past 240 hour(s))  URINE CULTURE     Status: None   Collection Time    04/17/13 12:05 AM      Result Value Ref Range Status   Specimen Description URINE, CLEAN CATCH   Final   Special Requests NONE   Final   Culture  Setup Time     Final   Value: 04/17/2013 09:22     Performed at  [facility]     Final   Value: >=100,000 COLONIES/ML     Performed at [facility]   Culture     Final   Value: ENTEROCOCCUS SPECIES     Performed at [facility]   Report Status 04/19/2013 FINAL   Final   Organism ID, Bacteria ENTEROCOCCUS SPECIES   Final     Studies: No results found.  Scheduled Meds: . acetaminophen  650 mg Oral TID  . atorvastatin  40 mg Oral QHS  . clopidogrel  75 mg Oral QAC breakfast  . docusate sodium  100 mg Oral BID  . donepezil  10 mg Oral QHS  . ferrous sulfate  325 mg Oral TID PC  . fesoterodine  8 mg Oral Daily  . fluticasone  2 spray Each Nare Daily  . levETIRAcetam  750 mg Oral Q12H  . loratadine  10 mg Oral Daily  . ondansetron (ZOFRAN) IV  4 mg Intravenous Once  . PHENobarbital  97.2 mg Oral QHS  . sodium chloride  3 mL Intravenous Q12H   Continuous Infusions: . sodium chloride 50 mL/hr at 04/20/13 0867    Principal Problem:   Femur fracture, left Active Problems:   CVA (cerebral infarction)   Seizures   Glioma of brain   Difficulty walking   Femur fracture    Time spent: 35 minutes    [name]  [facility] Pager [phone]. If 7PM-7AM, please contact night-coverage at [email or web address], password [facility] 04/20/2013, 4:05 PM  LOS: 4 days

## 2013-04-20 NOTE — Progress Notes (Signed)
Patient ID: Rodney Potts, male   DOB: 07-08-1945, 68 y.o.   MRN: [id number] Subjective: 3 Days Post-Op Procedure(s) (LRB): ARTHROPLASTY BIPOLAR HIP (Left)    Patient reports pain as mild unless he coughs or moves much.  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 - stable, other than pain limiting  LABS  Recent Labs  04/18/13 0805 04/19/13 0555 04/20/13 0445  HGB 10.2* 9.4* 8.0*  HCT 32.4* 28.7* 24.2*  WBC 8.8 9.5 8.7  PLT 187 168 154     Recent Labs  04/18/13 0805 04/19/13 0555 04/20/13 0445  NA 135* 137 140  K 4.3 3.7 3.4*  BUN 16 12 17   CREATININE 0.94 0.89 0.88  GLUCOSE 91 113* 97    No results found for this basename: LABPT, INR,  in the last 72 hours   Assessment/Plan: 3 Days Post-Op Procedure(s) (LRB): ARTHROPLASTY BIPOLAR HIP (Left)   Advance diet Up with therapy Discharge to SNF or CIR  Plavix pre-operatively for stroke will be continued for DVT prophylactic purposes

## 2013-04-20 NOTE — Progress Notes (Addendum)
Rehab Admissions Coordinator Note:  Patient was screened by Cleatrice Burke for appropriateness for an Inpatient Acute Rehab Consult by OT.   At this time, we are recommending Inpatient Rehab consult postop . Patient previously admitted to inpt rehab 01/2013.  Cleatrice Burke 04/20/2013, 9:55 AM  I can be reached at [phone].

## 2013-04-20 NOTE — Progress Notes (Signed)
Physical Therapy Treatment Patient Details Name: STROTHER EVERITT MRN: [id number] DOB: 1945/12/30 Today's Date: 04/20/2013 Time: 4742-5956 PT Time Calculation (min): 23 min  PT Assessment / Plan / Recommendation  History of Present Illness Morrell Fluke Hain is a 68 y.o. 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 Pt continues to require significant +2 assist for all mobility. Able to perform stand pivot to recliner with walker this session. Feel pt could benefit from CIR for continued rehab.   Follow Up Recommendations  CIR     Does the patient have the potential to tolerate intense rehabilitation     Barriers to Discharge        Equipment Recommendations  None recommended by PT    Recommendations for Other Services OT consult  Frequency Min 3X/week   Progress towards PT Goals Progress towards PT goals: Progressing toward goals (slowly)  Plan Discharge plan needs to be updated    Precautions / Restrictions Precautions Precautions: Posterior Hip;Fall Precaution Booklet Issued: Yes (comment) Precaution Comments: Reviewed posterior precautions and WBAT status.  Restrictions Weight Bearing Restrictions: Yes LLE Weight Bearing: Weight bearing as tolerated   Pertinent Vitals/Pain L LE 0/10 at rest; 8/10 with activity    Mobility  Bed Mobility Overal bed mobility: Needs Assistance Supine to sit: +2 for physical assistance;+2 for safety/equipment;Max assist General bed mobility comments: multi-modal cues for sequencing and facilitation of safe techniques. Increased time. Assist for trunk and bil LEs.  Transfers Overall transfer level: Needs assistance Equipment used: Rolling walker (2 wheeled) Transfers: Sit to/from [employer] Sit to Stand: Max assist;+2 safety/equipment;+2 physical assistance Stand pivot transfers: Max assist General transfer comment: Assist to rise, stabilize, control descent.  x 2 attempts. Stand pivot towards R side with walker. Pt has difficulty taking weight on L LE-buckling noted. Increased time to complete transfer    Exercises General Exercises - Lower Extremity Ankle Circles/Pumps: AROM;10 reps;Seated Quad Sets: AROM;AAROM;10 reps;Seated Heel Slides: AAROM;Left;10 reps;Seated Hip ABduction/ADduction: AAROM;Left;10 reps;Seated   PT Diagnosis:    PT Problem List:   PT Treatment Interventions:     PT Goals (current goals can now be found in the care plan section)    Visit Information  Last PT Received On: 04/20/13 Assistance Needed: +2 History of Present Illness: Lavaughn Haberle Tiller is a 68 y.o. 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, recent TIA, prior CVA, history of seizure, prostate cancer; He fell prior to thsi adm resulting in hip fx and underwent  L hip hemiarthroplasty 04/17/13    Subjective Data      Cognition  Cognition Arousal/Alertness: Awake/alert Behavior During Therapy: Flat affect Overall Cognitive Status: Impaired/Different from baseline Area of Impairment: Attention;Memory;Following commands;Safety/judgement Orientation Level: Disoriented to;Time Current Attention Level: Sustained Memory: Decreased short-term memory;Decreased recall of precautions Following Commands: Follows one step commands inconsistently Safety/Judgement: Decreased awareness of safety;Decreased awareness of deficits Problem Solving: Slow processing;Decreased initiation;Difficulty sequencing;Requires verbal cues;Requires tactile cues General Comments: wife states pt usually has a hard time "coming out of anesthesia"    Balance  Balance Overall balance assessment: Needs assistance Sitting-balance support: Bilateral upper extremity  supported;Feet supported Sitting balance-Leahy Scale: Poor Standing balance support: Bilateral upper extremity supported Standing balance-Leahy Scale: Poor  End of Session PT - End of Session Equipment Utilized During Treatment: Gait belt Activity Tolerance: Patient limited by pain;Patient limited by fatigue Patient left: in chair;with call bell/phone within reach;with family/visitor present Nurse Communication: Mobility status   GP     Weston Anna, MPT Pager: [phone]

## 2013-04-20 NOTE — Consult Note (Signed)
Physical Medicine and Rehabilitation Consult  Reason for Consult: Left femur fracture, gait disorder. Referring Physician:  Dr. Alvan Dame   HPI: Rodney Potts is a 68 y.o. male with history; of CVA, s/p glioma RSXN,  TIA 02/2014, gait disorder (CIR stay 01/2014) who slipped and fell at home on 04/16/13 with sudden onset of left hip pain with inability to weight bear on LLE. Xrays with left subcapital femoral neck fracture and patient underwent Left hip hemi by Dr. Alvan Dame on 04/17/13. Post op WBAT and plavix resumed. Patient with worsening confusion as well as difficulty following commands, poor attention as well as problems with mobility.   UCS with > 100,000 enterococcus. MD, OT recommending CIR.    Review of Systems  Constitutional: Negative.   HENT: Negative.   Eyes: Positive for blurred vision.  Respiratory: Negative.   Cardiovascular: Negative.   Gastrointestinal: Positive for diarrhea and constipation.  Genitourinary: Negative.   Musculoskeletal: Positive for joint pain.  Skin: Negative.   Neurological: Positive for dizziness and focal weakness.  Endo/Heme/Allergies: Bruises/bleeds easily.  Psychiatric/Behavioral: Positive for memory loss.    Past Medical History  Diagnosis Date  . Brain tumor     glioma; S/P radiation and chemotherapy (01/14/2013)  . TIA (transient ischemic attack) ~ 2011  . IBS (irritable bowel syndrome)   . Shingles   . CVA (cerebral infarction)     R thalamic  . Allergy   . Complication of anesthesia     "after knee OR woke up very wild; pulling things out, etc" (01/14/2013)  . High cholesterol   . Sleep apnea     "corrected w/OR" (01/14/2013)  . Seizures     "hx from the scar tissue where brain tumor was; on RX to control" (01/14/2013)  . Stroke     "he's had a couple; still a little weaker on the left leg, drags that foot" (01/14/2013)  . Prostate cancer    Past Surgical History  Procedure Laterality Date  . Cataract extraction w/  intraocular lens implant Bilateral 2000's    "don't know if they put lens in" (01/14/2013)  . Joint replacement    . Prostatectomy  1990's  . Uvulopalatopharyngoplasty  1980's    "for sleep apnea" (01/14/2013)  . Shoulder arthroscopy Right 2000's  . Total knee arthroplasty Bilateral 2000's  . Hip arthroplasty Left 04/17/2013    Procedure: ARTHROPLASTY BIPOLAR HIP;  Surgeon: Mauri Pole, MD;  Location: [facility];  Service: Orthopedics;  Laterality: Left;   Family History  Problem Relation Age of Onset  . Hypertension Father   . Transient ischemic attack Mother   . Cancer Mother    Social History:  Married. Lives with wife. Attends adult day care during the day. Uses cane for mobility. Per reports that he quit smoking about 50 years ago. His smoking use included Cigarettes. He has a .48 pack-year smoking history. He has never used smokeless tobacco. Per reports that he drinks alcohol. He reports that he does not use illicit drugs.  Allergies  Allergen Reactions  . Amoxicillin Diarrhea  . Dilaudid [Hydromorphone Hcl] Other (See Comments)    DELUSIONS  . Lactose Intolerance (Gi) Diarrhea  . Sulfa Antibiotics Other (See Comments)    Cant control bladder   Medications Prior to Admission  Medication Sig Dispense Refill  . Ascorbic Acid (VITAMIN C PO) Take 1 tablet by mouth daily.       Marland Kitchen atorvastatin (LIPITOR) 40 MG tablet Take 1 tablet (40  mg total) by mouth at bedtime.  30 tablet  1  . CALCIUM PO Take 1 tablet by mouth daily.      . cetirizine (ZYRTEC) 10 MG tablet Take 10 mg by mouth daily.      . cholecalciferol (VITAMIN D) 1000 UNITS tablet Take 1,000 Units by mouth daily.      . clopidogrel (PLAVIX) 75 MG tablet Take 1 tablet (75 mg total) by mouth at bedtime.  30 tablet  1  . donepezil (ARICEPT) 10 MG tablet Take 1 tablet (10 mg total) by mouth at bedtime.  90 tablet  1  . fesoterodine (TOVIAZ) 8 MG TB24 tablet Take 1 tablet (8 mg total) by mouth daily.  30 tablet  1  .  fluticasone (FLONASE) 50 MCG/ACT nasal spray Place 2 sprays into both nostrils daily.       Marland Kitchen levETIRAcetam (KEPPRA) 750 MG tablet Take 1 tablet (750 mg total) by mouth every 12 (twelve) hours.  60 tablet  1  . Omega-3 Fatty Acids (FISH OIL PO) Take 1 capsule by mouth daily.      Marland Kitchen PHENobarbital (LUMINAL) 97.2 MG tablet Take 1 tablet (97.2 mg total) by mouth at bedtime.  90 tablet  3  . Specialty Vitamins Products (MAGNESIUM, AMINO ACID CHELATE,) 133 MG tablet Take 1 tablet by mouth at bedtime.      Marland Kitchen VITAMIN E PO Take 1 tablet by mouth daily.        Home: Home Living Family/patient expects to be discharged to:: Unsure Living Arrangements: Spouse/significant other Available Help at Discharge: Other (Comment);Family Home Equipment: Kasandra Knudsen - single point;Walker - 2 wheels;Bedside commode;Shower seat Additional Comments: pt goes to adult daycare center per wife  Functional History: Prior Function Comments: used cane prior  to adm to amb Functional Status:  Mobility:          ADL: ADL Eating/Feeding: Set up;Minimal assistance Where Assessed - Eating/Feeding: Bed level Grooming: Moderate assistance Where Assessed - Grooming: Supported sitting Upper Body Bathing: Moderate assistance Where Assessed - Upper Body Bathing: Supported sitting Lower Body Bathing: +1 Total assistance Where Assessed - Lower Body Bathing: Supported sit to stand Upper Body Dressing: Maximal assistance Where Assessed - Upper Body Dressing: Supported sitting Lower Body Dressing: +1 Total assistance Where Assessed - Lower Body Dressing: Supported sit to stand Equipment Used: Other (comment);Gait belt (lift equipment) Transfers/Ambulation Related to ADLs: unable to achieve upright posture with +2 total A using sara stedy. Pt appers to be pushing posteriorly and unable to shift weight anteriorly to stand. L foot moving off foot plate when trying to stand, requiring tactile cues and blocking of L knee to position  properly ADL Comments: affected by cognitive status  Cognition: Cognition Overall Cognitive Status: Impaired/Different from baseline Orientation Level: Oriented to person;Disoriented to time;Disoriented to place;Disoriented to situation Cognition Arousal/Alertness: Awake/alert Behavior During Therapy: Flat affect Overall Cognitive Status: Impaired/Different from baseline Area of Impairment: Orientation;Attention;Memory;Following commands;Safety/judgement Orientation Level: Disoriented to;Time Current Attention Level: Sustained (internally distracted) Memory: Decreased recall of precautions;Decreased short-term memory Following Commands: Follows one step commands inconsistently Safety/Judgement: Decreased awareness of safety;Decreased awareness of deficits Awareness: Intellectual Problem Solving: Slow processing;Decreased initiation;Difficulty sequencing;Requires verbal cues;Requires tactile cues General Comments: wife states pt usually has a hard time "coming out of anesthesia"  Blood pressure 96/52, pulse 62, temperature 98.1 F (36.7 C), temperature source Oral, resp. rate 18, height 5' 9.5" (1.765 m), weight 81.012 kg (178 lb 9.6 oz), SpO2 100.00%. Physical Exam  Nursing note and vitals reviewed.  Constitutional: He is oriented to person, place, and time. He appears well-developed and well-nourished.  HENT:  Head: Normocephalic and atraumatic.  Right Ear: External ear normal.  Left Ear: External ear normal.  Nose: Nose normal.  Mouth/Throat: Oropharynx is clear and moist.  Eyes: Conjunctivae and EOM are normal. Pupils are equal, round, and reactive to light.  Neck: Normal range of motion. Neck supple.  Cardiovascular: Normal rate, regular rhythm, normal heart sounds and intact distal pulses.   Respiratory: Effort normal and breath sounds normal.  GI: Soft. Bowel sounds are normal.  Musculoskeletal: Normal range of motion.  Neurological: He is alert and oriented to person,  place, and time. He has normal reflexes. No cranial nerve deficit or sensory deficit. Gait abnormal.  Dysmetria on finger nose to finger testing in the left upper extremity, left lower extremity was not tested secondary to recent hip fracture Normal sensation to light touch in the left upper and left lower extremity Motor strength is 4+ in the left deltoid, bicep, tricep, grip 5/5 in the right deltoid, bicep, tricep, grip, hip flexor, knee extensors, ankle dorsiflexor plantar flexion Left LE. strength is 2 minus at the hip flexor knee extensor 3 minus at the ankle dorsiflexor plantar flexor No hip pain with minimal movement  Skin: Skin is warm and dry.  Left hip incision without evidence of drainage, dermabond intact, no erythema  Psychiatric: His affect is blunt and inappropriate. His speech is delayed. He is slowed and withdrawn. He exhibits abnormal remote memory. He is inattentive.    Results for orders placed during the hospital encounter of 04/16/13 (from the past 24 hour(s))  CBC     Status: Abnormal   Collection Time    04/20/13  4:45 AM      Result Value Ref Range   WBC 8.7  4.0 - 10.5 K/uL   RBC 2.76 (*) 4.22 - 5.81 MIL/uL   Hemoglobin 8.0 (*) 13.0 - 17.0 g/dL   HCT 24.2 (*) 39.0 - 52.0 %   MCV 87.7  78.0 - 100.0 fL   MCH 29.0  26.0 - 34.0 pg   MCHC 33.1  30.0 - 36.0 g/dL   RDW 15.3  11.5 - 15.5 %   Platelets 154  150 - 400 K/uL  BASIC METABOLIC PANEL     Status: Abnormal   Collection Time    04/20/13  4:45 AM      Result Value Ref Range   Sodium 140  137 - 147 mEq/L   Potassium 3.4 (*) 3.7 - 5.3 mEq/L   Chloride 104  96 - 112 mEq/L   CO2 28  19 - 32 mEq/L   Glucose, Bld 97  70 - 99 mg/dL   BUN 17  6 - 23 mg/dL   Creatinine, Ser 0.88  0.50 - 1.35 mg/dL   Calcium 7.7 (*) 8.4 - 10.5 mg/dL   GFR calc non Af Amer 88 (*) >90 mL/min   GFR calc Af Amer >90  >90 mL/min   No results found.  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 Does the need for close, 24 hr/day medical supervision in concert with the patient's rehab needs make it unreasonable for this patient to be served in a less intensive setting? Yes 2. 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 Due to bladder management, bowel  management, safety, skin/wound care, disease management, medication administration, pain management and patient education, does the patient require 24 hr/day rehab nursing? Yes 4. Does the patient require coordinated care of a physician, rehab nurse, PT (1-2 hrs/day, 5 days/week), OT (1-2 hrs/day, 5 days/week) and SLP (0.5-1 hrs/day, 5 days/week) to address physical and functional deficits in the context of the above medical diagnosis(es)? Yes Addressing deficits in the following areas: balance, endurance, locomotion, strength, transferring, bowel/bladder control, bathing, dressing, feeding, grooming, toileting, cognition and speech 5. Can the patient actively participate in an intensive therapy program of at least 3 hrs of therapy per day at least 5 days per week? Potentially 6. The potential for patient to make measurable gains while on inpatient rehab is good 7. Anticipated functional outcomes upon discharge from inpatient rehab are supervision to min assist with PT, supervision to min assist with OT, est. cognitive baseline with SLP. 8. Estimated rehab length of stay to reach the above functional goals is: 12-16 days 9. Does the patient have adequate social supports to accommodate these discharge functional goals? Yes 10. Anticipated D/C setting: Home 11. Anticipated post D/C treatments: [place] therapy 12. Overall Rehab/Functional Prognosis: good  RECOMMENDATIONS: This patient's condition is appropriate for continued rehabilitative care in the  following setting: CIR Patient has agreed to participate in recommended program. Potentially Note that insurance prior authorization may be required for reimbursement for recommended care.  Comment: Was at [facility] [place] [facility]    04/20/2013

## 2013-04-20 NOTE — Clinical Social Work Psychosocial (Signed)
     Clinical Social Work Department BRIEF PSYCHOSOCIAL ASSESSMENT 04/20/2013  Patient:  Rodney Potts, Rodney Potts     Account Number:  [id number]     Admit date:  04/16/2013  Clinical Social Worker:  Venia Minks  Date/Time:  04/20/2013 12:00 M  Referred by:  Physician  Date Referred:  04/20/2013 Referred for  SNF Placement   Other Referral:   Interview type:  Patient Other interview type:   spouse at bedside    PSYCHOSOCIAL DATA Living Status:  FAMILY Admitted from facility:   Level of care:   Primary support name:  Rodney Potts Primary support relationship to patient:  SPOUSE Degree of support available:   good    CURRENT CONCERNS Current Concerns  Post-Acute Placement   Other Concerns:    SOCIAL WORK ASSESSMENT / PLAN CSW met with patient. patient is alert and oriented X3. however, his spouse is the one who communicated with CSW. Patient appears to defer to her. CSW can not ascertain whether or not patient is in pain or depressed or just doesnt feel like talking. Spouse states that patient has previously been in CIR and they would like him to go back there. If CIR is not available, they are agreeable to snf search. A family member has mentioned [facility] to them. CSW explained bed search and some limitations based on type of insurance. They understand and are agreeable to a snf search at the same time they are hopeful for CIR.   Assessment/plan status:   Other assessment/ plan:   Information/referral to community resources:    PATIENTS/FAMILYS RESPONSE TO PLAN OF CARE: patient and spouse are really hopeful for CIR. they are, however, agreeable to snf search and hopeful that [facility] is available.

## 2013-04-20 NOTE — Plan of Care (Signed)
Problem: Phase II Progression Outcomes Goal: Bed to chair Outcome: Completed/Met Date Met:  04/20/13 With PT

## 2013-04-21 ENCOUNTER — Ambulatory Visit: Payer: Medicare Other | Admitting: Physical Therapy

## 2013-04-21 ENCOUNTER — Encounter: Payer: Medicare Other | Admitting: Occupational Therapy

## 2013-04-21 DIAGNOSIS — N390 Urinary tract infection, site not specified: Secondary | ICD-10-CM

## 2013-04-21 DIAGNOSIS — R410 Disorientation, unspecified: Secondary | ICD-10-CM | POA: Diagnosis not present

## 2013-04-21 LAB — CBC
HCT: 26.7 % — ABNORMAL LOW (ref 39.0–52.0)
HEMOGLOBIN: 8.5 g/dL — AB (ref 13.0–17.0)
MCH: 27.9 pg (ref 26.0–34.0)
MCHC: 31.8 g/dL (ref 30.0–36.0)
MCV: 87.5 fL (ref 78.0–100.0)
Platelets: 207 10*3/uL (ref 150–400)
RBC: 3.05 MIL/uL — AB (ref 4.22–5.81)
RDW: 14.9 % (ref 11.5–15.5)
WBC: 10.7 10*3/uL — ABNORMAL HIGH (ref 4.0–10.5)

## 2013-04-21 LAB — BASIC METABOLIC PANEL
BUN: 16 mg/dL (ref 6–23)
CALCIUM: 7.9 mg/dL — AB (ref 8.4–10.5)
CO2: 26 meq/L (ref 19–32)
Chloride: 103 mEq/L (ref 96–112)
Creatinine, Ser: 0.88 mg/dL (ref 0.50–1.35)
GFR calc Af Amer: >90 mL/min (ref >90)
GFR calc non Af Amer: 88 mL/min — ABNORMAL LOW (ref >90)
GLUCOSE: 104 mg/dL — AB (ref 70–99)
POTASSIUM: 4 meq/L (ref 3.7–5.3)
Sodium: 138 mEq/L (ref 137–147)

## 2013-04-21 MED ORDER — NAPHAZOLINE HCL 0.1 % OP SOLN: 1.0000 [drp] | Freq: Four times a day (QID) | OPHTHALMIC | Status: DC | PRN

## 2013-04-21 MED ORDER — HYDROCODONE-ACETAMINOPHEN 5-325 MG PO TABS: 1.0000 | ORAL_TABLET | Freq: Four times a day (QID) | ORAL | Status: DC | PRN

## 2013-04-21 MED ORDER — LEVOFLOXACIN 500 MG PO TABS: 500.0000 mg | ORAL_TABLET | Freq: Every day | ORAL | Status: AC

## 2013-04-21 MED ORDER — LEVOFLOXACIN 500 MG PO TABS
500.0000 mg | ORAL_TABLET | Freq: Every day | ORAL | Status: DC
  Administered 2013-04-21 – 2013-04-22 (×2): 500 mg via ORAL
  Filled 2013-04-21 (×2): qty 1

## 2013-04-21 MED ORDER — DSS 100 MG PO CAPS: 100.0000 mg | ORAL_CAPSULE | Freq: Two times a day (BID) | ORAL | Status: AC

## 2013-04-21 MED ORDER — ACETAMINOPHEN 325 MG PO TABS: 650.0000 mg | ORAL_TABLET | Freq: Three times a day (TID) | ORAL | Status: DC

## 2013-04-21 MED ORDER — BISACODYL 10 MG RE SUPP: 10.0000 mg | Freq: Every day | RECTAL | Status: DC | PRN

## 2013-04-21 NOTE — Progress Notes (Signed)
Rehab admissions - Evaluated for possible admission.  I have faxed clinicals to [facility] requesting acute inpatient rehab admission.  I will follow up with patient later today.  Call me for questions.  #[phone]

## 2013-04-21 NOTE — Discharge Summary (Signed)
Physician Discharge Summary  Rodney Potts [id number] DOB: [birth year] DOA: 04/16/2013  PCP: Woody Seller, MD  Admit date: 04/16/2013 Discharge date: 04/21/2013  Time spent: 35 minutes  Recommendations for Outpatient Follow-up:  1. Followup on repeat CBC in 2-3 days  Discharge Diagnoses:  Principal Problem:   Femur fracture, left Active Problems:   UTI (urinary tract infection)   Acute delirium   CVA (cerebral infarction)   Seizures   Glioma of brain   Difficulty walking   Femur fracture    Discharge Condition: Stable/improved  Diet recommendation: Heart healthy  Filed Weights   04/16/13 2301  Weight: 81.012 kg (178 lb 9.6 oz)    History of present illness:  Rodney Potts is a 68 y.o. 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  The patient is coming from home.  The patient presents with a mechanical fall. He mentions he was at home and changing his plans at which time he slipped on his floor lost his balance and hit the ground with left-sided. He had severe pain and call EMS. he denies any incontinence, dizziness lightheadedness, syncope, chest pain, palpitation at the time of the event. Pt denies any fever, chills, headache, cough, chest pain, palpitation, shortness of breath, orthopnea, PND, nausea, vomiting, abdominal pain, diarrhea, constipation, active bleeding, burning urination, dizziness, pedal edema, focal neurological deficit.  Patient recently has been admitted for TIA and is still undergoing physical therapy at home.  He is compliant with his medication and there is no recent change in his medications.  Hospital Course:  Patient is a pleasant 68 year old gentleman with a past medical history of CVA on Plavix therapy, history of seizure disorder, cognitive impairment, dyslipidemia who is in his usual state health when on the evening of 04/16/2013 had  a fall after losing his balance while changing his clothes. He was brought to the emergency room where x-ray showed a new subcapital left femoral neck fracture. Orthopedic surgery was consulted. Patient was taken to the operating room on 04/17/2013 where he underwent ORIF, procedure performed by Dr. Alvan Dame of orthopedic surgery. Tolerated procedure well there are no immediate complications. Postoperatively however he did develop acute delirium likely secondary to anesthesia and narcotic analgesics. It's possible that underlying urinary tract infection may have contributed to the development of delirium as urine cultures grew enterococcus species. Patient's however remained afebrile, hemodynamically stable. He was started on oral Levaquin 500 mg by mouth daily for a planned course of 7 days. His delirium eventually improved. Physical therapy was consulted. Referral has been made for patient to receive rehabilitation at [facility] inpatient rehabilitation. Currently awaiting insurance approval. I anticipate discharge in the next 24 hours.  Procedures:  Status post ORIF of subcapital left femoral neck fracture, performed on 04/17/2013  Consultations:  Orthopedic surgery  Social work  Physical therapy  Discharge Exam: Danley Danker Vitals:   04/21/13 1522  BP: 118/69  Pulse: 95  Temp: 98.5 F (36.9 C)  Resp: 18    General: Patient is awake alert and oriented, no acute distress Cardiovascular: Regular rate and rhythm Respiratory: Lungs clear Abdomen: Soft nontender nondistended positive bowels Extremities: Surgical incision site appears clean no evidence of infection  Discharge Instructions      Discharge Orders   Future Appointments Provider Department Dept Phone   05/21/2013 3:00 PM Philmore Pali, NP [facility] [phone]   Future Orders Complete By Expires  Call MD for:  difficulty breathing, headache or visual disturbances  As directed    Call MD for:  extreme fatigue   As directed    Call MD for:  persistant dizziness or light-headedness  As directed    Call MD for:  persistant nausea and vomiting  As directed    Call MD for:  severe uncontrolled pain  As directed    Call MD for:  temperature >100.4  As directed    Change dressing  As directed    Comments:     Don't change dressing.  Maintain dressing for 14 day or until return to clinic. Waterproof dressing.   Diet - low sodium heart healthy  As directed    Increase activity slowly  As directed    Weight bearing as tolerated  As directed    Questions:     Laterality:  left   Extremity:  Lower       Medication List    STOP taking these medications       CALCIUM PO     magnesium (amino acid chelate) 133 MG tablet     VITAMIN E PO      TAKE these medications       acetaminophen 325 MG tablet  Commonly known as:  TYLENOL  Take 2 tablets (650 mg total) by mouth 3 (three) times daily.     atorvastatin 40 MG tablet  Commonly known as:  LIPITOR  Take 1 tablet (40 mg total) by mouth at bedtime.     bisacodyl 10 MG suppository  Commonly known as:  DULCOLAX  Place 1 suppository (10 mg total) rectally daily as needed for moderate constipation.     cetirizine 10 MG tablet  Commonly known as:  ZYRTEC  Take 10 mg by mouth daily.     cholecalciferol 1000 UNITS tablet  Commonly known as:  VITAMIN D  Take 1,000 Units by mouth daily.     clopidogrel 75 MG tablet  Commonly known as:  PLAVIX  Take 1 tablet (75 mg total) by mouth at bedtime.     donepezil 10 MG tablet  Commonly known as:  ARICEPT  Take 1 tablet (10 mg total) by mouth at bedtime.     DSS 100 MG Caps  Take 100 mg by mouth 2 (two) times daily.     fesoterodine 8 MG Tb24 tablet  Commonly known as:  TOVIAZ  Take 1 tablet (8 mg total) by mouth daily.     FISH OIL PO  Take 1 capsule by mouth daily.     fluticasone 50 MCG/ACT nasal spray  Commonly known as:  FLONASE  Place 2 sprays into both nostrils daily.      levETIRAcetam 750 MG tablet  Commonly known as:  KEPPRA  Take 1 tablet (750 mg total) by mouth every 12 (twelve) hours.     levofloxacin 500 MG tablet  Commonly known as:  LEVAQUIN  Take 1 tablet (500 mg total) by mouth daily.     naphazoline 0.1 % ophthalmic solution  Commonly known as:  NAPHCON  Place 1 drop into both eyes 4 (four) times daily as needed for irritation.     PHENobarbital 97.2 MG tablet  Commonly known as:  LUMINAL  Take 1 tablet (97.2 mg total) by mouth at bedtime.     VITAMIN C PO  Take 1 tablet by mouth daily.       Allergies  Allergen Reactions  . Amoxicillin Diarrhea  . Dilaudid [Hydromorphone  Hcl] Other (See Comments)    DELUSIONS  . Lactose Intolerance (Gi) Diarrhea  . Sulfa Antibiotics Other (See Comments)    Cant control bladder   Follow-up Information   Follow up with Mauri Pole, MD. Schedule an appointment as soon as possible for a visit in 2 weeks.   Specialty:  Orthopedic Surgery   Contact information:   [address] [place] [address] [phone]        The results of significant diagnostics from this hospitalization (including imaging, microbiology, ancillary and laboratory) are listed below for reference.    Significant Diagnostic Studies: Dg Chest 1 View  04/16/2013   CLINICAL DATA:  Fall.  Hip pain.  EXAM: CHEST - 1 VIEW  COMPARISON:  01/15/2013  FINDINGS: Cardiac silhouette is normal in size. Aorta is mildly uncoiled. No mediastinal or hilar masses.  Clear lungs.  The bony thorax is intact.  IMPRESSION: No active disease.   Electronically Signed   By: Lajean Manes M.D.   On: 04/16/2013 20:02   Dg Femur Left  04/16/2013   CLINICAL DATA:  Recent traumatic injury with pain  EXAM: LEFT FEMUR - 2 VIEW  COMPARISON:  None.  FINDINGS: There is a subcapital femoral neck fracture identified with mild impaction at the fracture site. No other acute abnormality is noted. A prior left knee prosthesis is seen.   IMPRESSION: Subcapital left femoral neck fracture.   Electronically Signed   By: Inez Catalina M.D.   On: 04/16/2013 19:59   Dg Pelvis Portable  04/17/2013   CLINICAL DATA:  Postop left hip arthroplasty  EXAM: PORTABLE PELVIS 1-2 VIEWS  COMPARISON:  DG PELVIS 1-2 VIEWS dated 01/14/2013; DG FEMUR*L* dated 04/16/2013  FINDINGS: Post left bipolar hip arthroplasty. No fracture or dislocation. Alignment appears near anatomic. There is a very minimal amount of scattered subcutaneous emphysema about the operative site. Multiple surgical clips overlie the lower pelvis. No radiopaque foreign body.  IMPRESSION: Post left bipolar hip arthroplasty without evidence of complication.   Electronically Signed   By: Sandi Mariscal M.D.   On: 04/17/2013 19:46    Microbiology: Recent Results (from the past 240 hour(s))  URINE CULTURE     Status: None   Collection Time    04/17/13 12:05 AM      Result Value Ref Range Status   Specimen Description URINE, CLEAN CATCH   Final   Special Requests NONE   Final   Culture  Setup Time     Final   Value: 04/17/2013 09:22     Performed at [facility]     Final   Value: >=100,000 COLONIES/ML     Performed at [facility]   Culture     Final   Value: ENTEROCOCCUS SPECIES     Performed at [facility]   Report Status 04/19/2013 FINAL   Final   Organism ID, Bacteria ENTEROCOCCUS SPECIES   Final     Labs: Basic Metabolic Panel:  Recent Labs Lab 04/17/13 0649 04/18/13 0805 04/19/13 0555 04/20/13 0445 04/21/13 0355  NA 139 135* 137 140 138  K 4.6 4.3 3.7 3.4* 4.0  CL 99 98 100 104 103  CO2 27 26 26 28 26   GLUCOSE 82 91 113* 97 104*  BUN 17 16 12 17 16   CREATININE 1.01 0.94 0.89 0.88 0.88  CALCIUM 9.3 8.4 8.3* 7.7* 7.9*   Liver Function Tests:  Recent Labs Lab 04/17/13 0649  AST 28  ALT 16  ALKPHOS 65  BILITOT 0.5  PROT 7.1  ALBUMIN 3.9   No results found for this basename: LIPASE, AMYLASE,  in the last 168  hours No results found for this basename: AMMONIA,  in the last 168 hours CBC:  Recent Labs Lab 04/16/13 2023 04/17/13 0649 04/18/13 0805 04/19/13 0555 04/20/13 0445 04/21/13 0355  WBC 11.4* 9.1 8.8 9.5 8.7 10.7*  NEUTROABS 9.7* 7.2  --   --   --   --   HGB 13.0 13.6 10.2* 9.4* 8.0* 8.5*  HCT 40.5 40.8 32.4* 28.7* 24.2* 26.7*  MCV 88.4 88.3 89.0 87.2 87.7 87.5  PLT 237 246 187 168 154 207   Cardiac Enzymes: No results found for this basename: CKTOTAL, CKMB, CKMBINDEX, TROPONINI,  in the last 168 hours BNP: BNP (last 3 results) No results found for this basename: PROBNP,  in the last 8760 hours CBG: No results found for this basename: GLUCAP,  in the last 168 hours     Signed:  [name]  [facility] 04/21/2013, 4:32 PM

## 2013-04-21 NOTE — Progress Notes (Signed)
  CARE MANAGEMENT NOTE 04/21/2013  Patient:  Rodney Potts, Rodney Potts   Account Number:  [id number]  Date Initiated:  04/17/2013  Documentation initiated by:  Dessa Phi  Subjective/Objective Assessment:   68 Y/O M ADMITTED W/FALL[place].     Action/Plan:   FROM HOME W/SPOUSE.HAS PCP,PHARMACY.   Anticipated DC Date:  04/21/2013   Anticipated DC Plan:  IP REHAB FACILITY      DC Planning Services  CM consult      Choice offered to / List presented to:             Status of service:  In process, will continue to follow Medicare Important Message given?   (If response is "NO", the following Medicare IM given date fields will be blank) Date Medicare IM given:   Date Additional Medicare IM given:    Discharge Disposition:    Per UR Regulation:  Reviewed for med. necessity/level of care/duration of stay  If discussed at [place] of Stay Meetings, dates discussed:   04/21/2013    Comments:  04/21/13 Amarys Sliwinski RN,BSN NCM [phone] POD#4,CIR-EVAL-RECOMMENDED FOR CIR.GENIE CIR REHAB COORDIN FOLLOWING.NSG TO MANAGE TRANSFER VIA [place] IF APPROVED.CARELINK TEL#[phone].  04/20/13 Damari Suastegui RN,BSN NCM [phone] POD#3 L HIP HEMI.PART1 CIR EVAL.CIR REHAB COORDIN FOLLOWING FOR INSURANCE AUTH.AWAIT RECOMMENDATIONS.SW FOLLOWING FOR SNF.  04/17/13 Mercedes Valeriano RN,BSN NCM [phone] ORTHO CONS FOR L HIP HEMI.

## 2013-04-21 NOTE — Progress Notes (Signed)
Patient ID: Rodney Potts, male   DOB: [birth year], 68 y.o.   MRN: [id number] Subjective: 4 Days Post-Op Procedure(s) (LRB): ARTHROPLASTY BIPOLAR HIP (Left)    Patient reports pain as mild but reports limited activity  Objective:   VITALS:   Filed Vitals:   04/21/13 0514  BP: 128/70  Pulse: 98  Temp: 99.2 F (37.3 C)  Resp: 18    Intact pulses distally Incision: dressing C/D/I Neuro exam stable from pre-operative history of stroke  LABS  Recent Labs  04/19/13 0555 04/20/13 0445 04/21/13 0355  HGB 9.4* 8.0* 8.5*  HCT 28.7* 24.2* 26.7*  WBC 9.5 8.7 10.7*  PLT 168 154 207     Recent Labs  04/19/13 0555 04/20/13 0445 04/21/13 0355  NA 137 140 138  K 3.7 3.4* 4.0  BUN 12 17 16   CREATININE 0.89 0.88 0.88  GLUCOSE 113* 97 104*    No results found for this basename: LABPT, INR,  in the last 72 hours   Assessment/Plan: 4 Days Post-Op Procedure(s) (LRB): ARTHROPLASTY BIPOLAR HIP (Left)   Up with therapy [facility] weeks Call with questions, [phone]

## 2013-04-21 NOTE — Progress Notes (Addendum)
[facility] PROGRESS NOTE  Rodney Potts NUU:[id number] DOB: 1945-05-24 DOA: 04/16/2013 PCP: Woody Seller, MD  Assessment/Plan: 1. Left femoral neck fracture, status post mechanical fall at home. S/P ORIF, procedure performed by Dr Tonita Cong, no immediate complications. PT consulted.  2. History of seizure disorder. He denies recent seizure activity, will continue Keppra 750 mg by mouth twice a day and phenobarbital 97 mg each bedtime. 3. Probable delirium. Improving today, minimizing polypharmacy and psychropic medications.   4. History of CVA. Plavix being held for surgical intervention by orthopedic surgery. 5. Dyslipidemia. Continue atorvastatin 40 mg by mouth daily 6. Urinary tract infection. Urine cultures growing enterococcus species, will start Levaquin 500 mg by mouth daily for a total of 7 days  Code Status: Full Code Family Communication: I spoke with patient's wife at bedside Disposition Plan: Plan for transition to inpatient rehab vs SNF   Consultants:  Orthopedic surgery   HPI/Subjective: Patient is a pleasant 68 year old gentleman with a past medical history of CVA, currently on Plavix therapy, history of seizure disorder, dyslipidemia who was in his usual state of health when on the evening of 04/16/2013 had a fall after losing his balance while changing clothes. X-ray showed subcapital left femoral neck fracture.   Patient take to OR on 2/03/08/13, undergoing ORIF. He was noted by nursing staff to be sedated after receiving IV morphine yesterday prior PT evaluation. He has had some confusion in the last 24 hours, during my encounter he appears easily distractible, having difficulties with calculation on 04/19/13.   Patient appears stable, he has no complaints. Is awake and alert.      Objective: Filed Vitals:   04/21/13 1522  BP: 118/69  Pulse: 95  Temp: 98.5 F (36.9 C)  Resp: 18    Intake/Output Summary (Last 24 hours) at 04/21/13 1641 Last data  filed at 04/21/13 1522  Gross per 24 hour  Intake   1000 ml  Output   2150 ml  Net  -1150 ml   Filed Weights   04/16/13 2301  Weight: 81.012 kg (178 lb 9.6 oz)    Exam:   General:  Appears well. No acute distress. .  Cardiovascular: Regular rate rhythm normal S1-S2 no murmurs or gallop  Respiratory: Clear to auscultation bilaterally  Abdomen: Soft nontender nondistended  Musculoskeletal: Surgical incision site appears clean, without evidence of hematoma or infection.   Data Reviewed: Basic Metabolic Panel:  Recent Labs Lab 04/17/13 0649 04/18/13 0805 04/19/13 0555 04/20/13 0445 04/21/13 0355  NA 139 135* 137 140 138  K 4.6 4.3 3.7 3.4* 4.0  CL 99 98 100 104 103  CO2 27 26 26 28 26   GLUCOSE 82 91 113* 97 104*  BUN 17 16 12 17 16   CREATININE 1.01 0.94 0.89 0.88 0.88  CALCIUM 9.3 8.4 8.3* 7.7* 7.9*   Liver Function Tests:  Recent Labs Lab 04/17/13 0649  AST 28  ALT 16  ALKPHOS 65  BILITOT 0.5  PROT 7.1  ALBUMIN 3.9   No results found for this basename: LIPASE, AMYLASE,  in the last 168 hours No results found for this basename: AMMONIA,  in the last 168 hours CBC:  Recent Labs Lab 04/16/13 2023 04/17/13 0649 04/18/13 0805 04/19/13 0555 04/20/13 0445 04/21/13 0355  WBC 11.4* 9.1 8.8 9.5 8.7 10.7*  NEUTROABS 9.7* 7.2  --   --   --   --   HGB 13.0 13.6 10.2* 9.4* 8.0* 8.5*  HCT 40.5 40.8 32.4* 28.7* 24.2* 26.7*  MCV 88.4 88.3 89.0 87.2 87.7 87.5  PLT 237 246 187 168 154 207   Cardiac Enzymes: No results found for this basename: CKTOTAL, CKMB, CKMBINDEX, TROPONINI,  in the last 168 hours BNP (last 3 results) No results found for this basename: PROBNP,  in the last 8760 hours CBG: No results found for this basename: GLUCAP,  in the last 168 hours  Recent Results (from the past 240 hour(s))  URINE CULTURE     Status: None   Collection Time    04/17/13 12:05 AM      Result Value Ref Range Status   Specimen Description URINE, CLEAN CATCH    Final   Special Requests NONE   Final   Culture  Setup Time     Final   Value: 04/17/2013 09:22     Performed at [place]     Final   Value: >=100,000 COLONIES/ML     Performed at [facility]   Culture     Final   Value: ENTEROCOCCUS SPECIES     Performed at [facility]   Report Status 04/19/2013 FINAL   Final   Organism ID, Bacteria ENTEROCOCCUS SPECIES   Final     Studies: No results found.  Scheduled Meds: . acetaminophen  650 mg Oral TID  . atorvastatin  40 mg Oral QHS  . clopidogrel  75 mg Oral QAC breakfast  . docusate sodium  100 mg Oral BID  . donepezil  10 mg Oral QHS  . ferrous sulfate  325 mg Oral TID PC  . fesoterodine  8 mg Oral Daily  . fluticasone  2 spray Each Nare Daily  . levETIRAcetam  750 mg Oral Q12H  . levofloxacin  500 mg Oral Daily  . loratadine  10 mg Oral Daily  . ondansetron (ZOFRAN) IV  4 mg Intravenous Once  . PHENobarbital  97.2 mg Oral QHS  . sodium chloride  3 mL Intravenous Q12H   Continuous Infusions: . sodium chloride Stopped (04/21/13 1100)    Principal Problem:   Femur fracture, left Active Problems:   UTI (urinary tract infection)   Acute delirium   CVA (cerebral infarction)   Seizures   Glioma of brain   Difficulty walking   Femur fracture    Time spent: 35 minutes    [name]  [facility] Pager [phone]. If 7PM-7AM, please contact night-coverage at [email or web address], password [facility] 04/21/2013, 4:41 PM  LOS: 5 days

## 2013-04-22 ENCOUNTER — Inpatient Hospital Stay (HOSPITAL_COMMUNITY): Payer: Medicare Other

## 2013-04-22 DIAGNOSIS — R4789 Other speech disturbances: Secondary | ICD-10-CM

## 2013-04-22 DIAGNOSIS — G459 Transient cerebral ischemic attack, unspecified: Secondary | ICD-10-CM

## 2013-04-22 DIAGNOSIS — R4701 Aphasia: Secondary | ICD-10-CM

## 2013-04-22 DIAGNOSIS — R262 Difficulty in walking, not elsewhere classified: Secondary | ICD-10-CM

## 2013-04-22 LAB — COMPREHENSIVE METABOLIC PANEL
ALBUMIN: 2.5 g/dL — AB (ref 3.5–5.2)
ALT: 24 U/L (ref 0–53)
AST: 30 U/L (ref 0–37)
Alkaline Phosphatase: 59 U/L (ref 39–117)
BILIRUBIN TOTAL: 0.6 mg/dL (ref 0.3–1.2)
BUN: 17 mg/dL (ref 6–23)
CHLORIDE: 96 meq/L (ref 96–112)
CO2: 28 mEq/L (ref 19–32)
CREATININE: 0.85 mg/dL (ref 0.50–1.35)
Calcium: 8.8 mg/dL (ref 8.4–10.5)
GFR calc Af Amer: >90 mL/min (ref >90)
GFR calc non Af Amer: 89 mL/min — ABNORMAL LOW (ref >90)
Glucose, Bld: 120 mg/dL — ABNORMAL HIGH (ref 70–99)
Potassium: 3.9 mEq/L (ref 3.7–5.3)
Sodium: 135 mEq/L — ABNORMAL LOW (ref 137–147)
TOTAL PROTEIN: 6 g/dL (ref 6.0–8.3)

## 2013-04-22 LAB — PROTIME-INR
INR: 1.05 (ref 0.00–1.49)
PROTHROMBIN TIME: 13.5 s (ref 11.6–15.2)

## 2013-04-22 LAB — GLUCOSE, CAPILLARY: Glucose-Capillary: 123 mg/dL — ABNORMAL HIGH (ref 70–99)

## 2013-04-22 LAB — CBC
HCT: 27.9 % — ABNORMAL LOW (ref 39.0–52.0)
Hemoglobin: 9 g/dL — ABNORMAL LOW (ref 13.0–17.0)
MCH: 28 pg (ref 26.0–34.0)
MCHC: 32.3 g/dL (ref 30.0–36.0)
MCV: 86.6 fL (ref 78.0–100.0)
PLATELETS: 252 10*3/uL (ref 150–400)
RBC: 3.22 MIL/uL — AB (ref 4.22–5.81)
RDW: 14.9 % (ref 11.5–15.5)
WBC: 9.8 10*3/uL (ref 4.0–10.5)

## 2013-04-22 LAB — TROPONIN I

## 2013-04-22 LAB — APTT: APTT: 33 s (ref 24–37)

## 2013-04-22 MED ORDER — IOHEXOL 350 MG/ML SOLN
100.0000 mL | Freq: Once | INTRAVENOUS | Status: AC | PRN
  Administered 2013-04-22: 100 mL via INTRAVENOUS

## 2013-04-22 MED ORDER — AMPICILLIN 250 MG PO CAPS
250.0000 mg | ORAL_CAPSULE | Freq: Four times a day (QID) | ORAL | Status: DC
  Administered 2013-04-23 – 2013-04-24 (×7): 250 mg via ORAL
  Filled 2013-04-22 (×11): qty 1

## 2013-04-22 MED ORDER — SODIUM CHLORIDE 0.9 % IV BOLUS (SEPSIS)
250.0000 mL | Freq: Once | INTRAVENOUS | Status: AC
  Administered 2013-04-22: 250 mL via INTRAVENOUS

## 2013-04-22 NOTE — Progress Notes (Signed)
CSW continues to follow in case CIR does not work out.  Carrisa Keller C. [place] MSW, [place]

## 2013-04-22 NOTE — Significant Event (Signed)
Rapid Response Event Note  Overview:  Code stroke initiated      Initial Focused Assessment: Neuro assessment, Pt unable to state name/ speak initially but follows commands by smiling, grips and extending arms.   Interventions: Stat CT scan and labs, CBG, Dr Earlie Counts at bedside calling neuro consult, see orders. IV started.   Event Summary: pending   at      at          [facility], Hilario Quar[place]

## 2013-04-22 NOTE — Progress Notes (Signed)
Physical Therapy Treatment Patient Details Name: Rodney Potts MRN: [id number] DOB: 10/17/1945 Today's Date: 04/22/2013 Time: 2440-1027 PT Time Calculation (min): 28 min  PT Assessment / Plan / Recommendation  History of Present Illness Rodney Potts is a 68 y.o. 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  Pt unable to stand fully erect without total assist (+2) and felt unable to safely pivot today so moved [facility] and then recliner behind pt.  Spouse reports pt has been performing exercises so reviewed exercises with pt and spouse reminding both about performing within precautions (as pt tends to allow LE into internal rotation).  Also recommended leaving pillow between LEs to maintain precautions.   Follow Up Recommendations  CIR     Does the patient have the potential to tolerate intense rehabilitation     Barriers to Discharge        Equipment Recommendations  None recommended by PT    Recommendations for Other Services    Frequency Min 3X/week   Progress towards PT Goals Progress towards PT goals: Progressing toward goals  Plan Current plan remains appropriate    Precautions / Restrictions Precautions Precautions: Posterior Hip;Fall Precaution Booklet Issued: Yes (comment) Precaution Comments: Reviewed posterior precautions and WBAT status.  Restrictions Weight Bearing Restrictions: Yes LLE Weight Bearing: Weight bearing as tolerated   Pertinent Vitals/Pain Pt did not c/o pain, repositioned to comfort in recliner    Mobility  Bed Mobility Overal bed mobility: Needs Assistance Bed Mobility: Supine to Sit Supine to sit: +2 for physical assistance;+2 for  safety/equipment;Mod assist General bed mobility comments: multi-modal cues for sequencing and facilitation of safe techniques. Increased time. Assist for L LE and scooting to EOB Transfers Overall transfer level: Needs assistance Equipment used: Rolling walker (2 wheeled) Transfers: Sit to/from Stand Sit to Stand: +2 safety/equipment;+2 physical assistance;Total assist General transfer comment: Assist to rise, stabilize, control descent. Performed 5x for transfers, strengthening, technique. Pt unable to stand fully erect despite multimodal cues therefore requiring complete assist to remain standing, spouse held RW and therapy blocked pt's feet from sliding yet pt still unable to perform full trunk and hip extension therefore did not pivot but moved bed to place [facility] and then recliner (nsg tech assisted with hygiene)    Exercises General Exercises - Lower Extremity Ankle Circles/Pumps: AROM;10 reps;Seated;Both Quad Sets: AROM;10 reps;Both Long Arc Quad: AROM;10 reps;Seated;Limitations;Left Long Arc Quad Limitations: unable to achieve full extension Heel Slides: AAROM;Left;10 reps Hip ABduction/ADduction: AAROM;Left;10 reps Shoulder Exercises Shoulder Flexion: AROM;Strengthening;Theraband;10 reps Theraband Level (Shoulder Flexion): Level 2 (Red) Elbow Flexion: AROM;Strengthening;10 reps Elbow Extension: AROM;Strengthening;10 reps   PT Diagnosis:    PT Problem List:   PT Treatment Interventions:     PT Goals (current goals can now be found in the care plan section)    Visit Information  Last PT Received On: 04/22/13 Assistance Needed: +2 History of Present Illness: Rodney Potts is a 68 y.o. 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, recent TIA, prior CVA, history of seizure, prostate cancer; He fell prior to thsi adm resulting in hip fx and underwent  L hip hemiarthroplasty 04/17/13    Subjective Data  Cognition        Balance     End of Session PT - End of Session Equipment Utilized During Treatment: Gait belt Activity Tolerance: Patient limited by fatigue Patient left: in chair;with call bell/phone within reach;with family/visitor present Nurse Communication:  (maximove on white board in room, nsg tech aware lift pad under pt)   GP     Freja Faro,KATHrine E 04/22/2013, 12:33 PM Zenovia Jarred, PT, DPT 04/22/2013 Pager: [phone]

## 2013-04-22 NOTE — Progress Notes (Signed)
Occupational Therapy Treatment Patient Details Name: Rodney Potts MRN: [id number] DOB: 04/29/1945 Today's Date: 04/22/2013 Time: 7106-2694 OT Time Calculation (min): 22 min  OT Assessment / Plan / Recommendation     OT comments  Goal added for BUE exercise to increase I with transfers ( sit to stand)                       Precautions / Restrictions Precautions Precautions: Posterior Hip;Fall Precaution Booklet Issued: Yes (comment) Precaution Comments: Reviewed posterior precautions and WBAT status.  Restrictions Weight Bearing Restrictions: Yes LLE Weight Bearing: Weight bearing as tolerated     OT Goals(current goals can now be found in the care plan section)    Visit Information  Last OT Received On: 04/22/13                Exercises  Shoulder Exercises Shoulder Flexion: AROM;Strengthening;Theraband;10 reps Theraband Level (Shoulder Flexion): Level 2 (Red) Elbow Flexion: AROM;Strengthening;10 reps Elbow Extension: AROM;Strengthening;10 reps      End of Session OT - End of Session Activity Tolerance: Patient tolerated treatment well Patient left: in bed;with call bell/phone within reach;with family/visitor present  [place], Thereasa Parkin 04/22/2013, 12:07 PM

## 2013-04-22 NOTE — Consult Note (Signed)
Referring Physician: Dr. Coralyn Pear    Chief Complaint: TIAs.  HPI: Rodney Potts is an 68 y.o. male history of brain tumor resection, seizure disorder,stroke and TIAs as well as seizure disorder, who was admitted on 04/16/2013 followingacute fracture of left femur. Patient underwent surgical repair and postop course was unremarkable until today at about 1245 when he developed sudden onset of slurred speech and left upper extremity weakness. Deficits cleared after several minutes. CT scan of his head showed no acute intracranial abnormality. Patient subsequently developed expressive aphasia at about 3:50 PM. His deficit also subsequently resolved. CTA of the head and neck was performed which showed severe stenosis of right and left PCA's, but was otherwise unremarkable. He's been taking Plavix daily.he was transferred from [facility] to [facility] stepdown unit for further management. NIH stroke score at this point is 1 facial asymmetry indicative of possible mild left lower facial weakness.  LSN: 3:50 PM on 04/22/2013 tPA Given: No: rapid resolution of deficits MRankin: 1  Past Medical History  Diagnosis Date  . Brain tumor     glioma; S/P radiation and chemotherapy (01/14/2013)  . TIA (transient ischemic attack) ~ 2011  . IBS (irritable bowel syndrome)   . Shingles   . CVA (cerebral infarction)     R thalamic  . Allergy   . Complication of anesthesia     "after knee OR woke up very wild; pulling things out, etc" (01/14/2013)  . High cholesterol   . Sleep apnea     "corrected w/OR" (01/14/2013)  . Seizures     "hx from the scar tissue where brain tumor was; on RX to control" (01/14/2013)  . Stroke     "he's had a couple; still a little weaker on the left leg, drags that foot" (01/14/2013)  . Prostate cancer     Family History  Problem Relation Age of Onset  . Hypertension Father   . Transient ischemic attack Mother   . Cancer Mother      Medications: I have reviewed the  patient's current medications.  ROS: History obtained from spouse and the patient  General ROS: negative for - chills, fatigue, fever, night sweats, weight gain or weight loss Psychological ROS: negative for - behavioral disorder, hallucinations, memory difficulties, mood swings or suicidal ideation Ophthalmic ROS: negative for - blurry vision, double vision, eye pain or loss of vision ENT ROS: negative for - epistaxis, nasal discharge, oral lesions, sore throat, tinnitus or vertigo Allergy and Immunology ROS: negative for - hives or itchy/watery eyes Hematological and Lymphatic ROS: negative for - bleeding problems, bruising or swollen lymph nodes Endocrine ROS: negative for - galactorrhea, hair pattern changes, polydipsia/polyuria or temperature intolerance Respiratory ROS: negative for - cough, hemoptysis, shortness of breath or wheezing Cardiovascular ROS: negative for - chest pain, dyspnea on exertion, edema or irregular heartbeat Gastrointestinal ROS: negative for - abdominal pain, diarrhea, hematemesis, nausea/vomiting or stool incontinence Genito-Urinary ROS: negative for - dysuria, hematuria, incontinence or urinary frequency/urgency Musculoskeletal ROS: acute femur fracture and surgical repair Neurological ROS: as noted in HPI Dermatological ROS: negative for rash and skin lesion changes  Physical Examination: Blood pressure 144/79, pulse 87, temperature 98.9 F (37.2 C), temperature source Oral, resp. rate 20, height 5\' 9"  (1.753 m), weight 85.8 kg (189 lb 2.5 oz), SpO2 96.00%.  Neurologic Examination: Mental Status: Alert, oriented, thought content appropriate.  Speech fluent without evidence of aphasia. Able to follow commands without difficulty. Cranial Nerves: II-Visual fields were normal. III/IV/VI-Pupils were equal  and reacted. Extraocular movements were full and conjugate.    V/VII-no facial numbness; mild left lower facial weakness. VIII-normal. X-normal speech and  symmetrical palatal movement. Motor: 5/5 of right and left upper extremities and right lower extremity; proximal strength of left lower extremity was difficult to assess because of pain and guarding. Sensory: Normal throughout. Deep Tendon Reflexes: absent in lower extremities. Plantars: mute bilaterally Cerebellar: Normal finger-to-nose testing. Carotid auscultation: Normal  Ct Angio Head W/cm &/or Wo Cm  04/22/2013   CLINICAL DATA:  Altered mental status, confusion. History of brain tumor. Slurred speech with right-sided weakness.  EXAM: CT ANGIOGRAPHY HEAD AND NECK  TECHNIQUE: Multidetector CT imaging of the head and neck was performed using the standard protocol during bolus administration of intravenous contrast. Multiplanar CT image reconstructions and MIPs were obtained to evaluate the vascular anatomy. Carotid stenosis measurements (when applicable) are obtained utilizing NASCET criteria, using the distal internal carotid diameter as the denominator.  CONTRAST:  132mL OMNIPAQUE IOHEXOL 350 MG/ML SOLN  COMPARISON:  Head CT 04/22/2013 at 1308 hr. Brain MRI 02/18/2013. Head MRA 06/08/2012. Neck MRA 06/09/2011.  FINDINGS: CTA HEAD FINDINGS  Postcontrast head CT demonstrates stable, moderate cerebral atrophy. Inferior right frontal lobe encephalomalacia with coarse calcification is unchanged. Additional periventricular white matter hypodensities are unchanged and compatible with moderate chronic small vessel ischemic disease. There is no evidence of acute cortical infarct, midline shift, intracranial hemorrhage, or extra-axial fluid collection. No abnormal parenchymal or meningeal enhancement is identified. Prior bilateral cataract surgery is noted.  The distal vertebral arteries are patent. PICA origins are not well visualized. Right AICA origin is patent. SCA origins are patent. Basilar artery is patent without stenosis. There is severe focal stenosis of the left PCA near the P1-P2 junction, slightly  worsened compared to prior MRA. There is severe stenosis of the proximal P2 segment on the right, likely unchanged. Mild distal PCA branch vessel irregularity is again seen. Posterior communicating arteries are not definitely identified.  There is mild bilateral carotid siphon atherosclerotic calcification. Internal carotid arteries are patent from skullbase to carotid termini. ACAs and MCAs are unremarkable. A small anterior communicating artery is identified. No intracranial aneurysm is seen.  Review of the MIP images confirms the above findings.  CTA NECK FINDINGS  Three vessel aortic arch. Subclavian arteries are unremarkable. Evaluation of the proximal right common carotid artery is mildly limited by streak artifact from adjacent dense venous contrast. The remainder of the right common carotid artery and entirety of the left common carotid artery are unremarkable without evidence of stenosis. Mild atherosclerotic calcification is present involving the proximal cervical internal carotid arteries bilaterally without evidence of stenosis. The proximal left internal carotid artery is mildly bulbous in appearance.  Both vertebral arteries are patent without evidence of stenosis. Left vertebral artery is slightly larger than the right. Visualized soft tissues of the neck are grossly unremarkable. Visualized lung apices are clear. Mild degenerative disc disease is noted at C5-6, and mild multilevel cervical facet arthrosis is noted.  Review of the MIP images confirms the above findings.  IMPRESSION: 1. 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 Unremarkable appearance of the anterior circulation. 2. Mild cervical carotid atherosclerosis without evidence of significant stenosis. 3. No evidence of acute intracranial abnormality.   Electronically Signed   By: Logan Bores   On: 04/22/2013 14:15   Ct Head Wo Contrast  04/22/2013   CLINICAL DATA:  Altered level  of consciousness  with confusion  EXAM: CT HEAD WITHOUT CONTRAST  TECHNIQUE: Contiguous axial images were obtained from the base of the skull through the vertex without intravenous contrast.  COMPARISON:  MR HEAD W/O CM dated 02/18/2013; CT HEAD W/O CM dated 02/18/2013  FINDINGS: There is moderate diffuse cerebral and cerebellar atrophy with compensatory ventriculomegaly. There is stable calcification along the inferior aspect of the right frontal lobe demonstrated on images 9 and 10. There is no shift of the midline. There is punctate basal ganglia calcification on the left which is stable. There is stable decreased density in the deep white matter of both cerebral hemispheres consistent with chronic small vessel ischemia. There is no evidence of an acute ischemic or hemorrhagic infarction. The cerebellum and brainstem exhibit no acute abnormalities.  At bone window settings the observed portions of the paranasal sinuses and mastoid air cells are clear. There is no evidence of an acute skull fracture.  IMPRESSION: 1. There is no evidence of acute ischemic or hemorrhagic infarction. There is no intracranial mass effect. 2. There is stable moderate diffuse cerebral and cerebellar atrophy with compensatory ventriculomegaly most compatible with chronic small vessel ischemia.   Electronically Signed   By: David  Martinique   On: 04/22/2013 13:13   Ct Angio Neck W/cm &/or Wo/cm  04/22/2013   CLINICAL DATA:  Altered mental status, confusion. History of brain tumor. Slurred speech with right-sided weakness.  EXAM: CT ANGIOGRAPHY HEAD AND NECK  TECHNIQUE: Multidetector CT imaging of the head and neck was performed using the standard protocol during bolus administration of intravenous contrast. Multiplanar CT image reconstructions and MIPs were obtained to evaluate the vascular anatomy. Carotid stenosis measurements (when applicable) are obtained utilizing NASCET criteria, using the distal internal carotid diameter as the  denominator.  CONTRAST:  182mL OMNIPAQUE IOHEXOL 350 MG/ML SOLN  COMPARISON:  Head CT 04/22/2013 at 1308 hr. Brain MRI 02/18/2013. Head MRA 06/08/2012. Neck MRA 06/09/2011.  FINDINGS: CTA HEAD FINDINGS  Postcontrast head CT demonstrates stable, moderate cerebral atrophy. Inferior right frontal lobe encephalomalacia with coarse calcification is unchanged. Additional periventricular white matter hypodensities are unchanged and compatible with moderate chronic small vessel ischemic disease. There is no evidence of acute cortical infarct, midline shift, intracranial hemorrhage, or extra-axial fluid collection. No abnormal parenchymal or meningeal enhancement is identified. Prior bilateral cataract surgery is noted.  The distal vertebral arteries are patent. PICA origins are not well visualized. Right AICA origin is patent. SCA origins are patent. Basilar artery is patent without stenosis. There is severe focal stenosis of the left PCA near the P1-P2 junction, slightly worsened compared to prior MRA. There is severe stenosis of the proximal P2 segment on the right, likely unchanged. Mild distal PCA branch vessel irregularity is again seen. Posterior communicating arteries are not definitely identified.  There is mild bilateral carotid siphon atherosclerotic calcification. Internal carotid arteries are patent from skullbase to carotid termini. ACAs and MCAs are unremarkable. A small anterior communicating artery is identified. No intracranial aneurysm is seen.  Review of the MIP images confirms the above findings.  CTA NECK FINDINGS  Three vessel aortic arch. Subclavian arteries are unremarkable. Evaluation of the proximal right common carotid artery is mildly limited by streak artifact from adjacent dense venous contrast. The remainder of the right common carotid artery and entirety of the left common carotid artery are unremarkable without evidence of stenosis. Mild atherosclerotic calcification is present involving the  proximal cervical internal carotid arteries bilaterally without evidence of stenosis. The proximal left internal carotid  artery is mildly bulbous in appearance.  Both vertebral arteries are patent without evidence of stenosis. Left vertebral artery is slightly larger than the right. Visualized soft tissues of the neck are grossly unremarkable. Visualized lung apices are clear. Mild degenerative disc disease is noted at C5-6, and mild multilevel cervical facet arthrosis is noted.  Review of the MIP images confirms the above findings.  IMPRESSION: 1. 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 Unremarkable appearance of the anterior circulation. 2. Mild cervical carotid atherosclerosis without evidence of significant stenosis. 3. No evidence of acute intracranial abnormality.   Electronically Signed   By: Logan Bores   On: 04/22/2013 14:15    Assessment: 68 y.o. male with significant risk factors for stroke as well as history of stroke and TIA presenting with multiple transient ischemic attacks involving right and left MCA territories, indicative of possible embolic phenomena. Small right subcortical ischemic stroke cannot be ruled out, particularly with residual mild left facial weakness.  Stroke Risk Factors - hyperlipidemia  Plan: 1. HgbA1c, fasting lipid panel 2. MRI, MRA  of the brain without contrast 3. PT consult, OT consult, Speech consult 4. Echocardiogram 5. Carotid dopplers 6. Prophylactic therapy-Antiplatelet med: Plavix 7. Risk factor modification 8. Telemetry monitoring   C.R. Nicole Kindred, MD [facility] [phone]  04/22/2013, 7:46 PM

## 2013-04-22 NOTE — Progress Notes (Signed)
Rehab admissions - I have received a denial for acute inpatient rehab admission from [facility].  I will not be able to admit patient to inpatient rehab.  Options are SNF at this point.  Call me for questions.  #[phone]

## 2013-04-22 NOTE — Progress Notes (Signed)
Called to bedside. At 1245pm, pt noted to have spontaneous slurred speech with R sided weakness, decreased grip strength on R, slurred speech, and facial droop. Roughly 10 min after onset, pt's above symptoms appeared somewhat improved, albeit still weak compared to this AM's baseline. Stat labs ordered. Pt for STAT head CT. Code stroke called. Discussed case with Neurology with recommendations for added STAT CTA head and neck - ordered. Will follow.

## 2013-04-22 NOTE — Progress Notes (Signed)
Pt's above symptoms had much improved since previous progress note and pt was conversant and near baseline. However at 350pm, the patient was noted by staff to have acute word finding difficulties. Discussed with Neurology with recommendations for IVF bolus and transfer to stepdown of [facility] for closer monitoring. Will transfer.

## 2013-04-22 NOTE — Progress Notes (Signed)
[employer] Denied CIR. Met with patient and spouse at bedside. Discussed denial. Spouse is very disappointed and doesn't understand. She would like patient to go to [facility] if snf is the route we must go. It is possible patient had a CVA today per patient spouse so we will wait to see when medically ready.  Ranjit Ashurst C. Idalia MSW, [place]

## 2013-04-22 NOTE — Progress Notes (Signed)
[facility] PROGRESS NOTE  Daeron Aardema Harshberger ZOX:[id number] DOB: 1945-07-25 DOA: 04/16/2013 PCP: Pamelia Hoit, MD  Assessment/Plan: 1. Left femoral neck fracture, status post mechanical fall at home. S/P ORIF, procedure performed by Dr Shelle Iron, no immediate complications. PT consulted. Recs for CIR  2. History of seizure disorder. He denies recent seizure activity, continued Keppra 750 mg by mouth twice a day and phenobarbital 97 mg each bedtime. 3. Probable delirium. Improving today, minimizing polypharmacy and psychropic medications.   4. History of CVA. Plavix resumed and used for DVT prophylaxis per Ortho. 5. Dyslipidemia. Continue atorvastatin 40 mg by mouth daily 6. Urinary tract infection. Urine cultures growing enterococcus species, started Levaquin 500 mg by mouth daily for a total of 7 days  Code Status: Full Code Family Communication: I spoke with patient's wife at bedside Disposition Plan: Plan for transition to inpatient rehab vs SNF   Consultants:  Orthopedic surgery   HPI/Subjective: Patient is a pleasant 68 year old gentleman with a past medical history of CVA, currently on Plavix therapy, history of seizure disorder, dyslipidemia who was in his usual state of health when on the evening of 04/16/2013 had a fall after losing his balance while changing clothes. X-ray showed subcapital left femoral neck fracture.   Patient take to OR on 2/03/08/13, undergoing ORIF. He was noted by nursing staff to be sedated after receiving IV morphine yesterday prior PT evaluation. He has had some confusion in the last 24 hours, during my encounter he appears easily distractible, having difficulties with calculation on 04/19/13.   No acute events overnight. Eager to go to [facility].   Objective: Filed Vitals:   04/22/13 0605  BP: 131/82  Pulse: 87  Temp: 99.2 F (37.3 C)  Resp: 18    Intake/Output Summary (Last 24 hours) at 04/22/13 0952 Last data filed at 04/22/13 0810  Gross  per 24 hour  Intake 1339.17 ml  Output   2225 ml  Net -885.83 ml   Filed Weights   04/16/13 2301  Weight: 81.012 kg (178 lb 9.6 oz)    Exam:   General:  Appears well. No acute distress. .  Cardiovascular: Regular rate rhythm normal S1-S2 no murmurs or gallop  Respiratory: Clear to auscultation bilaterally  Abdomen: Soft nontender nondistended  Musculoskeletal: Surgical incision site appears clean, without evidence of hematoma or infection.   Data Reviewed: Basic Metabolic Panel:  Recent Labs Lab 04/17/13 0649 04/18/13 0805 04/19/13 0555 04/20/13 0445 04/21/13 0355  NA 139 135* 137 140 138  K 4.6 4.3 3.7 3.4* 4.0  CL 99 98 100 104 103  CO2 27 26 26 28 26   GLUCOSE 82 91 113* 97 104*  BUN 17 16 12 17 16   CREATININE 1.01 0.94 0.89 0.88 0.88  CALCIUM 9.3 8.4 8.3* 7.7* 7.9*   Liver Function Tests:  Recent Labs Lab 04/17/13 0649  AST 28  ALT 16  ALKPHOS 65  BILITOT 0.5  PROT 7.1  ALBUMIN 3.9   No results found for this basename: LIPASE, AMYLASE,  in the last 168 hours No results found for this basename: AMMONIA,  in the last 168 hours CBC:  Recent Labs Lab 04/16/13 2023 04/17/13 0649 04/18/13 0805 04/19/13 0555 04/20/13 0445 04/21/13 0355  WBC 11.4* 9.1 8.8 9.5 8.7 10.7*  NEUTROABS 9.7* 7.2  --   --   --   --   HGB 13.0 13.6 10.2* 9.4* 8.0* 8.5*  HCT 40.5 40.8 32.4* 28.7* 24.2* 26.7*  MCV 88.4 88.3 89.0 87.2 87.7 87.5  PLT 237 246 187 168 154 207   Cardiac Enzymes: No results found for this basename: CKTOTAL, CKMB, CKMBINDEX, TROPONINI,  in the last 168 hours BNP (last 3 results) No results found for this basename: PROBNP,  in the last 8760 hours CBG: No results found for this basename: GLUCAP,  in the last 168 hours  Recent Results (from the past 240 hour(s))  URINE CULTURE     Status: None   Collection Time    04/17/13 12:05 AM      Result Value Ref Range Status   Specimen Description URINE, CLEAN CATCH   Final   Special Requests NONE    Final   Culture  Setup Time     Final   Value: 04/17/2013 09:22     Performed at [facility] Count     Final   Value: >=100,000 COLONIES/ML     Performed at [facility]   Culture     Final   Value: ENTEROCOCCUS SPECIES     Performed at [facility]   Report Status 04/19/2013 FINAL   Final   Organism ID, Bacteria ENTEROCOCCUS SPECIES   Final     Studies: No results found.  Scheduled Meds: . acetaminophen  650 mg Oral TID  . atorvastatin  40 mg Oral QHS  . clopidogrel  75 mg Oral QAC breakfast  . docusate sodium  100 mg Oral BID  . donepezil  10 mg Oral QHS  . ferrous sulfate  325 mg Oral TID PC  . fesoterodine  8 mg Oral Daily  . fluticasone  2 spray Each Nare Daily  . levETIRAcetam  750 mg Oral Q12H  . levofloxacin  500 mg Oral Daily  . loratadine  10 mg Oral Daily  . ondansetron (ZOFRAN) IV  4 mg Intravenous Once  . PHENobarbital  97.2 mg Oral QHS  . sodium chloride  3 mL Intravenous Q12H   Continuous Infusions:    Principal Problem:   Femur fracture, left Active Problems:   CVA (cerebral infarction)   Seizures   Glioma of brain   Difficulty walking   Femur fracture   UTI (urinary tract infection)   Acute delirium  Time spent: 35 minutes  Knoxx Boeding K  [facility] Pager [phone]. If 7PM-7AM, please contact night-coverage at [email or web address], password [facility] 04/22/2013, 9:52 AM  LOS: 6 days

## 2013-04-22 NOTE — Progress Notes (Signed)
Stroke swallow screen completed by Jearld Lesch RN. Lung sounds clear prior to swallow screen. Pt reports hx of dysphagia after prior stroke, but was resolved and pt able to resume regular diet. At this time, pt able to cough on command, lick top and bottom lip, swallow water from cup, swallow water through straw, and swallow cracker without difficulty. Lung sounds clear on completion of swallow screen.

## 2013-04-22 NOTE — Progress Notes (Signed)
Called report to [employer], Therapist, sports. [facility] has been called and patient will be transporting to 3S13C at [facility]. J.Zappa, RN

## 2013-04-22 NOTE — Progress Notes (Signed)
RN called to patients room. Wife stated patient had an episode of slurred speech and right arm went "limp"  while pt was eating lunch just minutes prior. MD on floor, arrived at bedside while RN was assessing patient. Orders given and initiated. Will continue to monitor. J.Duy Lemming, RN

## 2013-04-23 ENCOUNTER — Encounter (HOSPITAL_COMMUNITY): Payer: Self-pay | Admitting: Nurse Practitioner

## 2013-04-23 ENCOUNTER — Ambulatory Visit: Payer: Medicare Other | Admitting: Physical Therapy

## 2013-04-23 ENCOUNTER — Encounter: Payer: Medicare Other | Admitting: Occupational Therapy

## 2013-04-23 ENCOUNTER — Inpatient Hospital Stay (HOSPITAL_COMMUNITY): Payer: Medicare Other

## 2013-04-23 DIAGNOSIS — I6789 Other cerebrovascular disease: Secondary | ICD-10-CM

## 2013-04-23 LAB — LIPID PANEL
Cholesterol: 136 mg/dL (ref 0–200)
HDL: 39 mg/dL — AB (ref >39)
LDL CALC: 84 mg/dL (ref 0–99)
TRIGLYCERIDES: 65 mg/dL (ref <150)
Total CHOL/HDL Ratio: 3.5 RATIO
VLDL: 13 mg/dL (ref 0–40)

## 2013-04-23 LAB — HEMOGLOBIN A1C
Hgb A1c MFr Bld: 5.3 % (ref <5.7)
Mean Plasma Glucose: 105 mg/dL (ref <117)

## 2013-04-23 LAB — MRSA PCR SCREENING: MRSA by PCR: NEGATIVE

## 2013-04-23 MED ORDER — TRIAMCINOLONE ACETONIDE 0.025 % EX CREA
1.0000 "application " | TOPICAL_CREAM | Freq: Two times a day (BID) | CUTANEOUS | Status: DC
  Administered 2013-04-23 – 2013-04-24 (×2): 1 via TOPICAL
  Filled 2013-04-23: qty 15

## 2013-04-23 NOTE — Progress Notes (Signed)
  Echocardiogram 2D Echocardiogram has been performed.  Rodney Potts 04/23/2013, 2:33 PM

## 2013-04-23 NOTE — Progress Notes (Signed)
Patient transferred to the unit from [place]. Patient alert and oriented, patient with wife at bedside. Bed alarm put in place.

## 2013-04-23 NOTE — Progress Notes (Signed)
Utilization review completed.  

## 2013-04-23 NOTE — Progress Notes (Signed)
Patient being transferred to [place] per MD order. Report called to RN, wife at bedside.

## 2013-04-23 NOTE — Progress Notes (Signed)
Stroke Team Progress Note  HISTORY Rodney Potts is an 68 y.o. male history of brain tumor resection, seizure disorder,stroke and TIAs as well as seizure disorder, who was admitted on 04/16/2013 followingacute fracture of left femur. Patient underwent surgical repair and postop course was unremarkable until today 04/22/2013 at about 1245 when he developed sudden onset of slurred speech and left upper extremity weakness. Deficits cleared after several minutes. CT scan of his head showed no acute intracranial abnormality. Patient subsequently developed expressive aphasia at about 3:50 PM. His deficit also subsequently resolved. CTA of the head and neck was performed which showed severe stenosis of right and left PCA's, but was otherwise unremarkable. He's been taking Plavix daily.he was transferred from [facility] to [facility] stepdown unit for further management. NIH stroke score at this point is 1 facial asymmetry indicative of possible mild left lower facial weakness. Patient was not administerd TPA secondary to rapid resolution of deficits.   SUBJECTIVE His wife is at the bedside.  She is concerned about his condition.  OBJECTIVE Most recent Vital Signs: Filed Vitals:   04/22/13 1920 04/22/13 1928 04/22/13 2315 04/23/13 0345  BP: 144/79 144/79 145/84 131/82  Pulse: 89 84  83  Temp:  98.9 F (37.2 C) 98.1 F (36.7 C) 98 F (36.7 C)  TempSrc:  Oral Oral Oral  Resp:   16 18  Height:  5\' 9"  (1.753 m)    Weight:  85.8 kg (189 lb 2.5 oz)    SpO2:  96%  96%   CBG (last 3)   Recent Labs  04/22/13 1253  GLUCAP 123*    IV Fluid Intake:     MEDICATIONS  . acetaminophen  650 mg Oral TID  . ampicillin  250 mg Oral 4 times per day  . atorvastatin  40 mg Oral QHS  . clopidogrel  75 mg Oral QAC breakfast  . docusate sodium  100 mg Oral BID  . donepezil  10 mg Oral QHS  . ferrous sulfate  325 mg Oral TID PC  . fesoterodine  8 mg Oral Daily  . fluticasone  2 spray Each Nare Daily  .  levETIRAcetam  750 mg Oral Q12H  . loratadine  10 mg Oral Daily  . ondansetron (ZOFRAN) IV  4 mg Intravenous Once  . PHENobarbital  97.2 mg Oral QHS  . sodium chloride  3 mL Intravenous Q12H   PRN:  bisacodyl, chlorpheniramine-HYDROcodone, menthol-cetylpyridinium, methocarbamol, metoCLOPramide (REGLAN) injection, metoCLOPramide, naphazoline, ondansetron (ZOFRAN) IV, phenol, polyethylene glycol, sodium chloride  Diet:  General thin liquids Activity:   DVT Prophylaxis:  SCDs   CLINICALLY SIGNIFICANT STUDIES Basic Metabolic Panel:  Recent Labs Lab 04/21/13 0355 04/22/13 1323  NA 138 135*  K 4.0 3.9  CL 103 96  CO2 26 28  GLUCOSE 104* 120*  BUN 16 17  CREATININE 0.88 0.85  CALCIUM 7.9* 8.8   Liver Function Tests:  Recent Labs Lab 04/17/13 0649 04/22/13 1323  AST 28 30  ALT 16 24  ALKPHOS 65 59  BILITOT 0.5 0.6  PROT 7.1 6.0  ALBUMIN 3.9 2.5*   CBC:  Recent Labs Lab 04/16/13 2023 04/17/13 0649  04/21/13 0355 04/22/13 1323  WBC 11.4* 9.1  < > 10.7* 9.8  NEUTROABS 9.7* 7.2  --   --   --   HGB 13.0 13.6  < > 8.5* 9.0*  HCT 40.5 40.8  < > 26.7* 27.9*  MCV 88.4 88.3  < > 87.5 86.6  PLT 237 246  < >  207 252  < > = values in this interval not displayed. Coagulation:  Recent Labs Lab 04/16/13 2023 04/22/13 1330  LABPROT 13.4 13.5  INR 1.04 1.05   Cardiac Enzymes:  Recent Labs Lab 04/22/13 1323  TROPONINI <0.30   Urinalysis:  Recent Labs Lab 04/17/13 0005  COLORURINE YELLOW  LABSPEC 1.014  PHURINE 7.0  GLUCOSEU NEGATIVE  HGBUR LARGE*  BILIRUBINUR NEGATIVE  KETONESUR NEGATIVE  PROTEINUR NEGATIVE  UROBILINOGEN 0.2  NITRITE NEGATIVE  LEUKOCYTESUR TRACE*   Lipid Panel    Component Value Date/Time   CHOL 114 01/16/2013 0436   TRIG 49 01/16/2013 0436   HDL 54 01/16/2013 0436   CHOLHDL 2.1 01/16/2013 0436   VLDL 10 01/16/2013 0436   LDLCALC 50 01/16/2013 0436   HgbA1C  Lab Results  Component Value Date   HGBA1C 5.5 06/08/2012    Urine Drug  Screen:     Component Value Date/Time   LABOPIA NONE DETECTED 06/09/2011 2146   COCAINSCRNUR NONE DETECTED 06/09/2011 2146   LABBENZ NONE DETECTED 06/09/2011 2146   AMPHETMU NONE DETECTED 06/09/2011 2146   THCU NONE DETECTED 06/09/2011 2146   LABBARB POSITIVE* 06/09/2011 2146    Alcohol Level: No results found for this basename: ETH,  in the last 168 hours   CT of the brain  04/22/2013    1. There is no evidence of acute ischemic or hemorrhagic infarction. There is no intracranial mass effect. 2. There is stable moderate diffuse cerebral and cerebellar atrophy with compensatory ventriculomegaly most compatible with chronic small vessel ischemia.     CT angio head and neck 04/22/2013  1. 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 Unremarkable appearance of the anterior circulation. 2. Mild cervical carotid atherosclerosis without evidence of significant stenosis. 3. No evidence of acute intracranial abnormality.     MRI of the brain    MRA of the brain    2D Echocardiogram    Carotid Doppler    CXR  04/16/2013 No active disease.  EKG sinus rhythm with PACs  Therapy Recommendations CIR (pre stroke recommendation)  Physical Exam   Pleasant middle aged Caucasian male not in distress.Awake alert. Afebrile. Head is nontraumatic. Neck is supple without bruit. Hearing is normal. Cardiac exam no murmur or gallop. Lungs are clear to auscultation. Distal pulses are well felt. Neurological Exam : Awake alert oriented x3. Minimal dysarthria. No aphasia. Extraocular moments are full range without nystagmus. Visual acuity seems adequate. Fundi were not visualized. Mild right lower facial weakness. Tongue midline. Motor system exam mild right approximate a drift. Weakness of right grip and intrinsic hand muscles. 4/5 strength the right approximately and 4+/5 strength in the right lower extremity. Normal strength on the left. Coordination is slow but accurate  on the right. Sensation intact. Gait was not tested. ASSESSMENT [name] is a 68 y.o. male presenting during hospitalization with slurred speech and left upper extremity weakness post left femur fracture repair, which resolved, then developed expressive aphasia, which aslo resolved. Imaging confirms a left greater than right PCA stenosis; have not been able to confirm stroke at this time. Unsure if MRI can be completed, will discuss with surgeon.  On clopidogrel 75 mg orally every day prior to admission. Now on clopidogrel 75 mg orally every day for secondary stroke prevention. Work up underway.   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 Will consider for Socrates Trial (use of Brilinta in acute stroke)  Lipids, hemoglobin A1c, 2-D echo and carotid Doppler  Dr. Leonie Man will discuss with Dr. Alvan Dame safety of MRI, MRA in patient, will complete if safe  Burnetta Sabin, MSN, RN, ANVP-BC, ANP-BC, GNP-BC [facility] Pager: [phone] 04/23/2013 8:27 AM  I have personally obtained a history, examined the patient, evaluated imaging results, and formulated the assessment and plan of care. I agree with the above. Antony Contras, MD

## 2013-04-23 NOTE — Progress Notes (Signed)
[facility] PROGRESS NOTE  Brogen Duell Clavel YBO:[id number] DOB: Sep 21, 1945 DOA: 04/16/2013 PCP: Woody Seller, MD  Assessment/Plan:    Femur fracture, left: Status post mechanical fall at home, status post ORIF skilled nursing versus inpatient rehabilitation once stroke workup complete Active Problems:   CVA (cerebral infarction) versus TIA: Already on Plavix which has been since resumed. Workup in progress. Checking MRI, Dopplers, lipid panel, echocardiogram.   Seizures: Continue Keppra   Glioma of brain, status post resection   Difficulty walking    UTI (urinary tract infection): Enterococcus, currently on ampicillin day 3 of 7   Acute delirium: Mostly resolved, minimizing polypharmacy and psychotropic medications.     Code Status: Full  Family Communication: Wife at bedside  Disposition Plan: Stable, so wil tx to Neuro floor   Consultants:  Neuro  Procedures:  Echo pending  Dopplers pending  MRI pending  Antibiotics: Levaquin 2/17-2/18 Ampicillin 2/18-2/23  HPI/Subjective: Patient doing okay. No complaints. By wife, back to baseline  Objective: Filed Vitals:   04/23/13 1200  BP: 103/60  Pulse:   Temp: 98.6 F (37 C)  Resp:     Intake/Output Summary (Last 24 hours) at 04/23/13 1456 Last data filed at 04/23/13 1200  Gross per 24 hour  Intake    543 ml  Output   2450 ml  Net  -1907 ml   Filed Weights   04/16/13 2301 04/22/13 1928  Weight: 81.012 kg (178 lb 9.6 oz) 85.8 kg (189 lb 2.5 oz)    Exam:   General:  Alert and oriented x3, mild cognitive chronic deficits secondary to previous glioma resection and CVA  Cardiovascular: Regular rate and rhythm, S1-S2  Respiratory: Clear auscultation bilaterally  Abdomen: Soft, nontender, nondistended, positive bowel sounds  Musculoskeletal: No clubbing or cyanosis or edema   Neuro:  Data Reviewed: Basic Metabolic Panel:  Recent Labs Lab 04/18/13 0805 04/19/13 0555 04/20/13 0445  04/21/13 0355 04/22/13 1323  NA 135* 137 140 138 135*  K 4.3 3.7 3.4* 4.0 3.9  CL 98 100 104 103 96  CO2 26 26 28 26 28   GLUCOSE 91 113* 97 104* 120*  BUN 16 12 17 16 17   CREATININE 0.94 0.89 0.88 0.88 0.85  CALCIUM 8.4 8.3* 7.7* 7.9* 8.8   Liver Function Tests:  Recent Labs Lab 04/17/13 0649 04/22/13 1323  AST 28 30  ALT 16 24  ALKPHOS 65 59  BILITOT 0.5 0.6  PROT 7.1 6.0  ALBUMIN 3.9 2.5*   No results found for this basename: LIPASE, AMYLASE,  in the last 168 hours No results found for this basename: AMMONIA,  in the last 168 hours CBC:  Recent Labs Lab 04/16/13 2023 04/17/13 0649 04/18/13 0805 04/19/13 0555 04/20/13 0445 04/21/13 0355 04/22/13 1323  WBC 11.4* 9.1 8.8 9.5 8.7 10.7* 9.8  NEUTROABS 9.7* 7.2  --   --   --   --   --   HGB 13.0 13.6 10.2* 9.4* 8.0* 8.5* 9.0*  HCT 40.5 40.8 32.4* 28.7* 24.2* 26.7* 27.9*  MCV 88.4 88.3 89.0 87.2 87.7 87.5 86.6  PLT 237 246 187 168 154 207 252   Cardiac Enzymes:  Recent Labs Lab 04/22/13 1323  TROPONINI <0.30   BNP (last 3 results) No results found for this basename: PROBNP,  in the last 8760 hours CBG:  Recent Labs Lab 04/22/13 1253  GLUCAP 123*    Recent Results (from the past 240 hour(s))  URINE CULTURE     Status: None   Collection  Time    04/17/13 12:05 AM      Result Value Ref Range Status   Specimen Description URINE, CLEAN CATCH   Final   Special Requests NONE   Final   Culture  Setup Time     Final   Value: 04/17/2013 09:22     Performed at [facility] Count     Final   Value: >=100,000 COLONIES/ML     Performed at [facility]   Culture     Final   Value: ENTEROCOCCUS SPECIES     Performed at [facility]   Report Status 04/19/2013 FINAL   Final   Organism ID, Bacteria ENTEROCOCCUS SPECIES   Final  MRSA PCR SCREENING     Status: None   Collection Time    04/23/13  5:08 AM      Result Value Ref Range Status   MRSA by PCR NEGATIVE  NEGATIVE Final    Comment:            The GeneXpert MRSA Assay (FDA     approved for NASAL specimens     only), is one component of a     comprehensive MRSA colonization     surveillance program. It is not     intended to diagnose MRSA     infection nor to guide or     monitor treatment for     MRSA infections.     Studies: Ct Angio Head W/cm &/or Wo Cm  04/22/2013   CLINICAL DATA:  Altered mental status, confusion. History of brain tumor. Slurred speech with right-sided weakness.  EXAM: CT ANGIOGRAPHY HEAD AND NECK  TECHNIQUE: Multidetector CT imaging of the head and neck was performed using the standard protocol during bolus administration of intravenous contrast. Multiplanar CT image reconstructions and MIPs were obtained to evaluate the vascular anatomy. Carotid stenosis measurements (when applicable) are obtained utilizing NASCET criteria, using the distal internal carotid diameter as the denominator.  CONTRAST:  157mL OMNIPAQUE IOHEXOL 350 MG/ML SOLN  COMPARISON:  Head CT 04/22/2013 at 1308 hr. Brain MRI 02/18/2013. Head MRA 06/08/2012. Neck MRA 06/09/2011.  FINDINGS: CTA HEAD FINDINGS  Postcontrast head CT demonstrates stable, moderate cerebral atrophy. Inferior right frontal lobe encephalomalacia with coarse calcification is unchanged. Additional periventricular white matter hypodensities are unchanged and compatible with moderate chronic small vessel ischemic disease. There is no evidence of acute cortical infarct, midline shift, intracranial hemorrhage, or extra-axial fluid collection. No abnormal parenchymal or meningeal enhancement is identified. Prior bilateral cataract surgery is noted.  The distal vertebral arteries are patent. PICA origins are not well visualized. Right AICA origin is patent. SCA origins are patent. Basilar artery is patent without stenosis. There is severe focal stenosis of the left PCA near the P1-P2 junction, slightly worsened compared to prior MRA. There is severe stenosis of the  proximal P2 segment on the right, likely unchanged. Mild distal PCA branch vessel irregularity is again seen. Posterior communicating arteries are not definitely identified.  There is mild bilateral carotid siphon atherosclerotic calcification. Internal carotid arteries are patent from skullbase to carotid termini. ACAs and MCAs are unremarkable. A small anterior communicating artery is identified. No intracranial aneurysm is seen.  Review of the MIP images confirms the above findings.  CTA NECK FINDINGS  Three vessel aortic arch. Subclavian arteries are unremarkable. Evaluation of the proximal right common carotid artery is mildly limited by streak artifact from adjacent dense venous contrast. The remainder of the right common  carotid artery and entirety of the left common carotid artery are unremarkable without evidence of stenosis. Mild atherosclerotic calcification is present involving the proximal cervical internal carotid arteries bilaterally without evidence of stenosis. The proximal left internal carotid artery is mildly bulbous in appearance.  Both vertebral arteries are patent without evidence of stenosis. Left vertebral artery is slightly larger than the right. Visualized soft tissues of the neck are grossly unremarkable. Visualized lung apices are clear. Mild degenerative disc disease is noted at C5-6, and mild multilevel cervical facet arthrosis is noted.  Review of the MIP images confirms the above findings.  IMPRESSION: 1. 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 Unremarkable appearance of the anterior circulation. 2. Mild cervical carotid atherosclerosis without evidence of significant stenosis. 3. No evidence of acute intracranial abnormality.   Electronically Signed   By: Logan Bores   On: 04/22/2013 14:15   Ct Head Wo Contrast  04/22/2013   CLINICAL DATA:  Altered level of consciousness with confusion  EXAM: CT HEAD WITHOUT CONTRAST   TECHNIQUE: Contiguous axial images were obtained from the base of the skull through the vertex without intravenous contrast.  COMPARISON:  MR HEAD W/O CM dated 02/18/2013; CT HEAD W/O CM dated 02/18/2013  FINDINGS: There is moderate diffuse cerebral and cerebellar atrophy with compensatory ventriculomegaly. There is stable calcification along the inferior aspect of the right frontal lobe demonstrated on images 9 and 10. There is no shift of the midline. There is punctate basal ganglia calcification on the left which is stable. There is stable decreased density in the deep white matter of both cerebral hemispheres consistent with chronic small vessel ischemia. There is no evidence of an acute ischemic or hemorrhagic infarction. The cerebellum and brainstem exhibit no acute abnormalities.  At bone window settings the observed portions of the paranasal sinuses and mastoid air cells are clear. There is no evidence of an acute skull fracture.  IMPRESSION: 1. There is no evidence of acute ischemic or hemorrhagic infarction. There is no intracranial mass effect. 2. There is stable moderate diffuse cerebral and cerebellar atrophy with compensatory ventriculomegaly most compatible with chronic small vessel ischemia.   Electronically Signed   By: David  Martinique   On: 04/22/2013 13:13   Ct Angio Neck W/cm &/or Wo/cm  04/22/2013   CLINICAL DATA:  Altered mental status, confusion. History of brain tumor. Slurred speech with right-sided weakness.  EXAM: CT ANGIOGRAPHY HEAD AND NECK  TECHNIQUE: Multidetector CT imaging of the head and neck was performed using the standard protocol during bolus administration of intravenous contrast. Multiplanar CT image reconstructions and MIPs were obtained to evaluate the vascular anatomy. Carotid stenosis measurements (when applicable) are obtained utilizing NASCET criteria, using the distal internal carotid diameter as the denominator.  CONTRAST:  154mL OMNIPAQUE IOHEXOL 350 MG/ML SOLN   COMPARISON:  Head CT 04/22/2013 at 1308 hr. Brain MRI 02/18/2013. Head MRA 06/08/2012. Neck MRA 06/09/2011.  FINDINGS: CTA HEAD FINDINGS  Postcontrast head CT demonstrates stable, moderate cerebral atrophy. Inferior right frontal lobe encephalomalacia with coarse calcification is unchanged. Additional periventricular white matter hypodensities are unchanged and compatible with moderate chronic small vessel ischemic disease. There is no evidence of acute cortical infarct, midline shift, intracranial hemorrhage, or extra-axial fluid collection. No abnormal parenchymal or meningeal enhancement is identified. Prior bilateral cataract surgery is noted.  The distal vertebral arteries are patent. PICA origins are not well visualized. Right AICA origin is patent. SCA origins are patent.  Basilar artery is patent without stenosis. There is severe focal stenosis of the left PCA near the P1-P2 junction, slightly worsened compared to prior MRA. There is severe stenosis of the proximal P2 segment on the right, likely unchanged. Mild distal PCA branch vessel irregularity is again seen. Posterior communicating arteries are not definitely identified.  There is mild bilateral carotid siphon atherosclerotic calcification. Internal carotid arteries are patent from skullbase to carotid termini. ACAs and MCAs are unremarkable. A small anterior communicating artery is identified. No intracranial aneurysm is seen.  Review of the MIP images confirms the above findings.  CTA NECK FINDINGS  Three vessel aortic arch. Subclavian arteries are unremarkable. Evaluation of the proximal right common carotid artery is mildly limited by streak artifact from adjacent dense venous contrast. The remainder of the right common carotid artery and entirety of the left common carotid artery are unremarkable without evidence of stenosis. Mild atherosclerotic calcification is present involving the proximal cervical internal carotid arteries bilaterally without  evidence of stenosis. The proximal left internal carotid artery is mildly bulbous in appearance.  Both vertebral arteries are patent without evidence of stenosis. Left vertebral artery is slightly larger than the right. Visualized soft tissues of the neck are grossly unremarkable. Visualized lung apices are clear. Mild degenerative disc disease is noted at C5-6, and mild multilevel cervical facet arthrosis is noted.  Review of the MIP images confirms the above findings.  IMPRESSION: 1. 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 Unremarkable appearance of the anterior circulation. 2. Mild cervical carotid atherosclerosis without evidence of significant stenosis. 3. No evidence of acute intracranial abnormality.   Electronically Signed   By: Logan Bores   On: 04/22/2013 14:15    Scheduled Meds: . acetaminophen  650 mg Oral TID  . ampicillin  250 mg Oral 4 times per day  . atorvastatin  40 mg Oral QHS  . clopidogrel  75 mg Oral QAC breakfast  . docusate sodium  100 mg Oral BID  . donepezil  10 mg Oral QHS  . ferrous sulfate  325 mg Oral TID PC  . fesoterodine  8 mg Oral Daily  . fluticasone  2 spray Each Nare Daily  . levETIRAcetam  750 mg Oral Q12H  . loratadine  10 mg Oral Daily  . ondansetron (ZOFRAN) IV  4 mg Intravenous Once  . PHENobarbital  97.2 mg Oral QHS  . sodium chloride  3 mL Intravenous Q12H  . triamcinolone  1 application Topical BID   Continuous Infusions:   Principal Problem:   Femur fracture, left Active Problems:   CVA (cerebral infarction)   Seizures   Glioma of brain   Difficulty walking   Femur fracture   UTI (urinary tract infection)   Acute delirium    Time spent: 25 minutes    [facility] Pager [phone] If 7PM-7AM, please contact night-coverage at [email or web address], password [facility] 04/23/2013, 2:56 PM  LOS: 7 days

## 2013-04-23 NOTE — Progress Notes (Signed)
Orders received for swallow evaluation however per RN, patient has passed the RN stroke swallow screen and is tolerating diet. Defer formal evaluation per protocol. Please re-consult as needed.  Thank you  Sharmaine Bain MA, CCC-SLP [phone]   

## 2013-04-24 DIAGNOSIS — R29818 Other symptoms and signs involving the nervous system: Secondary | ICD-10-CM

## 2013-04-24 DIAGNOSIS — R4701 Aphasia: Secondary | ICD-10-CM

## 2013-04-24 MED ORDER — HYDROCODONE-ACETAMINOPHEN 5-325 MG PO TABS: 1.0000 | ORAL_TABLET | Freq: Four times a day (QID) | ORAL | Status: AC | PRN

## 2013-04-24 NOTE — Social Work (Signed)
SNF auth rec'd for d/c today to [facility]- auth # [id number]. Patient and wife pleased with location and plan. Will plan transfer via EMS this pm. Eduard Clos, MSW, [place]

## 2013-04-24 NOTE — Clinical Social Work Note (Signed)
CSW has faxed updated clinical information to [facility] for SNF authorization.  Pati Gallo, [place] Social Worker [phone]

## 2013-04-24 NOTE — Social Work (Signed)
I met with patient and his wife at bedside- they have been given SNF offers and prefer [facility]- I have messaged them to confirm bed availability and awaiting word from [facility]. Will advise-  Eduard Clos, MSW, [place]

## 2013-04-24 NOTE — Discharge Summary (Signed)
Physician Discharge Summary  Rodney Potts UVO:[id number] DOB: 11-Mar-1945 DOA: 04/16/2013  PCP: Pamelia Hoit, MD  Admit date: 04/16/2013 Discharge date: 04/24/2013  Time spent: 25 minutes  Recommendations for Outpatient Follow-up:  1. 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 Patient was admitted to the hospitalist service  Hospital Course:  Patient is a pleasant 68 year old gentleman with a past medical history of CVA on Plavix therapy, history of seizure disorder, cognitive impairment, dyslipidemia who is in his usual state health when on the evening of 04/16/2013 had a fall after losing his balance while changing his clothes. He was brought to the emergency room where x-ray showed a new subcapital left femoral neck fracture. Orthopedic surgery was consulted. Patient was taken to the operating room on 04/17/2013 where he underwent ORIF, procedure performed by Dr. Charlann Boxer of orthopedic surgery. Tolerated procedure well there are no immediate complications. Postoperatively however he did develop acute delirium likely secondary to anesthesia and narcotic analgesics. It's possible that underlying urinary tract infection may have  contributed to the development of delirium as urine cultures grew enterococcus species. Patient's however remained afebrile, hemodynamically stable. He was started on oral Levaquin 500 mg by mouth daily for a planned course of 7 days. His delirium eventually improved. Physical therapy was consulted. Plan was for patient to go to skilled nursing facility.  Originally, patient was scheduled to go to skilled nursing on 2/18. Around lunchtime, patient was noted to have spontaneous slurred speech and right upper extremity weakness. This lasted for roughly 10 minutes after onset. Hospitalist was notified and code stroke was initiated. Stat CT angiogram of the head and neck were ordered which were unremarkable. Case was discussed with neurology and patient was transferred over to [facility] stroke center. She was evaluated immediately by the neuro hospitalist. At this point again, patient's symptoms did not return. Patient underwent. Workup including MRI/MRA, echocardiogram, carotid Doppler. As was seen on CT angiogram, only finding on MRA wasSevere bilateral proximal PCA stenoses with mild to moderate distal PCA branch vessel narrowing and carotid siphon atherosclerosis. This was felt to be an incidental finding and not related to patient's symptoms. MRI was negative for acute CVA. Neurology felt this was a transient neurological event of worsening of patient's previous symptoms and not a TIA. Therefore, it was recommended patient no change his medications and continue Plavix daily. Patient otherwise cleared and patient will go to skilled nursing facility on 04/24/13.   Procedures:  Status post ORIF of subcapital left femoral neck fracture done 04/17/13  Consultations:  Neurology  Orthopedic surgery  Discharge Exam: Filed Vitals:   04/24/13 0956  BP: 104/65  Pulse: 76  Temp: 99 F (37.2 C)  Resp: 18    General: Alert and oriented x3, no acute distress, mild underlying cognitive chronic  deficit Cardiovascular: Regular rate and rhythm, S1-S2 Respiratory: Clear to auscultation bilaterally  Discharge Instructions  Discharge Orders   Future Appointments Provider Department Dept Phone   05/21/2013 3:00 PM Ronal Fear, NP [facility] [phone]   Future Orders Complete By Expires   Call MD for:  difficulty breathing, headache or visual disturbances  As directed    Call MD for:  extreme fatigue  As directed    Call MD for:  persistant dizziness or light-headedness  As directed    Call MD for:  persistant nausea and vomiting  As directed    Call MD for:  severe uncontrolled pain  As directed    Call MD for:  temperature >100.4  As directed    Change dressing  As directed    Comments:     Don't change dressing.  Maintain dressing for 14 day or until return to clinic. Waterproof dressing.   Diet - low sodium heart healthy  As directed    Increase activity slowly  As directed    Weight bearing as tolerated  As directed    Questions:     Laterality:  left   Extremity:  Lower       Medication List    STOP taking these medications       CALCIUM PO     magnesium (amino acid chelate) 133 MG tablet     VITAMIN E PO      TAKE these medications       acetaminophen 325 MG tablet  Commonly known as:  TYLENOL  Take 2 tablets (650 mg total) by mouth 3 (three) times daily.     atorvastatin 40 MG tablet  Commonly known as:  LIPITOR  Take 1 tablet (40 mg total) by mouth at bedtime.     bisacodyl 10 MG suppository  Commonly known as:  DULCOLAX  Place 1 suppository (10 mg total) rectally daily as needed for moderate constipation.     cetirizine 10 MG tablet  Commonly known as:  ZYRTEC  Take 10 mg by mouth daily.     cholecalciferol 1000 UNITS tablet  Commonly known as:  VITAMIN D  Take 1,000 Units by mouth daily.     clopidogrel 75 MG tablet  Commonly known as:  PLAVIX  Take 1 tablet (75 mg total) by mouth at bedtime.     donepezil 10 MG  tablet  Commonly known as:  ARICEPT  Take 1 tablet (10 mg total) by mouth at bedtime.     DSS 100 MG Caps  Take 100 mg by mouth 2 (two) times daily.     fesoterodine 8 MG Tb24 tablet  Commonly known as:  TOVIAZ  Take 1 tablet (8 mg total) by mouth daily.     FISH OIL PO  Take 1 capsule by mouth daily.     fluticasone 50 MCG/ACT nasal spray  Commonly known as:  FLONASE  Place 2 sprays into both nostrils daily.     HYDROcodone-acetaminophen 5-325 MG per tablet  Commonly known as:  NORCO  Take 1 tablet by mouth every 6 (six) hours as needed for moderate pain.     levETIRAcetam 750 MG tablet  Commonly known as:  KEPPRA  Take 1 tablet (750 mg total) by mouth every 12 (twelve) hours.     levofloxacin 500 MG tablet  Commonly known as:  LEVAQUIN  Take  1 tablet (500 mg total) by mouth daily x 3 more days until 2/23     naphazoline 0.1 % ophthalmic solution  Commonly known as:  NAPHCON  Place 1 drop into both eyes 4 (four) times daily as needed for irritation.     PHENobarbital 97.2 MG tablet  Commonly known as:  LUMINAL  Take 1 tablet (97.2 mg total) by mouth at bedtime.     VITAMIN C PO  Take 1 tablet by mouth daily.       Allergies  Allergen Reactions  . Amoxicillin Diarrhea  . Dilaudid [Hydromorphone Hcl] Other (See Comments)    DELUSIONS  . Lactose Intolerance (Gi) Diarrhea  . Sulfa Antibiotics Other (See Comments)    Cant control bladder       Follow-up Information   Follow up with Shelda Pal, MD. Schedule an appointment as soon as possible for a visit in 2 weeks.   Specialty:  Orthopedic Surgery   Contact information:   [address] [place] [address] [phone]        The results of significant diagnostics from this hospitalization (including imaging, microbiology, ancillary and laboratory) are listed below for reference.    Significant Diagnostic Studies: Ct Angio Head W/cm &/or Wo Cm  04/22/2013   CLINICAL DATA:  Altered  mental status, confusion. History of brain tumor. Slurred speech with right-sided weakness.  EXAM: CT ANGIOGRAPHY HEAD AND NECK  TECHNIQUE: Multidetector CT imaging of the head and neck was performed using the standard protocol during bolus administration of intravenous contrast. Multiplanar CT image reconstructions and MIPs were obtained to evaluate the vascular anatomy. Carotid stenosis measurements (when applicable) are obtained utilizing NASCET criteria, using the distal internal carotid diameter as the denominator.  CONTRAST:  OMNIPAQUE IOHEXOL 350 MG/ML SOLN  COMPARISON:  Head CT 04/22/2013 at 1308 hr. Brain MRI 02/18/2013. Head MRA 06/08/2012. Neck MRA 06/09/2011.  FINDINGS: CTA HEAD FINDINGS  Postcontrast head CT demonstrates stable, moderate cerebral atrophy. Inferior right frontal lobe encephalomalacia with coarse calcification is unchanged. Additional periventricular white matter hypodensities are unchanged and compatible with moderate chronic small vessel ischemic disease. There is no evidence of acute cortical infarct, midline shift, intracranial hemorrhage, or extra-axial fluid collection. No abnormal parenchymal or meningeal enhancement is identified. Prior bilateral cataract surgery is noted.  The distal vertebral arteries are patent. PICA origins are not well visualized. Right AICA origin is patent. SCA origins are patent. Basilar artery is patent without stenosis. There is severe focal stenosis of the left PCA near the P1-P2 junction, slightly worsened compared to prior MRA. There is severe stenosis of the proximal P2 segment on the right, likely unchanged. Mild distal PCA branch vessel irregularity is again seen. Posterior communicating arteries are not definitely identified.  There is mild bilateral carotid siphon atherosclerotic calcification. Internal carotid arteries are patent from skullbase to carotid termini. ACAs and MCAs are unremarkable. A small anterior communicating artery is  identified. No intracranial aneurysm is seen.  Review of the MIP images confirms the above findings.  CTA NECK FINDINGS  Three vessel aortic arch. Subclavian arteries are unremarkable. Evaluation of the proximal right common carotid artery is mildly limited by streak artifact from adjacent dense venous contrast. The remainder of the right common carotid artery and entirety of the left common carotid artery are unremarkable without evidence of stenosis. Mild atherosclerotic calcification is present involving the proximal cervical internal carotid arteries bilaterally without evidence of stenosis. The proximal left internal carotid artery is mildly bulbous  in appearance.  Both vertebral arteries are patent without evidence of stenosis. Left vertebral artery is slightly larger than the right. Visualized soft tissues of the neck are grossly unremarkable. Visualized lung apices are clear. Mild degenerative disc disease is noted at C5-6, and mild multilevel cervical facet arthrosis is noted.  Review of the MIP images confirms the above findings.  IMPRESSION: 1. 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 Unremarkable appearance of the anterior circulation. 2. Mild cervical carotid atherosclerosis without evidence of significant stenosis. 3. No evidence of acute intracranial abnormality.   Electronically Signed   By: Sebastian Ache   On: 04/22/2013 14:15   Dg Chest 1 View  04/16/2013   CLINICAL DATA:  Fall.  Hip pain.  EXAM: CHEST - 1 VIEW  COMPARISON:  01/15/2013  FINDINGS: Cardiac silhouette is normal in size. Aorta is mildly uncoiled. No mediastinal or hilar masses.  Clear lungs.  The bony thorax is intact.  IMPRESSION: No active disease.   Electronically Signed   By: Amie Portland M.D.   On: 04/16/2013 20:02   Dg Femur Left  04/16/2013   CLINICAL DATA:  Recent traumatic injury with pain  EXAM: LEFT FEMUR - 2 VIEW  COMPARISON:  None.  FINDINGS: There is a  subcapital femoral neck fracture identified with mild impaction at the fracture site. No other acute abnormality is noted. A prior left knee prosthesis is seen.  IMPRESSION: Subcapital left femoral neck fracture.   Electronically Signed   By: Alcide Clever M.D.   On: 04/16/2013 19:59   Ct Head Wo Contrast  04/22/2013   CLINICAL DATA:  Altered level of consciousness with confusion  EXAM: CT HEAD WITHOUT CONTRAST  TECHNIQUE: Contiguous axial images were obtained from the base of the skull through the vertex without intravenous contrast.  COMPARISON:  MR HEAD W/O CM dated 02/18/2013; CT HEAD W/O CM dated 02/18/2013  FINDINGS: There is moderate diffuse cerebral and cerebellar atrophy with compensatory ventriculomegaly. There is stable calcification along the inferior aspect of the right frontal lobe demonstrated on images 9 and 10. There is no shift of the midline. There is punctate basal ganglia calcification on the left which is stable. There is stable decreased density in the deep white matter of both cerebral hemispheres consistent with chronic small vessel ischemia. There is no evidence of an acute ischemic or hemorrhagic infarction. The cerebellum and brainstem exhibit no acute abnormalities.  At bone window settings the observed portions of the paranasal sinuses and mastoid air cells are clear. There is no evidence of an acute skull fracture.  IMPRESSION: 1. There is no evidence of acute ischemic or hemorrhagic infarction. There is no intracranial mass effect. 2. There is stable moderate diffuse cerebral and cerebellar atrophy with compensatory ventriculomegaly most compatible with chronic small vessel ischemia.   Electronically Signed   By: David  [place]   On: 04/22/2013 13:13   Ct Angio Neck W/cm &/or Wo/cm  04/22/2013   CLINICAL DATA:  Altered mental status, confusion. History of brain tumor. Slurred speech with right-sided weakness.  EXAM: CT ANGIOGRAPHY HEAD AND NECK  TECHNIQUE: Multidetector CT imaging  of the head and neck was performed using the standard protocol during bolus administration of intravenous contrast. Multiplanar CT image reconstructions and MIPs were obtained to evaluate the vascular anatomy. Carotid stenosis measurements (when applicable) are obtained utilizing NASCET criteria, using the distal internal carotid diameter as the denominator.  CONTRAST:  OMNIPAQUE IOHEXOL  350 MG/ML SOLN  COMPARISON:  Head CT 04/22/2013 at 1308 hr. Brain MRI 02/18/2013. Head MRA 06/08/2012. Neck MRA 06/09/2011.  FINDINGS: CTA HEAD FINDINGS  Postcontrast head CT demonstrates stable, moderate cerebral atrophy. Inferior right frontal lobe encephalomalacia with coarse calcification is unchanged. Additional periventricular white matter hypodensities are unchanged and compatible with moderate chronic small vessel ischemic disease. There is no evidence of acute cortical infarct, midline shift, intracranial hemorrhage, or extra-axial fluid collection. No abnormal parenchymal or meningeal enhancement is identified. Prior bilateral cataract surgery is noted.  The distal vertebral arteries are patent. PICA origins are not well visualized. Right AICA origin is patent. SCA origins are patent. Basilar artery is patent without stenosis. There is severe focal stenosis of the left PCA near the P1-P2 junction, slightly worsened compared to prior MRA. There is severe stenosis of the proximal P2 segment on the right, likely unchanged. Mild distal PCA branch vessel irregularity is again seen. Posterior communicating arteries are not definitely identified.  There is mild bilateral carotid siphon atherosclerotic calcification. Internal carotid arteries are patent from skullbase to carotid termini. ACAs and MCAs are unremarkable. A small anterior communicating artery is identified. No intracranial aneurysm is seen.  Review of the MIP images confirms the above findings.  CTA NECK FINDINGS  Three vessel aortic arch. Subclavian arteries  are unremarkable. Evaluation of the proximal right common carotid artery is mildly limited by streak artifact from adjacent dense venous contrast. The remainder of the right common carotid artery and entirety of the left common carotid artery are unremarkable without evidence of stenosis. Mild atherosclerotic calcification is present involving the proximal cervical internal carotid arteries bilaterally without evidence of stenosis. The proximal left internal carotid artery is mildly bulbous in appearance.  Both vertebral arteries are patent without evidence of stenosis. Left vertebral artery is slightly larger than the right. Visualized soft tissues of the neck are grossly unremarkable. Visualized lung apices are clear. Mild degenerative disc disease is noted at C5-6, and mild multilevel cervical facet arthrosis is noted.  Review of the MIP images confirms the above findings.  IMPRESSION: 1. 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 Unremarkable appearance of the anterior circulation. 2. Mild cervical carotid atherosclerosis without evidence of significant stenosis. 3. No evidence of acute intracranial abnormality.   Electronically Signed   By: Sebastian Ache   On: 04/22/2013 14:15   Mr Maxine Glenn Head Wo Contrast  04/23/2013   CLINICAL DATA:  Sudden onset of slurred speech and left upper extremity weakness with subsequent resolution. Evaluate for stroke. Remote history of brain tumor.  EXAM: MRI HEAD WITHOUT CONTRAST  MRA HEAD WITHOUT CONTRAST  TECHNIQUE: Multiplanar, multiecho pulse sequences of the brain and surrounding structures were obtained without intravenous contrast. Angiographic images of the head were obtained using MRA technique without contrast.  COMPARISON:  CT head and CTA head 04/22/2013.  Brain MRI 02/18/2013.  FINDINGS: MRI HEAD FINDINGS  There is no evidence of acute infarct. Diffuse microhemorrhages are again seen throughout both cerebral  hemispheres, predominantly in the deep white matter and with milder involvement of the cerebellum. Moderate cerebral atrophy is unchanged. Confluent T2 hyperintensities within the cerebral white matter do not appear significantly changed. Remote lacunar infarct is noted in the right thalamus and left basal ganglia. There is no evidence of mass, midline shift, or extra-axial fluid collection.  Major intracranial vascular flow voids are preserved. Prior bilateral cataract surgery is noted. Paranasal sinuses and mastoid air cells  are clear. Decreased T1 bone marrow signal inferiorly in C2 and within the C3 vertebral body is unchanged.  MRA HEAD FINDINGS  Visualized distal vertebral arteries are patent. PICA origins are patent bilaterally. Right AICA is patent. Left AICA is not visualized. Basilar artery is patent without stenosis. SCAs are patent. As described on yesterday's CTA, there are severe bilateral proximal PCA stenoses. There is also moderate focal distal right PCA branch vessel stenosis with mild distal left PCA branch vessel narrowing.  Internal carotid arteries are patent from skullbase to carotid termini. There is mild right and mild-to-moderate left carotid siphon irregular narrowing. ACAS and MCAs are unremarkable. No intracranial aneurysm is identified. There is a patent anterior communicating artery. Posterior communicating arteries are not identified.  IMPRESSION: 1. No acute infarct. 2. Unchanged appearance of advanced chronic small vessel ischemic disease. 3. Severe bilateral proximal PCA stenoses with mild to moderate distal PCA branch vessel narrowing and carotid siphon atherosclerosis.   Electronically Signed   By: Sebastian Ache   On: 04/23/2013 19:03   Mr Brain Wo Contrast  04/23/2013   CLINICAL DATA:  Sudden onset of slurred speech and left upper extremity weakness with subsequent resolution. Evaluate for stroke. Remote history of brain tumor.  EXAM: MRI HEAD WITHOUT CONTRAST  MRA HEAD  WITHOUT CONTRAST  TECHNIQUE: Multiplanar, multiecho pulse sequences of the brain and surrounding structures were obtained without intravenous contrast. Angiographic images of the head were obtained using MRA technique without contrast.  COMPARISON:  CT head and CTA head 04/22/2013.  Brain MRI 02/18/2013.  FINDINGS: MRI HEAD FINDINGS  There is no evidence of acute infarct. Diffuse microhemorrhages are again seen throughout both cerebral hemispheres, predominantly in the deep white matter and with milder involvement of the cerebellum. Moderate cerebral atrophy is unchanged. Confluent T2 hyperintensities within the cerebral white matter do not appear significantly changed. Remote lacunar infarct is noted in the right thalamus and left basal ganglia. There is no evidence of mass, midline shift, or extra-axial fluid collection.  Major intracranial vascular flow voids are preserved. Prior bilateral cataract surgery is noted. Paranasal sinuses and mastoid air cells are clear. Decreased T1 bone marrow signal inferiorly in C2 and within the C3 vertebral body is unchanged.  MRA HEAD FINDINGS  Visualized distal vertebral arteries are patent. PICA origins are patent bilaterally. Right AICA is patent. Left AICA is not visualized. Basilar artery is patent without stenosis. SCAs are patent. As described on yesterday's CTA, there are severe bilateral proximal PCA stenoses. There is also moderate focal distal right PCA branch vessel stenosis with mild distal left PCA branch vessel narrowing.  Internal carotid arteries are patent from skullbase to carotid termini. There is mild right and mild-to-moderate left carotid siphon irregular narrowing. ACAS and MCAs are unremarkable. No intracranial aneurysm is identified. There is a patent anterior communicating artery. Posterior communicating arteries are not identified.  IMPRESSION: 1. No acute infarct. 2. Unchanged appearance of advanced chronic small vessel ischemic disease. 3. Severe  bilateral proximal PCA stenoses with mild to moderate distal PCA branch vessel narrowing and carotid siphon atherosclerosis.   Electronically Signed   By: Sebastian Ache   On: 04/23/2013 19:03   Dg Pelvis Portable  04/17/2013   CLINICAL DATA:  Postop left hip arthroplasty  EXAM: PORTABLE PELVIS 1-2 VIEWS  COMPARISON:  DG PELVIS 1-2 VIEWS dated 01/14/2013; DG FEMUR*L* dated 04/16/2013  FINDINGS: Post left bipolar hip arthroplasty. No fracture or dislocation. Alignment appears near anatomic. There is a very minimal amount of  scattered subcutaneous emphysema about the operative site. Multiple surgical clips overlie the lower pelvis. No radiopaque foreign body.  IMPRESSION: Post left bipolar hip arthroplasty without evidence of complication.   Electronically Signed   By: Simonne Come M.D.   On: 04/17/2013 19:46    Microbiology: Recent Results (from the past 240 hour(s))  URINE CULTURE     Status: None   Collection Time    04/17/13 12:05 AM      Result Value Ref Range Status   Specimen Description URINE, CLEAN CATCH   Final   Special Requests NONE   Final   Culture  Setup Time     Final   Value: 04/17/2013 09:22     Performed at [facility] Count     Final   Value: >=100,000 COLONIES/ML     Performed at [facility]   Culture     Final   Value: ENTEROCOCCUS SPECIES     Performed at [facility]   Report Status 04/19/2013 FINAL   Final   Organism ID, Bacteria ENTEROCOCCUS SPECIES   Final  MRSA PCR SCREENING     Status: None   Collection Time    04/23/13  5:08 AM      Result Value Ref Range Status   MRSA by PCR NEGATIVE  NEGATIVE Final   Comment:            The GeneXpert MRSA Assay (FDA     approved for NASAL specimens     only), is one component of a     comprehensive MRSA colonization     surveillance program. It is not     intended to diagnose MRSA     infection nor to guide or     monitor treatment for     MRSA infections.     Labs: Basic  Metabolic Panel:  Recent Labs Lab 04/18/13 0805 04/19/13 0555 04/20/13 0445 04/21/13 0355 04/22/13 1323  NA 135* 137 140 138 135*  K 4.3 3.7 3.4* 4.0 3.9  CL 98 100 104 103 96  CO2 26 26 28 26 28   GLUCOSE 91 113* 97 104* 120*  BUN 16 12 17 16 17   CREATININE 0.94 0.89 0.88 0.88 0.85  CALCIUM 8.4 8.3* 7.7* 7.9* 8.8   Liver Function Tests:  Recent Labs Lab 04/22/13 1323  AST 30  ALT 24  ALKPHOS 59  BILITOT 0.6  PROT 6.0  ALBUMIN 2.5*   No results found for this basename: LIPASE, AMYLASE,  in the last 168 hours No results found for this basename: AMMONIA,  in the last 168 hours CBC:  Recent Labs Lab 04/18/13 0805 04/19/13 0555 04/20/13 0445 04/21/13 0355 04/22/13 1323  WBC 8.8 9.5 8.7 10.7* 9.8  HGB 10.2* 9.4* 8.0* 8.5* 9.0*  HCT 32.4* 28.7* 24.2* 26.7* 27.9*  MCV 89.0 87.2 87.7 87.5 86.6  PLT 187 [id number] [phone]   Cardiac Enzymes:  Recent Labs Lab 04/22/13 1323  TROPONINI <0.30   BNP: BNP (last 3 results) No results found for this basename: PROBNP,  in the last 8760 hours CBG:  Recent Labs Lab 04/22/13 1253  GLUCAP 123*       Signed:  Sharanda Shinault K  [facility] 04/24/2013, 12:35 PM

## 2013-04-24 NOTE — Progress Notes (Signed)
Report called and given to [place], At [facility] on [address] for transportation.

## 2013-04-24 NOTE — Progress Notes (Signed)
Stroke Team Progress Note  HISTORY Rodney Potts is an 68 y.o. male history of brain tumor resection, seizure disorder,stroke and TIAs as well as seizure disorder, who was admitted on 04/16/2013 followingacute fracture of left femur. Patient underwent surgical repair and postop course was unremarkable until today 04/22/2013 at about 1245 when he developed sudden onset of slurred speech and left upper extremity weakness. Deficits cleared after several minutes. CT scan of his head showed no acute intracranial abnormality. Patient subsequently developed expressive aphasia at about 3:50 PM. His deficit also subsequently resolved. CTA of the head and neck was performed which showed severe stenosis of right and left PCA's, but was otherwise unremarkable. He's been taking Plavix daily.he was transferred from [facility] to [facility] stepdown unit for further management. NIH stroke score at this point is 1 facial asymmetry indicative of possible mild left lower facial weakness. Patient was not administerd TPA secondary to rapid resolution of deficits.   SUBJECTIVE Recovered fully and MRI negative for acute stroke and he wants to go home  OBJECTIVE Most recent Vital Signs: Filed Vitals:   04/24/13 0200 04/24/13 0423 04/24/13 0610 04/24/13 0956  BP: 118/69 113/74 100/65 104/65  Pulse: 76 66 64 76  Temp: 98.7 F (37.1 C) 98.3 F (36.8 C) 97.9 F (36.6 C) 99 F (37.2 C)  TempSrc: Oral Oral Oral Oral  Resp: 18 18 18 18   Height:      Weight:      SpO2: 100% 96% 96% 92%   CBG (last 3)   Recent Labs  04/22/13 1253  GLUCAP 123*    IV Fluid Intake:     MEDICATIONS  . acetaminophen  650 mg Oral TID  . ampicillin  250 mg Oral 4 times per day  . atorvastatin  40 mg Oral QHS  . clopidogrel  75 mg Oral QAC breakfast  . docusate sodium  100 mg Oral BID  . donepezil  10 mg Oral QHS  . ferrous sulfate  325 mg Oral TID PC  . fesoterodine  8 mg Oral Daily  . fluticasone  2 spray Each Nare Daily   . levETIRAcetam  750 mg Oral Q12H  . loratadine  10 mg Oral Daily  . ondansetron (ZOFRAN) IV  4 mg Intravenous Once  . PHENobarbital  97.2 mg Oral QHS  . sodium chloride  3 mL Intravenous Q12H  . triamcinolone  1 application Topical BID   PRN:  bisacodyl, chlorpheniramine-HYDROcodone, menthol-cetylpyridinium, methocarbamol, metoCLOPramide (REGLAN) injection, metoCLOPramide, naphazoline, ondansetron (ZOFRAN) IV, phenol, polyethylene glycol, sodium chloride  Diet:  General thin liquids Activity:   DVT Prophylaxis:  SCDs   CLINICALLY SIGNIFICANT STUDIES Basic Metabolic Panel:   Recent Labs Lab 04/21/13 0355 04/22/13 1323  NA 138 135*  K 4.0 3.9  CL 103 96  CO2 26 28  GLUCOSE 104* 120*  BUN 16 17  CREATININE 0.88 0.85  CALCIUM 7.9* 8.8   Liver Function Tests:   Recent Labs Lab 04/22/13 1323  AST 30  ALT 24  ALKPHOS 59  BILITOT 0.6  PROT 6.0  ALBUMIN 2.5*   CBC:   Recent Labs Lab 04/21/13 0355 04/22/13 1323  WBC 10.7* 9.8  HGB 8.5* 9.0*  HCT 26.7* 27.9*  MCV 87.5 86.6  PLT 207 252   Coagulation:   Recent Labs Lab 04/22/13 1330  LABPROT 13.5  INR 1.05   Cardiac Enzymes:   Recent Labs Lab 04/22/13 1323  TROPONINI <0.30   Urinalysis: No results found for this basename:  COLORURINE, APPERANCEUR, LABSPEC, [place], GLUCOSEU, HGBUR, BILIRUBINUR, KETONESUR, PROTEINUR, UROBILINOGEN, NITRITE, LEUKOCYTESUR,  in the last 168 hours Lipid Panel    Component Value Date/Time   CHOL 136 04/23/2013 0936   TRIG 65 04/23/2013 0936   HDL 39* 04/23/2013 0936   CHOLHDL 3.5 04/23/2013 0936   VLDL 13 04/23/2013 0936   LDLCALC 84 04/23/2013 0936   HgbA1C  Lab Results  Component Value Date   HGBA1C 5.3 04/23/2013    Urine Drug Screen:     Component Value Date/Time   LABOPIA NONE DETECTED 06/09/2011 2146   COCAINSCRNUR NONE DETECTED 06/09/2011 2146   LABBENZ NONE DETECTED 06/09/2011 2146   AMPHETMU NONE DETECTED 06/09/2011 2146   THCU NONE DETECTED 06/09/2011 2146   LABBARB  POSITIVE* 06/09/2011 2146    Alcohol Level: No results found for this basename: ETH,  in the last 168 hours   CT of the brain  04/22/2013    1. There is no evidence of acute ischemic or hemorrhagic infarction. There is no intracranial mass effect. 2. There is stable moderate diffuse cerebral and cerebellar atrophy with compensatory ventriculomegaly most compatible with chronic small vessel ischemia.     CT angio head and neck 04/22/2013  1. 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 Unremarkable appearance of the anterior circulation. 2. Mild cervical carotid atherosclerosis without evidence of significant stenosis. 3. No evidence of acute intracranial abnormality.     MRI / MRA of the brain   04/23/2013 1. No acute infarct.  2. Unchanged appearance of advanced chronic small vessel ischemic  disease.  3. Severe bilateral proximal PCA stenoses with mild to moderate  distal PCA branch vessel narrowing and carotid siphon  atherosclerosis.  2D Echocardiogram  ejection fraction 60%. No cardiac source of embolism was identified.  Carotid Doppler  Preliminary findings: Bilateral: 1-39% ICA stenosis. Vertebral artery flow is antegrade   CXR  04/16/2013 No active disease.  EKG sinus rhythm with PACs  Therapy Recommendations CIR (pre stroke recommendation)  Physical Exam   Pleasant middle aged Caucasian male not in distress.Awake alert. Afebrile. Head is nontraumatic. Neck is supple without bruit. Hearing is normal. Cardiac exam no murmur or gallop. Lungs are clear to auscultation. Distal pulses are well felt. Neurological Exam : Awake alert oriented x3. Minimal dysarthria. No aphasia. Extraocular moments are full range without nystagmus. Visual acuity seems adequate. Fundi were not visualized. Mild right lower facial weakness. Tongue midline. Motor system exam mild right approximate a drift. Weakness of right grip and intrinsic hand muscles.  4/5 strength the right approximately and 4+/5 strength in the right lower extremity. Normal strength on the left. Coordination is slow but accurate on the right. Sensation intact. Gait was not tested. ASSESSMENT Rodney Potts is a 68 y.o. male presenting during hospitalization with slurred speech and left upper extremity weakness post left femur fracture repair, which resolved, then developed expressive aphasia, which aslo resolved. Imaging confirms a left greater than right PCA stenosis; have not been able to confirm stroke at this time. Unsure if MRI can be completed, will discuss with surgeon.  On clopidogrel 75 mg orally every day prior to admission. Now on clopidogrel 75 mg orally every day for secondary stroke prevention. Work up underway.   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 Will consider for Socrates Trial (use of Brilinta in acute stroke)  Stroke work up complete.  Possible TIA. Probable discharge to a skilled nursing facility today.  Mikey Bussing PA-C [facility] Pager [phone] 04/24/2013, 10:28 AM  I have personally obtained a history, examined the patient, evaluated imaging results, and formulated the assessment and plan of care. I agree with the above.  Antony Contras, MD

## 2013-04-24 NOTE — Evaluation (Signed)
Occupational Therapy RE-Evaluation Patient Details Name: Rodney Potts MRN: [id number] DOB: [birth year] Today's Date: 04/24/2013 Time: [id number] OT Time Calculation (min): 25 min  OT Assessment / Plan / Recommendation History of present illness Pt is a 68 y o male with past medical hx of crani for tumor, prostate ca and CVA.  Pt fell prior to this admission resulting in a L hip Fx.  Pt now with total hip replacment and posterior hip precautions.  Pt had TIA after hip surgery and was transferred here from [facility].  MRI is negative.   Clinical Impression   [facility] and now at [facility] with the deficits listed below.  Pt with TIA after hip surgery and continues to have significant balance and mobility deficits.  Pt would benefit from SNF rehab before returning home with his wife to increase his I with his basic self care back to baseline. Goals from previous admit to [facility] are appropriate.    OT Assessment  Patient needs continued OT Services    Follow Up Recommendations  SNF;Supervision/Assistance - 24 hour    Barriers to Discharge Decreased caregiver support wife only one available to assist.  Equipment Recommendations  None recommended by OT    Recommendations for Other Services    Frequency  Min 2X/week    Precautions / Restrictions Precautions Precautions: Posterior Hip;Fall Precaution Booklet Issued: Yes (comment) Precaution Comments: Reviewed posterior precautions and WBAT status.  Restrictions Weight Bearing Restrictions: Yes LLE Weight Bearing: Weight bearing as tolerated   Pertinent Vitals/Pain Pt did not c/o pain.    ADL  Eating/Feeding: Set up;Minimal assistance Where Assessed - Eating/Feeding: Chair Grooming: Simulated;Set up Where Assessed - Grooming: Supported sitting Upper Body Bathing: Simulated;Minimal assistance Where Assessed - Upper Body Bathing: Supported sitting Lower Body Bathing:  Simulated;Maximal assistance Where Assessed - Lower Body Bathing: Supported sit to stand Upper Body Dressing: Simulated;Minimal assistance Where Assessed - Upper Body Dressing: Supported sitting Lower Body Dressing: +1 Total assistance Where Assessed - Lower Body Dressing: Supported sit to Pharmacist, hospitalstand Toilet Transfer: Teacher, early years/preerformed;+2 Total assistance Toilet Transfer: Patient Percentage: 20% StatisticianToilet Transfer Method: Sit to stand;Stand pivot AcupuncturistToilet Transfer Equipment: Comfort height toilet;Grab bars Toileting - ArchitectClothing Manipulation and Hygiene: +2 Total assistance Toileting - ArchitectClothing Manipulation and Hygiene: Patient Percentage: 20% Where Assessed - Toileting Clothing Manipulation and Hygiene: Sit on 3-in-1 or toilet;Standing;Sit to stand from 3-in-1 or toilet Equipment Used: Rolling walker;Gait belt Transfers/Ambulation Related to ADLs: Pt unable to achieve upright posture wanting to push posteriorly.  Seems pt is unaware of his inabliltiy to find midline and when cued to change almost feels as if he is falling foreward.. ADL Comments: Pts adls are difficult most in standing due to balance deficits.  Cognitive status also affects his ability to complete adls.    OT Diagnosis: Generalized weakness;Cognitive deficits;Acute pain;Apraxia  OT Problem List: Decreased strength;Decreased range of motion;Decreased activity tolerance;Impaired balance (sitting and/or standing);Decreased cognition;Decreased safety awareness;Decreased knowledge of use of DME or AE;Decreased knowledge of precautions;Impaired sensation;Impaired tone;Impaired UE functional use;Pain OT Treatment Interventions: Self-care/ADL training;Therapeutic exercise;Energy conservation;DME and/or AE instruction;Therapeutic activities;Cognitive remediation/compensation;Patient/family education;Balance training   OT Goals(Current goals can be found in the care plan section) Acute Rehab OT Goals Patient Stated Goal: to go home OT Goal Formulation:  With patient/family Time For Goal Achievement: 05/08/13 Potential to Achieve Goals: Good  Visit Information  Last OT Received On: 04/24/13 Assistance Needed: +2 PT/OT/SLP Co-Evaluation/Treatment: Yes Reason for Co-Treatment: For patient/therapist safety;Complexity of  the patient's impairments (multi-system involvement) OT goals addressed during session: ADL's and self-care History of Present Illness: Pt is a 43 y o male with past medical hx of crani for tumor, prostate ca and CVA.  Pt fell prior to this admission resulting in a L hip Fx.  Pt now with total hip replacment and posterior hip precautions.  Pt had TIA after hip surgery and was transferred here from [facility].  MRI is negative.       Prior [place] expects to be discharged to:: Skilled nursing facility Living Arrangements: Spouse/significant other Available Help at Discharge: Other (Comment);Family Type of Home: House Home Access: Stairs to enter [employer] of Steps: 3-4 Entrance Stairs-Rails: Left Home Layout: One level Home Equipment: Cane - single point;Walker - 2 wheels;Bedside commode;Shower seat Additional Comments: pt goes to adult daycare center per wife  Lives With: Spouse Prior Function Level of Independence: Independent with assistive device(s) Dominant Hand: Right         Vision/Perception Vision - History Baseline Vision: Wears glasses all the time Perception Perception: Not tested Praxis Praxis: Impaired Praxis Impairment Details: Motor planning;Initiation   Cognition  Cognition Arousal/Alertness: Awake/alert Behavior During Therapy: Flat affect Overall Cognitive Status: Impaired/Different from baseline Area of Impairment: Attention;Following commands;Memory;Safety/judgement;Awareness;Problem solving Orientation Level: Disoriented to;Time Current Attention Level: Sustained Memory: Decreased short-term memory;Decreased recall of  precautions Following Commands: Follows one step commands inconsistently Safety/Judgement: Decreased awareness of safety;Decreased awareness of deficits Awareness: Intellectual Problem Solving: Slow processing;Difficulty sequencing;Requires verbal cues;Requires tactile cues General Comments: wife states pt usually has a hard time "coming out of anesthesia"    Extremity/Trunk Assessment Upper Extremity Assessment Upper Extremity Assessment: Overall WFL for tasks assessed Lower Extremity Assessment Lower Extremity Assessment: Defer to PT evaluation Cervical / Trunk Assessment Cervical / Trunk Assessment: Kyphotic Cervical / Trunk Exceptions: unable to find midline when standing.  Pt with posterior lean and is not aware of it.     Mobility Bed Mobility Overal bed mobility: Needs Assistance Bed Mobility: Supine to Sit Supine to sit: Mod assist General bed mobility comments: multi-modal cues for sequencing and facilitation of safe techniques. Increased time. Assist for L LE and scooting to EOB Transfers Overall transfer level: Needs assistance Equipment used: Rolling walker (2 wheeled) Transfers: Stand Pivot Transfers;Sit to/from Stand Sit to Stand: +2 physical assistance Stand pivot transfers: +2 physical assistance General transfer comment: Assist to rise, stabilize, control descent. Performed 5x for transfers, strengthening, technique. Pt unable to stand fully erect despite multimodal cues therefore requiring complete assist to remain standing, spouse held RW and therapy blocked pt's feet from sliding yet pt still unable to perform full trunk and hip extension therefore did not pivot but moved bed to place [facility] and then recliner (nsg tech assisted with hygiene)     Exercise     Balance Balance Overall balance assessment: Needs assistance Sitting-balance support: Feet unsupported;Bilateral upper extremity supported Sitting balance-Leahy Scale: Poor Sitting balance - Comments: post and  R bias Postural control: Right lateral lean Standing balance support: Bilateral upper extremity supported;During functional activity Standing balance-Leahy Scale: Poor Standing balance comment: Pt uanble to stand without +2 assist. General Comments General comments (skin integrity, edema, etc.): Overall feel pt would benefit from SNF rehab due to mobility deficits.   End of Session OT - End of Session Equipment Utilized During Treatment: Rolling walker;Gait belt Activity Tolerance: Patient tolerated treatment well Patient left: in chair;with call bell/phone within reach;with family/visitor present Nurse  Communication: Mobility status;Weight bearing status;Precautions  GO     Glenford Peers 04/24/2013, 12:01 PM [phone]

## 2013-04-24 NOTE — Progress Notes (Signed)
Physical Therapy Treatment Patient Details Name: Rodney Potts MRN: [id number] DOB: September 21, 1945 Today's Date: 04/24/2013 Time: 0623-7628 PT Time Calculation (min): 26 min  PT Assessment / Plan / Recommendation  History of Present Illness Pt is a 68 y o male with past medical hx of crani for tumor, prostate ca and CVA.  Pt fell prior to this admission resulting in a L hip Fx.  Pt now with total hip replacment and posterior hip precautions.  Pt had TIA after hip surgery and was transferred here from [facility].  MRI is negative.   PT Comments   Patient demonstrates deficits in functional mobility as indicated below. Will benefit from continued skilled PT to address deficits and maximize function. Will see as indicated and progress as tolerated. Recommend SNF.  Follow Up Recommendations  SNF           Equipment Recommendations  None recommended by PT       Frequency Min 3X/week   Progress towards PT Goals Progress towards PT goals: Progressing toward goals  Plan Current plan remains appropriate    Precautions / Restrictions Precautions Precautions: Posterior Hip;Fall Precaution Booklet Issued: Yes (comment) Precaution Comments: Reviewed posterior precautions and WBAT status.  Restrictions Weight Bearing Restrictions: Yes LLE Weight Bearing: Weight bearing as tolerated   Pertinent Vitals/Pain Patient reports no pain    Mobility  Bed Mobility Overal bed mobility: Needs Assistance Bed Mobility: Supine to Sit Supine to sit: Mod assist General bed mobility comments: multi-modal cues for sequencing and facilitation of safe techniques. Increased time. Assist for L LE and scooting to EOB Transfers Overall transfer level: Needs assistance Equipment used: Rolling walker (2 wheeled) Transfers: Sit to/from [facility] Sit to Stand: +2 physical assistance Stand pivot transfers: +2 physical assistance General transfer comment: Assist to rise, stabilize,  control descent. Performed 5x for transfers, strengthening, technique. Pt unable to stand fully erect despite multimodal cues therefore requiring complete assist to remain standing, spouse held RW and therapy blocked pt's feet from sliding yet pt still unable to perform full trunk and hip extension therefore did not pivot but moved bed to place [facility] and then recliner (nsg tech assisted with hygiene)      PT Goals (current goals can now be found in the care plan section) Acute Rehab PT Goals Patient Stated Goal: to go home PT Goal Formulation: Patient unable to participate in goal setting Time For Goal Achievement: 05/02/13 Potential to Achieve Goals: Fair  Visit Information  Last PT Received On: 04/24/13 Assistance Needed: +2 PT/OT/SLP Co-Evaluation/Treatment: Yes Reason for Co-Treatment: Complexity of the patient's impairments (multi-system involvement);Necessary to address cognition/behavior during functional activity;For patient/therapist safety PT goals addressed during session: Mobility/safety with mobility OT goals addressed during session: ADL's and self-care History of Present Illness: Pt is a 68 y o male with past medical hx of crani for tumor, prostate ca and CVA.  Pt fell prior to this admission resulting in a L hip Fx.  Pt now with total hip replacment and posterior hip precautions.  Pt had TIA after hip surgery and was transferred here from [facility].  MRI is negative.    Subjective Data  Subjective: It does not hurt at all Patient Stated Goal: to go home   Cognition  Cognition Arousal/Alertness: Awake/alert Behavior During Therapy: Flat affect Overall Cognitive Status: Impaired/Different from baseline Area of Impairment: Attention;Following commands;Memory;Safety/judgement;Awareness;Problem solving Orientation Level: Disoriented to;Time Current Attention Level: Sustained Memory: Decreased short-term memory;Decreased recall of precautions Following Commands:  Follows one step commands inconsistently Safety/Judgement: Decreased awareness of safety;Decreased awareness of deficits Awareness: Intellectual Problem Solving: Slow processing;Difficulty sequencing;Requires verbal cues;Requires tactile cues General Comments: wife states pt usually has a hard time "coming out of anesthesia"    Balance  Balance Overall balance assessment: Needs assistance Sitting-balance support: Feet unsupported;Bilateral upper extremity supported Sitting balance-Leahy Scale: Poor Sitting balance - Comments: post and R bias Postural control: Right lateral lean Standing balance support: Bilateral upper extremity supported;During functional activity Standing balance-Leahy Scale: Poor Standing balance comment: Pt uanble to stand without +2 assist. General Comments General comments (skin integrity, edema, etc.): Overall feel pt would benefit from SNF rehab due to mobility deficits.  End of Session PT - End of Session Equipment Utilized During Treatment: Gait belt Activity Tolerance: Patient limited by fatigue Patient left: in chair;with call bell/phone within reach;with family/visitor present Nurse Communication:  (maximove on white board in room, nsg tech aware lift pad under pt)   GP     Duncan Dull 04/24/2013, 12:51 PM Alben Deeds, [place] DPT  [phone]

## 2013-04-24 NOTE — Progress Notes (Signed)
Discharge orders received, pt for discharge to [facility].  IV D/C with dressing CDI to L hip,  D/C instructions and Rx given with verbalized understanding.  PTAR brought pt downstairs via stretcher.

## 2013-04-24 NOTE — Progress Notes (Signed)
*  PRELIMINARY RESULTS* Vascular Ultrasound Carotid Duplex (Doppler) has been completed.  Preliminary findings: Bilateral:  1-39% ICA stenosis.  Vertebral artery flow is antegrade.      Landry Mellow, RDMS, RVT  04/24/2013, 9:55 AM

## 2013-04-28 ENCOUNTER — Ambulatory Visit: Payer: Medicare Other | Admitting: Physical Therapy

## 2013-04-28 ENCOUNTER — Encounter: Payer: Medicare Other | Admitting: Occupational Therapy

## 2013-04-30 ENCOUNTER — Ambulatory Visit: Payer: Medicare Other | Admitting: Physical Therapy

## 2013-04-30 ENCOUNTER — Encounter: Payer: Medicare Other | Admitting: Occupational Therapy

## 2013-05-21 ENCOUNTER — Encounter: Payer: Self-pay | Admitting: Nurse Practitioner

## 2013-05-21 ENCOUNTER — Ambulatory Visit (INDEPENDENT_AMBULATORY_CARE_PROVIDER_SITE_OTHER): Payer: Medicare Other | Admitting: Nurse Practitioner

## 2013-05-21 VITALS — BP 98/70 | HR 76

## 2013-05-21 DIAGNOSIS — Z87898 Personal history of other specified conditions: Secondary | ICD-10-CM

## 2013-05-21 DIAGNOSIS — R569 Unspecified convulsions: Secondary | ICD-10-CM

## 2013-05-21 DIAGNOSIS — Z85841 Personal history of malignant neoplasm of brain: Secondary | ICD-10-CM

## 2013-05-21 DIAGNOSIS — R269 Unspecified abnormalities of gait and mobility: Secondary | ICD-10-CM

## 2013-05-21 NOTE — Patient Instructions (Signed)
Continue Aricept to 10 mg daily if tolerated.   Continue Keppra 750 twice daily and phenobarbital 97.2 mg at night for seizures.  Continue Plavix for secondary stroke prevention.  He was counseled about fall prevention and gait instability.  Follow up in 6 months or call earlier if necessary.

## 2013-05-21 NOTE — Progress Notes (Signed)
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 History of TIA 09/21/11   05/21/13 (LL):  04/16/2013 had a fall after losing his balance while changing his clothes. He was brought to the emergency room where x-ray showed a new subcapital left femoral neck fracture. Orthopedic surgery was consulted. Patient was taken to the operating room on 04/17/2013 where he underwent ORIF, procedure performed by Dr. Alvan Dame of orthopedic surgery. Tolerated procedure well there are no immediate complications. Postoperatively however he did develop acute delirium likely secondary to anesthesia and narcotic analgesics. Originally, patient was scheduled to go to skilled nursing on 2/18. Around lunchtime, patient was noted to have spontaneous slurred speech and right upper extremity weakness. This lasted for roughly 10 minutes after onset. Hospitalist was notified and code stroke was initiated. Stat CT angiogram of the head and neck were ordered which were unremarkable. Case was discussed with neurology and patient was transferred over to [facility] stroke center. She was evaluated immediately by the neuro hospitalist. At this point again, patient's symptoms did not return. Patient underwent. Workup including MRI/MRA, echocardiogram, carotid Doppler. As was seen on CT angiogram, only finding on MRA wasSevere bilateral proximal PCA stenoses with mild to moderate distal PCA branch vessel narrowing and carotid siphon atherosclerosis.  This was felt to be an incidental finding and not related to patient's symptoms. MRI was negative for acute CVA. Neurology felt this was a transient neurological event of worsening of  patient's previous symptoms and not a TIA. Therefore, it was recommended patient no change his medications and continue Plavix daily. Patient otherwise cleared and patient will go to skilled nursing facility on 04/24/13. He was discharged from skilled nursing on 05/18/13 and is back at home.  He will continue outpatient PT starting Monday.  No recurrent seizures.  02/17/2013 (PS): He returns for followup of her last visit on 01/12/13 and with [place] practitioner. He continues to have frequent falls and fell twice last week from his chair and hurt his back. He had a major fall in November when he fell down the steps. Fortunately did not have any fractures. He was admitted to the hospital for several days and was found to have rhabdomyolysis and he required rehabilitation for a week. The patient's wife feels that he never had a seizure in the hospital as he had missed 3 doses of Keppra. His Keppra dose was changed to 750 twice daily and phenobarbital was continued at 92.7 mg at night. The patient plans to attend the adult day care program and the wife has brought a form in today for me to fill.  He continues to complain of mild short-term memory difficulties but on Mini-Mental status exam today his score 27/30 which is slight improvement from last visit   REVIEW OF SYSTEMS: Full 14 system review of systems performed and notable only for:  Fatigue, Diarrhea, weakness, walking difficulty   ALLERGIES: Allergies  Allergen Reactions  . Amoxicillin Diarrhea  . Dilaudid [Hydromorphone Hcl] Other (See Comments)    DELUSIONS  . Lactose Intolerance (Gi) Diarrhea  . Sulfa Antibiotics Other (See Comments)    Cant control bladder    HOME MEDICATIONS: Outpatient Prescriptions Prior to Visit  Medication Sig  Dispense Refill  . Ascorbic Acid (VITAMIN C PO) Take 1 tablet by mouth daily.       Marland Kitchen atorvastatin (LIPITOR) 40 MG tablet Take 1 tablet (40 mg total) by mouth at bedtime.  30 tablet  1  . cetirizine  (ZYRTEC) 10 MG tablet Take 10 mg by mouth daily.      . cholecalciferol (VITAMIN D) 1000 UNITS tablet Take 1,000 Units by mouth daily.      . clopidogrel (PLAVIX) 75 MG tablet Take 1 tablet (75 mg total) by mouth at bedtime.  30 tablet  1  . docusate sodium 100 MG CAPS Take 100 mg by mouth 2 (two) times daily.  10 capsule  0  . donepezil (ARICEPT) 10 MG tablet Take 1 tablet (10 mg total) by mouth at bedtime.  90 tablet  1  . fesoterodine (TOVIAZ) 8 MG TB24 tablet Take 1 tablet (8 mg total) by mouth daily.  30 tablet  1  . fluticasone (FLONASE) 50 MCG/ACT nasal spray Place 2 sprays into both nostrils daily.       Marland Kitchen HYDROcodone-acetaminophen (NORCO) 5-325 MG per tablet Take 1 tablet by mouth every 6 (six) hours as needed for moderate pain.  15 tablet  0  . levETIRAcetam (KEPPRA) 750 MG tablet Take 1 tablet (750 mg total) by mouth every 12 (twelve) hours.  60 tablet  1  . Omega-3 Fatty Acids (FISH OIL PO) Take 1 capsule by mouth daily.      Marland Kitchen PHENobarbital (LUMINAL) 97.2 MG tablet Take 1 tablet (97.2 mg total) by mouth at bedtime.  90 tablet  3  . acetaminophen (TYLENOL) 325 MG tablet Take 2 tablets (650 mg total) by mouth 3 (three) times daily.  30 tablet  0  . bisacodyl (DULCOLAX) 10 MG suppository Place 1 suppository (10 mg total) rectally daily as needed for moderate constipation.  12 suppository  0  . naphazoline (NAPHCON) 0.1 % ophthalmic solution Place 1 drop into both eyes 4 (four) times daily as needed for irritation.  15 mL  0   No facility-administered medications prior to visit.     PHYSICAL EXAM  There were no vitals filed for this visit. There is no weight on file to calculate BMI.  Generalized: Well developed, in no acute distress  Head: normocephalic and atraumatic. Oropharynx benign  Neck: Supple, no carotid bruits  Cardiac: Regular rate rhythm, no murmur  Musculoskeletal: No deformity   Neurological examination  Mental Status: Awake and fully alert. Oriented to place and  time. Recent and remote memory intact. Attention span, concentration and fund of knowledge appropriate. Mood and affect appropriate. Mini-Mental status exam 26/30 with deficits in 3 step commands and copying a figure. Animal fluency test could 14. Clock drawing 4/4.  Cranial Nerves: Fundoscopic exam not done. Pupils equal, briskly reactive to light. Extraocular movements full without nystagmus. Visual fields full to confrontation. Hearing intact. Facial sensation intact. Face, tongue, palate moves normally and symmetrically.  Motor: Normal bulk and tone. Normal strength in all tested extremity muscles. Diminished fine finger movements on the left. Orbits right over left upper extremity. Tone is slightly increased in both legs left more than right. Sensory: intact to touch Coordination: Rapid alternating movements normal in all extremities. Finger-to-nose and heel-to-shin performed accurately bilaterally.  Gait and Station: Gait not tested, in wheelchair. Reflexes: 2+ and symmetric.   DIAGNOSTIC DATA (LABS, IMAGING, TESTING) - I reviewed patient records, labs, notes, testing and imaging myself where available.  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 Recent fall in February resulted in left broken hip with extended hospital stay and rehab.  He is home again, with orders to continue outpatient therapy.    PLAN: I had a long discussion with the patient and wife regarding his frequent falls, balance and memory difficulties and answered questions.  Continue Aricept to 10  mg daily. Provided Rx for Raised toilet seat with handles.  Continue Keppra 750 twice daily and phenobarbital 97.2 mg at night for seizures. He was counseled about fall prevention and gait instability. Follow up in 6 months or call earlier if necessary.   Philmore Pali, MSN, NP-C 05/21/2013, 3:20 PM [facility] [address], [place], [place] [address] [phone]  Note: This document was prepared with digital dictation and possible smart phrase technology. Any transcriptional errors that result from this process are unintentional.

## 2013-05-25 ENCOUNTER — Ambulatory Visit: Payer: Medicare Other | Attending: Orthopedic Surgery | Admitting: Physical Therapy

## 2013-05-25 DIAGNOSIS — R269 Unspecified abnormalities of gait and mobility: Secondary | ICD-10-CM | POA: Insufficient documentation

## 2013-05-25 DIAGNOSIS — Z5189 Encounter for other specified aftercare: Secondary | ICD-10-CM | POA: Insufficient documentation

## 2013-05-25 DIAGNOSIS — R293 Abnormal posture: Secondary | ICD-10-CM | POA: Insufficient documentation

## 2013-05-25 DIAGNOSIS — M6281 Muscle weakness (generalized): Secondary | ICD-10-CM | POA: Insufficient documentation

## 2013-05-25 DIAGNOSIS — R279 Unspecified lack of coordination: Secondary | ICD-10-CM | POA: Insufficient documentation

## 2013-05-25 DIAGNOSIS — R4189 Other symptoms and signs involving cognitive functions and awareness: Secondary | ICD-10-CM | POA: Insufficient documentation

## 2013-05-26 ENCOUNTER — Other Ambulatory Visit: Payer: Self-pay

## 2013-05-26 MED ORDER — CLOPIDOGREL BISULFATE 75 MG PO TABS: 75.0000 mg | ORAL_TABLET | Freq: Every day | ORAL | Status: AC

## 2013-05-26 MED ORDER — LEVETIRACETAM 750 MG PO TABS: 750.0000 mg | ORAL_TABLET | Freq: Two times a day (BID) | ORAL | Status: DC

## 2013-05-26 MED ORDER — DONEPEZIL HCL 10 MG PO TABS: 10.0000 mg | ORAL_TABLET | Freq: Every day | ORAL | Status: AC

## 2013-05-26 MED ORDER — PHENOBARBITAL 97.2 MG PO TABS: 97.2000 mg | ORAL_TABLET | Freq: Every day | ORAL | Status: AC

## 2013-05-26 NOTE — Telephone Encounter (Signed)
Rx signed and faxed.

## 2013-05-29 ENCOUNTER — Ambulatory Visit: Payer: Medicare Other

## 2013-06-01 ENCOUNTER — Ambulatory Visit: Payer: Medicare Other | Admitting: Physical Therapy

## 2013-06-02 ENCOUNTER — Ambulatory Visit: Payer: Medicare Other | Admitting: Physical Therapy

## 2013-06-04 ENCOUNTER — Ambulatory Visit: Payer: Medicare Other | Attending: Orthopedic Surgery | Admitting: Physical Therapy

## 2013-06-04 DIAGNOSIS — R293 Abnormal posture: Secondary | ICD-10-CM | POA: Insufficient documentation

## 2013-06-04 DIAGNOSIS — Z5189 Encounter for other specified aftercare: Secondary | ICD-10-CM | POA: Insufficient documentation

## 2013-06-04 DIAGNOSIS — R269 Unspecified abnormalities of gait and mobility: Secondary | ICD-10-CM | POA: Insufficient documentation

## 2013-06-04 DIAGNOSIS — R4189 Other symptoms and signs involving cognitive functions and awareness: Secondary | ICD-10-CM | POA: Insufficient documentation

## 2013-06-04 DIAGNOSIS — R279 Unspecified lack of coordination: Secondary | ICD-10-CM | POA: Insufficient documentation

## 2013-06-04 DIAGNOSIS — M6281 Muscle weakness (generalized): Secondary | ICD-10-CM | POA: Insufficient documentation

## 2013-06-10 ENCOUNTER — Encounter: Payer: Medicare Other | Admitting: Occupational Therapy

## 2013-06-16 ENCOUNTER — Ambulatory Visit: Payer: Medicare Other | Admitting: Physical Therapy

## 2013-06-18 ENCOUNTER — Ambulatory Visit: Payer: Medicare Other | Admitting: Physical Therapy

## 2013-06-23 ENCOUNTER — Ambulatory Visit: Payer: Medicare Other | Admitting: Physical Therapy

## 2013-06-25 ENCOUNTER — Ambulatory Visit: Payer: Medicare Other | Admitting: Physical Therapy

## 2013-06-30 ENCOUNTER — Ambulatory Visit: Payer: Medicare Other | Admitting: Physical Therapy

## 2013-07-02 ENCOUNTER — Ambulatory Visit: Payer: Medicare Other | Admitting: Physical Therapy

## 2013-07-07 ENCOUNTER — Ambulatory Visit: Payer: Medicare Other | Attending: Orthopedic Surgery | Admitting: Physical Therapy

## 2013-07-07 DIAGNOSIS — R4189 Other symptoms and signs involving cognitive functions and awareness: Secondary | ICD-10-CM | POA: Insufficient documentation

## 2013-07-07 DIAGNOSIS — Z5189 Encounter for other specified aftercare: Secondary | ICD-10-CM | POA: Insufficient documentation

## 2013-07-07 DIAGNOSIS — R269 Unspecified abnormalities of gait and mobility: Secondary | ICD-10-CM | POA: Insufficient documentation

## 2013-07-07 DIAGNOSIS — R279 Unspecified lack of coordination: Secondary | ICD-10-CM | POA: Insufficient documentation

## 2013-07-07 DIAGNOSIS — M6281 Muscle weakness (generalized): Secondary | ICD-10-CM | POA: Insufficient documentation

## 2013-07-07 DIAGNOSIS — R293 Abnormal posture: Secondary | ICD-10-CM | POA: Insufficient documentation

## 2013-07-09 ENCOUNTER — Ambulatory Visit: Payer: Medicare Other | Admitting: Physical Therapy

## 2013-07-14 ENCOUNTER — Ambulatory Visit: Payer: Medicare Other | Admitting: Physical Therapy

## 2013-07-16 ENCOUNTER — Ambulatory Visit: Payer: Medicare Other | Admitting: Physical Therapy

## 2013-07-21 ENCOUNTER — Ambulatory Visit: Payer: Medicare Other | Admitting: Physical Therapy

## 2013-07-23 ENCOUNTER — Ambulatory Visit: Payer: Medicare Other | Admitting: Physical Therapy

## 2013-07-28 ENCOUNTER — Ambulatory Visit: Payer: Medicare Other | Admitting: Physical Therapy

## 2013-07-30 ENCOUNTER — Ambulatory Visit: Payer: Medicare Other | Admitting: Physical Therapy

## 2013-08-03 ENCOUNTER — Ambulatory Visit: Payer: Medicare Other | Attending: Orthopedic Surgery | Admitting: Physical Therapy

## 2013-08-03 DIAGNOSIS — R269 Unspecified abnormalities of gait and mobility: Secondary | ICD-10-CM | POA: Insufficient documentation

## 2013-08-03 DIAGNOSIS — Z5189 Encounter for other specified aftercare: Secondary | ICD-10-CM | POA: Insufficient documentation

## 2013-08-03 DIAGNOSIS — R279 Unspecified lack of coordination: Secondary | ICD-10-CM | POA: Insufficient documentation

## 2013-08-03 DIAGNOSIS — M6281 Muscle weakness (generalized): Secondary | ICD-10-CM | POA: Insufficient documentation

## 2013-08-03 DIAGNOSIS — R4189 Other symptoms and signs involving cognitive functions and awareness: Secondary | ICD-10-CM | POA: Insufficient documentation

## 2013-08-03 DIAGNOSIS — R293 Abnormal posture: Secondary | ICD-10-CM | POA: Insufficient documentation

## 2013-08-06 ENCOUNTER — Ambulatory Visit: Payer: Medicare Other | Admitting: Physical Therapy

## 2013-08-11 ENCOUNTER — Ambulatory Visit: Payer: Medicare Other | Admitting: Physical Therapy

## 2013-08-12 ENCOUNTER — Other Ambulatory Visit: Payer: Self-pay | Admitting: Nurse Practitioner

## 2013-08-12 MED ORDER — LEVETIRACETAM 750 MG PO TABS: 750.0000 mg | ORAL_TABLET | Freq: Two times a day (BID) | ORAL | Status: AC

## 2013-08-13 ENCOUNTER — Ambulatory Visit: Payer: Medicare Other | Admitting: Physical Therapy

## 2013-08-18 ENCOUNTER — Ambulatory Visit: Payer: Medicare Other | Admitting: Physical Therapy

## 2013-08-18 ENCOUNTER — Other Ambulatory Visit: Payer: Self-pay | Admitting: Dermatology

## 2013-08-20 ENCOUNTER — Ambulatory Visit: Payer: Medicare Other | Admitting: Physical Therapy

## 2013-10-05 ENCOUNTER — Other Ambulatory Visit: Payer: Self-pay | Admitting: Nurse Practitioner

## 2013-10-05 ENCOUNTER — Telehealth: Payer: Self-pay | Admitting: Nurse Practitioner

## 2013-10-05 DIAGNOSIS — R4701 Aphasia: Secondary | ICD-10-CM

## 2013-10-05 DIAGNOSIS — R413 Other amnesia: Secondary | ICD-10-CM

## 2013-10-05 DIAGNOSIS — G4733 Obstructive sleep apnea (adult) (pediatric): Secondary | ICD-10-CM

## 2013-10-05 DIAGNOSIS — R4781 Slurred speech: Secondary | ICD-10-CM

## 2013-10-05 DIAGNOSIS — R569 Unspecified convulsions: Secondary | ICD-10-CM

## 2013-10-05 DIAGNOSIS — R270 Ataxia, unspecified: Secondary | ICD-10-CM

## 2013-10-05 DIAGNOSIS — I6350 Cerebral infarction due to unspecified occlusion or stenosis of unspecified cerebral artery: Secondary | ICD-10-CM

## 2013-10-05 DIAGNOSIS — R531 Weakness: Secondary | ICD-10-CM

## 2013-10-05 DIAGNOSIS — R269 Unspecified abnormalities of gait and mobility: Secondary | ICD-10-CM

## 2013-10-05 DIAGNOSIS — Z85841 Personal history of malignant neoplasm of brain: Secondary | ICD-10-CM

## 2013-10-05 DIAGNOSIS — R262 Difficulty in walking, not elsewhere classified: Secondary | ICD-10-CM

## 2013-10-05 DIAGNOSIS — Z87898 Personal history of other specified conditions: Secondary | ICD-10-CM

## 2013-10-05 DIAGNOSIS — R296 Repeated falls: Secondary | ICD-10-CM

## 2013-10-05 NOTE — Telephone Encounter (Signed)
Patient's spouse requesting referral to Neurologist in [place].  Dr. Mechele Claude, fax # [phone].  Can return call anytime and may leave message if not available.  Thanks

## 2013-10-05 NOTE — Telephone Encounter (Signed)
Please advise previous note. Thanks  °

## 2013-10-05 NOTE — Telephone Encounter (Signed)
I have entered the order for the referral, please fax office notes to Dr. Adah Salvage office.

## 2013-10-06 NOTE — Telephone Encounter (Signed)
Called pt and spoke with pt's wife Francesca Jewett to inform her that Dr. Mechele Claude is no longer with the Neurology practice that they wanted the referral to go to. Wife stated that she would still like for the referral to be sent to this practice, [facility] phone # [phone] and fax # [phone]. I advised the wife that if the pt has any other problems, questions or concerns to call the office. Wife verbalized understanding. FYI

## 2013-11-23 ENCOUNTER — Ambulatory Visit: Payer: Medicare Other | Admitting: Nurse Practitioner

## 2014-09-24 ENCOUNTER — Encounter: Payer: Self-pay | Admitting: Cardiovascular Disease

## 2014-10-04 DEATH — deceased

## 2015-06-10 IMAGING — CR DG CHEST 2V
1 series · 1 of 1 positions shown · non-contrast
Comparison: 06/07/2012

CLINICAL DATA: Traumatic injury with pain

EXAM:
CHEST  2 VIEW

[view not recorded]
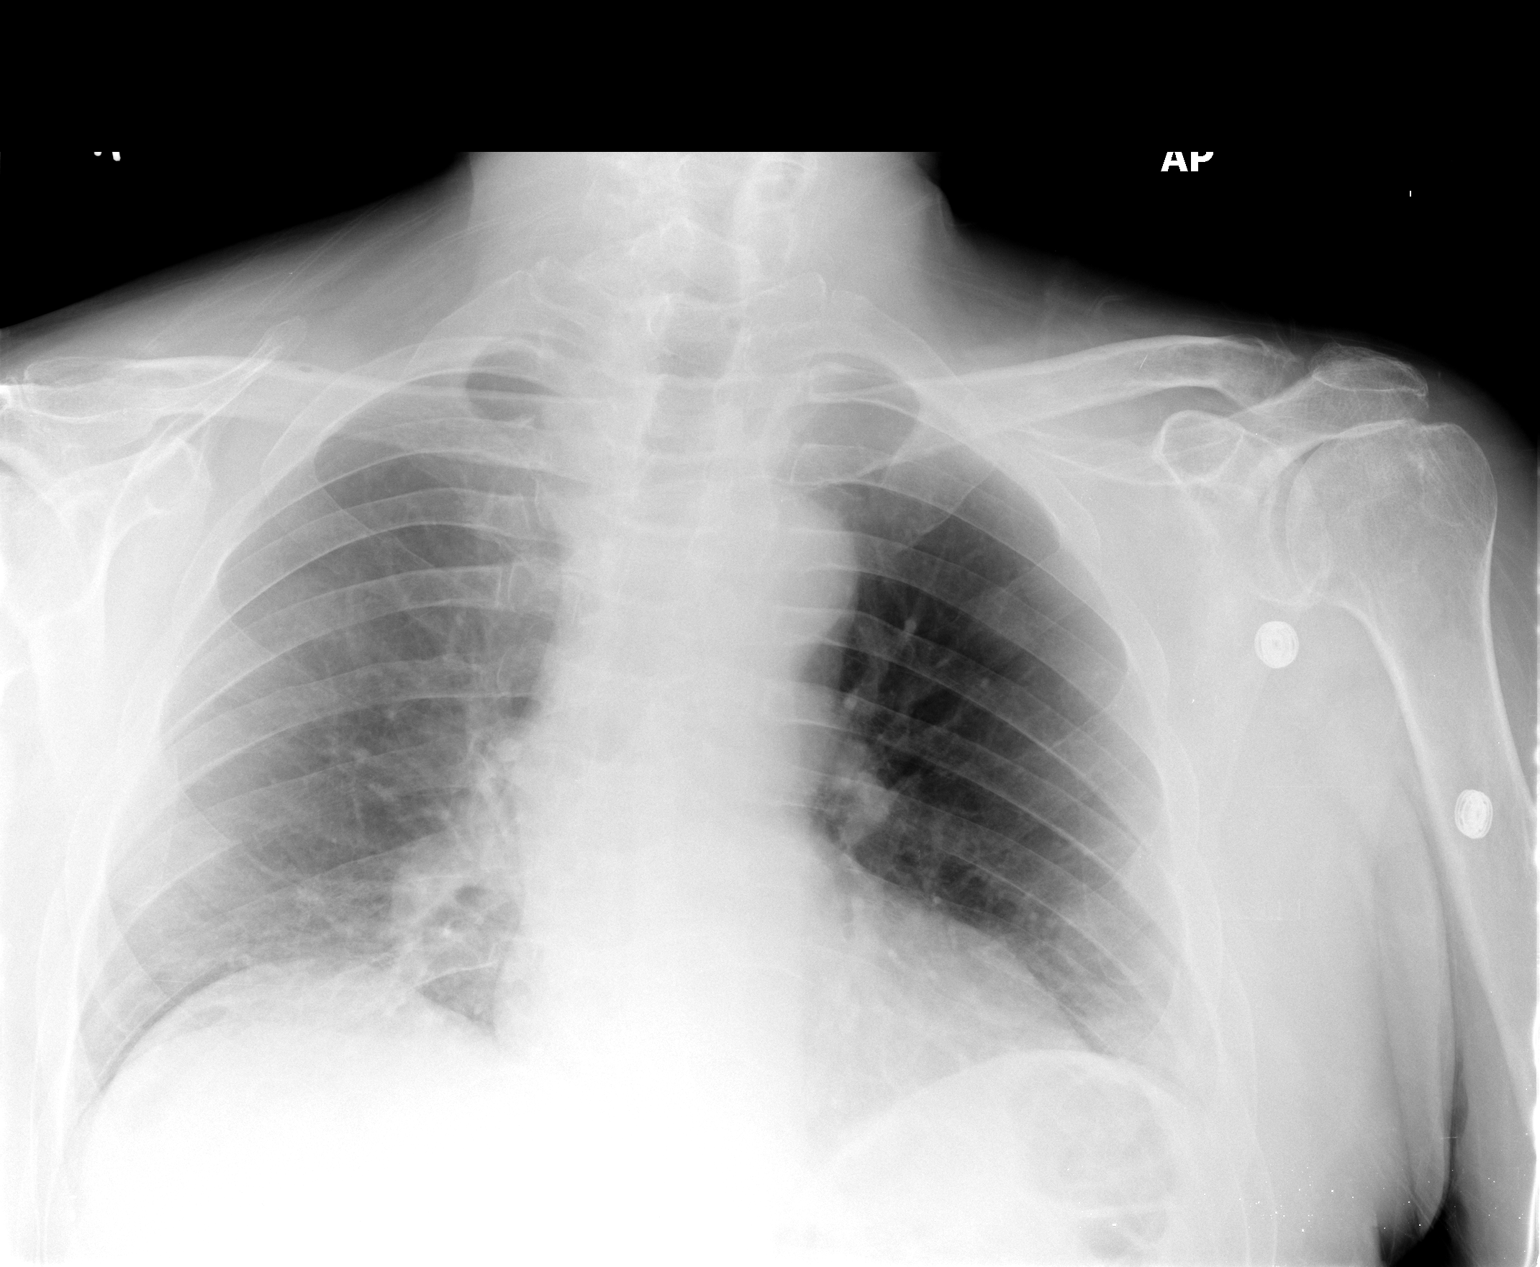

[1 of 1 positions shown; findings below may reference images not displayed]

FINDINGS: Cardiac shadow is stable. The lungs are well aerated with mild
bibasilar atelectasis. No acute bony abnormality is seen.
IMPRESSION: Mild bibasilar atelectasis.

## 2015-06-11 IMAGING — CR DG CHEST 1V
1 series · 1 of 1 positions shown · non-contrast
Comparison: 01/14/2013

CLINICAL DATA: Fever.  Mental status changes.

EXAM:
CHEST - 1 VIEW

[x chest ap]
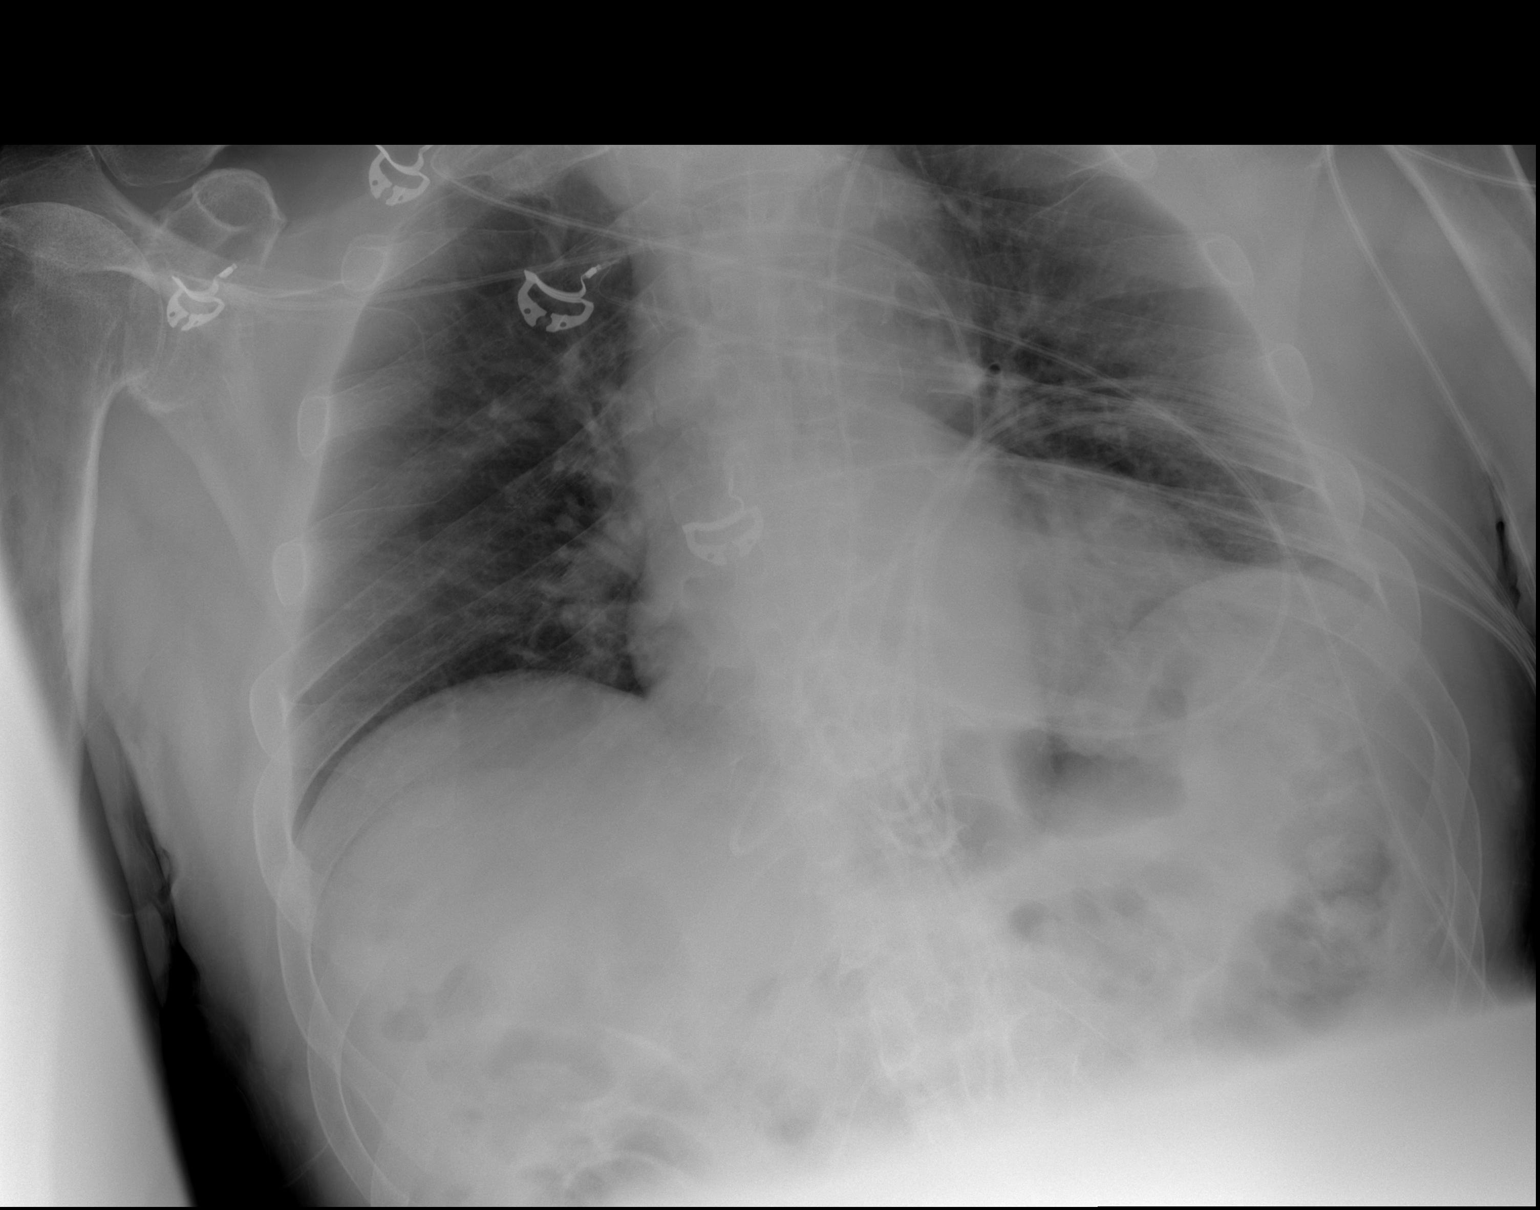

[1 of 1 positions shown; findings below may reference images not displayed]

FINDINGS: Midline trachea. Normal heart size. Numerous leads and wires project
over the chest. No pleural effusion or pneumothorax. Mild low low
lung volumes. Improved to resolved bibasilar atelectasis. Mild right
hemidiaphragm elevation.
IMPRESSION: Low lung volumes without acute disease.

## 2015-06-11 IMAGING — CT CT HEAD W/O CM
2 series · 15 of 30 positions shown, 19 images · non-contrast
Comparison: 01/14/2013

CLINICAL DATA: Bilateral leg weakness.

EXAM:
CT HEAD WITHOUT CONTRAST
TECHNIQUE: Contiguous axial images were obtained from the base of the skull
through the vertex without intravenous contrast.

[Series 2: head w/o · axial · non-contrast · 0.44mm/px · z∈[+135,+270]mm · 13 of 33 slices shown, 17 images]
[im 3/33  brain]
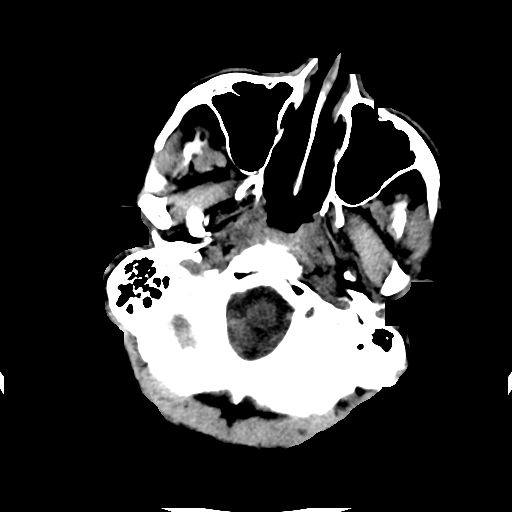
[im 3/33  bone]
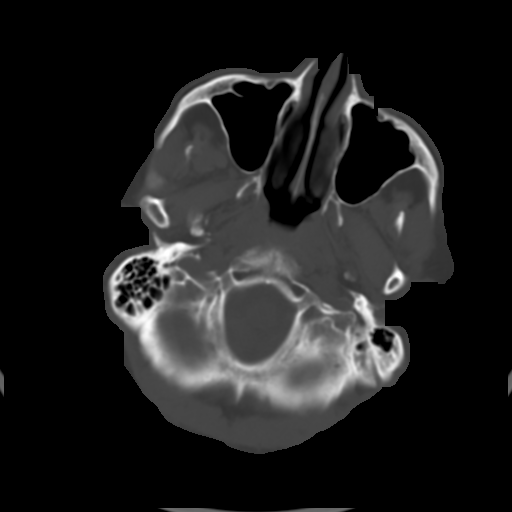
[im 5/33  brain]
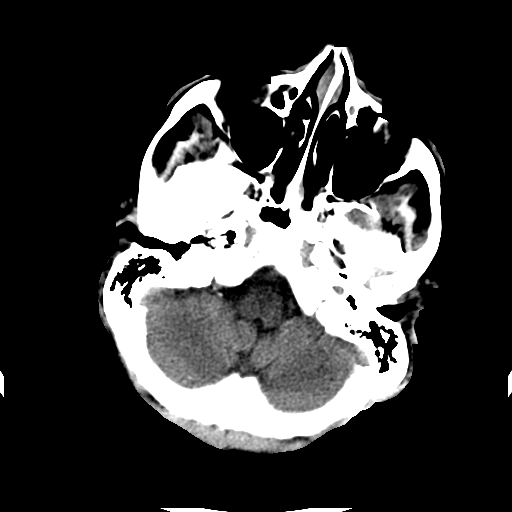
[im 7/33  brain]
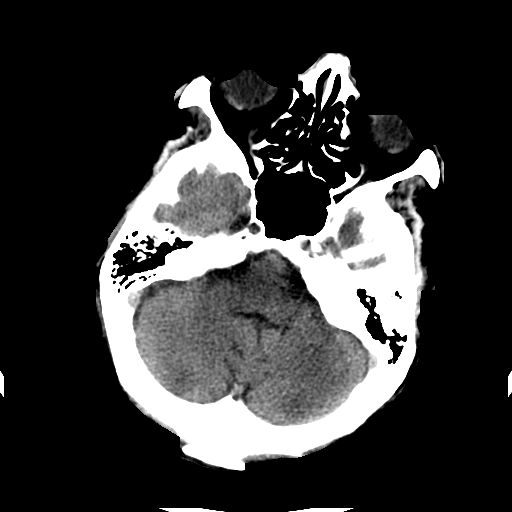
[im 10/33  brain]
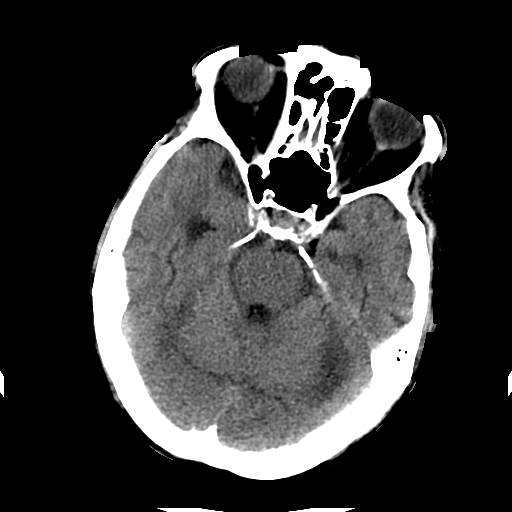
[im 12/33  brain]
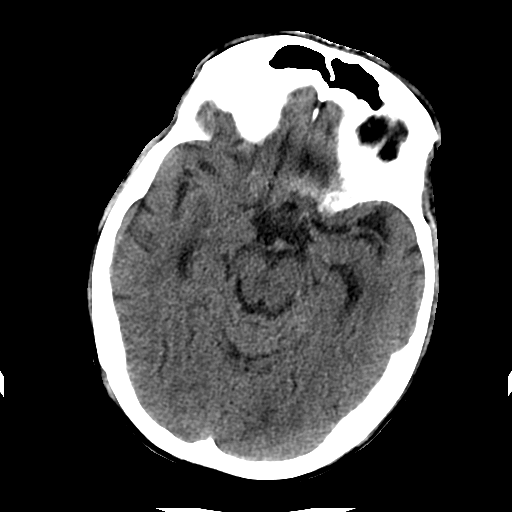
[im 12/33  bone]
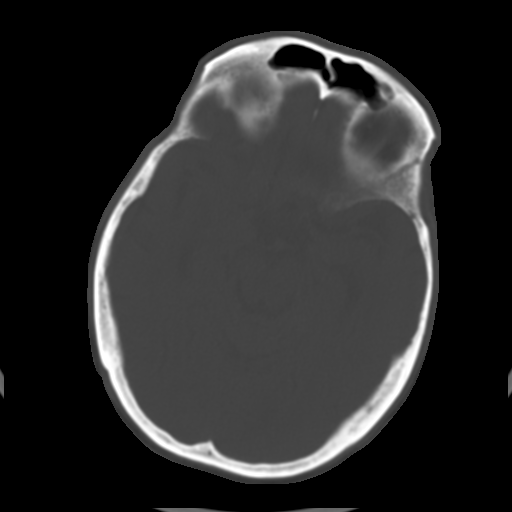
[im 14/33  brain]
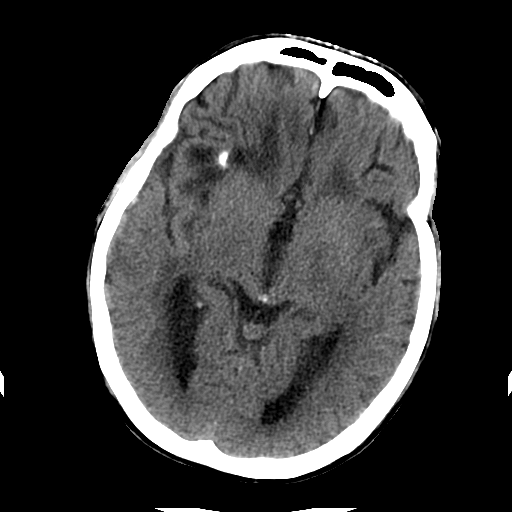
[im 17/33  brain]
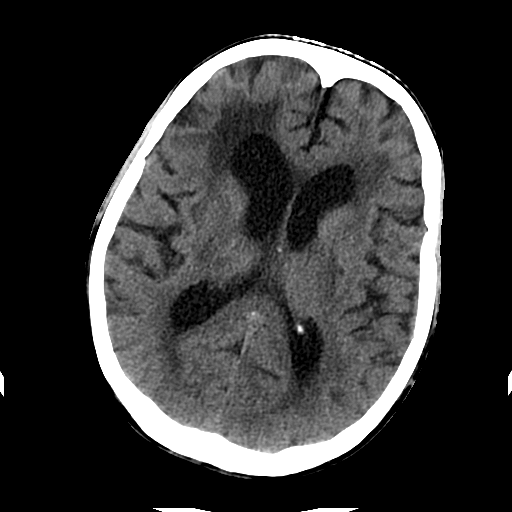
[im 19/33  brain]
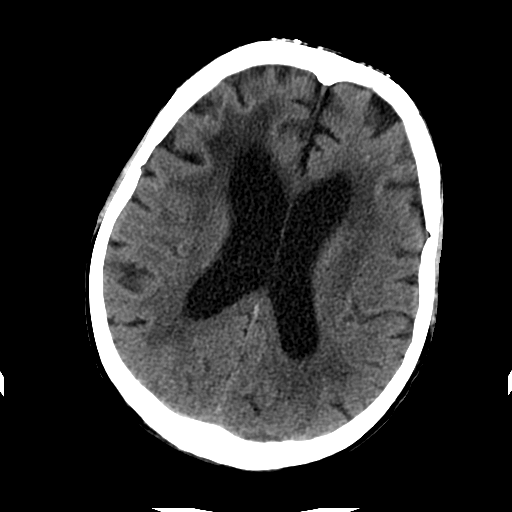
[im 21/33  brain]
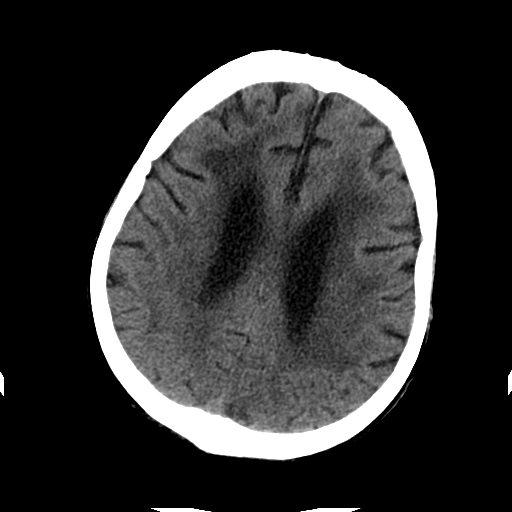
[im 21/33  bone]
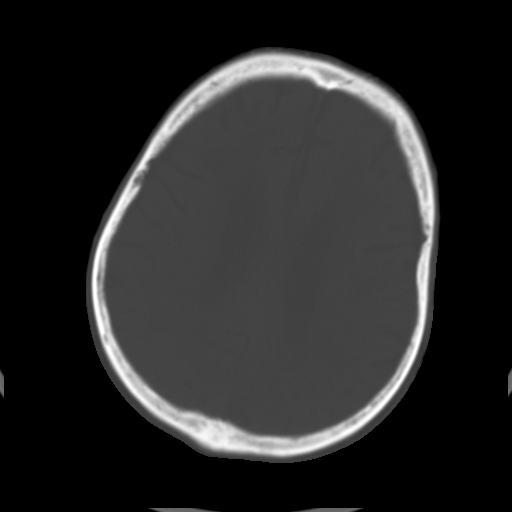
[im 23/33  brain]
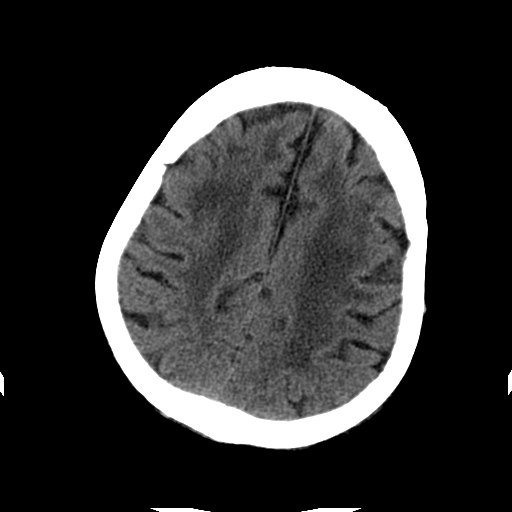
[im 26/33  brain]
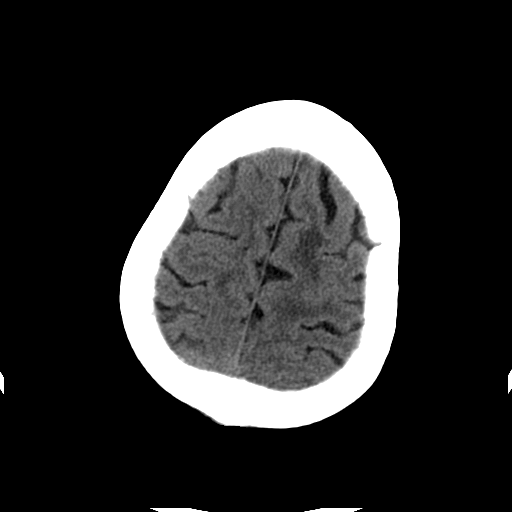
[im 28/33  brain]
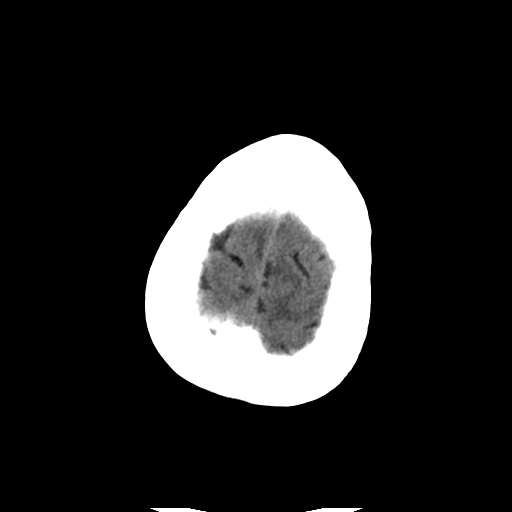
[im 30/33  brain]
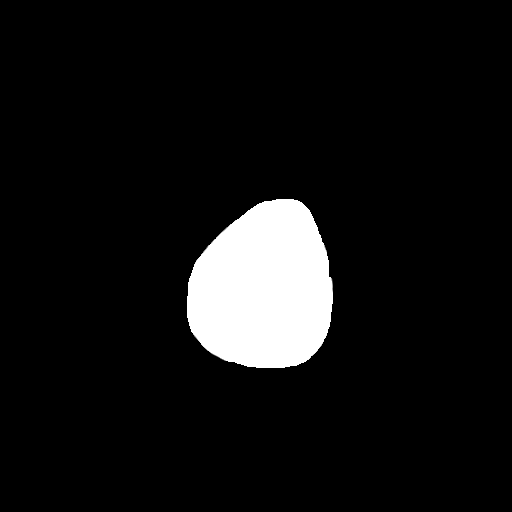
[im 30/33  bone]
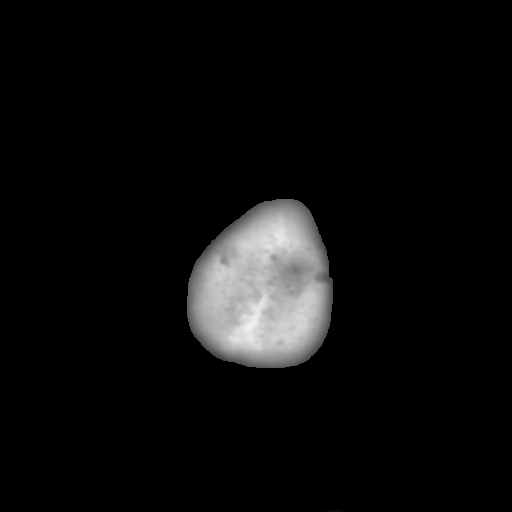

[Series 3: head w/o bone · axial · non-contrast · 0.44mm/px · z∈[+135,+155]mm · 2 of 33 slices shown]
[im 3/33  bone]
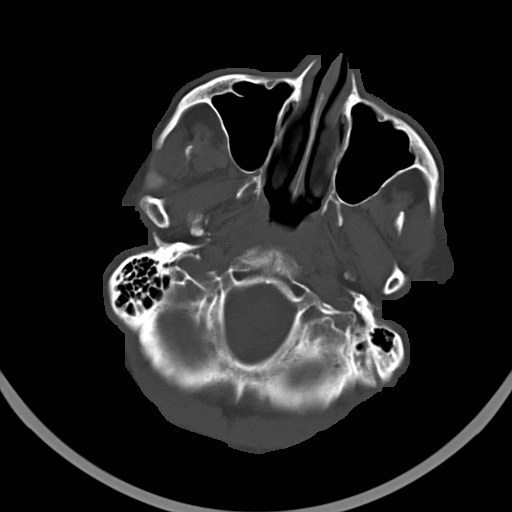
[im 7/33  bone]
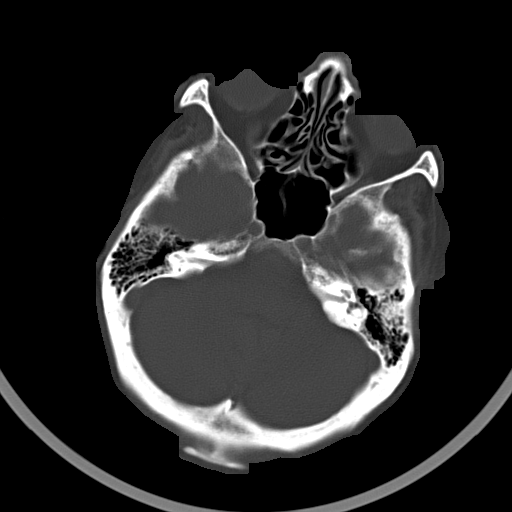

[15 of 30 positions shown; findings below may reference images not displayed]

FINDINGS: Extensive chronic microvascular disease throughout the deep white
matter. Old right MCA infarct in the region of the insular cortex.
Underlying parenchymal calcifications. No acute intracranial
abnormality. Specifically, no hemorrhage, hydrocephalus, mass
lesion, acute infarction, or significant intracranial injury. No
acute calvarial abnormality.

Mottled appearance throughout the calvarium with numerous small
hypodensities. This may be related to bone
demineralization/osteoporosis, but cannot completely exclude a more
aggressive process such as myeloma. Recommend clinical correlation.
Paranasal sinuses are clear.
IMPRESSION: Old right MCA infarct.

Extensive chronic microvascular changes.

Mottled appearance with tiny innumerable hypodensities throughout
the calvarium, question osteoporosis or a more aggressive process
such as myeloma. Recommend clinical correlation.

No acute intracranial abnormality.
